# Patient Record
Sex: Male | Born: 1955
Health system: Southern US, Community
[De-identification: ages and names within clinical notes are randomized; demographics above are authoritative.]

## PROBLEM LIST (undated history)

## (undated) DIAGNOSIS — C801 Malignant (primary) neoplasm, unspecified: Secondary | ICD-10-CM

## (undated) DIAGNOSIS — I10 Essential (primary) hypertension: Secondary | ICD-10-CM

## (undated) DIAGNOSIS — I2510 Atherosclerotic heart disease of native coronary artery without angina pectoris: Secondary | ICD-10-CM

---

## 2007-02-25 ENCOUNTER — Ambulatory Visit: Payer: Self-pay | Admitting: Internal Medicine

## 2007-04-11 ENCOUNTER — Ambulatory Visit: Payer: Self-pay | Admitting: Internal Medicine

## 2007-04-12 ENCOUNTER — Inpatient Hospital Stay: Payer: Self-pay | Admitting: Internal Medicine

## 2007-04-13 ENCOUNTER — Other Ambulatory Visit: Payer: Self-pay

## 2008-04-19 HISTORY — PX: CORONARY ARTERY BYPASS GRAFT: SHX141

## 2012-04-18 DIAGNOSIS — H47013 Ischemic optic neuropathy, bilateral: Secondary | ICD-10-CM | POA: Insufficient documentation

## 2014-01-24 DIAGNOSIS — I25810 Atherosclerosis of coronary artery bypass graft(s) without angina pectoris: Secondary | ICD-10-CM | POA: Insufficient documentation

## 2014-01-24 DIAGNOSIS — I38 Endocarditis, valve unspecified: Secondary | ICD-10-CM | POA: Insufficient documentation

## 2014-01-24 DIAGNOSIS — E785 Hyperlipidemia, unspecified: Secondary | ICD-10-CM | POA: Insufficient documentation

## 2014-01-24 DIAGNOSIS — I255 Ischemic cardiomyopathy: Secondary | ICD-10-CM | POA: Insufficient documentation

## 2014-01-24 DIAGNOSIS — E782 Mixed hyperlipidemia: Secondary | ICD-10-CM | POA: Insufficient documentation

## 2014-08-05 DIAGNOSIS — I10 Essential (primary) hypertension: Secondary | ICD-10-CM | POA: Insufficient documentation

## 2016-07-14 DIAGNOSIS — I25708 Atherosclerosis of coronary artery bypass graft(s), unspecified, with other forms of angina pectoris: Secondary | ICD-10-CM | POA: Diagnosis not present

## 2016-07-14 DIAGNOSIS — E782 Mixed hyperlipidemia: Secondary | ICD-10-CM | POA: Diagnosis not present

## 2016-07-14 DIAGNOSIS — I10 Essential (primary) hypertension: Secondary | ICD-10-CM | POA: Diagnosis not present

## 2016-07-29 DIAGNOSIS — I10 Essential (primary) hypertension: Secondary | ICD-10-CM | POA: Diagnosis not present

## 2016-07-29 DIAGNOSIS — Z79899 Other long term (current) drug therapy: Secondary | ICD-10-CM | POA: Diagnosis not present

## 2016-07-29 DIAGNOSIS — E782 Mixed hyperlipidemia: Secondary | ICD-10-CM | POA: Diagnosis not present

## 2016-08-03 DIAGNOSIS — E782 Mixed hyperlipidemia: Secondary | ICD-10-CM | POA: Diagnosis not present

## 2016-08-03 DIAGNOSIS — I10 Essential (primary) hypertension: Secondary | ICD-10-CM | POA: Diagnosis not present

## 2016-08-03 DIAGNOSIS — I25810 Atherosclerosis of coronary artery bypass graft(s) without angina pectoris: Secondary | ICD-10-CM | POA: Diagnosis not present

## 2017-01-12 DIAGNOSIS — I10 Essential (primary) hypertension: Secondary | ICD-10-CM | POA: Diagnosis not present

## 2017-01-12 DIAGNOSIS — I25810 Atherosclerosis of coronary artery bypass graft(s) without angina pectoris: Secondary | ICD-10-CM | POA: Diagnosis not present

## 2017-01-12 DIAGNOSIS — Z0000 Encounter for general adult medical examination without abnormal findings: Secondary | ICD-10-CM | POA: Diagnosis not present

## 2017-01-26 DIAGNOSIS — Z1211 Encounter for screening for malignant neoplasm of colon: Secondary | ICD-10-CM | POA: Diagnosis not present

## 2017-02-09 DIAGNOSIS — I10 Essential (primary) hypertension: Secondary | ICD-10-CM | POA: Diagnosis not present

## 2017-02-09 DIAGNOSIS — I25810 Atherosclerosis of coronary artery bypass graft(s) without angina pectoris: Secondary | ICD-10-CM | POA: Diagnosis not present

## 2017-02-09 DIAGNOSIS — I255 Ischemic cardiomyopathy: Secondary | ICD-10-CM | POA: Diagnosis not present

## 2017-02-11 DIAGNOSIS — Z79899 Other long term (current) drug therapy: Secondary | ICD-10-CM | POA: Diagnosis not present

## 2017-02-11 DIAGNOSIS — I10 Essential (primary) hypertension: Secondary | ICD-10-CM | POA: Diagnosis not present

## 2017-02-17 DIAGNOSIS — H527 Unspecified disorder of refraction: Secondary | ICD-10-CM | POA: Diagnosis not present

## 2017-02-17 DIAGNOSIS — H25093 Other age-related incipient cataract, bilateral: Secondary | ICD-10-CM | POA: Diagnosis not present

## 2017-02-17 DIAGNOSIS — H47013 Ischemic optic neuropathy, bilateral: Secondary | ICD-10-CM | POA: Diagnosis not present

## 2017-07-13 DIAGNOSIS — I10 Essential (primary) hypertension: Secondary | ICD-10-CM | POA: Diagnosis not present

## 2017-07-13 DIAGNOSIS — H47013 Ischemic optic neuropathy, bilateral: Secondary | ICD-10-CM | POA: Diagnosis not present

## 2017-07-13 DIAGNOSIS — I25810 Atherosclerosis of coronary artery bypass graft(s) without angina pectoris: Secondary | ICD-10-CM | POA: Diagnosis not present

## 2017-08-10 DIAGNOSIS — E782 Mixed hyperlipidemia: Secondary | ICD-10-CM | POA: Diagnosis not present

## 2017-08-10 DIAGNOSIS — Z79899 Other long term (current) drug therapy: Secondary | ICD-10-CM | POA: Diagnosis not present

## 2017-08-24 DIAGNOSIS — I10 Essential (primary) hypertension: Secondary | ICD-10-CM | POA: Diagnosis not present

## 2017-08-24 DIAGNOSIS — I25810 Atherosclerosis of coronary artery bypass graft(s) without angina pectoris: Secondary | ICD-10-CM | POA: Diagnosis not present

## 2017-08-24 DIAGNOSIS — I255 Ischemic cardiomyopathy: Secondary | ICD-10-CM | POA: Diagnosis not present

## 2018-01-18 DIAGNOSIS — Z0000 Encounter for general adult medical examination without abnormal findings: Secondary | ICD-10-CM | POA: Diagnosis not present

## 2018-01-18 DIAGNOSIS — I10 Essential (primary) hypertension: Secondary | ICD-10-CM | POA: Diagnosis not present

## 2018-01-18 DIAGNOSIS — I25810 Atherosclerosis of coronary artery bypass graft(s) without angina pectoris: Secondary | ICD-10-CM | POA: Diagnosis not present

## 2018-02-03 DIAGNOSIS — Z1211 Encounter for screening for malignant neoplasm of colon: Secondary | ICD-10-CM | POA: Diagnosis not present

## 2018-02-16 DIAGNOSIS — I10 Essential (primary) hypertension: Secondary | ICD-10-CM | POA: Diagnosis not present

## 2018-02-16 DIAGNOSIS — Z79899 Other long term (current) drug therapy: Secondary | ICD-10-CM | POA: Diagnosis not present

## 2018-02-21 DIAGNOSIS — E782 Mixed hyperlipidemia: Secondary | ICD-10-CM | POA: Diagnosis not present

## 2018-02-21 DIAGNOSIS — I10 Essential (primary) hypertension: Secondary | ICD-10-CM | POA: Diagnosis not present

## 2018-02-21 DIAGNOSIS — Z79899 Other long term (current) drug therapy: Secondary | ICD-10-CM | POA: Diagnosis not present

## 2018-02-23 DIAGNOSIS — H47013 Ischemic optic neuropathy, bilateral: Secondary | ICD-10-CM | POA: Diagnosis not present

## 2018-02-23 DIAGNOSIS — H2513 Age-related nuclear cataract, bilateral: Secondary | ICD-10-CM | POA: Diagnosis not present

## 2018-02-23 DIAGNOSIS — H47293 Other optic atrophy, bilateral: Secondary | ICD-10-CM | POA: Diagnosis not present

## 2018-08-16 DIAGNOSIS — E782 Mixed hyperlipidemia: Secondary | ICD-10-CM | POA: Diagnosis not present

## 2018-08-16 DIAGNOSIS — Z79899 Other long term (current) drug therapy: Secondary | ICD-10-CM | POA: Diagnosis not present

## 2018-08-23 DIAGNOSIS — I10 Essential (primary) hypertension: Secondary | ICD-10-CM | POA: Diagnosis not present

## 2018-08-23 DIAGNOSIS — I25810 Atherosclerosis of coronary artery bypass graft(s) without angina pectoris: Secondary | ICD-10-CM | POA: Diagnosis not present

## 2018-08-23 DIAGNOSIS — E782 Mixed hyperlipidemia: Secondary | ICD-10-CM | POA: Diagnosis not present

## 2018-08-24 ENCOUNTER — Other Ambulatory Visit: Payer: Self-pay | Admitting: Unknown Physician Specialty

## 2018-08-24 DIAGNOSIS — R221 Localized swelling, mass and lump, neck: Secondary | ICD-10-CM | POA: Diagnosis not present

## 2018-08-30 ENCOUNTER — Ambulatory Visit
Admission: RE | Admit: 2018-08-30 | Discharge: 2018-08-30 | Disposition: A | Discharge status: Home / Self Care | Payer: 59 | Source: Ambulatory Visit | Attending: Unknown Physician Specialty | Admitting: Unknown Physician Specialty

## 2018-08-30 ENCOUNTER — Other Ambulatory Visit: Payer: Self-pay

## 2018-08-30 DIAGNOSIS — R221 Localized swelling, mass and lump, neck: Secondary | ICD-10-CM | POA: Diagnosis present

## 2018-08-30 HISTORY — DX: Essential (primary) hypertension: I10

## 2018-08-30 MED ORDER — IOHEXOL 300 MG/ML  SOLN
75.0000 mL | Freq: Once | INTRAMUSCULAR | Status: AC | PRN
  Administered 2018-08-30: 75 mL via INTRAVENOUS

## 2018-08-31 ENCOUNTER — Other Ambulatory Visit: Payer: Self-pay | Admitting: Unknown Physician Specialty

## 2018-08-31 DIAGNOSIS — R221 Localized swelling, mass and lump, neck: Secondary | ICD-10-CM

## 2018-09-04 ENCOUNTER — Other Ambulatory Visit: Payer: Self-pay | Admitting: Student

## 2018-09-05 ENCOUNTER — Other Ambulatory Visit: Payer: Self-pay

## 2018-09-05 ENCOUNTER — Ambulatory Visit
Admission: RE | Admit: 2018-09-05 | Discharge: 2018-09-05 | Disposition: A | Discharge status: Home / Self Care | Payer: 59 | Source: Ambulatory Visit | Attending: Unknown Physician Specialty | Admitting: Unknown Physician Specialty

## 2018-09-05 ENCOUNTER — Other Ambulatory Visit: Payer: Self-pay | Admitting: Unknown Physician Specialty

## 2018-09-05 DIAGNOSIS — R221 Localized swelling, mass and lump, neck: Secondary | ICD-10-CM | POA: Diagnosis not present

## 2018-09-05 DIAGNOSIS — C770 Secondary and unspecified malignant neoplasm of lymph nodes of head, face and neck: Secondary | ICD-10-CM | POA: Diagnosis not present

## 2018-09-05 HISTORY — DX: Atherosclerotic heart disease of native coronary artery without angina pectoris: I25.10

## 2018-09-05 MED ORDER — SODIUM CHLORIDE 0.9 % IV SOLN: INTRAVENOUS | Status: DC

## 2018-09-05 NOTE — Procedures (Signed)
Interventional Radiology Procedure Note  Procedure: US guided FNA of left neck mass.  Multiple 25g bx, with cellular adequacy confirmed by cyto-tech. .  Complications: None  Recommendations:  - DC now - follow up path - routine wound care  Signed,  Dulcy Fanny. Earleen Newport, DO

## 2018-09-08 ENCOUNTER — Other Ambulatory Visit (HOSPITAL_COMMUNITY): Payer: Self-pay | Admitting: Unknown Physician Specialty

## 2018-09-08 ENCOUNTER — Other Ambulatory Visit: Payer: Self-pay | Admitting: Unknown Physician Specialty

## 2018-09-08 DIAGNOSIS — C4442 Squamous cell carcinoma of skin of scalp and neck: Secondary | ICD-10-CM

## 2018-09-12 LAB — CYTOLOGY - NON PAP

## 2018-09-14 ENCOUNTER — Encounter: Payer: Self-pay | Admitting: Oncology

## 2018-09-14 ENCOUNTER — Telehealth (HOSPITAL_COMMUNITY): Payer: Self-pay

## 2018-09-14 ENCOUNTER — Other Ambulatory Visit: Payer: Self-pay

## 2018-09-14 ENCOUNTER — Inpatient Hospital Stay: Payer: 59 | Attending: Oncology | Admitting: Oncology

## 2018-09-14 VITALS — BP 156/97 | HR 67 | Temp 98.7°F | Ht 69.0 in | Wt 219.0 lb

## 2018-09-14 DIAGNOSIS — C760 Malignant neoplasm of head, face and neck: Secondary | ICD-10-CM | POA: Diagnosis not present

## 2018-09-14 DIAGNOSIS — Z87891 Personal history of nicotine dependence: Secondary | ICD-10-CM

## 2018-09-14 DIAGNOSIS — I2510 Atherosclerotic heart disease of native coronary artery without angina pectoris: Secondary | ICD-10-CM | POA: Diagnosis not present

## 2018-09-14 DIAGNOSIS — Z8261 Family history of arthritis: Secondary | ICD-10-CM | POA: Diagnosis not present

## 2018-09-14 DIAGNOSIS — C4442 Squamous cell carcinoma of skin of scalp and neck: Secondary | ICD-10-CM

## 2018-09-14 DIAGNOSIS — Z808 Family history of malignant neoplasm of other organs or systems: Secondary | ICD-10-CM | POA: Insufficient documentation

## 2018-09-14 DIAGNOSIS — I700 Atherosclerosis of aorta: Secondary | ICD-10-CM | POA: Insufficient documentation

## 2018-09-14 DIAGNOSIS — Z79899 Other long term (current) drug therapy: Secondary | ICD-10-CM | POA: Diagnosis not present

## 2018-09-14 DIAGNOSIS — Z8249 Family history of ischemic heart disease and other diseases of the circulatory system: Secondary | ICD-10-CM | POA: Diagnosis not present

## 2018-09-14 DIAGNOSIS — I639 Cerebral infarction, unspecified: Secondary | ICD-10-CM | POA: Insufficient documentation

## 2018-09-14 NOTE — Progress Notes (Signed)
[place]  [phone] Fax:(336) [phone]  ID: [name] OB: [birth year]  MR#: [id number]  GUY#:[id number]  Patient Care Team: Idelle Crouch, MD as PCP - General (Internal Medicine)  CHIEF COMPLAINT: Squamous cell carcinoma of the head and neck.  INTERVAL HISTORY: Patient is a 63 year old male who recently noted enlarging lymph node on left side of his neck.  It did not resolve with antibiotics and he was subsequently sent to ENT for further evaluation.  Biopsy confirmed squamous cell carcinoma.  He is anxious, but otherwise feels well.  He denies any dysphasia.  He has no neurologic complaints.  He denies any recent fevers or illnesses.  He has a good appetite and denies weight loss.  He has no chest pain, shortness of breath, cough, or hemoptysis.  He denies any nausea, vomiting, constipation, or diarrhea.  He has no urinary complaints.  Patient otherwise feels well and offers no further specific complaints today.  REVIEW OF SYSTEMS:   Review of Systems  Constitutional: Negative.  Negative for fever, malaise/fatigue and weight loss.  Respiratory: Negative.  Negative for cough, hemoptysis and shortness of breath.   Cardiovascular: Negative.  Negative for leg swelling.  Gastrointestinal: Negative.  Negative for abdominal pain.  Genitourinary: Negative.  Negative for dysuria.  Musculoskeletal: Negative.  Negative for neck pain.  Skin: Negative.  Negative for rash.  Neurological: Negative.  Negative for dizziness, focal weakness, weakness and headaches.  Psychiatric/Behavioral: The patient is nervous/anxious.     As per HPI. Otherwise, a complete review of systems is negative.  PAST MEDICAL HISTORY: Past Medical History:  Diagnosis Date   Coronary artery disease    Hypertension     PAST SURGICAL HISTORY: Past Surgical History:  Procedure Laterality Date   CORONARY ARTERY BYPASS GRAFT  2010   Quadruple    FAMILY HISTORY: Family History   Problem Relation Age of Onset   Arthritis Mother    Heart attack Father    Brain cancer Sister     ADVANCED DIRECTIVES (Y/N):  N  HEALTH MAINTENANCE: Social History   Tobacco Use   Smoking status: Former Smoker    Types: Cigarettes    Last attempt to quit: 10/17/2008    Years since quitting: 9.9   Smokeless tobacco: Never Used  Substance Use Topics   Alcohol use: Yes    Comment: occasionally   Drug use: Never     Colonoscopy:  PAP:  Bone density:  Lipid panel:  No Known Allergies  Current Outpatient Medications  Medication Sig Dispense Refill   ascorbic acid (VITAMIN C) 1000 MG tablet Take 1 tablet by mouth.     aspirin 325 MG tablet Take 325 mg by mouth daily.     atorvastatin (LIPITOR) 80 MG tablet Take 1 tablet by mouth 1 day or 1 dose.     DENTA 5000 PLUS 1.1 % CREA dental cream BRUSH WITH PASTE 2 3 TIMES PER DAY FOR AT LEAST 1 MINUTE     lovastatin (MEVACOR) 40 MG tablet Take 40 mg by mouth at bedtime.     metoprolol succinate (TOPROL-XL) 25 MG 24 hr tablet Take 1 tablet by mouth 1 day or 1 dose.     olmesartan-hydrochlorothiazide (BENICAR HCT) 40-12.5 MG tablet Take 1 tablet by mouth 1 day or 1 dose.     No current facility-administered medications for this visit.     OBJECTIVE: Vitals:   09/14/18 1342  BP: (!) 156/97  Pulse: 67  Temp: 98.7 F (37.1  C)     Body mass index is 32.34 kg/m.    ECOG FS:0 - Asymptomatic  General: Well-developed, well-nourished, no acute distress. Eyes: Pink conjunctiva, anicteric sclera. HEENT: Normocephalic, moist mucous membranes, clear oropharnyx.  Easily palpable left cervical lymph node. Lungs: Clear to auscultation bilaterally. Heart: Regular rate and rhythm. No rubs, murmurs, or gallops. Abdomen: Soft, nontender, nondistended. No organomegaly noted, normoactive bowel sounds. Musculoskeletal: No edema, cyanosis, or clubbing. Neuro: Alert, answering all questions appropriately. Cranial nerves grossly  intact. Skin: No rashes or petechiae noted. Psych: Normal affect. Lymphatics: No calvicular, axillary or inguinal LAD.   LAB RESULTS:  No results found for: NA, K, CL, CO2, GLUCOSE, BUN, CREATININE, CALCIUM, PROT, ALBUMIN, AST, ALT, ALKPHOS, BILITOT, GFRNONAA, GFRAA  No results found for: WBC, NEUTROABS, HGB, HCT, MCV, PLT   STUDIES: Ct Soft Tissue Neck W Contrast  Result Date: 08/30/2018 CLINICAL DATA:  LEFT-sided neck mass. EXAM: CT NECK WITH CONTRAST TECHNIQUE: Multidetector CT imaging of the neck was performed using the standard protocol following the bolus administration of intravenous contrast. CONTRAST:  65mL OMNIPAQUE IOHEXOL 300 MG/ML  SOLN COMPARISON:  None. FINDINGS: Pharynx and larynx: Mass effect on the LEFT oropharynx/hypopharynx due to the large LEFT neck mass, but a mucosal lesion is not established. Normal larynx. Salivary glands: No inflammation, mass, or stone. Thyroid: Normal. Lymph nodes: Pathologically enlarged LEFT level 2A lymph node with central necrosis, 32 x 41 mm cross-section, series 2, image 56, punctate foci of calcification, most consistent with metastatic disease. Adjacent abnormal, but noncalcified, LEFT level III nodes measure 18 mm and 7 mm short axis, respectively. Vascular: Carotid bifurcation atherosclerosis, greater on the LEFT. Limited intracranial: Negative. Visualized orbits: Negative. Mastoids and visualized paranasal sinuses: No acute findings. Skeleton: Spondylosis, most pronounced at C5-6 and C6-7. Upper chest: Prior CABG. Aortic atherosclerosis. No lung mass or pneumothorax. No mediastinal adenopathy. Other: None. IMPRESSION: Pathologically enlarged LEFT level 2 neck lymph node with central necrosis, and punctate calcifications, 32 x 41 mm cross-section, most consistent with metastatic disease; statistically, squamous cell carcinoma. A primary pharyngeal lesion is not visible. Smaller noncalcified nodes are adjacent, LEFT level III. Punctate  calcifications are not typical for squamous cell carcinoma, more often seen in metastatic thyroid cancer; however no thyroid abnormality is identified. An unusual infection, such as TB or other granulomatous process could also have this appearance. Tissue sampling is warranted. Electronically Signed   By: Staci Righter M.D.   On: 08/30/2018 15:51   [place] Fna Salivary Gland/parotid Gland  Result Date: 09/05/2018 INDICATION: 63 year old male with a history of lymphadenopathy and neck mass with concern for head neck carcinoma EXAM: Ultrasound-guided biopsy left neck mass MEDICATIONS: None. ANESTHESIA/SEDATION: None FLUOROSCOPY TIME:  None COMPLICATIONS: None PROCEDURE: The procedure, risks, benefits, and alternatives were explained to the patient. Questions regarding the procedure were encouraged and answered. The patient understands and consents to the procedure. Ultrasound survey was performed with images stored and sent to PACs. The left neck was prepped with chlorhexidine in a sterile fashion, and a sterile drape was applied covering the operative field. A sterile gown and sterile gloves were used for the procedure. Local anesthesia was provided with 1% Lidocaine. Ultrasound guidance was used to infiltrate the region with 1% lidocaine for local anesthesia. Four separate 25 gauge fine needle biopsy were then acquired of the left neck mass using ultrasound guidance. Images were stored. Slide preparation was performed, and cellular adequacy was confirmed in the room by the cytotechnologist. Final image was stored after biopsy.  Patient tolerated the procedure well and remained hemodynamically stable throughout. No complications were encountered and no significant blood loss was encounter IMPRESSION: Status post ultrasound-guided biopsy of left neck mass. Signed, Dulcy Fanny. Dellia Nims, RPVI [facility] Radiology Electronically Signed   By: Corrie Mckusick D.O.   On:  09/05/2018 11:18    ASSESSMENT: Squamous cell carcinoma of the head and neck.  PLAN:    1. Squamous cell carcinoma of the head and neck: Pathology results reviewed confirming malignancy, but not enough tissue to assess p16 status.  Patient has agreed to a second ultrasound-guided biopsy.  CT scan results from Aug 30, 2018 reviewed independently and reported as above with enlarged cervical lymph node and no obvious primary.  PET scan is scheduled for September 18, 2018.  Patient will return to clinic in 1 week, after his PET scan and second biopsy, to discuss the results and treatment planning.  He will also have consultation with radiation oncology at that time.  Given his positive lymphadenopathy, patient will likely benefit from concurrent XRT along with weekly cisplatin.  I spent a total of 60 minutes face-to-face with the patient of which greater than 50% of the visit was spent in counseling and coordination of care as detailed above.   Patient expressed understanding and was in agreement with this plan. He also understands that He can call clinic at any time with any questions, concerns, or complaints.   Cancer Staging No matching staging information was found for the patient.  Lloyd Huger, MD   09/15/2018 9:47 AM

## 2018-09-14 NOTE — Progress Notes (Signed)
Patient is here today to establish care for squamous cell carcinoma of skin of scalp and neck, left. Patient is scheduled to have a PET scan on 09/18/2018. Patient stated that at the beginning of the year he had some swelling on his neck lymph nodes. Patient saw his PCP and was given antibiotics but things got better. However However, his left side of his neck the swelling did not go down. He was referred to ENT and had a CT Scan done and then a biopsy.

## 2018-09-15 ENCOUNTER — Other Ambulatory Visit: Payer: Self-pay | Admitting: Oncology

## 2018-09-15 DIAGNOSIS — C099 Malignant neoplasm of tonsil, unspecified: Secondary | ICD-10-CM | POA: Insufficient documentation

## 2018-09-15 NOTE — Progress Notes (Signed)
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   INTERVAL HISTORY: Patient returns to clinic today to discuss his imaging results and treatment planning.  He continues to be anxious, but otherwise feels well.  He denies any dysphasia.  He has no neurologic complaints.  He denies any recent fevers or illnesses.  He has a good appetite and denies weight loss.  He has no chest pain, shortness of breath, cough, or hemoptysis.  He denies any nausea, vomiting, constipation, or diarrhea.  He has no urinary complaints.  Patient offers no specific complaints today.  REVIEW OF SYSTEMS:   Review of Systems  Constitutional: Negative.  Negative for fever, malaise/fatigue and weight loss.  Respiratory: Negative.  Negative for cough, hemoptysis and shortness of breath.   Cardiovascular: Negative.  Negative for leg swelling.  Gastrointestinal: Negative.  Negative for abdominal pain.  Genitourinary: Negative.  Negative for dysuria.  Musculoskeletal: Negative.  Negative for neck pain.  Skin: Negative.  Negative for rash.  Neurological: Negative.  Negative for dizziness, focal weakness, weakness and headaches.  Psychiatric/Behavioral: The patient is nervous/anxious.     As per HPI. Otherwise, a complete review of systems is negative.  PAST MEDICAL HISTORY: Past Medical History:  Diagnosis Date   Coronary artery disease    Hypertension     PAST SURGICAL HISTORY: Past Surgical History:  Procedure Laterality Date   CORONARY ARTERY BYPASS GRAFT  2010   Quadruple    FAMILY HISTORY: Family History  Problem Relation Age of Onset   Arthritis Mother    Heart attack Father    Brain cancer Sister     ADVANCED DIRECTIVES (Y/N):  N  HEALTH  MAINTENANCE: Social History   Tobacco Use   Smoking status: Former Smoker    Types: Cigarettes    Last attempt to quit: 10/17/2008    Years since quitting: 9.9   Smokeless tobacco: Never Used  Substance Use Topics   Alcohol use: Yes    Comment: occasionally   Drug use: Never     Colonoscopy:  PAP:  Bone density:  Lipid panel:  No Known Allergies  Current Outpatient Medications  Medication Sig Dispense Refill   amLODipine (NORVASC) 10 MG tablet      ascorbic acid (VITAMIN C) 1000 MG tablet Take 1 tablet by mouth.     aspirin 325 MG tablet Take 325 mg by mouth daily.     atorvastatin (LIPITOR) 80 MG tablet Take 1 tablet by mouth 1 day or 1 dose.     DENTA 5000 PLUS 1.1 % CREA dental cream BRUSH WITH PASTE 2 3 TIMES PER DAY FOR AT LEAST 1 MINUTE     lovastatin (MEVACOR) 40 MG tablet Take 40 mg by mouth at bedtime.     metoprolol succinate (TOPROL-XL) 25 MG 24 hr tablet Take 1 tablet by mouth 1 day or 1 dose.     olmesartan-hydrochlorothiazide (BENICAR HCT) 40-12.5 MG tablet Take 1 tablet by mouth 1 day or 1 dose.     No current facility-administered medications for this visit.     OBJECTIVE: Vitals:   09/22/18 0941  BP: (!) 162/91  Pulse: (!) 56  Temp: (!) 97.5 F (36.4 C)     Body mass index is 32.49 kg/m.    ECOG FS:0 -  Asymptomatic  General: Well-developed, well-nourished, no acute distress. Eyes: Pink conjunctiva, anicteric sclera. HEENT: Normocephalic, moist mucous membranes, clear oropharnyx.  Easily palpable left cervical lymph node. Lungs: Clear to auscultation bilaterally. Heart: Regular rate and rhythm. No rubs, murmurs, or gallops. Abdomen: Soft, nontender, nondistended. No organomegaly noted, normoactive bowel sounds. Musculoskeletal: No edema, cyanosis, or clubbing. Neuro: Alert, answering all questions appropriately. Cranial nerves grossly intact. Skin: No rashes or petechiae noted. Psych: Normal affect.   LAB RESULTS:  No results found  for: NA, K, CL, CO2, GLUCOSE, BUN, CREATININE, CALCIUM, PROT, ALBUMIN, AST, ALT, ALKPHOS, BILITOT, GFRNONAA, GFRAA  No results found for: WBC, NEUTROABS, HGB, HCT, MCV, PLT   STUDIES: Ct Soft Tissue Neck W Contrast  Result Date: 08/30/2018 CLINICAL DATA:  LEFT-sided neck mass. EXAM: CT NECK WITH CONTRAST TECHNIQUE: Multidetector CT imaging of the neck was performed using the standard protocol following the bolus administration of intravenous contrast. CONTRAST:  44mL OMNIPAQUE IOHEXOL 300 MG/ML  SOLN COMPARISON:  None. FINDINGS: Pharynx and larynx: Mass effect on the LEFT oropharynx/hypopharynx due to the large LEFT neck mass, but a mucosal lesion is not established. Normal larynx. Salivary glands: No inflammation, mass, or stone. Thyroid: Normal. Lymph nodes: Pathologically enlarged LEFT level 2A lymph node with central necrosis, 32 x 41 mm cross-section, series 2, image 56, punctate foci of calcification, most consistent with metastatic disease. Adjacent abnormal, but noncalcified, LEFT level III nodes measure 18 mm and 7 mm short axis, respectively. Vascular: Carotid bifurcation atherosclerosis, greater on the LEFT. Limited intracranial: Negative. Visualized orbits: Negative. Mastoids and visualized paranasal sinuses: No acute findings. Skeleton: Spondylosis, most pronounced at C5-6 and C6-7. Upper chest: Prior CABG. Aortic atherosclerosis. No lung mass or pneumothorax. No mediastinal adenopathy. Other: None. IMPRESSION: Pathologically enlarged LEFT level 2 neck lymph node with central necrosis, and punctate calcifications, 32 x 41 mm cross-section, most consistent with metastatic disease; statistically, squamous cell carcinoma. A primary pharyngeal lesion is not visible. Smaller noncalcified nodes are adjacent, LEFT level III. Punctate calcifications are not typical for squamous cell carcinoma, more often seen in metastatic thyroid cancer; however no thyroid abnormality is identified. An unusual  infection, such as TB or other granulomatous process could also have this appearance. Tissue sampling is warranted. Electronically Signed   By: Staci Righter M.D.   On: 08/30/2018 15:51   Nm Pet Image Initial (pi) Skull Base To Thigh  Result Date: 09/18/2018 CLINICAL DATA:  Initial treatment strategy for LEFT neck mass. Squamous cell carcinoma EXAM: NUCLEAR MEDICINE PET SKULL BASE TO THIGH TECHNIQUE: 11.5 mCi F-18 FDG was injected intravenously. Full-ring PET imaging was performed from the skull base to thigh after the radiotracer. CT data was obtained and used for attenuation correction and anatomic localization. Fasting blood glucose: 117 mg/dl COMPARISON:  Neck CT 08/30/2018 FINDINGS: Mediastinal blood pool activity: SUV max Liver activity: SUV max NA NECK: Necrotic lymph node in the LEFT level 2 neck measures 3.3 by 3.7 cm. This node is intensely hypermetabolic along the medial border with SUV max equal 4.9. More inferiorly a second LEFT level 2 lymph node is solid measuring 14 mm with SUV max equal 9.1. THERE IS ASYMMETRIC HYPERMETABOLIC ACTIVITY IN THE POSTERIOR OROPHARYNX ACTIVITY LOCALIZES TO the region of the LEFT PALATINE TONSIL (IMAGE 36). ACTIVITY IS INTENSE WITH SUV MAX EQUAL 9.1. There is activity is same level within the RIGHT posterior oropharynx but less intense (SUV max equal 5.3). Activity of both these lesions is confined to the pharyngeal mucosa. NO HYPERMETABOLIC RIGHT CERVICAL  LYMPH NODES. Incidental CT findings: none CHEST: No hypermetabolic mediastinal or hilar nodes. No suspicious pulmonary nodules on the CT scan. Incidental CT findings: Post CABG ABDOMEN/PELVIS: No abnormal hypermetabolic activity within the liver, pancreas, adrenal glands, or spleen. No hypermetabolic lymph nodes in the abdomen or pelvis. Incidental CT findings: Atherosclerotic calcification of the aorta. Prostate normal SKELETON: No focal hypermetabolic activity to suggest skeletal metastasis. Incidental CT findings:  none IMPRESSION: 1. Two hypermetabolic lymph nodes in the LEFT level 2 cervical chain consistent with metastatic adenopathy. 2. Focus of intense asymmetric hypermetabolic activity in the LEFT posterior pharynx localizing to region of the palatine tonsil concerning for primary head neck carcinoma. Activity confined to the pharyngeal mucosa. Similar less intense activity at the same level in the RIGHT posterior pharynx is indeterminate. 3. No contralateral RIGHT cervical adenopathy. 4. No evidence distant metastatic disease. Electronically Signed   By: Suzy Bouchard M.D.   On: 09/18/2018 11:46   [place] Fna Salivary Gland/parotid Gland  Result Date: 09/05/2018 INDICATION: 63 year old male with a history of lymphadenopathy and neck mass with concern for head neck carcinoma EXAM: Ultrasound-guided biopsy left neck mass MEDICATIONS: None. ANESTHESIA/SEDATION: None FLUOROSCOPY TIME:  None COMPLICATIONS: None PROCEDURE: The procedure, risks, benefits, and alternatives were explained to the patient. Questions regarding the procedure were encouraged and answered. The patient understands and consents to the procedure. Ultrasound survey was performed with images stored and sent to PACs. The left neck was prepped with chlorhexidine in a sterile fashion, and a sterile drape was applied covering the operative field. A sterile gown and sterile gloves were used for the procedure. Local anesthesia was provided with 1% Lidocaine. Ultrasound guidance was used to infiltrate the region with 1% lidocaine for local anesthesia. Four separate 25 gauge fine needle biopsy were then acquired of the left neck mass using ultrasound guidance. Images were stored. Slide preparation was performed, and cellular adequacy was confirmed in the room by the cytotechnologist. Final image was stored after biopsy. Patient tolerated the procedure well and remained hemodynamically stable throughout. No complications were encountered and no significant blood  loss was encounter IMPRESSION: Status post ultrasound-guided biopsy of left neck mass. Signed, Dulcy Fanny. Dellia Nims, RPVI [facility] Radiology Electronically Signed   By: Corrie Mckusick D.O.   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   PLAN:    1. 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  Repeat biopsy to assess for p16 status is on September 25, 2018.  Patient was also evaluated by radiation oncology today.  Plan is to initiate concurrent chemotherapy with weekly cisplatin 40 mg/m along with daily XRT on approximately October 04, 2018.  Return to clinic on that date to initiate cycle 1 of weekly cisplatin.    I spent a total of 30 minutes face-to-face with the patient of which greater than 50% of the visit was spent in counseling and coordination of care as detailed above.   Patient expressed understanding and was in agreement with this plan. He also understands that He can call clinic at any time with any questions, concerns, or complaints.   Cancer Staging No matching staging information was found for the patient.  Lloyd Huger, MD   09/23/2018 10:15 AM

## 2018-09-18 ENCOUNTER — Other Ambulatory Visit: Payer: Self-pay

## 2018-09-18 ENCOUNTER — Ambulatory Visit
Admission: RE | Admit: 2018-09-18 | Discharge: 2018-09-18 | Disposition: A | Discharge status: Home / Self Care | Payer: 59 | Source: Ambulatory Visit | Attending: Unknown Physician Specialty | Admitting: Unknown Physician Specialty

## 2018-09-18 DIAGNOSIS — C4442 Squamous cell carcinoma of skin of scalp and neck: Secondary | ICD-10-CM | POA: Diagnosis present

## 2018-09-18 LAB — GLUCOSE, CAPILLARY: Glucose-Capillary: 117 mg/dL — ABNORMAL HIGH (ref 70–99)

## 2018-09-18 MED ORDER — FLUDEOXYGLUCOSE F - 18 (FDG) INJECTION
11.5100 | Freq: Once | INTRAVENOUS | Status: AC | PRN
  Administered 2018-09-18: 11.51 via INTRAVENOUS

## 2018-09-19 ENCOUNTER — Other Ambulatory Visit: Payer: Self-pay | Admitting: Oncology

## 2018-09-19 NOTE — Addendum Note (Signed)
Addended by: Wayna Chalet on: 09/19/2018 02:45 PM   Modules accepted: Orders

## 2018-09-21 ENCOUNTER — Other Ambulatory Visit: Payer: 59

## 2018-09-21 ENCOUNTER — Other Ambulatory Visit: Payer: Self-pay

## 2018-09-22 ENCOUNTER — Inpatient Hospital Stay: Payer: 59 | Attending: Oncology | Admitting: Oncology

## 2018-09-22 ENCOUNTER — Ambulatory Visit
Admission: RE | Admit: 2018-09-22 | Discharge: 2018-09-22 | Disposition: A | Discharge status: Home / Self Care | Payer: 59 | Source: Ambulatory Visit | Attending: Radiation Oncology | Admitting: Radiation Oncology

## 2018-09-22 ENCOUNTER — Encounter: Payer: Self-pay | Admitting: Oncology

## 2018-09-22 ENCOUNTER — Encounter: Payer: Self-pay | Admitting: Radiation Oncology

## 2018-09-22 ENCOUNTER — Other Ambulatory Visit: Payer: Self-pay

## 2018-09-22 ENCOUNTER — Other Ambulatory Visit: Payer: Self-pay | Admitting: Student

## 2018-09-22 VITALS — BP 157/86 | HR 55 | Temp 97.5°F | Resp 16 | Wt 220.2 lb

## 2018-09-22 VITALS — BP 162/91 | HR 56 | Temp 97.5°F | Ht 69.0 in | Wt 220.0 lb

## 2018-09-22 DIAGNOSIS — R221 Localized swelling, mass and lump, neck: Secondary | ICD-10-CM | POA: Diagnosis not present

## 2018-09-22 DIAGNOSIS — Z8249 Family history of ischemic heart disease and other diseases of the circulatory system: Secondary | ICD-10-CM | POA: Diagnosis not present

## 2018-09-22 DIAGNOSIS — Z7982 Long term (current) use of aspirin: Secondary | ICD-10-CM | POA: Diagnosis not present

## 2018-09-22 DIAGNOSIS — I700 Atherosclerosis of aorta: Secondary | ICD-10-CM | POA: Diagnosis not present

## 2018-09-22 DIAGNOSIS — Z79899 Other long term (current) drug therapy: Secondary | ICD-10-CM | POA: Insufficient documentation

## 2018-09-22 DIAGNOSIS — I10 Essential (primary) hypertension: Secondary | ICD-10-CM | POA: Insufficient documentation

## 2018-09-22 DIAGNOSIS — R590 Localized enlarged lymph nodes: Secondary | ICD-10-CM | POA: Insufficient documentation

## 2018-09-22 DIAGNOSIS — Z5111 Encounter for antineoplastic chemotherapy: Secondary | ICD-10-CM | POA: Diagnosis not present

## 2018-09-22 DIAGNOSIS — C4442 Squamous cell carcinoma of skin of scalp and neck: Secondary | ICD-10-CM | POA: Diagnosis not present

## 2018-09-22 DIAGNOSIS — R7989 Other specified abnormal findings of blood chemistry: Secondary | ICD-10-CM | POA: Insufficient documentation

## 2018-09-22 DIAGNOSIS — C099 Malignant neoplasm of tonsil, unspecified: Secondary | ICD-10-CM | POA: Diagnosis present

## 2018-09-22 DIAGNOSIS — C760 Malignant neoplasm of head, face and neck: Secondary | ICD-10-CM

## 2018-09-22 DIAGNOSIS — C770 Secondary and unspecified malignant neoplasm of lymph nodes of head, face and neck: Secondary | ICD-10-CM | POA: Insufficient documentation

## 2018-09-22 DIAGNOSIS — Z87891 Personal history of nicotine dependence: Secondary | ICD-10-CM

## 2018-09-22 DIAGNOSIS — Z8261 Family history of arthritis: Secondary | ICD-10-CM | POA: Diagnosis not present

## 2018-09-22 DIAGNOSIS — I2510 Atherosclerotic heart disease of native coronary artery without angina pectoris: Secondary | ICD-10-CM | POA: Diagnosis not present

## 2018-09-22 DIAGNOSIS — Z808 Family history of malignant neoplasm of other organs or systems: Secondary | ICD-10-CM

## 2018-09-22 NOTE — Progress Notes (Signed)
Tumor Board Documentation  Scott Harding was presented by Dr Grayland Ormond at our Tumor Board on 09/21/2018, which included representatives from medical oncology, radiation oncology, surgical, radiology, pathology, navigation, internal medicine, research.  Scott Harding currently presents as a new patient, for discussion, for new positive pathology, for [place] with history of the following treatments: surgical intervention(s).  Additionally, we reviewed previous medical and familial history, history of present illness, and recent lab results along with all available histopathologic and imaging studies. The tumor board considered available treatment options and made the following recommendations: Biopsy, Concurrent chemo-radiation therapy send path for P16  The following procedures/referrals were also placed: No orders of the defined types were placed in this encounter.   Clinical Trial Status: not discussed   Staging used: AJCC Stage Group  AJCC Staging: T: Tx N: N2b   Group: Head and Neck cancer   National site-specific guidelines NCCN were discussed with respect to the case.  Tumor board is a meeting of clinicians from various specialty areas who evaluate and discuss patients for whom a multidisciplinary approach is being considered. Final determinations in the plan of care are those of the provider(s). The responsibility for follow up of recommendations given during tumor board is that of the provider.   Today's extended care, comprehensive team conference, Scott Harding was not present for the discussion and was not examined.   Multidisciplinary Tumor Board is a multidisciplinary case peer review process.  Decisions discussed in the Multidisciplinary Tumor Board reflect the opinions of the specialists present at the conference without having examined the patient.  Ultimately, treatment and diagnostic decisions rest with the primary provider(s) and the patient.

## 2018-09-22 NOTE — Progress Notes (Signed)
Patient is here today to discuss his treatment plan.

## 2018-09-22 NOTE — Consult Note (Signed)
NEW PATIENT EVALUATION  Name: Scott Harding  MRN: [id number]  Date:   09/22/2018     DOB: 1955-05-29   This 63 y.o. 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 Cancer    Initial consultation    DIAGNOSIS: The encounter diagnosis was Squamous cell carcinoma of head and neck ([place]).   PREVIOUS INVESTIGATIONS:  PET/CT scan and CT scans reviewed Pathology report reviewed Clinical notes reviewed  HPI: Patient is a 63 year old male longstanding history of sinus drainage causing sore throat.  He recently noted some bilateral enlarging nodes in his neck more accentuated on his left side.  He was on empiric antibiotic therapy right-sided adenopathy cleared.  He still had a sore throat was referred to ENT where he was noted to have adenopathy in his neck.  CT scan demonstrated enlarged left level 2 cervical lymph nodes with central necrosis consistent with metastatic disease.  Fine-needle aspiration was performed positive for squamous cell carcinoma not enough tissue for HPV evaluation.  He is scheduled for rebiopsy to obtain that information.  PET CT scan demonstrated to hypermetabolic lymph nodes in the left level 2 cervical chain consistent with metastatic disease.  There is also focal intense asymmetric hypermetabolic [place] in left posterior pharynx localizing to the region of the palate team tonsil concerning for primary head and neck cancer.  No evidence of distant disease was noted.  He has been seen by medical oncology will have a another discussion today about concurrent chemoradiation.  He is having no dysphasia or head and neck pain at this time.  PLANNED TREATMENT REGIMEN: Concurrent chemoradiation  PAST MEDICAL HISTORY:  has a past medical history of Coronary artery disease and Hypertension.     PAST SURGICAL HISTORY:  Past Surgical History:  Procedure Laterality Date  . CORONARY ARTERY BYPASS GRAFT  2010   Quadruple    FAMILY HISTORY: family history includes Arthritis in his mother; Brain cancer in his sister; Heart attack in his father.  SOCIAL HISTORY:  reports that he quit smoking about 9 years ago. His smoking use included cigarettes. He has never used smokeless tobacco. He reports current alcohol use. He reports that he does not use drugs.  ALLERGIES: Patient has no known allergies.  MEDICATIONS:  Current Outpatient Medications  Medication Sig Dispense Refill  . amLODipine (NORVASC) 10 MG tablet     . ascorbic acid (VITAMIN C) 1000 MG tablet Take 1 tablet by mouth.    Marland Kitchen aspirin 325 MG tablet Take 325 mg by mouth daily.    Marland Kitchen atorvastatin (LIPITOR) 80 MG tablet Take 1 tablet by mouth 1 day or 1 dose.    . DENTA 5000 PLUS 1.1 % CREA dental cream BRUSH WITH PASTE 2 3 TIMES PER DAY FOR AT LEAST 1 MINUTE    . lovastatin (MEVACOR) 40 MG tablet Take 40 mg by mouth at bedtime.    . metoprolol succinate (TOPROL-XL) 25 MG 24 hr tablet Take 1 tablet by mouth 1 day or 1 dose.    . olmesartan-hydrochlorothiazide (BENICAR HCT) 40-12.5 MG tablet Take 1 tablet by mouth 1 day or 1 dose.     No current facility-administered medications for this encounter.     ECOG PERFORMANCE STATUS:  0 - Asymptomatic  REVIEW OF SYSTEMS: Patient denies any weight loss, fatigue, weakness,  fever, chills or night sweats. Patient denies any loss of vision, blurred vision. Patient denies any ringing  of the ears or hearing loss. No irregular heartbeat. Patient denies heart murmur or history of fainting. Patient denies any chest pain or pain radiating to her upper extremities. Patient denies any shortness of breath, difficulty breathing at night, cough or hemoptysis. Patient denies any swelling in the lower legs. Patient denies any nausea vomiting, vomiting of blood, or coffee ground material in the vomitus.  Patient denies any stomach pain. Patient states has had normal bowel movements no significant constipation or diarrhea. Patient denies any dysuria, hematuria or significant nocturia. Patient denies any problems walking, swelling in the joints or loss of balance. Patient denies any skin changes, loss of hair or loss of weight. Patient denies any excessive worrying or anxiety or significant depression. Patient denies any problems with insomnia. Patient denies excessive thirst, polyuria, polydipsia. Patient denies any swollen glands, patient denies easy bruising or easy bleeding. Patient denies any recent infections, allergies or URI. Patient "s visual fields have not changed significantly in recent time.   PHYSICAL EXAM: BP (!) 157/86 (BP Location: Left Arm, Patient Position: Sitting)   Pulse (!) 55   Temp (!) 97.5 F (36.4 C) (Tympanic)   Resp 16   Wt 220 lb 3.8 oz (99.9 kg)   BMI 32.52 kg/m  On examination of the oral cavity there is some enlargement of the left tonsillar region.  No evidence of any other oral mucosal lesions are identified.  Neck shows adenopathy in the left mid cervical chain.  Right neck is clear.  No evidence of disease in the supraclavicular fossa is.  Well-developed well-nourished patient in NAD. HEENT reveals PERLA, EOMI, discs not visualized.  Oral cavity is clear. No oral mucosal lesions are identified. Neck is clear without evidence of cervical or supraclavicular adenopathy. Lungs are clear to A&P. Cardiac examination is essentially unremarkable with regular rate and rhythm without murmur rub or thrill. Abdomen is benign with no organomegaly or masses noted. Motor sensory and DTR levels are equal and symmetric in the upper and lower extremities. Cranial nerves II through XII are grossly intact. Proprioception is intact. No peripheral adenopathy or edema is identified. No motor or sensory levels are noted. Crude visual fields are within normal range.  LABORATORY DATA: Pathology  report reviewed    RADIOLOGY RESULTS: CT scan and PET CT scans reviewed   IMPRESSION: Stage III squamous cell carcinoma of the left tonsillar pillar in 63 year old male for concurrent chemoradiation.  PLAN: At this time elected go ahead with concurrent chemoradiation with curative intent.  Would plan on delivering 7000 cGy to the areas of hypermetabolic activity including the left [place] tonsillar region as well as left hypermetabolic cervical nodes.  I would treat remainder of neck nodes to 5400 cGy using IMRT treatment planning and delivery.  IMRT would be used to spare critical structures such as a salivary gland spine and esophagus.  Risks and benefits of treatment including altered taste sore throat dysphasia skin reaction fatigue alteration of blood counts all were discussed in detail with the patient.  He seems to comprehend our treatment plan well.  There will be extra effort by both professional staff as well as technical staff to coordinate and manage concurrent chemoradiation and ensuing side effects during his treatments.  I have personally set up and ordered CT simulation.  He has another biopsy next week to try to determine HPV status of the tumor.  I would  like to take this opportunity to thank you for allowing me to participate in the care of your patient.Noreene Filbert, MD

## 2018-09-23 MED ORDER — PROCHLORPERAZINE MALEATE 10 MG PO TABS: 10.0000 mg | ORAL_TABLET | Freq: Four times a day (QID) | ORAL | 1 refills | Status: DC | PRN

## 2018-09-23 MED ORDER — ONDANSETRON HCL 8 MG PO TABS: 8.0000 mg | ORAL_TABLET | Freq: Two times a day (BID) | ORAL | 1 refills | Status: DC | PRN

## 2018-09-23 NOTE — Progress Notes (Signed)
START ON PATHWAY REGIMEN - Head and Neck     A cycle is every 7 days:     Cisplatin   **Always confirm dose/schedule in your pharmacy ordering system**  Patient Characteristics: Oropharynx, HPV Negative/Unknown, Pathologically Staged, Stage III - IV, ENE or Positive Margins Disease Classification: Oropharynx HPV Status: Awaiting Test Results Current Disease Status: No Distant Metastases and No Recurrent Disease AJCC T Category: [place] AJCC 8 Stage Grouping: Unknown AJCC N Category: pN2a AJCC M Category: M0 Extranodal Extension and Margin Status: ENE or Positive Margins Intent of Therapy: Curative Intent, Discussed with Patient

## 2018-09-25 ENCOUNTER — Other Ambulatory Visit: Payer: Self-pay

## 2018-09-25 ENCOUNTER — Ambulatory Visit
Admission: RE | Admit: 2018-09-25 | Discharge: 2018-09-25 | Disposition: A | Discharge status: Home / Self Care | Payer: 59 | Source: Ambulatory Visit | Attending: Oncology | Admitting: Oncology

## 2018-09-25 DIAGNOSIS — C760 Malignant neoplasm of head, face and neck: Secondary | ICD-10-CM | POA: Insufficient documentation

## 2018-09-25 LAB — CBC
HCT: 42.5 % (ref 39.0–52.0)
Hemoglobin: 14.3 g/dL (ref 13.0–17.0)
MCH: 32.4 pg (ref 26.0–34.0)
MCHC: 33.6 g/dL (ref 30.0–36.0)
MCV: 96.4 fL (ref 80.0–100.0)
Platelets: 184 10*3/uL (ref 150–400)
RBC: 4.41 MIL/uL (ref 4.22–5.81)
RDW: 12.8 % (ref 11.5–15.5)
WBC: 7.2 10*3/uL (ref 4.0–10.5)
nRBC: 0 % (ref 0.0–0.2)

## 2018-09-25 LAB — PROTIME-INR
INR: 1 (ref 0.8–1.2)
Prothrombin Time: 12.7 seconds (ref 11.4–15.2)

## 2018-09-25 MED ORDER — SODIUM CHLORIDE 0.9 % IV SOLN: INTRAVENOUS | Status: DC

## 2018-09-25 NOTE — OR Nursing (Signed)
Pt declines sedation, will not start IV

## 2018-09-25 NOTE — Discharge Instructions (Signed)
Needle Biopsy, Care After Use Ice pack 5 minutes an hour for rest of day to decrease swelling and for pain control. Acetaminophin as needed for pain.    This sheet gives you information about how to care for yourself after your procedure. Your health care provider may also give you more specific instructions. If you have problems or questions, contact your health care provider. What can I expect after the procedure? After the procedure, it is common to have soreness, bruising, or mild pain at the puncture site. This should go away in a few days. Follow these instructions at home: Needle insertion site care   Wash your hands with soap and water before you change your bandage (dressing). If you cannot use soap and water, use hand sanitizer.  Follow instructions from your health care provider about how to take care of your puncture site. This includes: ? When and how to change your dressing. ? When to remove your dressing.  Check your puncture site every day for signs of infection. Check for: ? Redness, swelling, or pain. ? Fluid or blood. ? Pus or a bad smell. ? Warmth. General instructions  Return to your normal activities as told by your health care provider. Ask your health care provider what activities are safe for you.  Do not take baths, swim, or use a hot tub until your health care provider approves. Ask your health care provider if you may take showers. You may only be allowed to take sponge baths.  Take over-the-counter and prescription medicines only as told by your health care provider.  Keep all follow-up visits as told by your health care provider. This is important. Contact a health care provider if:  You have a fever.  You have redness, swelling, or pain at the puncture site that lasts longer than a few days.  You have fluid, blood, or pus coming from your puncture site.  Your puncture site feels warm to the touch. Get help right away if:  You have severe  bleeding from the puncture site. Summary  After the procedure, it is common to have soreness, bruising, or mild pain at the puncture site. This should go away in a few days.  Check your puncture site every day for signs of infection, such as redness, swelling, or pain.  Get help right away if you have severe bleeding from your puncture site. This information is not intended to replace advice given to you by your health care provider. Make sure you discuss any questions you have with your health care provider. Document Released: 08/20/2014 Document Revised: 04/18/2017 Document Reviewed: 04/18/2017 Elsevier Interactive Patient Education  2019 [employer].

## 2018-09-25 NOTE — Procedures (Signed)
Interventional Radiology Procedure Note  Procedure: US guided left cervical node biopsy.  Repeat for P16 lab.  Previous bx demo'd carcinoma.  Complications: None Recommendations:  - Ok to shower tomorrow - Do not submerge for 7 days - Routine care  - dc now  Signed,  Dulcy Fanny. Earleen Newport, DO

## 2018-09-26 ENCOUNTER — Ambulatory Visit
Admission: RE | Admit: 2018-09-26 | Discharge: 2018-09-26 | Disposition: A | Discharge status: Home / Self Care | Payer: 59 | Source: Ambulatory Visit | Attending: Radiation Oncology | Admitting: Radiation Oncology

## 2018-09-26 ENCOUNTER — Other Ambulatory Visit: Payer: Self-pay

## 2018-09-26 DIAGNOSIS — C770 Secondary and unspecified malignant neoplasm of lymph nodes of head, face and neck: Secondary | ICD-10-CM | POA: Diagnosis not present

## 2018-09-26 DIAGNOSIS — Z510 Encounter for antineoplastic radiation therapy: Secondary | ICD-10-CM | POA: Insufficient documentation

## 2018-09-26 DIAGNOSIS — C099 Malignant neoplasm of tonsil, unspecified: Secondary | ICD-10-CM | POA: Insufficient documentation

## 2018-09-26 NOTE — Patient Instructions (Signed)
Cisplatin injection  What is this medicine?  CISPLATIN (SIS pla tin) is a chemotherapy drug. It targets fast dividing cells, like cancer cells, and causes these cells to die. This medicine is used to treat many types of cancer like bladder, ovarian, and testicular cancers.  This medicine may be used for other purposes; ask your health care provider or pharmacist if you have questions.  COMMON BRAND NAME(S): Platinol, Platinol -AQ  What should I tell my health care provider before I take this medicine?  They need to know if you have any of these conditions:  -blood disorders  -hearing problems  -kidney disease  -recent or ongoing radiation therapy  -an unusual or allergic reaction to cisplatin, carboplatin, other chemotherapy, other medicines, foods, dyes, or preservatives  -pregnant or trying to get pregnant  -breast-feeding  How should I use this medicine?  This drug is given as an infusion into a vein. It is administered in a hospital or clinic by a specially trained health care professional.  Talk to your pediatrician regarding the use of this medicine in children. Special care may be needed.  Overdosage: If you think you have taken too much of this medicine contact a poison control center or emergency room at once.  NOTE: This medicine is only for you. Do not share this medicine with others.  What if I miss a dose?  It is important not to miss a dose. Call your doctor or health care professional if you are unable to keep an appointment.  What may interact with this medicine?  -dofetilide  -foscarnet  -medicines for seizures  -medicines to increase blood counts like filgrastim, pegfilgrastim, sargramostim  -probenecid  -pyridoxine used with altretamine  -rituximab  -some antibiotics like amikacin, gentamicin, neomycin, polymyxin B, streptomycin, tobramycin  -sulfinpyrazone  -vaccines  -zalcitabine  Talk to your doctor or health care professional before taking any of these  medicines:  -acetaminophen  -aspirin  -ibuprofen  -ketoprofen  -naproxen  This list may not describe all possible interactions. Give your health care provider a list of all the medicines, herbs, non-prescription drugs, or dietary supplements you use. Also tell them if you smoke, drink alcohol, or use illegal drugs. Some items may interact with your medicine.  What should I watch for while using this medicine?  Your condition will be monitored carefully while you are receiving this medicine. You will need important blood work done while you are taking this medicine.  This drug may make you feel generally unwell. This is not uncommon, as chemotherapy can affect healthy cells as well as cancer cells. Report any side effects. Continue your course of treatment even though you feel ill unless your doctor tells you to stop.  In some cases, you may be given additional medicines to help with side effects. Follow all directions for their use.  Call your doctor or health care professional for advice if you get a fever, chills or sore throat, or other symptoms of a cold or flu. Do not treat yourself. This drug decreases your body's ability to fight infections. Try to avoid being around people who are sick.  This medicine may increase your risk to bruise or bleed. Call your doctor or health care professional if you notice any unusual bleeding.  Be careful brushing and flossing your teeth or using a toothpick because you may get an infection or bleed more easily. If you have any dental work done, tell your dentist you are receiving this medicine.  Avoid taking products   that contain aspirin, acetaminophen, ibuprofen, naproxen, or ketoprofen unless instructed by your doctor. These medicines may hide a fever.  Do not become pregnant while taking this medicine. Women should inform their doctor if they wish to become pregnant or think they might be pregnant. There is a potential for serious side effects to an unborn child. Talk to  your health care professional or pharmacist for more information. Do not breast-feed an infant while taking this medicine.  Drink fluids as directed while you are taking this medicine. This will help protect your kidneys.  Call your doctor or health care professional if you get diarrhea. Do not treat yourself.  What side effects may I notice from receiving this medicine?  Side effects that you should report to your doctor or health care professional as soon as possible:  -allergic reactions like skin rash, itching or hives, swelling of the face, lips, or tongue  -signs of infection - fever or chills, cough, sore throat, pain or difficulty passing urine  -signs of decreased platelets or bleeding - bruising, pinpoint red spots on the skin, black, tarry stools, nosebleeds  -signs of decreased red blood cells - unusually weak or tired, fainting spells, lightheadedness  -breathing problems  -changes in hearing  -gout pain  -low blood counts - This drug may decrease the number of white blood cells, red blood cells and platelets. You may be at increased risk for infections and bleeding.  -nausea and vomiting  -pain, swelling, redness or irritation at the injection site  -pain, tingling, numbness in the hands or feet  -problems with balance, movement  -trouble passing urine or change in the amount of urine  Side effects that usually do not require medical attention (report to your doctor or health care professional if they continue or are bothersome):  -changes in vision  -loss of appetite  -metallic taste in the mouth or changes in taste  This list may not describe all possible side effects. Call your doctor for medical advice about side effects. You may report side effects to FDA at 1-800-FDA-1088.  Where should I keep my medicine?  This drug is given in a hospital or clinic and will not be stored at home.  NOTE: This sheet is a summary. It may not cover all possible information. If you have questions about this medicine,  talk to your doctor, pharmacist, or health care provider.   2019 Elsevier/Gold Standard (2007-07-11 14:40:54)

## 2018-09-27 LAB — SURGICAL PATHOLOGY

## 2018-09-28 DIAGNOSIS — C099 Malignant neoplasm of tonsil, unspecified: Secondary | ICD-10-CM | POA: Diagnosis not present

## 2018-09-29 ENCOUNTER — Other Ambulatory Visit: Payer: Self-pay

## 2018-10-01 NOTE — Progress Notes (Signed)
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   INTERVAL HISTORY: Patient returns to clinic today for further evaluation and initiation of cycle 1 of weekly cisplatin.  He currently feels well and is asymptomatic. He denies any dysphasia.  He has no neurologic complaints.  He denies any recent fevers or illnesses.  He has a good appetite and denies weight loss.  He has no chest pain, shortness of breath, cough, or hemoptysis.  He denies any nausea, vomiting, constipation, or diarrhea.  He has no urinary complaints.  Patient feels at his baseline offers no specific complaints today.  REVIEW OF SYSTEMS:   Review of Systems  Constitutional: Negative.  Negative for fever, malaise/fatigue and weight loss.  Respiratory: Negative.  Negative for cough, hemoptysis and shortness of breath.   Cardiovascular: Negative.  Negative for leg swelling.  Gastrointestinal: Negative.  Negative for abdominal pain.  Genitourinary: Negative.  Negative for dysuria.  Musculoskeletal: Negative.  Negative for neck pain.  Skin: Negative.  Negative for rash.  Neurological: Negative.  Negative for dizziness, focal weakness, weakness and headaches.  Psychiatric/Behavioral: Negative.  The patient is not nervous/anxious.     As per HPI. Otherwise, a complete review of systems is negative.  PAST MEDICAL HISTORY: Past Medical History:  Diagnosis Date   Coronary artery disease    Hypertension     PAST SURGICAL HISTORY: Past Surgical History:  Procedure Laterality Date   CORONARY ARTERY BYPASS GRAFT  2010   Quadruple    FAMILY HISTORY: Family History  Problem Relation Age of Onset   Arthritis Mother    Heart attack Father    Brain cancer Sister      ADVANCED DIRECTIVES (Y/N):  N  HEALTH MAINTENANCE: Social History   Tobacco Use   Smoking status: Former Smoker    Types: Cigarettes    Quit date: 10/17/2008    Years since quitting: 9.9   Smokeless tobacco: Never Used  Substance Use Topics   Alcohol use: Yes    Comment: occasionally   Drug use: Never     Colonoscopy:  PAP:  Bone density:  Lipid panel:  No Known Allergies  Current Outpatient Medications  Medication Sig Dispense Refill   amLODipine (NORVASC) 10 MG tablet      ascorbic acid (VITAMIN C) 1000 MG tablet Take 1 tablet by mouth.     aspirin 325 MG tablet Take 325 mg by mouth daily.     atorvastatin (LIPITOR) 80 MG tablet Take 1 tablet by mouth 1 day or 1 dose.     DENTA 5000 PLUS 1.1 % CREA dental cream BRUSH WITH PASTE 2 3 TIMES PER DAY FOR AT LEAST 1 MINUTE     lovastatin (MEVACOR) 40 MG tablet Take 40 mg by mouth at bedtime.     metoprolol succinate (TOPROL-XL) 25 MG 24 hr tablet Take 1 tablet by mouth 1 day or 1 dose.     olmesartan-hydrochlorothiazide (BENICAR HCT) 40-12.5 MG tablet Take 1 tablet by mouth 1 day or 1 dose.     ondansetron (ZOFRAN) 8 MG tablet Take 1 tablet (8 mg total) by mouth 2 (two) times daily as needed. 30 tablet 1   prochlorperazine (COMPAZINE) 10 MG tablet Take 1 tablet (10 mg total) by mouth every 6 (six) hours  as needed (Nausea or vomiting). 30 tablet 1   ALPRAZolam (XANAX) 0.5 MG tablet Take 1 tablet (0.5 mg total) by mouth daily as needed for anxiety. Take 30 minutes prior to radiation 30 tablet 0   No current facility-administered medications for this visit.     OBJECTIVE: Vitals:   10/04/18 0855  BP: (!) 155/85  Pulse: (!) 54  Temp: (!) 97.3 F (36.3 C)     Body mass index is 32.49 kg/m.    ECOG FS:0 - Asymptomatic  General: Well-developed, well-nourished, no acute distress. Eyes: Pink conjunctiva, anicteric sclera. HEENT: Normocephalic, moist mucous membranes, clear oropharnyx.  Easily palpable left  cervical lymph node. Lungs: Clear to auscultation bilaterally. Heart: Regular rate and rhythm. No rubs, murmurs, or gallops. Abdomen: Soft, nontender, nondistended. No organomegaly noted, normoactive bowel sounds. Musculoskeletal: No edema, cyanosis, or clubbing. Neuro: Alert, answering all questions appropriately. Cranial nerves grossly intact. Skin: No rashes or petechiae noted. Psych: Normal affect.  LAB RESULTS:  Lab Results  Component Value Date   NA 137 10/04/2018   K 4.6 10/04/2018   CL 100 10/04/2018   CO2 27 10/04/2018   GLUCOSE 112 (H) 10/04/2018   BUN 24 (H) 10/04/2018   CREATININE 1.21 10/04/2018   CALCIUM 9.1 10/04/2018   GFRNONAA >60 10/04/2018   GFRAA >60 10/04/2018    Lab Results  Component Value Date   WBC 7.5 10/04/2018   NEUTROABS 4.3 10/04/2018   HGB 13.2 10/04/2018   HCT 38.2 (L) 10/04/2018   MCV 95.7 10/04/2018   PLT 189 10/04/2018     STUDIES: Nm Pet Image Initial (pi) Skull Base To Thigh  Result Date: 09/18/2018 CLINICAL DATA:  Initial treatment strategy for LEFT neck mass. Squamous cell carcinoma EXAM: NUCLEAR MEDICINE PET SKULL BASE TO THIGH TECHNIQUE: 11.5 mCi F-18 FDG was injected intravenously. Full-ring PET imaging was performed from the skull base to thigh after the radiotracer. CT data was obtained and used for attenuation correction and anatomic localization. Fasting blood glucose: 117 mg/dl COMPARISON:  Neck CT 08/30/2018 FINDINGS: Mediastinal blood pool activity: SUV max Liver activity: SUV max NA NECK: Necrotic lymph node in the LEFT level 2 neck measures 3.3 by 3.7 cm. This node is intensely hypermetabolic along the medial border with SUV max equal 4.9. More inferiorly a second LEFT level 2 lymph node is solid measuring 14 mm with SUV max equal 9.1. THERE IS ASYMMETRIC HYPERMETABOLIC ACTIVITY IN THE POSTERIOR OROPHARYNX ACTIVITY LOCALIZES TO the region of the LEFT PALATINE TONSIL (IMAGE 36). ACTIVITY IS INTENSE WITH SUV MAX EQUAL 9.1. There  is activity is same level within the RIGHT posterior oropharynx but less intense (SUV max equal 5.3). Activity of both these lesions is confined to the pharyngeal mucosa. NO HYPERMETABOLIC RIGHT CERVICAL LYMPH NODES. Incidental CT findings: none CHEST: No hypermetabolic mediastinal or hilar nodes. No suspicious pulmonary nodules on the CT scan. Incidental CT findings: Post CABG ABDOMEN/PELVIS: No abnormal hypermetabolic activity within the liver, pancreas, adrenal glands, or spleen. No hypermetabolic lymph nodes in the abdomen or pelvis. Incidental CT findings: Atherosclerotic calcification of the aorta. Prostate normal SKELETON: No focal hypermetabolic activity to suggest skeletal metastasis. Incidental CT findings: none IMPRESSION: 1. Two hypermetabolic lymph nodes in the LEFT level 2 cervical chain consistent with metastatic adenopathy. 2. Focus of intense asymmetric hypermetabolic activity in the LEFT posterior pharynx localizing to region of the palatine tonsil concerning for primary head neck carcinoma. Activity confined to the pharyngeal mucosa. Similar less intense activity at the same level  in the RIGHT posterior pharynx is indeterminate. 3. No contralateral RIGHT cervical adenopathy. 4. No evidence distant metastatic disease. Electronically Signed   By: Suzy Bouchard M.D.   On: 09/18/2018 11:46   [place] Core Biopsy (lymph Nodes)  Result Date: 09/25/2018 INDICATION: 63 year old male with a history of cervical adenopathy, positive FDG activity on prior PET-CT, and prior biopsy demonstrating head and neck carcinoma. He has been referred for repeat biopsy for P 16 testing EXAM: ULTRASOUND-GUIDED BIOPSY LEFT CERVICAL ADENOPATHY MEDICATIONS: None. ANESTHESIA/SEDATION: None FLUOROSCOPY TIME:  Ultrasound COMPLICATIONS: None none PROCEDURE: Informed written consent was obtained from the patient after a thorough discussion of the procedural risks, benefits and alternatives. All questions were addressed. Maximal  Sterile Barrier Technique was utilized including caps, mask, sterile gowns, sterile gloves, sterile drape, hand hygiene and skin antiseptic. A timeout was performed prior to the initiation of the procedure. Ultrasound survey was performed with images stored and sent to PACs. The left neck was prepped with chlorhexidine in a sterile fashion, and a sterile drape was applied covering the operative field. A sterile gown and sterile gloves were used for the procedure. Local anesthesia was provided with 1% Lidocaine. Ultrasound guidance was used to infiltrate the region with 1% lidocaine for local anesthesia. Seven separate 18 gauge core biopsy were then acquired of the pathologic lymph node using ultrasound guidance. Images were stored. Final image was stored after biopsy. Patient tolerated the procedure well and remained hemodynamically stable throughout. No complications were encountered and no significant blood loss was encounter IMPRESSION: Status post ultrasound-guided biopsy of left cervical lymph node. Tissue specimen sent to pathology for additional testing. Signed, Dulcy Fanny. Dellia Nims, RPVI [facility] Electronically Signed   By: Corrie Mckusick D.O.   On: 09/25/2018 12:10   [place] Fna Salivary Gland/parotid Gland  Result Date: 09/05/2018 INDICATION: 63 year old male with a history of lymphadenopathy and neck mass with concern for head neck carcinoma EXAM: Ultrasound-guided biopsy left neck mass MEDICATIONS: None. ANESTHESIA/SEDATION: None FLUOROSCOPY TIME:  None COMPLICATIONS: None PROCEDURE: The procedure, risks, benefits, and alternatives were explained to the patient. Questions regarding the procedure were encouraged and answered. The patient understands and consents to the procedure. Ultrasound survey was performed with images stored and sent to PACs. The left neck was prepped with chlorhexidine in a sterile fashion, and a sterile drape was applied  covering the operative field. A sterile gown and sterile gloves were used for the procedure. Local anesthesia was provided with 1% Lidocaine. Ultrasound guidance was used to infiltrate the region with 1% lidocaine for local anesthesia. Four separate 25 gauge fine needle biopsy were then acquired of the left neck mass using ultrasound guidance. Images were stored. Slide preparation was performed, and cellular adequacy was confirmed in the room by the cytotechnologist. Final image was stored after biopsy. Patient tolerated the procedure well and remained hemodynamically stable throughout. No complications were encountered and no significant blood loss was encounter IMPRESSION: Status post ultrasound-guided biopsy of left neck mass. Signed, Dulcy Fanny. Dellia Nims, RPVI [facility] Electronically Signed   By: Corrie Mckusick D.O.   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   PLAN:    1. Stage IVa squamous cell carcinoma of the left tonsil: PET scan results reviewed independently and report as above indicating that this is likely left tonsillar primary, but size could not be determined so his staging remains  Tx.  Repeat biopsy confirmed P16+.  Patient will initiate XRT tomorrow.  Proceed with cycle 1 of weekly cisplatin.  Return to clinic in 1 week for further evaluation and consideration of cycle 2.  I spent a total of 30 minutes face-to-face with the patient of which greater than 50% of the visit was spent in counseling and coordination of care as detailed above.   Patient expressed understanding and was in agreement with this plan. He also understands that He can call clinic at any time with any questions, concerns, or complaints.   Cancer Staging No matching staging information was found for the patient.  Lloyd Huger, MD   10/05/2018 6:22 AM

## 2018-10-02 ENCOUNTER — Inpatient Hospital Stay: Payer: 59

## 2018-10-02 ENCOUNTER — Other Ambulatory Visit: Payer: Self-pay

## 2018-10-03 ENCOUNTER — Other Ambulatory Visit: Payer: Self-pay

## 2018-10-04 ENCOUNTER — Inpatient Hospital Stay: Payer: 59

## 2018-10-04 ENCOUNTER — Ambulatory Visit
Admission: RE | Admit: 2018-10-04 | Discharge: 2018-10-04 | Disposition: A | Discharge status: Home / Self Care | Payer: 59 | Source: Ambulatory Visit | Attending: Radiation Oncology | Admitting: Radiation Oncology

## 2018-10-04 ENCOUNTER — Inpatient Hospital Stay (HOSPITAL_BASED_OUTPATIENT_CLINIC_OR_DEPARTMENT_OTHER): Payer: 59 | Admitting: Oncology

## 2018-10-04 ENCOUNTER — Other Ambulatory Visit: Payer: Self-pay | Admitting: *Deleted

## 2018-10-04 ENCOUNTER — Encounter: Payer: Self-pay | Admitting: Oncology

## 2018-10-04 ENCOUNTER — Other Ambulatory Visit: Payer: Self-pay

## 2018-10-04 VITALS — BP 155/85 | HR 54 | Temp 97.3°F | Ht 69.0 in | Wt 220.0 lb

## 2018-10-04 DIAGNOSIS — R590 Localized enlarged lymph nodes: Secondary | ICD-10-CM | POA: Diagnosis not present

## 2018-10-04 DIAGNOSIS — C099 Malignant neoplasm of tonsil, unspecified: Secondary | ICD-10-CM | POA: Diagnosis not present

## 2018-10-04 DIAGNOSIS — I2510 Atherosclerotic heart disease of native coronary artery without angina pectoris: Secondary | ICD-10-CM | POA: Diagnosis not present

## 2018-10-04 DIAGNOSIS — Z808 Family history of malignant neoplasm of other organs or systems: Secondary | ICD-10-CM

## 2018-10-04 DIAGNOSIS — Z87891 Personal history of nicotine dependence: Secondary | ICD-10-CM

## 2018-10-04 DIAGNOSIS — Z5111 Encounter for antineoplastic chemotherapy: Secondary | ICD-10-CM | POA: Diagnosis not present

## 2018-10-04 DIAGNOSIS — Z7189 Other specified counseling: Secondary | ICD-10-CM

## 2018-10-04 DIAGNOSIS — Z8261 Family history of arthritis: Secondary | ICD-10-CM

## 2018-10-04 DIAGNOSIS — I700 Atherosclerosis of aorta: Secondary | ICD-10-CM

## 2018-10-04 DIAGNOSIS — Z79899 Other long term (current) drug therapy: Secondary | ICD-10-CM

## 2018-10-04 DIAGNOSIS — Z8249 Family history of ischemic heart disease and other diseases of the circulatory system: Secondary | ICD-10-CM

## 2018-10-04 LAB — CBC WITH DIFFERENTIAL/PLATELET
Abs Immature Granulocytes: 0.02 10*3/uL (ref 0.00–0.07)
Basophils Absolute: 0 10*3/uL (ref 0.0–0.1)
Basophils Relative: 0 %
Eosinophils Absolute: 0.3 10*3/uL (ref 0.0–0.5)
Eosinophils Relative: 4 %
HCT: 38.2 % — ABNORMAL LOW (ref 39.0–52.0)
Hemoglobin: 13.2 g/dL (ref 13.0–17.0)
Immature Granulocytes: 0 %
Lymphocytes Relative: 26 %
Lymphs Abs: 1.9 10*3/uL (ref 0.7–4.0)
MCH: 33.1 pg (ref 26.0–34.0)
MCHC: 34.6 g/dL (ref 30.0–36.0)
MCV: 95.7 fL (ref 80.0–100.0)
Monocytes Absolute: 0.9 10*3/uL (ref 0.1–1.0)
Monocytes Relative: 11 %
Neutro Abs: 4.3 10*3/uL (ref 1.7–7.7)
Neutrophils Relative %: 59 %
Platelets: 189 10*3/uL (ref 150–400)
RBC: 3.99 MIL/uL — ABNORMAL LOW (ref 4.22–5.81)
RDW: 12.4 % (ref 11.5–15.5)
WBC: 7.5 10*3/uL (ref 4.0–10.5)
nRBC: 0 % (ref 0.0–0.2)

## 2018-10-04 LAB — BASIC METABOLIC PANEL
Anion gap: 10 (ref 5–15)
BUN: 24 mg/dL — ABNORMAL HIGH (ref 8–23)
CO2: 27 mmol/L (ref 22–32)
Calcium: 9.1 mg/dL (ref 8.9–10.3)
Chloride: 100 mmol/L (ref 98–111)
Creatinine, Ser: 1.21 mg/dL (ref 0.61–1.24)
GFR calc Af Amer: >60 mL/min (ref >60)
GFR calc non Af Amer: >60 mL/min (ref >60)
Glucose, Bld: 112 mg/dL — ABNORMAL HIGH (ref 70–99)
Potassium: 4.6 mmol/L (ref 3.5–5.1)
Sodium: 137 mmol/L (ref 135–145)

## 2018-10-04 MED ORDER — PALONOSETRON HCL INJECTION 0.25 MG/5ML
0.2500 mg | Freq: Once | INTRAVENOUS | Status: AC
  Administered 2018-10-04: 0.25 mg via INTRAVENOUS
  Filled 2018-10-04: qty 5

## 2018-10-04 MED ORDER — SODIUM CHLORIDE 0.9 % IV SOLN
Freq: Once | INTRAVENOUS | Status: AC
  Administered 2018-10-04: 10:00:00 via INTRAVENOUS
  Filled 2018-10-04: qty 250

## 2018-10-04 MED ORDER — POTASSIUM CHLORIDE 2 MEQ/ML IV SOLN
Freq: Once | INTRAVENOUS | Status: AC
  Administered 2018-10-04: 11:00:00 via INTRAVENOUS
  Filled 2018-10-04: qty 1000

## 2018-10-04 MED ORDER — SODIUM CHLORIDE 0.9 % IV SOLN
40.0000 mg/m2 | Freq: Once | INTRAVENOUS | Status: AC
  Administered 2018-10-04: 88 mg via INTRAVENOUS
  Filled 2018-10-04: qty 88

## 2018-10-04 MED ORDER — SODIUM CHLORIDE 0.9 % IV SOLN
Freq: Once | INTRAVENOUS | Status: AC
  Administered 2018-10-04: 13:00:00 via INTRAVENOUS
  Filled 2018-10-04: qty 5

## 2018-10-04 MED ORDER — ALPRAZOLAM 0.5 MG PO TABS: 0.5000 mg | ORAL_TABLET | Freq: Every day | ORAL | 0 refills | Status: DC | PRN

## 2018-10-04 NOTE — Progress Notes (Signed)
Pt tolerated infusion well. Pt stable at discharge. 

## 2018-10-04 NOTE — Progress Notes (Signed)
Patient stated that he had been doing well with no complaints. 

## 2018-10-05 ENCOUNTER — Other Ambulatory Visit: Payer: Self-pay

## 2018-10-05 ENCOUNTER — Ambulatory Visit
Admission: RE | Admit: 2018-10-05 | Discharge: 2018-10-05 | Disposition: A | Discharge status: Home / Self Care | Payer: 59 | Source: Ambulatory Visit | Attending: Radiation Oncology | Admitting: Radiation Oncology

## 2018-10-05 DIAGNOSIS — Z7189 Other specified counseling: Secondary | ICD-10-CM | POA: Insufficient documentation

## 2018-10-05 DIAGNOSIS — C099 Malignant neoplasm of tonsil, unspecified: Secondary | ICD-10-CM | POA: Diagnosis not present

## 2018-10-06 ENCOUNTER — Ambulatory Visit
Admission: RE | Admit: 2018-10-06 | Discharge: 2018-10-06 | Disposition: A | Discharge status: Home / Self Care | Payer: 59 | Source: Ambulatory Visit | Attending: Radiation Oncology | Admitting: Radiation Oncology

## 2018-10-06 ENCOUNTER — Other Ambulatory Visit: Payer: Self-pay

## 2018-10-06 DIAGNOSIS — C099 Malignant neoplasm of tonsil, unspecified: Secondary | ICD-10-CM | POA: Diagnosis not present

## 2018-10-07 NOTE — Progress Notes (Signed)
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   INTERVAL HISTORY: Patient returns to clinic today for further evaluation and consideration of cycle 2 of weekly cisplatin.  He tolerated his first infusion well without significant side effects.  He currently feels well and is asymptomatic. He denies any dysphasia.  He has no neurologic complaints.  He denies any recent fevers or illnesses.  He has a good appetite and denies weight loss.  He has no chest pain, shortness of breath, cough, or hemoptysis.  He denies any nausea, vomiting, constipation, or diarrhea.  He has no urinary complaints.  Patient offers no specific complaints today.  REVIEW OF SYSTEMS:   Review of Systems  Constitutional: Negative.  Negative for fever, malaise/fatigue and weight loss.  Respiratory: Negative.  Negative for cough, hemoptysis and shortness of breath.   Cardiovascular: Negative.  Negative for leg swelling.  Gastrointestinal: Negative.  Negative for abdominal pain.  Genitourinary: Negative.  Negative for dysuria.  Musculoskeletal: Negative.  Negative for neck pain.  Skin: Negative.  Negative for rash.  Neurological: Negative.  Negative for dizziness, focal weakness, weakness and headaches.  Psychiatric/Behavioral: Negative.  The patient is not nervous/anxious.     As per HPI. Otherwise, a complete review of systems is negative.  PAST MEDICAL HISTORY: Past Medical History:  Diagnosis Date   Coronary artery disease    Hypertension     PAST SURGICAL HISTORY: Past Surgical History:  Procedure Laterality Date   CORONARY ARTERY BYPASS GRAFT  2010   Quadruple    FAMILY HISTORY: Family History  Problem Relation Age of Onset   Arthritis Mother     Heart attack Father    Brain cancer Sister     ADVANCED DIRECTIVES (Y/N):  N  HEALTH MAINTENANCE: Social History   Tobacco Use   Smoking status: Former Smoker    Types: Cigarettes    Quit date: 10/17/2008    Years since quitting: 9.9   Smokeless tobacco: Never Used  Substance Use Topics   Alcohol use: Yes    Comment: occasionally   Drug use: Never     Colonoscopy:  PAP:  Bone density:  Lipid panel:  No Known Allergies  Current Outpatient Medications  Medication Sig Dispense Refill   ALPRAZolam (XANAX) 0.5 MG tablet Take 1 tablet (0.5 mg total) by mouth daily as needed for anxiety. Take 30 minutes prior to radiation 30 tablet 0   amLODipine (NORVASC) 10 MG tablet      ascorbic acid (VITAMIN C) 1000 MG tablet Take 1 tablet by mouth.     aspirin 325 MG tablet Take 325 mg by mouth daily.     atorvastatin (LIPITOR) 80 MG tablet Take 1 tablet by mouth 1 day or 1 dose.     DENTA 5000 PLUS 1.1 % CREA dental cream BRUSH WITH PASTE 2 3 TIMES PER DAY FOR AT LEAST 1 MINUTE     lovastatin (MEVACOR) 40 MG tablet Take 40 mg by mouth at bedtime.     metoprolol succinate (TOPROL-XL) 25 MG 24 hr tablet Take 1 tablet by mouth 1 day or 1 dose.     olmesartan-hydrochlorothiazide (BENICAR HCT) 40-12.5 MG tablet Take 1 tablet by mouth 1 day or 1 dose.     ondansetron (ZOFRAN) 8 MG tablet  Take 1 tablet (8 mg total) by mouth 2 (two) times daily as needed. 30 tablet 1   prochlorperazine (COMPAZINE) 10 MG tablet Take 1 tablet (10 mg total) by mouth every 6 (six) hours as needed (Nausea or vomiting). 30 tablet 1   sucralfate (CARAFATE) 1 g tablet Take 1 tablet (1 g total) by mouth 3 (three) times daily before meals. Dissolve in warm water, gargle/swallow 90 tablet 6   No current facility-administered medications for this visit.     OBJECTIVE: Vitals:   10/11/18 0857  BP: (!) 151/80  Pulse: (!) 50  Temp: 98.3 F (36.8 C)     Body mass index is 32.64 kg/m.    ECOG FS:0 -  Asymptomatic  General: Well-developed, well-nourished, no acute distress. Eyes: Pink conjunctiva, anicteric sclera. HEENT: Normocephalic, moist mucous membranes, clear oropharnyx.  Easily palpable left cervical lymph node. Lungs: Clear to auscultation bilaterally. Heart: Regular rate and rhythm. No rubs, murmurs, or gallops. Abdomen: Soft, nontender, nondistended. No organomegaly noted, normoactive bowel sounds. Musculoskeletal: No edema, cyanosis, or clubbing. Neuro: Alert, answering all questions appropriately. Cranial nerves grossly intact. Skin: No rashes or petechiae noted. Psych: Normal affect.  LAB RESULTS:  Lab Results  Component Value Date   NA 138 10/11/2018   K 4.7 10/11/2018   CL 101 10/11/2018   CO2 27 10/11/2018   GLUCOSE 109 (H) 10/11/2018   BUN 55 (H) 10/11/2018   CREATININE 2.81 (H) 10/11/2018   CALCIUM 8.9 10/11/2018   GFRNONAA 23 (L) 10/11/2018   GFRAA 27 (L) 10/11/2018    Lab Results  Component Value Date   WBC 9.9 10/11/2018   NEUTROABS 6.7 10/11/2018   HGB 12.4 (L) 10/11/2018   HCT 36.5 (L) 10/11/2018   MCV 95.8 10/11/2018   PLT 156 10/11/2018     STUDIES: Nm Pet Image Initial (pi) Skull Base To Thigh  Result Date: 09/18/2018 CLINICAL DATA:  Initial treatment strategy for LEFT neck mass. Squamous cell carcinoma EXAM: NUCLEAR MEDICINE PET SKULL BASE TO THIGH TECHNIQUE: 11.5 mCi F-18 FDG was injected intravenously. Full-ring PET imaging was performed from the skull base to thigh after the radiotracer. CT data was obtained and used for attenuation correction and anatomic localization. Fasting blood glucose: 117 mg/dl COMPARISON:  Neck CT 08/30/2018 FINDINGS: Mediastinal blood pool activity: SUV max Liver activity: SUV max NA NECK: Necrotic lymph node in the LEFT level 2 neck measures 3.3 by 3.7 cm. This node is intensely hypermetabolic along the medial border with SUV max equal 4.9. More inferiorly a second LEFT level 2 lymph node is solid measuring 14 mm  with SUV max equal 9.1. THERE IS ASYMMETRIC HYPERMETABOLIC ACTIVITY IN THE POSTERIOR OROPHARYNX ACTIVITY LOCALIZES TO the region of the LEFT PALATINE TONSIL (IMAGE 36). ACTIVITY IS INTENSE WITH SUV MAX EQUAL 9.1. There is activity is same level within the RIGHT posterior oropharynx but less intense (SUV max equal 5.3). Activity of both these lesions is confined to the pharyngeal mucosa. NO HYPERMETABOLIC RIGHT CERVICAL LYMPH NODES. Incidental CT findings: none CHEST: No hypermetabolic mediastinal or hilar nodes. No suspicious pulmonary nodules on the CT scan. Incidental CT findings: Post CABG ABDOMEN/PELVIS: No abnormal hypermetabolic activity within the liver, pancreas, adrenal glands, or spleen. No hypermetabolic lymph nodes in the abdomen or pelvis. Incidental CT findings: Atherosclerotic calcification of the aorta. Prostate normal SKELETON: No focal hypermetabolic activity to suggest skeletal metastasis. Incidental CT findings: none IMPRESSION: 1. Two hypermetabolic lymph nodes in the LEFT level 2 cervical chain consistent with metastatic adenopathy.  2. Focus of intense asymmetric hypermetabolic activity in the LEFT posterior pharynx localizing to region of the palatine tonsil concerning for primary head neck carcinoma. Activity confined to the pharyngeal mucosa. Similar less intense activity at the same level in the RIGHT posterior pharynx is indeterminate. 3. No contralateral RIGHT cervical adenopathy. 4. No evidence distant metastatic disease. Electronically Signed   By: Suzy Bouchard M.D.   On: 09/18/2018 11:46   [place] Core Biopsy (lymph Nodes)  Result Date: 09/25/2018 INDICATION: 63 year old male with a history of cervical adenopathy, positive FDG activity on prior PET-CT, and prior biopsy demonstrating head and neck carcinoma. He has been referred for repeat biopsy for P 16 testing EXAM: ULTRASOUND-GUIDED BIOPSY LEFT CERVICAL ADENOPATHY MEDICATIONS: None. ANESTHESIA/SEDATION: None FLUOROSCOPY TIME:   Ultrasound COMPLICATIONS: None none PROCEDURE: Informed written consent was obtained from the patient after a thorough discussion of the procedural risks, benefits and alternatives. All questions were addressed. Maximal Sterile Barrier Technique was utilized including caps, mask, sterile gowns, sterile gloves, sterile drape, hand hygiene and skin antiseptic. A timeout was performed prior to the initiation of the procedure. Ultrasound survey was performed with images stored and sent to PACs. The left neck was prepped with chlorhexidine in a sterile fashion, and a sterile drape was applied covering the operative field. A sterile gown and sterile gloves were used for the procedure. Local anesthesia was provided with 1% Lidocaine. Ultrasound guidance was used to infiltrate the region with 1% lidocaine for local anesthesia. Seven separate 18 gauge core biopsy were then acquired of the pathologic lymph node using ultrasound guidance. Images were stored. Final image was stored after biopsy. Patient tolerated the procedure well and remained hemodynamically stable throughout. No complications were encountered and no significant blood loss was encounter IMPRESSION: Status post ultrasound-guided biopsy of left cervical lymph node. Tissue specimen sent to pathology for additional testing. Signed, Dulcy Fanny. Dellia Nims, RPVI [facility] Electronically Signed   By: Corrie Mckusick D.O.   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   PLAN:    1. Stage IVa squamous cell carcinoma of the left tonsil: PET scan results reviewed independently and reported as above indicating that this is likely left tonsillar primary, but size could not be determined so his staging remains Tx.  Repeat biopsy confirmed P16+.  Continue daily XRT.  Because of patient's rising creatinine, will hold cisplatin today.  Return to clinic in 1 week  for further evaluation and reconsideration of cycle 2. 2.  Elevated creatinine: Unclear etiology, possibly related to cisplatin.  Hold treatment as above.  Return to clinic in 1 week.   I spent a total of 30 minutes face-to-face with the patient of which greater than 50% of the visit was spent in counseling and coordination of care as detailed above.   Patient expressed understanding and was in agreement with this plan. He also understands that He can call clinic at any time with any questions, concerns, or complaints.   Cancer Staging Squamous cell carcinoma of left tonsil ([place]) Staging form: Pharynx - HPV-Mediated Oropharynx, AJCC 8th Edition - Clinical: No stage assigned - Unsigned   Lloyd Huger, MD   10/12/2018 6:45 AM

## 2018-10-09 ENCOUNTER — Other Ambulatory Visit: Payer: Self-pay

## 2018-10-09 ENCOUNTER — Ambulatory Visit
Admission: RE | Admit: 2018-10-09 | Discharge: 2018-10-09 | Disposition: A | Discharge status: Home / Self Care | Payer: 59 | Source: Ambulatory Visit | Attending: Radiation Oncology | Admitting: Radiation Oncology

## 2018-10-09 DIAGNOSIS — C099 Malignant neoplasm of tonsil, unspecified: Secondary | ICD-10-CM | POA: Diagnosis not present

## 2018-10-10 ENCOUNTER — Other Ambulatory Visit: Payer: Self-pay

## 2018-10-10 ENCOUNTER — Other Ambulatory Visit: Payer: Self-pay | Admitting: *Deleted

## 2018-10-10 ENCOUNTER — Ambulatory Visit
Admission: RE | Admit: 2018-10-10 | Discharge: 2018-10-10 | Disposition: A | Discharge status: Home / Self Care | Payer: 59 | Source: Ambulatory Visit | Attending: Radiation Oncology | Admitting: Radiation Oncology

## 2018-10-10 DIAGNOSIS — C099 Malignant neoplasm of tonsil, unspecified: Secondary | ICD-10-CM | POA: Diagnosis not present

## 2018-10-10 MED ORDER — SUCRALFATE 1 G PO TABS: 1.0000 g | ORAL_TABLET | Freq: Three times a day (TID) | ORAL | 6 refills | Status: DC

## 2018-10-11 ENCOUNTER — Other Ambulatory Visit: Payer: Self-pay

## 2018-10-11 ENCOUNTER — Inpatient Hospital Stay (HOSPITAL_BASED_OUTPATIENT_CLINIC_OR_DEPARTMENT_OTHER): Payer: 59 | Admitting: Oncology

## 2018-10-11 ENCOUNTER — Inpatient Hospital Stay: Payer: 59

## 2018-10-11 ENCOUNTER — Ambulatory Visit
Admission: RE | Admit: 2018-10-11 | Discharge: 2018-10-11 | Disposition: A | Discharge status: Home / Self Care | Payer: 59 | Source: Ambulatory Visit | Attending: Radiation Oncology | Admitting: Radiation Oncology

## 2018-10-11 ENCOUNTER — Encounter: Payer: Self-pay | Admitting: Oncology

## 2018-10-11 VITALS — BP 151/80 | HR 50 | Temp 98.3°F | Ht 69.0 in | Wt 221.0 lb

## 2018-10-11 DIAGNOSIS — C099 Malignant neoplasm of tonsil, unspecified: Secondary | ICD-10-CM

## 2018-10-11 DIAGNOSIS — R7989 Other specified abnormal findings of blood chemistry: Secondary | ICD-10-CM

## 2018-10-11 DIAGNOSIS — Z5111 Encounter for antineoplastic chemotherapy: Secondary | ICD-10-CM | POA: Diagnosis not present

## 2018-10-11 LAB — CBC WITH DIFFERENTIAL/PLATELET
Abs Immature Granulocytes: 0.04 10*3/uL (ref 0.00–0.07)
Basophils Absolute: 0 10*3/uL (ref 0.0–0.1)
Basophils Relative: 0 %
Eosinophils Absolute: 0.3 10*3/uL (ref 0.0–0.5)
Eosinophils Relative: 3 %
HCT: 36.5 % — ABNORMAL LOW (ref 39.0–52.0)
Hemoglobin: 12.4 g/dL — ABNORMAL LOW (ref 13.0–17.0)
Immature Granulocytes: 0 %
Lymphocytes Relative: 17 %
Lymphs Abs: 1.6 10*3/uL (ref 0.7–4.0)
MCH: 32.5 pg (ref 26.0–34.0)
MCHC: 34 g/dL (ref 30.0–36.0)
MCV: 95.8 fL (ref 80.0–100.0)
Monocytes Absolute: 1.3 10*3/uL — ABNORMAL HIGH (ref 0.1–1.0)
Monocytes Relative: 13 %
Neutro Abs: 6.7 10*3/uL (ref 1.7–7.7)
Neutrophils Relative %: 67 %
Platelets: 156 10*3/uL (ref 150–400)
RBC: 3.81 MIL/uL — ABNORMAL LOW (ref 4.22–5.81)
RDW: 12.4 % (ref 11.5–15.5)
WBC: 9.9 10*3/uL (ref 4.0–10.5)
nRBC: 0 % (ref 0.0–0.2)

## 2018-10-11 LAB — BASIC METABOLIC PANEL
Anion gap: 10 (ref 5–15)
BUN: 55 mg/dL — ABNORMAL HIGH (ref 8–23)
CO2: 27 mmol/L (ref 22–32)
Calcium: 8.9 mg/dL (ref 8.9–10.3)
Chloride: 101 mmol/L (ref 98–111)
Creatinine, Ser: 2.81 mg/dL — ABNORMAL HIGH (ref 0.61–1.24)
GFR calc Af Amer: 27 mL/min — ABNORMAL LOW (ref >60)
GFR calc non Af Amer: 23 mL/min — ABNORMAL LOW (ref >60)
Glucose, Bld: 109 mg/dL — ABNORMAL HIGH (ref 70–99)
Potassium: 4.7 mmol/L (ref 3.5–5.1)
Sodium: 138 mmol/L (ref 135–145)

## 2018-10-11 NOTE — Progress Notes (Signed)
Patient stated that his taste buds are gone. However, he continues to have a great appetite. Patient denied fever, chills, nausea, vomiting, diarrhea or constipation.

## 2018-10-12 ENCOUNTER — Ambulatory Visit
Admission: RE | Admit: 2018-10-12 | Discharge: 2018-10-12 | Disposition: A | Discharge status: Home / Self Care | Payer: 59 | Source: Ambulatory Visit | Attending: Radiation Oncology | Admitting: Radiation Oncology

## 2018-10-12 ENCOUNTER — Other Ambulatory Visit: Payer: Self-pay

## 2018-10-12 DIAGNOSIS — C099 Malignant neoplasm of tonsil, unspecified: Secondary | ICD-10-CM | POA: Diagnosis not present

## 2018-10-13 ENCOUNTER — Ambulatory Visit
Admission: RE | Admit: 2018-10-13 | Discharge: 2018-10-13 | Disposition: A | Discharge status: Home / Self Care | Payer: 59 | Source: Ambulatory Visit | Attending: Radiation Oncology | Admitting: Radiation Oncology

## 2018-10-13 ENCOUNTER — Other Ambulatory Visit: Payer: Self-pay

## 2018-10-13 DIAGNOSIS — C099 Malignant neoplasm of tonsil, unspecified: Secondary | ICD-10-CM | POA: Diagnosis not present

## 2018-10-15 NOTE — Progress Notes (Signed)
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   INTERVAL HISTORY: Patient returns to clinic today for further evaluation and reconsideration of cycle 2 of weekly cisplatin.  He reports significantly improving his fluid intake this past week.  He currently feels well and is asymptomatic. He denies any dysphasia.  He has no neurologic complaints.  He denies any recent fevers or illnesses.  He has a good appetite and denies weight loss.  He has no chest pain, shortness of breath, cough, or hemoptysis.  He denies any nausea, vomiting, constipation, or diarrhea.  He has no urinary complaints.  Patient offers no specific complaints today.  REVIEW OF SYSTEMS:   Review of Systems  Constitutional: Negative.  Negative for fever, malaise/fatigue and weight loss.  Respiratory: Negative.  Negative for cough, hemoptysis and shortness of breath.   Cardiovascular: Negative.  Negative for leg swelling.  Gastrointestinal: Negative.  Negative for abdominal pain.  Genitourinary: Negative.  Negative for dysuria.  Musculoskeletal: Negative.  Negative for neck pain.  Skin: Negative.  Negative for rash.  Neurological: Negative.  Negative for dizziness, focal weakness, weakness and headaches.  Psychiatric/Behavioral: Negative.  The patient is not nervous/anxious.     As per HPI. Otherwise, a complete review of systems is negative.  PAST MEDICAL HISTORY: Past Medical History:  Diagnosis Date  . Coronary artery disease   . Hypertension     PAST SURGICAL HISTORY: Past Surgical History:  Procedure Laterality Date  . CORONARY ARTERY BYPASS GRAFT  2010   Quadruple    FAMILY HISTORY: Family History  Problem Relation Age of Onset  . Arthritis Mother   .  Heart attack Father   . Brain cancer Sister     ADVANCED DIRECTIVES (Y/N):  N  HEALTH MAINTENANCE: Social History   Tobacco Use  . Smoking status: Former Smoker    Types: Cigarettes    Quit date: 10/17/2008    Years since quitting: 10.0  . Smokeless tobacco: Never Used  Substance Use Topics  . Alcohol use: Yes    Comment: occasionally  . Drug use: Never     Colonoscopy:  PAP:  Bone density:  Lipid panel:  No Known Allergies  Current Outpatient Medications  Medication Sig Dispense Refill  . ALPRAZolam (XANAX) 0.5 MG tablet Take 1 tablet (0.5 mg total) by mouth daily as needed for anxiety. Take 30 minutes prior to radiation 30 tablet 0  . amLODipine (NORVASC) 10 MG tablet     . ascorbic acid (VITAMIN C) 1000 MG tablet Take 1 tablet by mouth.    Marland Kitchen aspirin 325 MG tablet Take 325 mg by mouth daily.    Marland Kitchen atorvastatin (LIPITOR) 80 MG tablet Take 1 tablet by mouth 1 day or 1 dose.    . DENTA 5000 PLUS 1.1 % CREA dental cream BRUSH WITH PASTE 2 3 TIMES PER DAY FOR AT LEAST 1 MINUTE    . lovastatin (MEVACOR) 40 MG tablet Take 40 mg by mouth at bedtime.    . metoprolol succinate (TOPROL-XL) 25 MG 24 hr tablet Take 1 tablet by mouth 1 day or 1 dose.    . olmesartan-hydrochlorothiazide (BENICAR HCT) 40-12.5 MG tablet Take 1 tablet by mouth 1 day or 1 dose.    . ondansetron (ZOFRAN) 8 MG tablet  Take 1 tablet (8 mg total) by mouth 2 (two) times daily as needed. 30 tablet 1  . prochlorperazine (COMPAZINE) 10 MG tablet Take 1 tablet (10 mg total) by mouth every 6 (six) hours as needed (Nausea or vomiting). 30 tablet 1  . sucralfate (CARAFATE) 1 g tablet Take 1 tablet (1 g total) by mouth 3 (three) times daily before meals. Dissolve in warm water, gargle/swallow 90 tablet 6   No current facility-administered medications for this visit.     OBJECTIVE: Vitals:   10/18/18 0938  BP: 140/79  Pulse: (!) 55  Resp: 18  Temp: 98.8 F (37.1 C)     Body mass index is 32.22 kg/m.    ECOG FS:0  - Asymptomatic  General: Well-developed, well-nourished, no acute distress. Eyes: Pink conjunctiva, anicteric sclera. HEENT: Normocephalic, moist mucous membranes, clear oropharnyx.  Easily palpable left cervical lymph node. Lungs: Clear to auscultation bilaterally. Heart: Regular rate and rhythm. No rubs, murmurs, or gallops. Abdomen: Soft, nontender, nondistended. No organomegaly noted, normoactive bowel sounds. Musculoskeletal: No edema, cyanosis, or clubbing. Neuro: Alert, answering all questions appropriately. Cranial nerves grossly intact. Skin: No rashes or petechiae noted. Psych: Normal affect.  LAB RESULTS:  Lab Results  Component Value Date   NA 138 10/18/2018   K 4.1 10/18/2018   CL 100 10/18/2018   CO2 25 10/18/2018   GLUCOSE 132 (H) 10/18/2018   BUN 29 (H) 10/18/2018   CREATININE 1.93 (H) 10/18/2018   CALCIUM 9.2 10/18/2018   GFRNONAA 36 (L) 10/18/2018   GFRAA 42 (L) 10/18/2018    Lab Results  Component Value Date   WBC 8.5 10/18/2018   NEUTROABS 6.1 10/18/2018   HGB 12.1 (L) 10/18/2018   HCT 36.1 (L) 10/18/2018   MCV 98.1 10/18/2018   PLT 190 10/18/2018     STUDIES: [place] Core Biopsy (lymph Nodes)  Result Date: 09/25/2018 INDICATION: 63 year old male with a history of cervical adenopathy, positive FDG activity on prior PET-CT, and prior biopsy demonstrating head and neck carcinoma. He has been referred for repeat biopsy for P 16 testing EXAM: ULTRASOUND-GUIDED BIOPSY LEFT CERVICAL ADENOPATHY MEDICATIONS: None. ANESTHESIA/SEDATION: None FLUOROSCOPY TIME:  Ultrasound COMPLICATIONS: None none PROCEDURE: Informed written consent was obtained from the patient after a thorough discussion of the procedural risks, benefits and alternatives. All questions were addressed. Maximal Sterile Barrier Technique was utilized including caps, mask, sterile gowns, sterile gloves, sterile drape, hand hygiene and skin antiseptic. A timeout was performed prior to the initiation of the  procedure. Ultrasound survey was performed with images stored and sent to PACs. The left neck was prepped with chlorhexidine in a sterile fashion, and a sterile drape was applied covering the operative field. A sterile gown and sterile gloves were used for the procedure. Local anesthesia was provided with 1% Lidocaine. Ultrasound guidance was used to infiltrate the region with 1% lidocaine for local anesthesia. Seven separate 18 gauge core biopsy were then acquired of the pathologic lymph node using ultrasound guidance. Images were stored. Final image was stored after biopsy. Patient tolerated the procedure well and remained hemodynamically stable throughout. No complications were encountered and no significant blood loss was encounter IMPRESSION: Status post ultrasound-guided biopsy of left cervical lymph node. Tissue specimen sent to pathology for additional testing. Signed, Dulcy Fanny. Dellia Nims, RPVI [facility] Electronically Signed   By: Corrie Mckusick D.O.   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  PLAN:    1. Stage IVa squamous cell carcinoma of the left tonsil: PET scan results reviewed independently and reported as above indicating that this is likely left tonsillar primary, but size could not be determined so his staging remains Tx.  Repeat biopsy confirmed P16+.  Continue daily XRT.  Patient's creatinine is significantly improved, although remains elevated.  We will proceed cautiously with dose reduced cisplatin today at 20 mg/m.  Patient will also return to clinic on Monday for 1 L of IV fluids.  Return to clinic in 1 week for further evaluation and consideration of cycle 3.  2.  Elevated creatinine: Improved.  Patient admits to decreased fluid intake.  Proceed cautiously with cisplatin as above and return for IV fluids next week.  I spent a total of 30 minutes face-to-face with  the patient of which greater than 50% of the visit was spent in counseling and coordination of care as detailed above.   Patient expressed understanding and was in agreement with this plan. He also understands that He can call clinic at any time with any questions, concerns, or complaints.   Cancer Staging Squamous cell carcinoma of left tonsil ([place]) Staging form: Pharynx - HPV-Mediated Oropharynx, AJCC 8th Edition - Clinical: No stage assigned - Unsigned   Lloyd Huger, MD   10/18/2018 9:15 PM

## 2018-10-16 ENCOUNTER — Ambulatory Visit
Admission: RE | Admit: 2018-10-16 | Discharge: 2018-10-16 | Disposition: A | Discharge status: Home / Self Care | Payer: 59 | Source: Ambulatory Visit | Attending: Radiation Oncology | Admitting: Radiation Oncology

## 2018-10-16 ENCOUNTER — Other Ambulatory Visit: Payer: Self-pay

## 2018-10-16 DIAGNOSIS — C099 Malignant neoplasm of tonsil, unspecified: Secondary | ICD-10-CM | POA: Diagnosis not present

## 2018-10-17 ENCOUNTER — Ambulatory Visit
Admission: RE | Admit: 2018-10-17 | Discharge: 2018-10-17 | Disposition: A | Discharge status: Home / Self Care | Payer: 59 | Source: Ambulatory Visit | Attending: Radiation Oncology | Admitting: Radiation Oncology

## 2018-10-17 ENCOUNTER — Other Ambulatory Visit: Payer: Self-pay

## 2018-10-17 DIAGNOSIS — C099 Malignant neoplasm of tonsil, unspecified: Secondary | ICD-10-CM | POA: Diagnosis not present

## 2018-10-18 ENCOUNTER — Inpatient Hospital Stay: Payer: 59 | Attending: Oncology

## 2018-10-18 ENCOUNTER — Encounter: Payer: Self-pay | Admitting: Oncology

## 2018-10-18 ENCOUNTER — Other Ambulatory Visit: Payer: Self-pay

## 2018-10-18 ENCOUNTER — Inpatient Hospital Stay: Payer: 59

## 2018-10-18 ENCOUNTER — Ambulatory Visit
Admission: RE | Admit: 2018-10-18 | Discharge: 2018-10-18 | Disposition: A | Discharge status: Home / Self Care | Payer: 59 | Source: Ambulatory Visit | Attending: Radiation Oncology | Admitting: Radiation Oncology

## 2018-10-18 ENCOUNTER — Inpatient Hospital Stay (HOSPITAL_BASED_OUTPATIENT_CLINIC_OR_DEPARTMENT_OTHER): Payer: 59 | Admitting: Oncology

## 2018-10-18 VITALS — BP 140/79 | HR 55 | Temp 98.8°F | Resp 18 | Wt 218.2 lb

## 2018-10-18 DIAGNOSIS — I2510 Atherosclerotic heart disease of native coronary artery without angina pectoris: Secondary | ICD-10-CM | POA: Diagnosis not present

## 2018-10-18 DIAGNOSIS — D649 Anemia, unspecified: Secondary | ICD-10-CM | POA: Insufficient documentation

## 2018-10-18 DIAGNOSIS — Z87891 Personal history of nicotine dependence: Secondary | ICD-10-CM | POA: Insufficient documentation

## 2018-10-18 DIAGNOSIS — C099 Malignant neoplasm of tonsil, unspecified: Secondary | ICD-10-CM

## 2018-10-18 DIAGNOSIS — C770 Secondary and unspecified malignant neoplasm of lymph nodes of head, face and neck: Secondary | ICD-10-CM | POA: Diagnosis not present

## 2018-10-18 DIAGNOSIS — Z8261 Family history of arthritis: Secondary | ICD-10-CM

## 2018-10-18 DIAGNOSIS — R7989 Other specified abnormal findings of blood chemistry: Secondary | ICD-10-CM | POA: Diagnosis not present

## 2018-10-18 DIAGNOSIS — Z79899 Other long term (current) drug therapy: Secondary | ICD-10-CM | POA: Insufficient documentation

## 2018-10-18 DIAGNOSIS — Z808 Family history of malignant neoplasm of other organs or systems: Secondary | ICD-10-CM | POA: Insufficient documentation

## 2018-10-18 DIAGNOSIS — Z8249 Family history of ischemic heart disease and other diseases of the circulatory system: Secondary | ICD-10-CM

## 2018-10-18 DIAGNOSIS — D696 Thrombocytopenia, unspecified: Secondary | ICD-10-CM | POA: Insufficient documentation

## 2018-10-18 DIAGNOSIS — I10 Essential (primary) hypertension: Secondary | ICD-10-CM | POA: Diagnosis not present

## 2018-10-18 DIAGNOSIS — Z5111 Encounter for antineoplastic chemotherapy: Secondary | ICD-10-CM | POA: Insufficient documentation

## 2018-10-18 DIAGNOSIS — Z510 Encounter for antineoplastic radiation therapy: Secondary | ICD-10-CM | POA: Diagnosis not present

## 2018-10-18 LAB — CBC WITH DIFFERENTIAL/PLATELET
Abs Immature Granulocytes: 0.02 10*3/uL (ref 0.00–0.07)
Basophils Absolute: 0 10*3/uL (ref 0.0–0.1)
Basophils Relative: 0 %
Eosinophils Absolute: 0.3 10*3/uL (ref 0.0–0.5)
Eosinophils Relative: 3 %
HCT: 36.1 % — ABNORMAL LOW (ref 39.0–52.0)
Hemoglobin: 12.1 g/dL — ABNORMAL LOW (ref 13.0–17.0)
Immature Granulocytes: 0 %
Lymphocytes Relative: 15 %
Lymphs Abs: 1.2 10*3/uL (ref 0.7–4.0)
MCH: 32.9 pg (ref 26.0–34.0)
MCHC: 33.5 g/dL (ref 30.0–36.0)
MCV: 98.1 fL (ref 80.0–100.0)
Monocytes Absolute: 0.8 10*3/uL (ref 0.1–1.0)
Monocytes Relative: 9 %
Neutro Abs: 6.1 10*3/uL (ref 1.7–7.7)
Neutrophils Relative %: 73 %
Platelets: 190 10*3/uL (ref 150–400)
RBC: 3.68 MIL/uL — ABNORMAL LOW (ref 4.22–5.81)
RDW: 12 % (ref 11.5–15.5)
WBC: 8.5 10*3/uL (ref 4.0–10.5)
nRBC: 0 % (ref 0.0–0.2)

## 2018-10-18 LAB — BASIC METABOLIC PANEL
Anion gap: 13 (ref 5–15)
BUN: 29 mg/dL — ABNORMAL HIGH (ref 8–23)
CO2: 25 mmol/L (ref 22–32)
Calcium: 9.2 mg/dL (ref 8.9–10.3)
Chloride: 100 mmol/L (ref 98–111)
Creatinine, Ser: 1.93 mg/dL — ABNORMAL HIGH (ref 0.61–1.24)
GFR calc Af Amer: 42 mL/min — ABNORMAL LOW (ref >60)
GFR calc non Af Amer: 36 mL/min — ABNORMAL LOW (ref >60)
Glucose, Bld: 132 mg/dL — ABNORMAL HIGH (ref 70–99)
Potassium: 4.1 mmol/L (ref 3.5–5.1)
Sodium: 138 mmol/L (ref 135–145)

## 2018-10-18 MED ORDER — SODIUM CHLORIDE 0.9 % IV SOLN
20.0000 mg/m2 | Freq: Once | INTRAVENOUS | Status: AC
  Administered 2018-10-18: 44 mg via INTRAVENOUS
  Filled 2018-10-18: qty 44

## 2018-10-18 MED ORDER — PALONOSETRON HCL INJECTION 0.25 MG/5ML
0.2500 mg | Freq: Once | INTRAVENOUS | Status: AC
  Administered 2018-10-18: 0.25 mg via INTRAVENOUS
  Filled 2018-10-18: qty 5

## 2018-10-18 MED ORDER — POTASSIUM CHLORIDE 2 MEQ/ML IV SOLN
Freq: Once | INTRAVENOUS | Status: AC
  Administered 2018-10-18: 11:00:00 via INTRAVENOUS
  Filled 2018-10-18: qty 1000

## 2018-10-18 MED ORDER — SODIUM CHLORIDE 0.9 % IV SOLN
Freq: Once | INTRAVENOUS | Status: AC
  Administered 2018-10-18: 11:00:00 via INTRAVENOUS
  Filled 2018-10-18: qty 250

## 2018-10-18 MED ORDER — SODIUM CHLORIDE 0.9 % IV SOLN
Freq: Once | INTRAVENOUS | Status: AC
  Administered 2018-10-18: 13:00:00 via INTRAVENOUS
  Filled 2018-10-18: qty 5

## 2018-10-18 NOTE — Progress Notes (Signed)
Pt in for follow up denies any difficulties today.

## 2018-10-19 ENCOUNTER — Other Ambulatory Visit: Payer: Self-pay

## 2018-10-19 ENCOUNTER — Ambulatory Visit
Admission: RE | Admit: 2018-10-19 | Discharge: 2018-10-19 | Disposition: A | Discharge status: Home / Self Care | Payer: 59 | Source: Ambulatory Visit | Attending: Radiation Oncology | Admitting: Radiation Oncology

## 2018-10-19 DIAGNOSIS — C099 Malignant neoplasm of tonsil, unspecified: Secondary | ICD-10-CM | POA: Diagnosis not present

## 2018-10-21 NOTE — Progress Notes (Signed)
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   INTERVAL HISTORY: Patient returns to clinic today for further evaluation and consideration of cycle 3 of weekly cisplatin.  He tolerated dose reduced treatment last week well without significant side effects.  He currently feels well and is asymptomatic. He denies any dysphasia.  He has no neurologic complaints.  He denies any recent fevers or illnesses.  He has a good appetite and denies weight loss.  He has no chest pain, shortness of breath, cough, or hemoptysis.  He denies any nausea, vomiting, constipation, or diarrhea.  He has no urinary complaints.  Patient offers no specific complaints today.  REVIEW OF SYSTEMS:   Review of Systems  Constitutional: Negative.  Negative for fever, malaise/fatigue and weight loss.  Respiratory: Negative.  Negative for cough, hemoptysis and shortness of breath.   Cardiovascular: Negative.  Negative for leg swelling.  Gastrointestinal: Negative.  Negative for abdominal pain.  Genitourinary: Negative.  Negative for dysuria.  Musculoskeletal: Negative.  Negative for neck pain.  Skin: Negative.  Negative for rash.  Neurological: Negative.  Negative for dizziness, focal weakness, weakness and headaches.  Psychiatric/Behavioral: Negative.  The patient is not nervous/anxious.     As per HPI. Otherwise, a complete review of systems is negative.  PAST MEDICAL HISTORY: Past Medical History:  Diagnosis Date  . Coronary artery disease   . Hypertension     PAST SURGICAL HISTORY: Past Surgical History:  Procedure Laterality Date  . CORONARY ARTERY BYPASS GRAFT  2010   Quadruple    FAMILY HISTORY: Family History  Problem Relation Age of Onset  .  Arthritis Mother   . Heart attack Father   . Brain cancer Sister     ADVANCED DIRECTIVES (Y/N):  N  HEALTH MAINTENANCE: Social History   Tobacco Use  . Smoking status: Former Smoker    Types: Cigarettes    Quit date: 10/17/2008    Years since quitting: 10.0  . Smokeless tobacco: Never Used  Substance Use Topics  . Alcohol use: Yes    Comment: occasionally  . Drug use: Never     Colonoscopy:  PAP:  Bone density:  Lipid panel:  No Known Allergies  Current Outpatient Medications  Medication Sig Dispense Refill  . ALPRAZolam (XANAX) 0.5 MG tablet Take 1 tablet (0.5 mg total) by mouth daily as needed for anxiety. Take 30 minutes prior to radiation 30 tablet 0  . amLODipine (NORVASC) 10 MG tablet     . ascorbic acid (VITAMIN C) 1000 MG tablet Take 1 tablet by mouth.    Marland Kitchen aspirin 325 MG tablet Take 325 mg by mouth daily.    Marland Kitchen atorvastatin (LIPITOR) 80 MG tablet Take 1 tablet by mouth 1 day or 1 dose.    . DENTA 5000 PLUS 1.1 % CREA dental cream BRUSH WITH PASTE 2 3 TIMES PER DAY FOR AT LEAST 1 MINUTE    . lovastatin (MEVACOR) 40 MG tablet Take 40 mg by mouth at bedtime.    . metoprolol succinate (TOPROL-XL) 25 MG 24 hr tablet Take 1 tablet by mouth 1 day or 1 dose.    . olmesartan-hydrochlorothiazide (BENICAR HCT) 40-12.5 MG tablet Take 1 tablet by mouth 1 day or 1 dose.    . ondansetron (ZOFRAN) 8  MG tablet Take 1 tablet (8 mg total) by mouth 2 (two) times daily as needed. 30 tablet 1  . prochlorperazine (COMPAZINE) 10 MG tablet Take 1 tablet (10 mg total) by mouth every 6 (six) hours as needed (Nausea or vomiting). 30 tablet 1  . sucralfate (CARAFATE) 1 g tablet Take 1 tablet (1 g total) by mouth 3 (three) times daily before meals. Dissolve in warm water, gargle/swallow 90 tablet 6   No current facility-administered medications for this visit.     OBJECTIVE: Vitals:   10/25/18 0839  BP: 139/83  Pulse: 69  Temp: 98.2 F (36.8 C)     Body mass index is 31.94 kg/m.     ECOG FS:0 - Asymptomatic  General: Well-developed, well-nourished, no acute distress. Eyes: Pink conjunctiva, anicteric sclera. HEENT: Normocephalic, moist mucous membranes, clear oropharnyx.  Easily palpable left cervical lymph node. Lungs: Clear to auscultation bilaterally. Heart: Regular rate and rhythm. No rubs, murmurs, or gallops. Abdomen: Soft, nontender, nondistended. No organomegaly noted, normoactive bowel sounds. Musculoskeletal: No edema, cyanosis, or clubbing. Neuro: Alert, answering all questions appropriately. Cranial nerves grossly intact. Skin: No rashes or petechiae noted. Psych: Normal affect.  LAB RESULTS:  Lab Results  Component Value Date   NA 137 10/25/2018   K 4.3 10/25/2018   CL 101 10/25/2018   CO2 27 10/25/2018   GLUCOSE 135 (H) 10/25/2018   BUN 27 (H) 10/25/2018   CREATININE 1.55 (H) 10/25/2018   CALCIUM 9.2 10/25/2018   GFRNONAA 47 (L) 10/25/2018   GFRAA 55 (L) 10/25/2018    Lab Results  Component Value Date   WBC 6.1 10/25/2018   NEUTROABS 4.1 10/25/2018   HGB 12.2 (L) 10/25/2018   HCT 36.3 (L) 10/25/2018   MCV 95.5 10/25/2018   PLT 207 10/25/2018     STUDIES: No results found.  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   PLAN:    1. Stage IVa squamous cell carcinoma of the left tonsil: PET scan results reviewed independently and reported as above indicating that this is likely left tonsillar primary, but size could not be determined so his staging remains Tx.  Repeat biopsy confirmed P16+.  Continue daily XRT.  Patient's creatinine continues to improve and is now 1.55.  Continue to proceed cautiously with creatinine increasing his dose to 30 mg/m2.  Return to clinic on Monday for IV fluids.  Patient will then return to clinic in 1 week for further evaluation and consideration of cycle 4.  If his creatinine continues to improve, will increase cisplatin back to his original starting dose at 40 mg/m.   2.   Elevated creatinine: Improved.  Proceed cautiously with cisplatin as above and return for IV fluids next week.  I spent a total of 30 minutes face-to-face with the patient of which greater than 50% of the visit was spent in counseling and coordination of care as detailed above.  Patient expressed understanding and was in agreement with this plan. He also understands that He can call clinic at any time with any questions, concerns, or complaints.   Cancer Staging Squamous cell carcinoma of left tonsil ([place]) Staging form: Pharynx - HPV-Mediated Oropharynx, AJCC 8th Edition - Clinical: No stage assigned - Unsigned   Lloyd Huger, MD   10/28/2018 6:57 AM

## 2018-10-23 ENCOUNTER — Other Ambulatory Visit: Payer: Self-pay | Admitting: Oncology

## 2018-10-23 ENCOUNTER — Ambulatory Visit
Admission: RE | Admit: 2018-10-23 | Discharge: 2018-10-23 | Disposition: A | Discharge status: Home / Self Care | Payer: 59 | Source: Ambulatory Visit | Attending: Radiation Oncology | Admitting: Radiation Oncology

## 2018-10-23 ENCOUNTER — Inpatient Hospital Stay: Payer: 59

## 2018-10-23 ENCOUNTER — Other Ambulatory Visit: Payer: Self-pay

## 2018-10-23 VITALS — BP 148/87 | HR 57 | Temp 98.2°F | Resp 18

## 2018-10-23 DIAGNOSIS — C099 Malignant neoplasm of tonsil, unspecified: Secondary | ICD-10-CM

## 2018-10-23 DIAGNOSIS — Z5111 Encounter for antineoplastic chemotherapy: Secondary | ICD-10-CM | POA: Diagnosis not present

## 2018-10-23 MED ORDER — SODIUM CHLORIDE 0.9 % IV SOLN
Freq: Once | INTRAVENOUS | Status: AC
  Administered 2018-10-23: 11:00:00 via INTRAVENOUS
  Filled 2018-10-23: qty 250

## 2018-10-24 ENCOUNTER — Ambulatory Visit
Admission: RE | Admit: 2018-10-24 | Discharge: 2018-10-24 | Disposition: A | Discharge status: Home / Self Care | Payer: 59 | Source: Ambulatory Visit | Attending: Radiation Oncology | Admitting: Radiation Oncology

## 2018-10-24 ENCOUNTER — Other Ambulatory Visit: Payer: Self-pay

## 2018-10-24 DIAGNOSIS — C099 Malignant neoplasm of tonsil, unspecified: Secondary | ICD-10-CM | POA: Diagnosis not present

## 2018-10-25 ENCOUNTER — Inpatient Hospital Stay (HOSPITAL_BASED_OUTPATIENT_CLINIC_OR_DEPARTMENT_OTHER): Payer: 59 | Admitting: Oncology

## 2018-10-25 ENCOUNTER — Inpatient Hospital Stay: Payer: 59

## 2018-10-25 ENCOUNTER — Ambulatory Visit
Admission: RE | Admit: 2018-10-25 | Discharge: 2018-10-25 | Disposition: A | Discharge status: Home / Self Care | Payer: 59 | Source: Ambulatory Visit | Attending: Radiation Oncology | Admitting: Radiation Oncology

## 2018-10-25 ENCOUNTER — Encounter: Payer: Self-pay | Admitting: Oncology

## 2018-10-25 ENCOUNTER — Other Ambulatory Visit: Payer: Self-pay

## 2018-10-25 VITALS — BP 139/83 | HR 69 | Temp 98.2°F | Ht 69.0 in | Wt 216.3 lb

## 2018-10-25 DIAGNOSIS — I2510 Atherosclerotic heart disease of native coronary artery without angina pectoris: Secondary | ICD-10-CM | POA: Diagnosis not present

## 2018-10-25 DIAGNOSIS — Z8261 Family history of arthritis: Secondary | ICD-10-CM

## 2018-10-25 DIAGNOSIS — C099 Malignant neoplasm of tonsil, unspecified: Secondary | ICD-10-CM

## 2018-10-25 DIAGNOSIS — Z79899 Other long term (current) drug therapy: Secondary | ICD-10-CM

## 2018-10-25 DIAGNOSIS — R7989 Other specified abnormal findings of blood chemistry: Secondary | ICD-10-CM

## 2018-10-25 DIAGNOSIS — Z5111 Encounter for antineoplastic chemotherapy: Secondary | ICD-10-CM | POA: Diagnosis not present

## 2018-10-25 DIAGNOSIS — Z8249 Family history of ischemic heart disease and other diseases of the circulatory system: Secondary | ICD-10-CM

## 2018-10-25 DIAGNOSIS — Z808 Family history of malignant neoplasm of other organs or systems: Secondary | ICD-10-CM

## 2018-10-25 DIAGNOSIS — I10 Essential (primary) hypertension: Secondary | ICD-10-CM | POA: Diagnosis not present

## 2018-10-25 DIAGNOSIS — Z87891 Personal history of nicotine dependence: Secondary | ICD-10-CM

## 2018-10-25 LAB — CBC WITH DIFFERENTIAL/PLATELET
Abs Immature Granulocytes: 0.02 10*3/uL (ref 0.00–0.07)
Basophils Absolute: 0 10*3/uL (ref 0.0–0.1)
Basophils Relative: 0 %
Eosinophils Absolute: 0.3 10*3/uL (ref 0.0–0.5)
Eosinophils Relative: 5 %
HCT: 36.3 % — ABNORMAL LOW (ref 39.0–52.0)
Hemoglobin: 12.2 g/dL — ABNORMAL LOW (ref 13.0–17.0)
Immature Granulocytes: 0 %
Lymphocytes Relative: 17 %
Lymphs Abs: 1.1 10*3/uL (ref 0.7–4.0)
MCH: 32.1 pg (ref 26.0–34.0)
MCHC: 33.6 g/dL (ref 30.0–36.0)
MCV: 95.5 fL (ref 80.0–100.0)
Monocytes Absolute: 0.7 10*3/uL (ref 0.1–1.0)
Monocytes Relative: 11 %
Neutro Abs: 4.1 10*3/uL (ref 1.7–7.7)
Neutrophils Relative %: 67 %
Platelets: 207 10*3/uL (ref 150–400)
RBC: 3.8 MIL/uL — ABNORMAL LOW (ref 4.22–5.81)
RDW: 11.9 % (ref 11.5–15.5)
WBC: 6.1 10*3/uL (ref 4.0–10.5)
nRBC: 0 % (ref 0.0–0.2)

## 2018-10-25 LAB — BASIC METABOLIC PANEL WITH GFR
Anion gap: 9 (ref 5–15)
BUN: 27 mg/dL — ABNORMAL HIGH (ref 8–23)
CO2: 27 mmol/L (ref 22–32)
Calcium: 9.2 mg/dL (ref 8.9–10.3)
Chloride: 101 mmol/L (ref 98–111)
Creatinine, Ser: 1.55 mg/dL — ABNORMAL HIGH (ref 0.61–1.24)
GFR calc Af Amer: 55 mL/min — ABNORMAL LOW
GFR calc non Af Amer: 47 mL/min — ABNORMAL LOW
Glucose, Bld: 135 mg/dL — ABNORMAL HIGH (ref 70–99)
Potassium: 4.3 mmol/L (ref 3.5–5.1)
Sodium: 137 mmol/L (ref 135–145)

## 2018-10-25 MED ORDER — SODIUM CHLORIDE 0.9 % IV SOLN
Freq: Once | INTRAVENOUS | Status: AC
  Administered 2018-10-25: 12:00:00 via INTRAVENOUS
  Filled 2018-10-25: qty 5

## 2018-10-25 MED ORDER — SODIUM CHLORIDE 0.9 % IV SOLN
Freq: Once | INTRAVENOUS | Status: AC
  Administered 2018-10-25: 10:00:00 via INTRAVENOUS
  Filled 2018-10-25: qty 250

## 2018-10-25 MED ORDER — SODIUM CHLORIDE 0.9 % IV SOLN
30.0000 mg/m2 | Freq: Once | INTRAVENOUS | Status: AC
  Administered 2018-10-25: 13:00:00 66 mg via INTRAVENOUS
  Filled 2018-10-25: qty 66

## 2018-10-25 MED ORDER — PALONOSETRON HCL INJECTION 0.25 MG/5ML
0.2500 mg | Freq: Once | INTRAVENOUS | Status: AC
  Administered 2018-10-25: 12:00:00 0.25 mg via INTRAVENOUS
  Filled 2018-10-25: qty 5

## 2018-10-25 MED ORDER — POTASSIUM CHLORIDE 2 MEQ/ML IV SOLN
Freq: Once | INTRAVENOUS | Status: AC
  Administered 2018-10-25: 10:00:00 via INTRAVENOUS
  Filled 2018-10-25: qty 1000

## 2018-10-25 NOTE — Progress Notes (Signed)
Patient stated that he had been doing well with no complaints. Patient just wants to know if he is doing well overall.

## 2018-10-26 ENCOUNTER — Other Ambulatory Visit: Payer: Self-pay

## 2018-10-26 ENCOUNTER — Ambulatory Visit
Admission: RE | Admit: 2018-10-26 | Discharge: 2018-10-26 | Disposition: A | Discharge status: Home / Self Care | Payer: 59 | Source: Ambulatory Visit | Attending: Radiation Oncology | Admitting: Radiation Oncology

## 2018-10-26 DIAGNOSIS — C099 Malignant neoplasm of tonsil, unspecified: Secondary | ICD-10-CM | POA: Diagnosis not present

## 2018-10-27 ENCOUNTER — Ambulatory Visit
Admission: RE | Admit: 2018-10-27 | Discharge: 2018-10-27 | Disposition: A | Discharge status: Home / Self Care | Payer: 59 | Source: Ambulatory Visit | Attending: Radiation Oncology | Admitting: Radiation Oncology

## 2018-10-27 ENCOUNTER — Other Ambulatory Visit: Payer: Self-pay

## 2018-10-27 DIAGNOSIS — C099 Malignant neoplasm of tonsil, unspecified: Secondary | ICD-10-CM | POA: Diagnosis not present

## 2018-10-30 ENCOUNTER — Other Ambulatory Visit: Payer: Self-pay

## 2018-10-30 ENCOUNTER — Ambulatory Visit
Admission: RE | Admit: 2018-10-30 | Discharge: 2018-10-30 | Disposition: A | Discharge status: Home / Self Care | Payer: 59 | Source: Ambulatory Visit | Attending: Radiation Oncology | Admitting: Radiation Oncology

## 2018-10-30 ENCOUNTER — Inpatient Hospital Stay: Payer: 59

## 2018-10-30 VITALS — BP 143/87 | HR 55 | Temp 97.8°F | Resp 18

## 2018-10-30 DIAGNOSIS — Z5111 Encounter for antineoplastic chemotherapy: Secondary | ICD-10-CM | POA: Diagnosis not present

## 2018-10-30 DIAGNOSIS — C099 Malignant neoplasm of tonsil, unspecified: Secondary | ICD-10-CM

## 2018-10-30 MED ORDER — SODIUM CHLORIDE 0.9 % IV SOLN
Freq: Once | INTRAVENOUS | Status: AC
  Administered 2018-10-30: 11:00:00 via INTRAVENOUS
  Filled 2018-10-30: qty 250

## 2018-10-31 ENCOUNTER — Other Ambulatory Visit: Payer: Self-pay

## 2018-10-31 ENCOUNTER — Ambulatory Visit
Admission: RE | Admit: 2018-10-31 | Discharge: 2018-10-31 | Disposition: A | Discharge status: Home / Self Care | Payer: 59 | Source: Ambulatory Visit | Attending: Radiation Oncology | Admitting: Radiation Oncology

## 2018-10-31 DIAGNOSIS — C099 Malignant neoplasm of tonsil, unspecified: Secondary | ICD-10-CM | POA: Diagnosis not present

## 2018-11-01 ENCOUNTER — Inpatient Hospital Stay (HOSPITAL_BASED_OUTPATIENT_CLINIC_OR_DEPARTMENT_OTHER): Payer: 59 | Admitting: Oncology

## 2018-11-01 ENCOUNTER — Ambulatory Visit
Admission: RE | Admit: 2018-11-01 | Discharge: 2018-11-01 | Disposition: A | Discharge status: Home / Self Care | Payer: 59 | Source: Ambulatory Visit | Attending: Radiation Oncology | Admitting: Radiation Oncology

## 2018-11-01 ENCOUNTER — Encounter: Payer: Self-pay | Admitting: Oncology

## 2018-11-01 ENCOUNTER — Other Ambulatory Visit: Payer: Self-pay

## 2018-11-01 ENCOUNTER — Inpatient Hospital Stay: Payer: 59

## 2018-11-01 VITALS — BP 142/87 | HR 51 | Temp 97.5°F | Ht 69.0 in | Wt 216.0 lb

## 2018-11-01 DIAGNOSIS — D649 Anemia, unspecified: Secondary | ICD-10-CM

## 2018-11-01 DIAGNOSIS — C099 Malignant neoplasm of tonsil, unspecified: Secondary | ICD-10-CM | POA: Diagnosis not present

## 2018-11-01 DIAGNOSIS — R7989 Other specified abnormal findings of blood chemistry: Secondary | ICD-10-CM

## 2018-11-01 DIAGNOSIS — Z8261 Family history of arthritis: Secondary | ICD-10-CM

## 2018-11-01 DIAGNOSIS — D696 Thrombocytopenia, unspecified: Secondary | ICD-10-CM

## 2018-11-01 DIAGNOSIS — I10 Essential (primary) hypertension: Secondary | ICD-10-CM | POA: Diagnosis not present

## 2018-11-01 DIAGNOSIS — Z8249 Family history of ischemic heart disease and other diseases of the circulatory system: Secondary | ICD-10-CM

## 2018-11-01 DIAGNOSIS — Z5111 Encounter for antineoplastic chemotherapy: Secondary | ICD-10-CM | POA: Diagnosis not present

## 2018-11-01 DIAGNOSIS — Z808 Family history of malignant neoplasm of other organs or systems: Secondary | ICD-10-CM

## 2018-11-01 DIAGNOSIS — Z79899 Other long term (current) drug therapy: Secondary | ICD-10-CM

## 2018-11-01 DIAGNOSIS — Z87891 Personal history of nicotine dependence: Secondary | ICD-10-CM

## 2018-11-01 DIAGNOSIS — I2510 Atherosclerotic heart disease of native coronary artery without angina pectoris: Secondary | ICD-10-CM

## 2018-11-01 LAB — CBC WITH DIFFERENTIAL/PLATELET
Abs Immature Granulocytes: 0.01 10*3/uL (ref 0.00–0.07)
Basophils Absolute: 0 10*3/uL (ref 0.0–0.1)
Basophils Relative: 1 %
Eosinophils Absolute: 0.3 10*3/uL (ref 0.0–0.5)
Eosinophils Relative: 5 %
HCT: 32.5 % — ABNORMAL LOW (ref 39.0–52.0)
Hemoglobin: 11 g/dL — ABNORMAL LOW (ref 13.0–17.0)
Immature Granulocytes: 0 %
Lymphocytes Relative: 18 %
Lymphs Abs: 1 10*3/uL (ref 0.7–4.0)
MCH: 32.2 pg (ref 26.0–34.0)
MCHC: 33.8 g/dL (ref 30.0–36.0)
MCV: 95 fL (ref 80.0–100.0)
Monocytes Absolute: 0.7 10*3/uL (ref 0.1–1.0)
Monocytes Relative: 13 %
Neutro Abs: 3.7 10*3/uL (ref 1.7–7.7)
Neutrophils Relative %: 63 %
Platelets: 143 10*3/uL — ABNORMAL LOW (ref 150–400)
RBC: 3.42 MIL/uL — ABNORMAL LOW (ref 4.22–5.81)
RDW: 12.2 % (ref 11.5–15.5)
WBC: 5.7 10*3/uL (ref 4.0–10.5)
nRBC: 0 % (ref 0.0–0.2)

## 2018-11-01 LAB — BASIC METABOLIC PANEL
Anion gap: 9 (ref 5–15)
BUN: 32 mg/dL — ABNORMAL HIGH (ref 8–23)
CO2: 26 mmol/L (ref 22–32)
Calcium: 8.5 mg/dL — ABNORMAL LOW (ref 8.9–10.3)
Chloride: 102 mmol/L (ref 98–111)
Creatinine, Ser: 1.75 mg/dL — ABNORMAL HIGH (ref 0.61–1.24)
GFR calc Af Amer: 47 mL/min — ABNORMAL LOW (ref >60)
GFR calc non Af Amer: 41 mL/min — ABNORMAL LOW (ref >60)
Glucose, Bld: 103 mg/dL — ABNORMAL HIGH (ref 70–99)
Potassium: 4.1 mmol/L (ref 3.5–5.1)
Sodium: 137 mmol/L (ref 135–145)

## 2018-11-01 MED ORDER — SODIUM CHLORIDE 0.9 % IV SOLN
30.0000 mg/m2 | Freq: Once | INTRAVENOUS | Status: AC
  Administered 2018-11-01: 66 mg via INTRAVENOUS
  Filled 2018-11-01: qty 66

## 2018-11-01 MED ORDER — POTASSIUM CHLORIDE 2 MEQ/ML IV SOLN
Freq: Once | INTRAVENOUS | Status: AC
  Administered 2018-11-01: 10:00:00 via INTRAVENOUS
  Filled 2018-11-01: qty 1000

## 2018-11-01 MED ORDER — SODIUM CHLORIDE 0.9 % IV SOLN
Freq: Once | INTRAVENOUS | Status: AC
  Administered 2018-11-01: 12:00:00 via INTRAVENOUS
  Filled 2018-11-01: qty 5

## 2018-11-01 MED ORDER — PALONOSETRON HCL INJECTION 0.25 MG/5ML
0.2500 mg | Freq: Once | INTRAVENOUS | Status: AC
  Administered 2018-11-01: 0.25 mg via INTRAVENOUS
  Filled 2018-11-01: qty 5

## 2018-11-01 MED ORDER — SODIUM CHLORIDE 0.9 % IV SOLN
Freq: Once | INTRAVENOUS | Status: AC
  Administered 2018-11-01: 10:00:00 via INTRAVENOUS
  Filled 2018-11-01: qty 250

## 2018-11-01 NOTE — Progress Notes (Signed)
Patient stated that he had been doing well with no complaints. Patient also wanted to know if once he is done with treatments, if the cancer would come back or not.

## 2018-11-02 ENCOUNTER — Ambulatory Visit
Admission: RE | Admit: 2018-11-02 | Discharge: 2018-11-02 | Disposition: A | Discharge status: Home / Self Care | Payer: 59 | Source: Ambulatory Visit | Attending: Radiation Oncology | Admitting: Radiation Oncology

## 2018-11-02 ENCOUNTER — Other Ambulatory Visit: Payer: Self-pay

## 2018-11-02 DIAGNOSIS — C099 Malignant neoplasm of tonsil, unspecified: Secondary | ICD-10-CM | POA: Diagnosis not present

## 2018-11-02 NOTE — Progress Notes (Signed)
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   INTERVAL HISTORY: Patient returns to clinic today for further evaluation and consideration of cycle 4 of weekly cisplatin.  He is tolerating his dose reduced treatments without significant side effects.  He continues to feel well and is asymptomatic. He denies any dysphasia.  He has no neurologic complaints.  He denies any recent fevers or illnesses.  He has a good appetite and denies weight loss.  He has no chest pain, shortness of breath, cough, or hemoptysis.  He denies any nausea, vomiting, constipation, or diarrhea.  He has no urinary complaints.  Patient offers no specific complaints today.  REVIEW OF SYSTEMS:   Review of Systems  Constitutional: Negative.  Negative for fever, malaise/fatigue and weight loss.  Respiratory: Negative.  Negative for cough, hemoptysis and shortness of breath.   Cardiovascular: Negative.  Negative for chest pain and leg swelling.  Gastrointestinal: Negative.  Negative for abdominal pain.  Genitourinary: Negative.  Negative for dysuria.  Musculoskeletal: Negative.  Negative for neck pain.  Skin: Negative.  Negative for rash.  Neurological: Negative.  Negative for dizziness, focal weakness, weakness and headaches.  Psychiatric/Behavioral: Negative.  The patient is not nervous/anxious.     As per HPI. Otherwise, a complete review of systems is negative.  PAST MEDICAL HISTORY: Past Medical History:  Diagnosis Date  . Coronary artery disease   . Hypertension     PAST SURGICAL HISTORY: Past Surgical History:  Procedure Laterality Date  . CORONARY ARTERY BYPASS GRAFT  2010   Quadruple    FAMILY HISTORY: Family History  Problem Relation Age of Onset   . Arthritis Mother   . Heart attack Father   . Brain cancer Sister     ADVANCED DIRECTIVES (Y/N):  N  HEALTH MAINTENANCE: Social History   Tobacco Use  . Smoking status: Former Smoker    Types: Cigarettes    Quit date: 10/17/2008    Years since quitting: 10.0  . Smokeless tobacco: Never Used  Substance Use Topics  . Alcohol use: Yes    Comment: occasionally  . Drug use: Never     Colonoscopy:  PAP:  Bone density:  Lipid panel:  No Known Allergies  Current Outpatient Medications  Medication Sig Dispense Refill  . amLODipine (NORVASC) 10 MG tablet     . aspirin 325 MG tablet Take 325 mg by mouth daily.    Marland Kitchen atorvastatin (LIPITOR) 80 MG tablet Take 1 tablet by mouth 1 day or 1 dose.    . DENTA 5000 PLUS 1.1 % CREA dental cream BRUSH WITH PASTE 2 3 TIMES PER DAY FOR AT LEAST 1 MINUTE    . lovastatin (MEVACOR) 40 MG tablet Take 40 mg by mouth at bedtime.    . metoprolol succinate (TOPROL-XL) 25 MG 24 hr tablet Take 1 tablet by mouth 1 day or 1 dose.    . olmesartan-hydrochlorothiazide (BENICAR HCT) 40-12.5 MG tablet Take 1 tablet by mouth 1 day or 1 dose.    . ondansetron (ZOFRAN) 8 MG tablet Take 1 tablet (8 mg total) by mouth 2 (two) times daily as needed. 30 tablet 1  . prochlorperazine (COMPAZINE) 10 MG tablet Take 1 tablet (10 mg total) by mouth every 6 (six) hours as needed (Nausea or  vomiting). 30 tablet 1  . sucralfate (CARAFATE) 1 g tablet Take 1 tablet (1 g total) by mouth 3 (three) times daily before meals. Dissolve in warm water, gargle/swallow 90 tablet 6  . ALPRAZolam (XANAX) 0.5 MG tablet Take 1 tablet (0.5 mg total) by mouth daily as needed for anxiety. Take 30 minutes prior to radiation (Patient not taking: Reported on 11/01/2018) 30 tablet 0  . ascorbic acid (VITAMIN C) 1000 MG tablet Take 1 tablet by mouth.     No current facility-administered medications for this visit.     OBJECTIVE: Vitals:   11/01/18 0856  BP: (!) 142/87  Pulse: (!) 51  Temp: (!)  97.5 F (36.4 C)     Body mass index is 31.9 kg/m.    ECOG FS:0 - Asymptomatic  General: Well-developed, well-nourished, no acute distress. Eyes: Pink conjunctiva, anicteric sclera. HEENT: Normocephalic, moist mucous membranes, clear oropharnyx.  Left cervical lymph node decreased in size. Lungs: Clear to auscultation bilaterally. Heart: Regular rate and rhythm. No rubs, murmurs, or gallops. Abdomen: Soft, nontender, nondistended. No organomegaly noted, normoactive bowel sounds. Musculoskeletal: No edema, cyanosis, or clubbing. Neuro: Alert, answering all questions appropriately. Cranial nerves grossly intact. Skin: No rashes or petechiae noted. Psych: Normal affect.  LAB RESULTS:  Lab Results  Component Value Date   NA 137 11/01/2018   K 4.1 11/01/2018   CL 102 11/01/2018   CO2 26 11/01/2018   GLUCOSE 103 (H) 11/01/2018   BUN 32 (H) 11/01/2018   CREATININE 1.75 (H) 11/01/2018   CALCIUM 8.5 (L) 11/01/2018   GFRNONAA 41 (L) 11/01/2018   GFRAA 47 (L) 11/01/2018    Lab Results  Component Value Date   WBC 5.7 11/01/2018   NEUTROABS 3.7 11/01/2018   HGB 11.0 (L) 11/01/2018   HCT 32.5 (L) 11/01/2018   MCV 95.0 11/01/2018   PLT 143 (L) 11/01/2018     STUDIES: No results found.  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   PLAN:    1. 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  Repeat biopsy confirmed P16+.  Continue daily XRT.  Patient's creatinine has trended back up slightly to 1.75. Continue to proceed cautiously with creatinine using dose reduction of 30 mg/m2.  Return to clinic on Monday for IV fluids.  Patient then return to clinic in 1 week for further evaluation and consideration of cycle 5.  If his creatinine continues to improve, will increase cisplatin back to his original  starting dose at 40 mg/m.   2.  Elevated creatinine: Slightly worse this week.  Proceed cautiously with cisplatin as above and return for IV fluids next week. 3.  Anemia: Patient's hemoglobin has trended down slightly to 11.0.  Monitor. 4.  Thrombocytopenia: Mild, monitor.  Patient expressed understanding and was in agreement with this plan. He also understands that He can call clinic at any time with any questions, concerns, or complaints.   Cancer Staging Squamous cell carcinoma of left tonsil ([place]) Staging form: Pharynx - HPV-Mediated Oropharynx, AJCC 8th Edition - Clinical: No stage assigned - Unsigned   Lloyd Huger, MD   11/02/2018 6:30 AM

## 2018-11-03 ENCOUNTER — Ambulatory Visit
Admission: RE | Admit: 2018-11-03 | Discharge: 2018-11-03 | Disposition: A | Discharge status: Home / Self Care | Payer: 59 | Source: Ambulatory Visit | Attending: Radiation Oncology | Admitting: Radiation Oncology

## 2018-11-03 ENCOUNTER — Other Ambulatory Visit: Payer: Self-pay

## 2018-11-03 DIAGNOSIS — C099 Malignant neoplasm of tonsil, unspecified: Secondary | ICD-10-CM | POA: Diagnosis not present

## 2018-11-04 NOTE — Progress Notes (Signed)
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   INTERVAL HISTORY: Patient returns to clinic today for further evaluation and consideration of cycle 5 of weekly cisplatin.  He continues to feel well and is asymptomatic.  He is tolerating his treatments without significant side effects.  He denies any dysphasia.  He has no neurologic complaints.  He denies any recent fevers or illnesses.  He has a good appetite and denies weight loss.  He has no chest pain, shortness of breath, cough, or hemoptysis.  He denies any nausea, vomiting, constipation, or diarrhea.  He has no urinary complaints.  Patient offers no specific complaints today.  REVIEW OF SYSTEMS:   Review of Systems  Constitutional: Negative.  Negative for fever, malaise/fatigue and weight loss.  Respiratory: Negative.  Negative for cough, hemoptysis and shortness of breath.   Cardiovascular: Negative.  Negative for chest pain and leg swelling.  Gastrointestinal: Negative.  Negative for abdominal pain.  Genitourinary: Negative.  Negative for dysuria.  Musculoskeletal: Negative.  Negative for neck pain.  Skin: Negative.  Negative for rash.  Neurological: Negative.  Negative for dizziness, focal weakness, weakness and headaches.  Psychiatric/Behavioral: Negative.  The patient is not nervous/anxious.     As per HPI. Otherwise, a complete review of systems is negative.  PAST MEDICAL HISTORY: Past Medical History:  Diagnosis Date  . Coronary artery disease   . Hypertension     PAST SURGICAL HISTORY: Past Surgical History:  Procedure Laterality Date  . CORONARY ARTERY BYPASS GRAFT  2010   Quadruple    FAMILY HISTORY: Family History  Problem Relation Age of Onset  . Arthritis  Mother   . Heart attack Father   . Brain cancer Sister     ADVANCED DIRECTIVES (Y/N):  N  HEALTH MAINTENANCE: Social History   Tobacco Use  . Smoking status: Former Smoker    Types: Cigarettes    Quit date: 10/17/2008    Years since quitting: 10.0  . Smokeless tobacco: Never Used  Substance Use Topics  . Alcohol use: Yes    Comment: occasionally  . Drug use: Never     Colonoscopy:  PAP:  Bone density:  Lipid panel:  No Known Allergies  Current Outpatient Medications  Medication Sig Dispense Refill  . ALPRAZolam (XANAX) 0.5 MG tablet Take 1 tablet (0.5 mg total) by mouth daily as needed for anxiety. Take 30 minutes prior to radiation 30 tablet 0  . amLODipine (NORVASC) 10 MG tablet     . ascorbic acid (VITAMIN C) 1000 MG tablet Take 1 tablet by mouth.    Marland Kitchen aspirin 325 MG tablet Take 325 mg by mouth daily.    Marland Kitchen atorvastatin (LIPITOR) 80 MG tablet Take 1 tablet by mouth 1 day or 1 dose.    . DENTA 5000 PLUS 1.1 % CREA dental cream BRUSH WITH PASTE 2 3 TIMES PER DAY FOR AT LEAST 1 MINUTE    . lovastatin (MEVACOR) 40 MG tablet Take 40 mg by mouth at bedtime.    . metoprolol succinate (TOPROL-XL) 25 MG 24 hr tablet Take 1 tablet by mouth 1 day or 1 dose.    . olmesartan-hydrochlorothiazide (BENICAR HCT) 40-12.5 MG tablet Take 1 tablet by mouth 1 day or 1 dose.    . ondansetron (  ZOFRAN) 8 MG tablet Take 1 tablet (8 mg total) by mouth 2 (two) times daily as needed. 30 tablet 1  . prochlorperazine (COMPAZINE) 10 MG tablet Take 1 tablet (10 mg total) by mouth every 6 (six) hours as needed (Nausea or vomiting). 30 tablet 1  . sucralfate (CARAFATE) 1 g tablet Take 1 tablet (1 g total) by mouth 3 (three) times daily before meals. Dissolve in warm water, gargle/swallow 90 tablet 6   No current facility-administered medications for this visit.     OBJECTIVE: Vitals:   11/08/18 0845  BP: 128/83  Pulse: (!) 57  Temp: (!) 97.4 F (36.3 C)     Body mass index is 31.6 kg/m.    ECOG  FS:0 - Asymptomatic  General: Well-developed, well-nourished, no acute distress. Eyes: Pink conjunctiva, anicteric sclera. HEENT: Normocephalic, moist mucous membranes, clear oropharnyx.  Left cervical lymph node minimally palpable. Lungs: Clear to auscultation bilaterally. Heart: Regular rate and rhythm. No rubs, murmurs, or gallops. Abdomen: Soft, nontender, nondistended. No organomegaly noted, normoactive bowel sounds. Musculoskeletal: No edema, cyanosis, or clubbing. Neuro: Alert, answering all questions appropriately. Cranial nerves grossly intact. Skin: No rashes or petechiae noted. Psych: Normal affect.  LAB RESULTS:  Lab Results  Component Value Date   NA 137 11/08/2018   K 3.6 11/08/2018   CL 102 11/08/2018   CO2 26 11/08/2018   GLUCOSE 105 (H) 11/08/2018   BUN 28 (H) 11/08/2018   CREATININE 1.85 (H) 11/08/2018   CALCIUM 8.4 (L) 11/08/2018   GFRNONAA 38 (L) 11/08/2018   GFRAA 44 (L) 11/08/2018    Lab Results  Component Value Date   WBC 5.1 11/08/2018   NEUTROABS 3.7 11/08/2018   HGB 10.6 (L) 11/08/2018   HCT 30.7 (L) 11/08/2018   MCV 93.9 11/08/2018   PLT 117 (L) 11/08/2018     STUDIES: No results found.  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   PLAN:    1. 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  Repeat biopsy confirmed P16+.  Continue daily XRT.  Patient's creatinine has trended up slightly, but is essentially stable at 1.85.  Continue to proceed cautiously with creatinine using dose reduction of 30 mg/m2.  Return to clinic on Monday for IV fluids.  Return to clinic in 1 week for further evaluation and consideration of cycle 6. 2.  Elevated creatinine: Slightly worse this week.  Proceed cautiously with cisplatin as above and return for IV fluids next week. 3.   Anemia: Patient's hemoglobin continues to slowly trend down and is now 10.6.  Monitor. 4.  Thrombocytopenia: Mild, monitor.  Proceed with treatment as above.  Patient expressed understanding and was in agreement with this plan. He also understands that He can call clinic at any time with any questions, concerns, or complaints.   Cancer Staging Squamous cell carcinoma of left tonsil ([place]) Staging form: Pharynx - HPV-Mediated Oropharynx, AJCC 8th Edition - Clinical: No stage assigned - Unsigned   Lloyd Huger, MD   11/09/2018 6:53 AM

## 2018-11-06 ENCOUNTER — Inpatient Hospital Stay: Payer: 59

## 2018-11-06 ENCOUNTER — Ambulatory Visit
Admission: RE | Admit: 2018-11-06 | Discharge: 2018-11-06 | Disposition: A | Discharge status: Home / Self Care | Payer: 59 | Source: Ambulatory Visit | Attending: Radiation Oncology | Admitting: Radiation Oncology

## 2018-11-06 ENCOUNTER — Other Ambulatory Visit: Payer: Self-pay

## 2018-11-06 VITALS — BP 142/86 | HR 56 | Temp 97.9°F | Resp 18

## 2018-11-06 DIAGNOSIS — Z5111 Encounter for antineoplastic chemotherapy: Secondary | ICD-10-CM | POA: Diagnosis not present

## 2018-11-06 DIAGNOSIS — C099 Malignant neoplasm of tonsil, unspecified: Secondary | ICD-10-CM | POA: Diagnosis not present

## 2018-11-06 MED ORDER — SODIUM CHLORIDE 0.9 % IV SOLN
Freq: Once | INTRAVENOUS | Status: AC
  Administered 2018-11-06: 1000 mL via INTRAVENOUS
  Filled 2018-11-06: qty 250

## 2018-11-07 ENCOUNTER — Other Ambulatory Visit: Payer: Self-pay

## 2018-11-07 ENCOUNTER — Ambulatory Visit
Admission: RE | Admit: 2018-11-07 | Discharge: 2018-11-07 | Disposition: A | Discharge status: Home / Self Care | Payer: 59 | Source: Ambulatory Visit | Attending: Radiation Oncology | Admitting: Radiation Oncology

## 2018-11-07 DIAGNOSIS — C099 Malignant neoplasm of tonsil, unspecified: Secondary | ICD-10-CM | POA: Diagnosis not present

## 2018-11-08 ENCOUNTER — Other Ambulatory Visit: Payer: Self-pay

## 2018-11-08 ENCOUNTER — Inpatient Hospital Stay (HOSPITAL_BASED_OUTPATIENT_CLINIC_OR_DEPARTMENT_OTHER): Payer: 59 | Admitting: Oncology

## 2018-11-08 ENCOUNTER — Encounter: Payer: Self-pay | Admitting: Oncology

## 2018-11-08 ENCOUNTER — Ambulatory Visit
Admission: RE | Admit: 2018-11-08 | Discharge: 2018-11-08 | Disposition: A | Discharge status: Home / Self Care | Payer: 59 | Source: Ambulatory Visit | Attending: Radiation Oncology | Admitting: Radiation Oncology

## 2018-11-08 ENCOUNTER — Inpatient Hospital Stay: Payer: 59

## 2018-11-08 VITALS — BP 128/83 | HR 57 | Temp 97.4°F | Ht 69.0 in | Wt 214.0 lb

## 2018-11-08 DIAGNOSIS — C099 Malignant neoplasm of tonsil, unspecified: Secondary | ICD-10-CM

## 2018-11-08 DIAGNOSIS — Z8261 Family history of arthritis: Secondary | ICD-10-CM

## 2018-11-08 DIAGNOSIS — Z87891 Personal history of nicotine dependence: Secondary | ICD-10-CM

## 2018-11-08 DIAGNOSIS — Z8249 Family history of ischemic heart disease and other diseases of the circulatory system: Secondary | ICD-10-CM

## 2018-11-08 DIAGNOSIS — Z79899 Other long term (current) drug therapy: Secondary | ICD-10-CM

## 2018-11-08 DIAGNOSIS — Z808 Family history of malignant neoplasm of other organs or systems: Secondary | ICD-10-CM

## 2018-11-08 DIAGNOSIS — R7989 Other specified abnormal findings of blood chemistry: Secondary | ICD-10-CM | POA: Diagnosis not present

## 2018-11-08 DIAGNOSIS — D696 Thrombocytopenia, unspecified: Secondary | ICD-10-CM

## 2018-11-08 DIAGNOSIS — D649 Anemia, unspecified: Secondary | ICD-10-CM

## 2018-11-08 DIAGNOSIS — Z5111 Encounter for antineoplastic chemotherapy: Secondary | ICD-10-CM

## 2018-11-08 DIAGNOSIS — I2510 Atherosclerotic heart disease of native coronary artery without angina pectoris: Secondary | ICD-10-CM

## 2018-11-08 DIAGNOSIS — I10 Essential (primary) hypertension: Secondary | ICD-10-CM

## 2018-11-08 LAB — CBC WITH DIFFERENTIAL/PLATELET
Abs Immature Granulocytes: 0.01 10*3/uL (ref 0.00–0.07)
Basophils Absolute: 0 10*3/uL (ref 0.0–0.1)
Basophils Relative: 0 %
Eosinophils Absolute: 0.1 10*3/uL (ref 0.0–0.5)
Eosinophils Relative: 1 %
HCT: 30.7 % — ABNORMAL LOW (ref 39.0–52.0)
Hemoglobin: 10.6 g/dL — ABNORMAL LOW (ref 13.0–17.0)
Immature Granulocytes: 0 %
Lymphocytes Relative: 15 %
Lymphs Abs: 0.8 10*3/uL (ref 0.7–4.0)
MCH: 32.4 pg (ref 26.0–34.0)
MCHC: 34.5 g/dL (ref 30.0–36.0)
MCV: 93.9 fL (ref 80.0–100.0)
Monocytes Absolute: 0.6 10*3/uL (ref 0.1–1.0)
Monocytes Relative: 12 %
Neutro Abs: 3.7 10*3/uL (ref 1.7–7.7)
Neutrophils Relative %: 72 %
Platelets: 117 10*3/uL — ABNORMAL LOW (ref 150–400)
RBC: 3.27 MIL/uL — ABNORMAL LOW (ref 4.22–5.81)
RDW: 12.1 % (ref 11.5–15.5)
WBC: 5.1 10*3/uL (ref 4.0–10.5)
nRBC: 0 % (ref 0.0–0.2)

## 2018-11-08 LAB — BASIC METABOLIC PANEL
Anion gap: 9 (ref 5–15)
BUN: 28 mg/dL — ABNORMAL HIGH (ref 8–23)
CO2: 26 mmol/L (ref 22–32)
Calcium: 8.4 mg/dL — ABNORMAL LOW (ref 8.9–10.3)
Chloride: 102 mmol/L (ref 98–111)
Creatinine, Ser: 1.85 mg/dL — ABNORMAL HIGH (ref 0.61–1.24)
GFR calc Af Amer: 44 mL/min — ABNORMAL LOW (ref >60)
GFR calc non Af Amer: 38 mL/min — ABNORMAL LOW (ref >60)
Glucose, Bld: 105 mg/dL — ABNORMAL HIGH (ref 70–99)
Potassium: 3.6 mmol/L (ref 3.5–5.1)
Sodium: 137 mmol/L (ref 135–145)

## 2018-11-08 MED ORDER — SODIUM CHLORIDE 0.9 % IV SOLN
30.0000 mg/m2 | Freq: Once | INTRAVENOUS | Status: AC
  Administered 2018-11-08: 66 mg via INTRAVENOUS
  Filled 2018-11-08: qty 66

## 2018-11-08 MED ORDER — PALONOSETRON HCL INJECTION 0.25 MG/5ML
0.2500 mg | Freq: Once | INTRAVENOUS | Status: AC
  Administered 2018-11-08: 0.25 mg via INTRAVENOUS
  Filled 2018-11-08: qty 5

## 2018-11-08 MED ORDER — POTASSIUM CHLORIDE 2 MEQ/ML IV SOLN
Freq: Once | INTRAVENOUS | Status: AC
  Administered 2018-11-08: 10:00:00 via INTRAVENOUS
  Filled 2018-11-08: qty 1000

## 2018-11-08 MED ORDER — SODIUM CHLORIDE 0.9 % IV SOLN
Freq: Once | INTRAVENOUS | Status: AC
  Administered 2018-11-08: 12:00:00 via INTRAVENOUS
  Filled 2018-11-08: qty 5

## 2018-11-08 MED ORDER — SODIUM CHLORIDE 0.9 % IV SOLN
Freq: Once | INTRAVENOUS | Status: AC
  Administered 2018-11-08: 09:00:00 via INTRAVENOUS
  Filled 2018-11-08: qty 250

## 2018-11-08 NOTE — Progress Notes (Signed)
Creatinine: 1.85. MD, Dr. Grayland Ormond, notified and already aware. Per MD order: proceed with scheduled Cisplatin treatment today.

## 2018-11-08 NOTE — Progress Notes (Signed)
Verified with MD to maintain current dose of 30mg /m2 for this treatment due to increased SCr.

## 2018-11-08 NOTE — Progress Notes (Signed)
Patient stated that he had been doing okay. Patient stated that recently he's had hiccups lasting 4-5 hours and eventually it goes away. Patient stated that his taste buds are not as they use to be, however, he tries to eat. Patient also stated that he feels tired at times.

## 2018-11-09 ENCOUNTER — Ambulatory Visit
Admission: RE | Admit: 2018-11-09 | Discharge: 2018-11-09 | Disposition: A | Discharge status: Home / Self Care | Payer: 59 | Source: Ambulatory Visit | Attending: Radiation Oncology | Admitting: Radiation Oncology

## 2018-11-09 ENCOUNTER — Other Ambulatory Visit: Payer: Self-pay

## 2018-11-09 DIAGNOSIS — C099 Malignant neoplasm of tonsil, unspecified: Secondary | ICD-10-CM | POA: Diagnosis not present

## 2018-11-10 ENCOUNTER — Other Ambulatory Visit: Payer: Self-pay

## 2018-11-10 ENCOUNTER — Ambulatory Visit
Admission: RE | Admit: 2018-11-10 | Discharge: 2018-11-10 | Disposition: A | Discharge status: Home / Self Care | Payer: 59 | Source: Ambulatory Visit | Attending: Radiation Oncology | Admitting: Radiation Oncology

## 2018-11-10 DIAGNOSIS — C099 Malignant neoplasm of tonsil, unspecified: Secondary | ICD-10-CM | POA: Diagnosis not present

## 2018-11-12 NOTE — Progress Notes (Signed)
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   INTERVAL HISTORY: Patient returns to clinic today for further evaluation and consideration of cycle 6 of weekly cisplatin.  He continues to feel well and remains asymptomatic.  He is tolerating his treatments well without significant side effects. He denies any dysphasia.  He has no neurologic complaints.  He denies any recent fevers or illnesses.  He has a good appetite and denies weight loss.  He has no chest pain, shortness of breath, cough, or hemoptysis.  He denies any nausea, vomiting, constipation, or diarrhea.  He has no urinary complaints.  Patient feels at his baseline offers no specific complaints today.  REVIEW OF SYSTEMS:   Review of Systems  Constitutional: Negative.  Negative for fever, malaise/fatigue and weight loss.  Respiratory: Negative.  Negative for cough, hemoptysis and shortness of breath.   Cardiovascular: Negative.  Negative for chest pain and leg swelling.  Gastrointestinal: Negative.  Negative for abdominal pain.  Genitourinary: Negative.  Negative for dysuria.  Musculoskeletal: Negative.  Negative for neck pain.  Skin: Negative.  Negative for rash.  Neurological: Negative.  Negative for dizziness, focal weakness, weakness and headaches.  Psychiatric/Behavioral: Negative.  The patient is not nervous/anxious.     As per HPI. Otherwise, a complete review of systems is negative.  PAST MEDICAL HISTORY: Past Medical History:  Diagnosis Date  . Coronary artery disease   . Hypertension     PAST SURGICAL HISTORY: Past Surgical History:  Procedure Laterality Date  . CORONARY ARTERY BYPASS GRAFT  2010   Quadruple    FAMILY HISTORY: Family History  Problem  Relation Age of Onset  . Arthritis Mother   . Heart attack Father   . Brain cancer Sister     ADVANCED DIRECTIVES (Y/N):  N  HEALTH MAINTENANCE: Social History   Tobacco Use  . Smoking status: Former Smoker    Types: Cigarettes    Quit date: 10/17/2008    Years since quitting: 10.0  . Smokeless tobacco: Never Used  Substance Use Topics  . Alcohol use: Yes    Comment: occasionally  . Drug use: Never     Colonoscopy:  PAP:  Bone density:  Lipid panel:  No Known Allergies  Current Outpatient Medications  Medication Sig Dispense Refill  . ALPRAZolam (XANAX) 0.5 MG tablet Take 1 tablet (0.5 mg total) by mouth daily as needed for anxiety. Take 30 minutes prior to radiation 30 tablet 0  . amLODipine (NORVASC) 10 MG tablet     . ascorbic acid (VITAMIN C) 1000 MG tablet Take 1 tablet by mouth.    Marland Kitchen aspirin 325 MG tablet Take 325 mg by mouth daily.    Marland Kitchen atorvastatin (LIPITOR) 80 MG tablet Take 1 tablet by mouth 1 day or 1 dose.    . DENTA 5000 PLUS 1.1 % CREA dental cream BRUSH WITH PASTE 2 3 TIMES PER DAY FOR AT LEAST 1 MINUTE    . lovastatin (MEVACOR) 40 MG tablet Take 40 mg by mouth at bedtime.    . metoprolol succinate (TOPROL-XL) 25 MG 24 hr tablet Take 1 tablet by mouth 1 day or 1 dose.    . olmesartan-hydrochlorothiazide (BENICAR HCT) 40-12.5 MG tablet Take 1 tablet by mouth 1 day or 1 dose.    Marland Kitchen  ondansetron (ZOFRAN) 8 MG tablet Take 1 tablet (8 mg total) by mouth 2 (two) times daily as needed. 30 tablet 1  . prochlorperazine (COMPAZINE) 10 MG tablet Take 1 tablet (10 mg total) by mouth every 6 (six) hours as needed (Nausea or vomiting). 30 tablet 1  . sucralfate (CARAFATE) 1 g tablet Take 1 tablet (1 g total) by mouth 3 (three) times daily before meals. Dissolve in warm water, gargle/swallow 90 tablet 6   No current facility-administered medications for this visit.     OBJECTIVE: Vitals:   11/15/18 0923  BP: (!) 127/51  Pulse: (!) 56  Resp: 18  Temp: (!) 97 F (36.1  C)     Body mass index is 31.19 kg/m.    ECOG FS:0 - Asymptomatic  General: Well-developed, well-nourished, no acute distress. Eyes: Pink conjunctiva, anicteric sclera. HEENT: Normocephalic, moist mucous membranes, clear oropharnyx.  No palpable lymphadenopathy. Lungs: Clear to auscultation bilaterally. Heart: Regular rate and rhythm. No rubs, murmurs, or gallops. Abdomen: Soft, nontender, nondistended. No organomegaly noted, normoactive bowel sounds. Musculoskeletal: No edema, cyanosis, or clubbing. Neuro: Alert, answering all questions appropriately. Cranial nerves grossly intact. Skin: No rashes or petechiae noted. Psych: Normal affect.  LAB RESULTS:  Lab Results  Component Value Date   NA 133 (L) 11/15/2018   K 3.8 11/15/2018   CL 101 11/15/2018   CO2 25 11/15/2018   GLUCOSE 93 11/15/2018   BUN 26 (H) 11/15/2018   CREATININE 2.04 (H) 11/15/2018   CALCIUM 8.3 (L) 11/15/2018   GFRNONAA 34 (L) 11/15/2018   GFRAA 39 (L) 11/15/2018    Lab Results  Component Value Date   WBC 4.2 11/15/2018   NEUTROABS 2.8 11/15/2018   HGB 10.1 (L) 11/15/2018   HCT 29.4 (L) 11/15/2018   MCV 93.0 11/15/2018   PLT 114 (L) 11/15/2018     STUDIES: No results found.  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   PLAN:    1. 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  Repeat biopsy confirmed P16+.  Continue daily XRT completing on November 23, 2018.  Patient's creatinine has now trended back up to greater than 2.0, therefore will hold dose reduced cisplatin at this time.  Proceed with IV fluids today and return to clinic on Monday for additional IV fluids.  Return to clinic in 1 week for further evaluation and reconsideration of cycle 6.   2.  Elevated creatinine: Slightly worse this week.  Delay  treatment and IV fluids as above. 3.  Anemia: Patient's hemoglobin is slowly trending down is now 10.1.  Monitor. 4.  Thrombocytopenia: Mild, monitor.  Delay treatment as above.  Patient expressed understanding and was in agreement with this plan. He also understands that He can call clinic at any time with any questions, concerns, or complaints.   Cancer Staging Squamous cell carcinoma of left tonsil ([place]) Staging form: Pharynx - HPV-Mediated Oropharynx, AJCC 8th Edition - Clinical: No stage assigned - Unsigned   Lloyd Huger, MD   11/16/2018 9:18 AM

## 2018-11-13 ENCOUNTER — Other Ambulatory Visit: Payer: Self-pay

## 2018-11-13 ENCOUNTER — Ambulatory Visit
Admission: RE | Admit: 2018-11-13 | Discharge: 2018-11-13 | Disposition: A | Discharge status: Home / Self Care | Payer: 59 | Source: Ambulatory Visit | Attending: Radiation Oncology | Admitting: Radiation Oncology

## 2018-11-13 ENCOUNTER — Inpatient Hospital Stay: Payer: 59

## 2018-11-13 VITALS — BP 130/84 | HR 60 | Resp 18

## 2018-11-13 DIAGNOSIS — Z5111 Encounter for antineoplastic chemotherapy: Secondary | ICD-10-CM | POA: Diagnosis not present

## 2018-11-13 DIAGNOSIS — C099 Malignant neoplasm of tonsil, unspecified: Secondary | ICD-10-CM

## 2018-11-13 MED ORDER — SODIUM CHLORIDE 0.9 % IV SOLN
Freq: Once | INTRAVENOUS | Status: AC
  Administered 2018-11-13: 11:00:00 via INTRAVENOUS
  Filled 2018-11-13: qty 250

## 2018-11-14 ENCOUNTER — Ambulatory Visit
Admission: RE | Admit: 2018-11-14 | Discharge: 2018-11-14 | Disposition: A | Discharge status: Home / Self Care | Payer: 59 | Source: Ambulatory Visit | Attending: Radiation Oncology | Admitting: Radiation Oncology

## 2018-11-14 ENCOUNTER — Other Ambulatory Visit: Payer: Self-pay

## 2018-11-14 DIAGNOSIS — C099 Malignant neoplasm of tonsil, unspecified: Secondary | ICD-10-CM | POA: Diagnosis not present

## 2018-11-15 ENCOUNTER — Inpatient Hospital Stay: Payer: 59

## 2018-11-15 ENCOUNTER — Other Ambulatory Visit: Payer: Self-pay

## 2018-11-15 ENCOUNTER — Inpatient Hospital Stay (HOSPITAL_BASED_OUTPATIENT_CLINIC_OR_DEPARTMENT_OTHER): Payer: 59 | Admitting: Oncology

## 2018-11-15 ENCOUNTER — Encounter: Payer: Self-pay | Admitting: Oncology

## 2018-11-15 ENCOUNTER — Ambulatory Visit
Admission: RE | Admit: 2018-11-15 | Discharge: 2018-11-15 | Disposition: A | Discharge status: Home / Self Care | Payer: 59 | Source: Ambulatory Visit | Attending: Radiation Oncology | Admitting: Radiation Oncology

## 2018-11-15 VITALS — BP 127/51 | HR 56 | Temp 97.0°F | Resp 18 | Wt 211.2 lb

## 2018-11-15 DIAGNOSIS — Z5111 Encounter for antineoplastic chemotherapy: Secondary | ICD-10-CM | POA: Diagnosis not present

## 2018-11-15 DIAGNOSIS — C099 Malignant neoplasm of tonsil, unspecified: Secondary | ICD-10-CM

## 2018-11-15 DIAGNOSIS — D649 Anemia, unspecified: Secondary | ICD-10-CM | POA: Diagnosis not present

## 2018-11-15 DIAGNOSIS — I2510 Atherosclerotic heart disease of native coronary artery without angina pectoris: Secondary | ICD-10-CM

## 2018-11-15 DIAGNOSIS — R7989 Other specified abnormal findings of blood chemistry: Secondary | ICD-10-CM | POA: Diagnosis not present

## 2018-11-15 DIAGNOSIS — Z79899 Other long term (current) drug therapy: Secondary | ICD-10-CM

## 2018-11-15 DIAGNOSIS — D696 Thrombocytopenia, unspecified: Secondary | ICD-10-CM | POA: Diagnosis not present

## 2018-11-15 DIAGNOSIS — Z8249 Family history of ischemic heart disease and other diseases of the circulatory system: Secondary | ICD-10-CM

## 2018-11-15 DIAGNOSIS — Z808 Family history of malignant neoplasm of other organs or systems: Secondary | ICD-10-CM

## 2018-11-15 DIAGNOSIS — Z87891 Personal history of nicotine dependence: Secondary | ICD-10-CM

## 2018-11-15 DIAGNOSIS — Z8261 Family history of arthritis: Secondary | ICD-10-CM

## 2018-11-15 DIAGNOSIS — I10 Essential (primary) hypertension: Secondary | ICD-10-CM

## 2018-11-15 LAB — BASIC METABOLIC PANEL
Anion gap: 7 (ref 5–15)
BUN: 26 mg/dL — ABNORMAL HIGH (ref 8–23)
CO2: 25 mmol/L (ref 22–32)
Calcium: 8.3 mg/dL — ABNORMAL LOW (ref 8.9–10.3)
Chloride: 101 mmol/L (ref 98–111)
Creatinine, Ser: 2.04 mg/dL — ABNORMAL HIGH (ref 0.61–1.24)
GFR calc Af Amer: 39 mL/min — ABNORMAL LOW (ref >60)
GFR calc non Af Amer: 34 mL/min — ABNORMAL LOW (ref >60)
Glucose, Bld: 93 mg/dL (ref 70–99)
Potassium: 3.8 mmol/L (ref 3.5–5.1)
Sodium: 133 mmol/L — ABNORMAL LOW (ref 135–145)

## 2018-11-15 LAB — CBC WITH DIFFERENTIAL/PLATELET
Abs Immature Granulocytes: 0.01 10*3/uL (ref 0.00–0.07)
Basophils Absolute: 0 10*3/uL (ref 0.0–0.1)
Basophils Relative: 0 %
Eosinophils Absolute: 0.1 10*3/uL (ref 0.0–0.5)
Eosinophils Relative: 2 %
HCT: 29.4 % — ABNORMAL LOW (ref 39.0–52.0)
Hemoglobin: 10.1 g/dL — ABNORMAL LOW (ref 13.0–17.0)
Immature Granulocytes: 0 %
Lymphocytes Relative: 15 %
Lymphs Abs: 0.7 10*3/uL (ref 0.7–4.0)
MCH: 32 pg (ref 26.0–34.0)
MCHC: 34.4 g/dL (ref 30.0–36.0)
MCV: 93 fL (ref 80.0–100.0)
Monocytes Absolute: 0.6 10*3/uL (ref 0.1–1.0)
Monocytes Relative: 15 %
Neutro Abs: 2.8 10*3/uL (ref 1.7–7.7)
Neutrophils Relative %: 68 %
Platelets: 114 10*3/uL — ABNORMAL LOW (ref 150–400)
RBC: 3.16 MIL/uL — ABNORMAL LOW (ref 4.22–5.81)
RDW: 12.1 % (ref 11.5–15.5)
WBC: 4.2 10*3/uL (ref 4.0–10.5)
nRBC: 0 % (ref 0.0–0.2)

## 2018-11-15 MED ORDER — SODIUM CHLORIDE 0.9 % IV SOLN
Freq: Once | INTRAVENOUS | Status: AC
  Administered 2018-11-15: 10:00:00 via INTRAVENOUS
  Filled 2018-11-15: qty 250

## 2018-11-15 NOTE — Progress Notes (Signed)
No Cisplatin today per Dr. Grayland Ormond. Patient did receive one liter of NS.

## 2018-11-15 NOTE — Progress Notes (Signed)
Pt here for follow up and treatment. States is feeling tired today. No other complaints.

## 2018-11-16 ENCOUNTER — Ambulatory Visit
Admission: RE | Admit: 2018-11-16 | Discharge: 2018-11-16 | Disposition: A | Discharge status: Home / Self Care | Payer: 59 | Source: Ambulatory Visit | Attending: Radiation Oncology | Admitting: Radiation Oncology

## 2018-11-16 ENCOUNTER — Other Ambulatory Visit: Payer: Self-pay

## 2018-11-16 DIAGNOSIS — C099 Malignant neoplasm of tonsil, unspecified: Secondary | ICD-10-CM | POA: Diagnosis not present

## 2018-11-17 ENCOUNTER — Ambulatory Visit
Admission: RE | Admit: 2018-11-17 | Discharge: 2018-11-17 | Disposition: A | Discharge status: Home / Self Care | Payer: 59 | Source: Ambulatory Visit | Attending: Radiation Oncology | Admitting: Radiation Oncology

## 2018-11-17 ENCOUNTER — Other Ambulatory Visit: Payer: Self-pay

## 2018-11-17 DIAGNOSIS — C099 Malignant neoplasm of tonsil, unspecified: Secondary | ICD-10-CM | POA: Diagnosis not present

## 2018-11-19 NOTE — Progress Notes (Signed)
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   INTERVAL HISTORY: Patient returns to clinic today for further evaluation and reconsideration of cycle 6 of weekly cisplatin.  He continues to tolerate his treatments well without significant side effects.  He currently feels well and is asymptomatic. He denies any dysphasia.  He has no neurologic complaints.  He denies any recent fevers or illnesses.  He has a good appetite and denies weight loss.  He has no chest pain, shortness of breath, cough, or hemoptysis.  He denies any nausea, vomiting, constipation, or diarrhea.  He has no urinary complaints.  Patient offers no specific complaints today.  REVIEW OF SYSTEMS:   Review of Systems  Constitutional: Negative.  Negative for fever, malaise/fatigue and weight loss.  Respiratory: Negative.  Negative for cough, hemoptysis and shortness of breath.   Cardiovascular: Negative.  Negative for chest pain and leg swelling.  Gastrointestinal: Negative.  Negative for abdominal pain.  Genitourinary: Negative.  Negative for dysuria.  Musculoskeletal: Negative.  Negative for neck pain.  Skin: Negative.  Negative for rash.  Neurological: Negative.  Negative for dizziness, focal weakness, weakness and headaches.  Psychiatric/Behavioral: Negative.  The patient is not nervous/anxious.     As per HPI. Otherwise, a complete review of systems is negative.  PAST MEDICAL HISTORY: Past Medical History:  Diagnosis Date  . Coronary artery disease   . Hypertension     PAST SURGICAL HISTORY: Past Surgical History:  Procedure Laterality Date  . CORONARY ARTERY BYPASS GRAFT  2010   Quadruple    FAMILY HISTORY: Family History  Problem Relation Age of Onset   . Arthritis Mother   . Heart attack Father   . Brain cancer Sister     ADVANCED DIRECTIVES (Y/N):  N  HEALTH MAINTENANCE: Social History   Tobacco Use  . Smoking status: Former Smoker    Types: Cigarettes    Quit date: 10/17/2008    Years since quitting: 10.1  . Smokeless tobacco: Never Used  Substance Use Topics  . Alcohol use: Yes    Comment: occasionally  . Drug use: Never     Colonoscopy:  PAP:  Bone density:  Lipid panel:  No Known Allergies  Current Outpatient Medications  Medication Sig Dispense Refill  . ALPRAZolam (XANAX) 0.5 MG tablet Take 1 tablet (0.5 mg total) by mouth daily as needed for anxiety. Take 30 minutes prior to radiation 30 tablet 0  . amLODipine (NORVASC) 10 MG tablet     . ascorbic acid (VITAMIN C) 1000 MG tablet Take 1 tablet by mouth.    Marland Kitchen aspirin 325 MG tablet Take 325 mg by mouth daily.    Marland Kitchen atorvastatin (LIPITOR) 80 MG tablet Take 1 tablet by mouth 1 day or 1 dose.    . DENTA 5000 PLUS 1.1 % CREA dental cream BRUSH WITH PASTE 2 3 TIMES PER DAY FOR AT LEAST 1 MINUTE    . lovastatin (MEVACOR) 40 MG tablet Take 40 mg by mouth at bedtime.    . metoprolol succinate (TOPROL-XL) 25 MG 24 hr tablet Take 1 tablet by mouth 1 day or 1 dose.    . olmesartan-hydrochlorothiazide (BENICAR HCT) 40-12.5 MG tablet Take 1 tablet by mouth 1 day or 1 dose.    . ondansetron (  ZOFRAN) 8 MG tablet Take 1 tablet (8 mg total) by mouth 2 (two) times daily as needed. 30 tablet 1  . prochlorperazine (COMPAZINE) 10 MG tablet Take 1 tablet (10 mg total) by mouth every 6 (six) hours as needed (Nausea or vomiting). 30 tablet 1  . sucralfate (CARAFATE) 1 g tablet Take 1 tablet (1 g total) by mouth 3 (three) times daily before meals. Dissolve in warm water, gargle/swallow 90 tablet 6   No current facility-administered medications for this visit.     OBJECTIVE: Vitals:   11/22/18 0904  BP: 133/82  Temp: (!) 97.1 F (36.2 C)     Body mass index is 30.89 kg/m.    ECOG  FS:0 - Asymptomatic  General: Well-developed, well-nourished, no acute distress. Eyes: Pink conjunctiva, anicteric sclera. HEENT: Normocephalic, moist mucous membranes, clear oropharnyx.  No palpable lymphadenopathy. Lungs: Clear to auscultation bilaterally. Heart: Regular rate and rhythm. No rubs, murmurs, or gallops. Abdomen: Soft, nontender, nondistended. No organomegaly noted, normoactive bowel sounds. Musculoskeletal: No edema, cyanosis, or clubbing. Neuro: Alert, answering all questions appropriately. Cranial nerves grossly intact. Skin: No rashes or petechiae noted. Psych: Normal affect.  LAB RESULTS:  Lab Results  Component Value Date   NA 137 11/22/2018   K 3.8 11/22/2018   CL 102 11/22/2018   CO2 25 11/22/2018   GLUCOSE 117 (H) 11/22/2018   BUN 24 (H) 11/22/2018   CREATININE 1.96 (H) 11/22/2018   CALCIUM 8.8 (L) 11/22/2018   GFRNONAA 36 (L) 11/22/2018   GFRAA 41 (L) 11/22/2018    Lab Results  Component Value Date   WBC 3.4 (L) 11/22/2018   NEUTROABS 2.6 11/22/2018   HGB 10.0 (L) 11/22/2018   HCT 28.7 (L) 11/22/2018   MCV 93.8 11/22/2018   PLT 120 (L) 11/22/2018     STUDIES: No results found.  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   PLAN:    1. 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  Repeat biopsy confirmed P16+.  Continue daily XRT completing on November 23, 2018.  Patient's creatinine is less than 2.0, therefore will proceed with dose reduced cisplatin.  No further intervention is needed.  Return to clinic in 4 weeks with repeat laboratory work and further evaluation.  Plan to repeat PET scan in approximately 8 weeks.  2.  Elevated creatinine: Proceed cautiously with patient's last infusion of cisplatin.  If creatinine does not improve after completing  treatment, consider referral to nephrology. 3.  Anemia: Hemoglobin is decreased, but stable at 10.0.  Monitor. 4.  Thrombocytopenia: Mild, monitor.  Proceed with treatment as above.  Patient expressed understanding and was in agreement with this plan. He also understands that He can call clinic at any time with any questions, concerns, or complaints.   Cancer Staging Squamous cell carcinoma of left tonsil ([place]) Staging form: Pharynx - HPV-Mediated Oropharynx, AJCC 8th Edition - Clinical: No stage assigned - Unsigned   Lloyd Huger, MD   11/23/2018 6:24 AM

## 2018-11-20 ENCOUNTER — Ambulatory Visit
Admission: RE | Admit: 2018-11-20 | Discharge: 2018-11-20 | Disposition: A | Discharge status: Home / Self Care | Payer: 59 | Source: Ambulatory Visit | Attending: Radiation Oncology | Admitting: Radiation Oncology

## 2018-11-20 ENCOUNTER — Other Ambulatory Visit: Payer: Self-pay

## 2018-11-20 ENCOUNTER — Inpatient Hospital Stay: Payer: 59 | Attending: Oncology

## 2018-11-20 VITALS — BP 121/75 | HR 60 | Temp 98.1°F | Resp 18

## 2018-11-20 DIAGNOSIS — D696 Thrombocytopenia, unspecified: Secondary | ICD-10-CM | POA: Diagnosis not present

## 2018-11-20 DIAGNOSIS — Z5111 Encounter for antineoplastic chemotherapy: Secondary | ICD-10-CM | POA: Diagnosis present

## 2018-11-20 DIAGNOSIS — C099 Malignant neoplasm of tonsil, unspecified: Secondary | ICD-10-CM

## 2018-11-20 DIAGNOSIS — Z79899 Other long term (current) drug therapy: Secondary | ICD-10-CM | POA: Insufficient documentation

## 2018-11-20 DIAGNOSIS — I10 Essential (primary) hypertension: Secondary | ICD-10-CM | POA: Diagnosis not present

## 2018-11-20 DIAGNOSIS — Z87891 Personal history of nicotine dependence: Secondary | ICD-10-CM | POA: Diagnosis not present

## 2018-11-20 DIAGNOSIS — Z7982 Long term (current) use of aspirin: Secondary | ICD-10-CM | POA: Insufficient documentation

## 2018-11-20 DIAGNOSIS — C770 Secondary and unspecified malignant neoplasm of lymph nodes of head, face and neck: Secondary | ICD-10-CM | POA: Insufficient documentation

## 2018-11-20 DIAGNOSIS — Z951 Presence of aortocoronary bypass graft: Secondary | ICD-10-CM | POA: Diagnosis not present

## 2018-11-20 DIAGNOSIS — D649 Anemia, unspecified: Secondary | ICD-10-CM | POA: Insufficient documentation

## 2018-11-20 DIAGNOSIS — Z8249 Family history of ischemic heart disease and other diseases of the circulatory system: Secondary | ICD-10-CM | POA: Insufficient documentation

## 2018-11-20 DIAGNOSIS — Z510 Encounter for antineoplastic radiation therapy: Secondary | ICD-10-CM | POA: Diagnosis not present

## 2018-11-20 DIAGNOSIS — R7989 Other specified abnormal findings of blood chemistry: Secondary | ICD-10-CM | POA: Diagnosis not present

## 2018-11-20 DIAGNOSIS — R55 Syncope and collapse: Secondary | ICD-10-CM

## 2018-11-20 MED ORDER — SODIUM CHLORIDE 0.9 % IV SOLN
Freq: Once | INTRAVENOUS | Status: AC
  Administered 2018-11-20: 11:00:00 via INTRAVENOUS
  Filled 2018-11-20: qty 250

## 2018-11-20 NOTE — Progress Notes (Signed)
Pt reports resolution of previous symptoms. VSS on d/c.

## 2018-11-21 ENCOUNTER — Ambulatory Visit
Admission: RE | Admit: 2018-11-21 | Discharge: 2018-11-21 | Disposition: A | Discharge status: Home / Self Care | Payer: 59 | Source: Ambulatory Visit | Attending: Radiation Oncology | Admitting: Radiation Oncology

## 2018-11-21 ENCOUNTER — Other Ambulatory Visit: Payer: Self-pay

## 2018-11-21 DIAGNOSIS — C099 Malignant neoplasm of tonsil, unspecified: Secondary | ICD-10-CM | POA: Diagnosis not present

## 2018-11-22 ENCOUNTER — Encounter: Payer: Self-pay | Admitting: Oncology

## 2018-11-22 ENCOUNTER — Inpatient Hospital Stay: Payer: 59

## 2018-11-22 ENCOUNTER — Inpatient Hospital Stay (HOSPITAL_BASED_OUTPATIENT_CLINIC_OR_DEPARTMENT_OTHER): Payer: 59 | Admitting: Oncology

## 2018-11-22 ENCOUNTER — Other Ambulatory Visit: Payer: Self-pay

## 2018-11-22 ENCOUNTER — Ambulatory Visit
Admission: RE | Admit: 2018-11-22 | Discharge: 2018-11-22 | Disposition: A | Discharge status: Home / Self Care | Payer: 59 | Source: Ambulatory Visit | Attending: Radiation Oncology | Admitting: Radiation Oncology

## 2018-11-22 VITALS — HR 56

## 2018-11-22 VITALS — BP 133/82 | Temp 97.1°F | Wt 209.2 lb

## 2018-11-22 DIAGNOSIS — C099 Malignant neoplasm of tonsil, unspecified: Secondary | ICD-10-CM

## 2018-11-22 DIAGNOSIS — Z5111 Encounter for antineoplastic chemotherapy: Secondary | ICD-10-CM | POA: Diagnosis not present

## 2018-11-22 LAB — CBC WITH DIFFERENTIAL/PLATELET
Abs Immature Granulocytes: 0 10*3/uL (ref 0.00–0.07)
Basophils Absolute: 0 10*3/uL (ref 0.0–0.1)
Basophils Relative: 0 %
Eosinophils Absolute: 0.1 10*3/uL (ref 0.0–0.5)
Eosinophils Relative: 2 %
HCT: 28.7 % — ABNORMAL LOW (ref 39.0–52.0)
Hemoglobin: 10 g/dL — ABNORMAL LOW (ref 13.0–17.0)
Immature Granulocytes: 0 %
Lymphocytes Relative: 13 %
Lymphs Abs: 0.5 10*3/uL — ABNORMAL LOW (ref 0.7–4.0)
MCH: 32.7 pg (ref 26.0–34.0)
MCHC: 34.8 g/dL (ref 30.0–36.0)
MCV: 93.8 fL (ref 80.0–100.0)
Monocytes Absolute: 0.3 10*3/uL (ref 0.1–1.0)
Monocytes Relative: 9 %
Neutro Abs: 2.6 10*3/uL (ref 1.7–7.7)
Neutrophils Relative %: 76 %
Platelets: 120 10*3/uL — ABNORMAL LOW (ref 150–400)
RBC: 3.06 MIL/uL — ABNORMAL LOW (ref 4.22–5.81)
RDW: 12.7 % (ref 11.5–15.5)
WBC: 3.4 10*3/uL — ABNORMAL LOW (ref 4.0–10.5)
nRBC: 0 % (ref 0.0–0.2)

## 2018-11-22 LAB — BASIC METABOLIC PANEL
Anion gap: 10 (ref 5–15)
BUN: 24 mg/dL — ABNORMAL HIGH (ref 8–23)
CO2: 25 mmol/L (ref 22–32)
Calcium: 8.8 mg/dL — ABNORMAL LOW (ref 8.9–10.3)
Chloride: 102 mmol/L (ref 98–111)
Creatinine, Ser: 1.96 mg/dL — ABNORMAL HIGH (ref 0.61–1.24)
GFR calc Af Amer: 41 mL/min — ABNORMAL LOW (ref >60)
GFR calc non Af Amer: 36 mL/min — ABNORMAL LOW (ref >60)
Glucose, Bld: 117 mg/dL — ABNORMAL HIGH (ref 70–99)
Potassium: 3.8 mmol/L (ref 3.5–5.1)
Sodium: 137 mmol/L (ref 135–145)

## 2018-11-22 MED ORDER — SODIUM CHLORIDE 0.9 % IV SOLN
Freq: Once | INTRAVENOUS | Status: AC
  Administered 2018-11-22: 12:00:00 via INTRAVENOUS
  Filled 2018-11-22: qty 5

## 2018-11-22 MED ORDER — SODIUM CHLORIDE 0.9 % IV SOLN
30.0000 mg/m2 | Freq: Once | INTRAVENOUS | Status: AC
  Administered 2018-11-22: 66 mg via INTRAVENOUS
  Filled 2018-11-22: qty 66

## 2018-11-22 MED ORDER — SODIUM CHLORIDE 0.9 % IV SOLN
Freq: Once | INTRAVENOUS | Status: AC
  Administered 2018-11-22: 10:00:00 via INTRAVENOUS
  Filled 2018-11-22: qty 250

## 2018-11-22 MED ORDER — PALONOSETRON HCL INJECTION 0.25 MG/5ML
0.2500 mg | Freq: Once | INTRAVENOUS | Status: AC
  Administered 2018-11-22: 0.25 mg via INTRAVENOUS
  Filled 2018-11-22: qty 5

## 2018-11-22 MED ORDER — POTASSIUM CHLORIDE 2 MEQ/ML IV SOLN
Freq: Once | INTRAVENOUS | Status: AC
  Administered 2018-11-22: 10:00:00 via INTRAVENOUS
  Filled 2018-11-22: qty 1000

## 2018-11-22 NOTE — Progress Notes (Signed)
Pt in for follow up denies any concerns today.  

## 2018-11-23 ENCOUNTER — Other Ambulatory Visit: Payer: Self-pay

## 2018-11-23 ENCOUNTER — Ambulatory Visit
Admission: RE | Admit: 2018-11-23 | Discharge: 2018-11-23 | Disposition: A | Discharge status: Home / Self Care | Payer: 59 | Source: Ambulatory Visit | Attending: Radiation Oncology | Admitting: Radiation Oncology

## 2018-11-23 DIAGNOSIS — C099 Malignant neoplasm of tonsil, unspecified: Secondary | ICD-10-CM | POA: Diagnosis not present

## 2018-11-27 ENCOUNTER — Encounter: Payer: Self-pay | Admitting: Nurse Practitioner

## 2018-12-22 NOTE — Progress Notes (Signed)
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   INTERVAL HISTORY: Patient returns to clinic today for repeat laboratory work and further evaluation.  He continues to feel well and remains asymptomatic. He denies any dysphasia.  He has no neurologic complaints.  He denies any recent fevers or illnesses.  He has a good appetite and denies weight loss.  He has no chest pain, shortness of breath, cough, or hemoptysis.  He denies any nausea, vomiting, constipation, or diarrhea.  He has no urinary complaints.  Patient feels at his baseline offers no specific complaints today.  REVIEW OF SYSTEMS:   Review of Systems  Constitutional: Negative.  Negative for fever, malaise/fatigue and weight loss.  Respiratory: Negative.  Negative for cough, hemoptysis and shortness of breath.   Cardiovascular: Negative.  Negative for chest pain and leg swelling.  Gastrointestinal: Negative.  Negative for abdominal pain.  Genitourinary: Negative.  Negative for dysuria.  Musculoskeletal: Negative.  Negative for neck pain.  Skin: Negative.  Negative for rash.  Neurological: Negative.  Negative for dizziness, focal weakness, weakness and headaches.  Psychiatric/Behavioral: Negative.  The patient is not nervous/anxious.     As per HPI. Otherwise, a complete review of systems is negative.  PAST MEDICAL HISTORY: Past Medical History:  Diagnosis Date  . Coronary artery disease   . Hypertension     PAST SURGICAL HISTORY: Past Surgical History:  Procedure Laterality Date  . CORONARY ARTERY BYPASS GRAFT  2010   Quadruple    FAMILY HISTORY: Family History  Problem Relation Age of Onset  . Arthritis Mother   . Heart attack Father   . Brain cancer Sister     ADVANCED DIRECTIVES (Y/N):  N  HEALTH MAINTENANCE: Social History   Tobacco Use  . Smoking status: Former Smoker    Types: Cigarettes    Quit date: 10/17/2008    Years since quitting: 10.2  . Smokeless tobacco: Never Used  Substance Use Topics  . Alcohol use: Yes    Comment: occasionally  . Drug use: Never     Colonoscopy:  PAP:  Bone density:  Lipid panel:  No Known Allergies  Current Outpatient Medications  Medication Sig Dispense Refill  . ALPRAZolam (XANAX) 0.5 MG tablet Take 1 tablet (0.5 mg total) by mouth daily as needed for anxiety. Take 30 minutes prior to radiation 30 tablet 0  . amLODipine (NORVASC) 10 MG tablet     . aspirin 325 MG tablet Take 325 mg by mouth daily.    Marland Kitchen atorvastatin (LIPITOR) 80 MG tablet Take 1 tablet by mouth 1 day or 1 dose.    . DENTA 5000 PLUS 1.1 % CREA dental cream BRUSH WITH PASTE 2 3 TIMES PER DAY FOR AT LEAST 1 MINUTE    . lovastatin (MEVACOR) 40 MG tablet Take 40 mg by mouth at bedtime.    . metoprolol succinate (TOPROL-XL) 25 MG 24 hr tablet Take 1 tablet by mouth 1 day or 1 dose.    . olmesartan-hydrochlorothiazide (BENICAR HCT) 40-12.5 MG tablet Take 1 tablet by mouth 1 day or 1 dose.    . ondansetron (ZOFRAN) 8 MG tablet Take 1 tablet (8 mg total) by mouth 2 (two) times daily as needed. 30 tablet 1  . prochlorperazine (COMPAZINE) 10 MG tablet  Take 1 tablet (10 mg total) by mouth every 6 (six) hours as needed (Nausea or vomiting). 30 tablet 1  . ascorbic acid (VITAMIN C) 1000 MG tablet Take 1 tablet by mouth.     No current facility-administered medications for this visit.     OBJECTIVE: Vitals:   12/27/18 1153  BP: 127/64  Pulse: 86  Resp: 16  Temp: 98.6 F (37 C)     Body mass index is 29.74 kg/m.    ECOG FS:0 - Asymptomatic  General: Well-developed, well-nourished, no acute distress. Eyes: Pink conjunctiva, anicteric sclera. HEENT: Normocephalic, moist mucous membranes, clear oropharnyx.  No palpable lymphadenopathy.  Lungs: Clear to auscultation bilaterally. Heart: Regular rate and rhythm. No rubs, murmurs, or gallops. Abdomen: Soft, nontender, nondistended. No organomegaly noted, normoactive bowel sounds. Musculoskeletal: No edema, cyanosis, or clubbing. Neuro: Alert, answering all questions appropriately. Cranial nerves grossly intact. Skin: No rashes or petechiae noted. Psych: Normal affect.  LAB RESULTS:  Lab Results  Component Value Date   NA 139 12/27/2018   K 3.7 12/27/2018   CL 102 12/27/2018   CO2 25 12/27/2018   GLUCOSE 110 (H) 12/27/2018   BUN 30 (H) 12/27/2018   CREATININE 2.11 (H) 12/27/2018   CALCIUM 9.2 12/27/2018   GFRNONAA 33 (L) 12/27/2018   GFRAA 38 (L) 12/27/2018    Lab Results  Component Value Date   WBC 4.0 12/27/2018   NEUTROABS 2.8 12/27/2018   HGB 10.5 (L) 12/27/2018   HCT 30.9 (L) 12/27/2018   MCV 97.8 12/27/2018   PLT 189 12/27/2018     STUDIES: No results found.  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   PLAN:    1. 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  Repeat biopsy confirmed P16+.  Patient received his last dose of weekly cisplatin on November 22, 2018 and completed XRT the following day.  No further interventions are needed.  Will repeat PET scan in 1 month and patient will follow-up 1 to 2 days later for discussion of the results.   2.  Elevated creatinine: Chronic and unchanged.  Repeat laboratory work in 1 month if remains elevated, consider referral to nephrology. 3.  Anemia: Hemoglobin has trended up and is now 10.5.  Monitor. 4.  Thrombocytopenia: Resolved.  Patient expressed understanding and was in agreement with this plan. He also understands that He can call clinic at any time with any questions, concerns, or complaints.   Cancer Staging  Squamous cell carcinoma of left tonsil ([place]) Staging form: Pharynx - HPV-Mediated Oropharynx, AJCC 8th Edition - Clinical: No stage assigned - Unsigned   Lloyd Huger, MD   12/27/2018 4:29 PM

## 2018-12-26 ENCOUNTER — Other Ambulatory Visit: Payer: Self-pay

## 2018-12-26 NOTE — Progress Notes (Signed)
Pre-screening completed

## 2018-12-27 ENCOUNTER — Encounter: Payer: Self-pay | Admitting: Radiation Oncology

## 2018-12-27 ENCOUNTER — Ambulatory Visit
Admission: RE | Admit: 2018-12-27 | Discharge: 2018-12-27 | Disposition: A | Discharge status: Home / Self Care | Payer: 59 | Source: Ambulatory Visit | Attending: Radiation Oncology | Admitting: Radiation Oncology

## 2018-12-27 ENCOUNTER — Other Ambulatory Visit: Payer: Self-pay

## 2018-12-27 ENCOUNTER — Inpatient Hospital Stay (HOSPITAL_BASED_OUTPATIENT_CLINIC_OR_DEPARTMENT_OTHER): Payer: 59 | Admitting: Oncology

## 2018-12-27 ENCOUNTER — Inpatient Hospital Stay: Payer: 59 | Attending: Oncology

## 2018-12-27 VITALS — BP 127/64 | HR 86 | Temp 98.6°F | Resp 16 | Wt 201.4 lb

## 2018-12-27 DIAGNOSIS — Z79899 Other long term (current) drug therapy: Secondary | ICD-10-CM | POA: Diagnosis not present

## 2018-12-27 DIAGNOSIS — D649 Anemia, unspecified: Secondary | ICD-10-CM | POA: Insufficient documentation

## 2018-12-27 DIAGNOSIS — I10 Essential (primary) hypertension: Secondary | ICD-10-CM | POA: Diagnosis not present

## 2018-12-27 DIAGNOSIS — Z87891 Personal history of nicotine dependence: Secondary | ICD-10-CM | POA: Diagnosis not present

## 2018-12-27 DIAGNOSIS — Z951 Presence of aortocoronary bypass graft: Secondary | ICD-10-CM | POA: Insufficient documentation

## 2018-12-27 DIAGNOSIS — Z7982 Long term (current) use of aspirin: Secondary | ICD-10-CM | POA: Diagnosis not present

## 2018-12-27 DIAGNOSIS — C099 Malignant neoplasm of tonsil, unspecified: Secondary | ICD-10-CM

## 2018-12-27 DIAGNOSIS — Z923 Personal history of irradiation: Secondary | ICD-10-CM | POA: Insufficient documentation

## 2018-12-27 DIAGNOSIS — Z8249 Family history of ischemic heart disease and other diseases of the circulatory system: Secondary | ICD-10-CM | POA: Insufficient documentation

## 2018-12-27 DIAGNOSIS — R7989 Other specified abnormal findings of blood chemistry: Secondary | ICD-10-CM | POA: Diagnosis not present

## 2018-12-27 DIAGNOSIS — C760 Malignant neoplasm of head, face and neck: Secondary | ICD-10-CM

## 2018-12-27 LAB — CBC WITH DIFFERENTIAL/PLATELET
Abs Immature Granulocytes: 0 10*3/uL (ref 0.00–0.07)
Basophils Absolute: 0 10*3/uL (ref 0.0–0.1)
Basophils Relative: 0 %
Eosinophils Absolute: 0.1 10*3/uL (ref 0.0–0.5)
Eosinophils Relative: 2 %
HCT: 30.9 % — ABNORMAL LOW (ref 39.0–52.0)
Hemoglobin: 10.5 g/dL — ABNORMAL LOW (ref 13.0–17.0)
Immature Granulocytes: 0 %
Lymphocytes Relative: 17 %
Lymphs Abs: 0.7 10*3/uL (ref 0.7–4.0)
MCH: 33.2 pg (ref 26.0–34.0)
MCHC: 34 g/dL (ref 30.0–36.0)
MCV: 97.8 fL (ref 80.0–100.0)
Monocytes Absolute: 0.5 10*3/uL (ref 0.1–1.0)
Monocytes Relative: 13 %
Neutro Abs: 2.8 10*3/uL (ref 1.7–7.7)
Neutrophils Relative %: 68 %
Platelets: 189 10*3/uL (ref 150–400)
RBC: 3.16 MIL/uL — ABNORMAL LOW (ref 4.22–5.81)
RDW: 15.1 % (ref 11.5–15.5)
WBC: 4 10*3/uL (ref 4.0–10.5)
nRBC: 0 % (ref 0.0–0.2)

## 2018-12-27 LAB — BASIC METABOLIC PANEL
Anion gap: 12 (ref 5–15)
BUN: 30 mg/dL — ABNORMAL HIGH (ref 8–23)
CO2: 25 mmol/L (ref 22–32)
Calcium: 9.2 mg/dL (ref 8.9–10.3)
Chloride: 102 mmol/L (ref 98–111)
Creatinine, Ser: 2.11 mg/dL — ABNORMAL HIGH (ref 0.61–1.24)
GFR calc Af Amer: 38 mL/min — ABNORMAL LOW (ref >60)
GFR calc non Af Amer: 33 mL/min — ABNORMAL LOW (ref >60)
Glucose, Bld: 110 mg/dL — ABNORMAL HIGH (ref 70–99)
Potassium: 3.7 mmol/L (ref 3.5–5.1)
Sodium: 139 mmol/L (ref 135–145)

## 2018-12-27 NOTE — Progress Notes (Signed)
Radiation Oncology Follow up Note  Name: Scott Harding   Date:   12/27/2018 MRN:  [id number] DOB: [birth year]    This 63 y.o. 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  REFERRING PROVIDER: Idelle Crouch, MD  HPI: Patient is a 63 year old male now at 1 month having completed concurrent chemoradiation therapy for stage III squamous cell carcinoma the left tonsillar pillar.  Seen today in routine follow-up he is doing well.  He is having no specific dysphasia or head and neck pain.  Taste is at about 40%.  He has been seen by ENT examination showed no evidence of disease except for moderate oral mucositis..  COMPLICATIONS OF TREATMENT: none  FOLLOW UP COMPLIANCE: keeps appointments   PHYSICAL EXAM:  BP (P) 127/64 (BP Location: Left Arm, Patient Position: Sitting)   Pulse (P) 86   Temp (P) 98.6 F (37 C) (Tympanic)   Resp (P) 18   Wt (P) 201 lb 6.4 oz (91.4 kg)   BMI (P) 29.74 kg/m  Oral cavity is clear no oral mucositis is noted.  He still has persistent node present in the left sub-digastric region no other head and neck adenopathy is appreciated.  Well-developed well-nourished patient in NAD. HEENT reveals PERLA, EOMI, discs not visualized.  Oral cavity is clear. No oral mucosal lesions are identified. Neck is clear without evidence of cervical or supraclavicular adenopathy. Lungs are clear to A&P. Cardiac examination is essentially unremarkable with regular rate and rhythm without murmur rub or thrill. Abdomen is benign with no organomegaly or masses noted. Motor sensory and DTR levels are equal and symmetric in the upper and lower extremities. Cranial nerves II through XII are grossly intact. Proprioception is intact. No peripheral adenopathy or edema is identified. No motor or sensory levels are noted. Crude visual fields are within normal  range.  RADIOLOGY RESULTS: PET CT scan has been ordered for October  PLAN: Present time patient is recovering well from radiation therapy and chemotherapy.  Still persistent nodule in the left sub-digastric region.  PET CT scan has been ordered for October and I will see the patient shortly after that for follow-up.  He continues close follow-up care with both medical oncology as well as ENT.  Patient knows to call with any concerns.  I would like to take this opportunity to thank you for allowing me to participate in the care of your patient.Noreene Filbert, MD

## 2019-01-24 ENCOUNTER — Other Ambulatory Visit: Payer: Self-pay

## 2019-01-24 ENCOUNTER — Ambulatory Visit
Admission: RE | Admit: 2019-01-24 | Discharge: 2019-01-24 | Disposition: A | Discharge status: Home / Self Care | Payer: 59 | Source: Ambulatory Visit | Attending: Oncology | Admitting: Oncology

## 2019-01-24 DIAGNOSIS — Z951 Presence of aortocoronary bypass graft: Secondary | ICD-10-CM | POA: Diagnosis not present

## 2019-01-24 DIAGNOSIS — I2510 Atherosclerotic heart disease of native coronary artery without angina pectoris: Secondary | ICD-10-CM | POA: Insufficient documentation

## 2019-01-24 DIAGNOSIS — C099 Malignant neoplasm of tonsil, unspecified: Secondary | ICD-10-CM | POA: Diagnosis not present

## 2019-01-24 LAB — GLUCOSE, CAPILLARY: Glucose-Capillary: 99 mg/dL (ref 70–99)

## 2019-01-24 MED ORDER — FLUDEOXYGLUCOSE F - 18 (FDG) INJECTION
10.4000 | Freq: Once | INTRAVENOUS | Status: AC | PRN
  Administered 2019-01-24: 10.47 via INTRAVENOUS

## 2019-01-29 ENCOUNTER — Encounter: Payer: Self-pay | Admitting: Oncology

## 2019-01-29 ENCOUNTER — Other Ambulatory Visit: Payer: Self-pay

## 2019-01-29 NOTE — Progress Notes (Signed)
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   INTERVAL HISTORY: Patient returns to clinic today for repeat laboratory work, further evaluation, and discussion of his PET scan results.  He continues to feel well and remains asymptomatic.  He denies any dysphagia or neck pain.  He has no neurologic complaints.  He denies any recent fevers or illnesses.  He has a good appetite and denies weight loss.  He has no chest pain, shortness of breath, cough, or hemoptysis.  He denies any nausea, vomiting, constipation, or diarrhea.  He has no urinary complaints.  Patient offers no specific complaints today.  REVIEW OF SYSTEMS:   Review of Systems  Constitutional: Negative.  Negative for fever, malaise/fatigue and weight loss.  Respiratory: Negative.  Negative for cough, hemoptysis and shortness of breath.   Cardiovascular: Negative.  Negative for chest pain and leg swelling.  Gastrointestinal: Negative.  Negative for abdominal pain.  Genitourinary: Negative.  Negative for dysuria.  Musculoskeletal: Negative.  Negative for neck pain.  Skin: Negative.  Negative for rash.  Neurological: Negative.  Negative for dizziness, focal weakness, weakness and headaches.  Psychiatric/Behavioral: Negative.  The patient is not nervous/anxious.     As per HPI. Otherwise, a complete review of systems is negative.  PAST MEDICAL HISTORY: Past Medical History:  Diagnosis Date   Coronary artery disease    Hypertension     PAST SURGICAL HISTORY: Past Surgical History:  Procedure Laterality Date   CORONARY ARTERY BYPASS GRAFT  2010   Quadruple    FAMILY HISTORY: Family History  Problem Relation Age of Onset   Arthritis Mother    Heart attack Father      Brain cancer Sister     ADVANCED DIRECTIVES (Y/N):  N  HEALTH MAINTENANCE: Social History   Tobacco Use   Smoking status: Former Smoker    Types: Cigarettes    Quit date: 10/17/2008    Years since quitting: 10.2   Smokeless tobacco: Never Used  Substance Use Topics   Alcohol use: Yes    Comment: occasionally   Drug use: Never     Colonoscopy:  PAP:  Bone density:  Lipid panel:  No Known Allergies  Current Outpatient Medications  Medication Sig Dispense Refill   ALPRAZolam (XANAX) 0.5 MG tablet Take 1 tablet (0.5 mg total) by mouth daily as needed for anxiety. Take 30 minutes prior to radiation 30 tablet 0   amLODipine (NORVASC) 10 MG tablet      ascorbic acid (VITAMIN C) 1000 MG tablet Take 1 tablet by mouth.     aspirin 325 MG tablet Take 325 mg by mouth daily.     atorvastatin (LIPITOR) 80 MG tablet Take 1 tablet by mouth 1 day or 1 dose.     DENTA 5000 PLUS 1.1 % CREA dental cream BRUSH WITH PASTE 2 3 TIMES PER DAY FOR AT LEAST 1 MINUTE     lovastatin (MEVACOR) 40 MG tablet Take 40 mg by mouth at bedtime.     olmesartan-hydrochlorothiazide (BENICAR HCT) 40-12.5 MG tablet Take 1 tablet by mouth 1 day or 1 dose.     ondansetron (ZOFRAN) 8 MG tablet Take 1 tablet (8 mg total) by mouth 2 (two) times daily as needed. 30 tablet 1   prochlorperazine (COMPAZINE) 10  MG tablet Take 1 tablet (10 mg total) by mouth every 6 (six) hours as needed (Nausea or vomiting). 30 tablet 1   metoprolol succinate (TOPROL-XL) 25 MG 24 hr tablet Take 1 tablet by mouth 1 day or 1 dose.     No current facility-administered medications for this visit.     OBJECTIVE: Vitals:   01/30/19 1049  BP: 126/72  Pulse: 72  Resp: 16  Temp: 98.7 F (37.1 C)     Body mass index is 30.24 kg/m.    ECOG FS:0 - Asymptomatic  General: Well-developed, well-nourished, no acute distress. Eyes: Pink conjunctiva, anicteric sclera. HEENT: Normocephalic, moist mucous membranes, clear  oropharnyx.  No palpable lymphadenopathy. Lungs: Clear to auscultation bilaterally. Heart: Regular rate and rhythm. No rubs, murmurs, or gallops. Abdomen: Soft, nontender, nondistended. No organomegaly noted, normoactive bowel sounds. Musculoskeletal: No edema, cyanosis, or clubbing. Neuro: Alert, answering all questions appropriately. Cranial nerves grossly intact. Skin: No rashes or petechiae noted. Psych: Normal affect.  LAB RESULTS:  Lab Results  Component Value Date   NA 140 01/30/2019   K 3.8 01/30/2019   CL 104 01/30/2019   CO2 27 01/30/2019   GLUCOSE 131 (H) 01/30/2019   BUN 23 01/30/2019   CREATININE 2.05 (H) 01/30/2019   CALCIUM 8.9 01/30/2019   GFRNONAA 34 (L) 01/30/2019   GFRAA 39 (L) 01/30/2019    Lab Results  Component Value Date   WBC 5.0 01/30/2019   NEUTROABS 3.6 01/30/2019   HGB 10.7 (L) 01/30/2019   HCT 32.1 (L) 01/30/2019   MCV 102.2 (H) 01/30/2019   PLT 179 01/30/2019     STUDIES: Nm Pet Image Restag (ps) Skull Base To Thigh  Result Date: 01/24/2019 CLINICAL DATA:  Subsequent treatment strategy for head and neck cancer. EXAM: NUCLEAR MEDICINE PET SKULL BASE TO THIGH TECHNIQUE: 10.5 mCi F-18 FDG was injected intravenously. Full-ring PET imaging was performed from the skull base to thigh after the radiotracer. CT data was obtained and used for attenuation correction and anatomic localization. Fasting blood glucose: 99 mg/dl COMPARISON:  PET-CT dated 09/18/2018 FINDINGS: Mediastinal blood pool activity: SUV max 3.1 Liver activity: SUV max NA NECK: The left palatine tonsillar region hypermetabolic activity is no longer present. Currently the left palat[place] tonsillar region has maximum SUV of 4.2 (previously 9.0; today the right palatine tonsil region has a maximum SUV of 4.3. There was previously a left level IIa adenopathy at about the level of the hyoid bone which was hypermetabolic with maximum SUV of 9.1, this has resolved on the current exam. The previously  centrally necrotic left level IIa lymph node only have low-grade activity with maximum SUV of about 5.0 on the prior exam, currently maximum SUV of this lesion is 3.2. This lesion currently measures 1.8 cm in short axis on image 46/3, previously 2.6 cm in short axis. No new adenopathy is identified in the neck. Incidental CT findings: Mucous retention cyst of the right maxillary sinus. Left mastoid effusion. Bilateral common carotid atherosclerotic calcification. CHEST: No significant abnormal hypermetabolic activity in this region. Incidental CT findings: Coronary, aortic arch, and branch vessel atherosclerotic vascular disease. Prior CABG. Mild stable cardiomegaly. Mild scarring anteriorly in the right upper lobe on image 89/3. ABDOMEN/PELVIS: Accentuated activity at the anus, maximum SUV 10.9, previously 8.7. I do not see a discrete abnormality in this vicinity on the CT data. Incidental CT findings: Contracted gallbladder. Aortoiliac atherosclerotic vascular disease. SKELETON: No significant abnormal hypermetabolic activity in this region. Incidental CT findings: The T1 ribs  are hypoplastic. IMPRESSION: 1. Resolution of the prior hypermetabolic activity in the left palatine tonsil region. 2. Resolution of the prior hypermetabolic left internal jugular adenopathy at the level of the hyoid bone. 3. Previously centrally necrotic left level IIa lymph node is reduced in size and activity, currently 1.8 cm in short axis with maximum SUV 3.2, previously 2.6 cm in short axis with maximum SUV 5.0. 4. Accentuated activity in the anus without appreciable associated CT abnormality. Although most likely physiologic, 5. Other imaging findings of potential clinical significance: Aortic Atherosclerosis (ICD10-I70.0). Mucous retention cyst in the right maxillary sinus. Left mastoid effusion. Coronary atherosclerosis with prior CABG. Mild stable cardiomegaly. Electronically Signed   By: Van Clines M.D.   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   PLAN:    1. 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  PET scan results from January 24, 2019 reviewed independently and reported as above with no obvious evidence of residual or progressive disease.  He was noted to have a level 2A lymph node that has reduced in size, but remain mildly hypermetabolic.  We will continue to watch closely and repeat imaging with CT scan in 6 months.  No further intervention is needed.  Continue follow-up with ENT as scheduled.  Return to clinic in 3 months for routine evaluation.     2.  Elevated creatinine: Chronic and unchanged.  Patient's baseline creatinine remains approximately 2.0.  Consider referral to nephrology in the future. 3.  Anemia: Hemoglobin improved to 10.7.  Monitor.  I spent a total of 30 minutes face-to-face with the patient of which greater than 50% of the visit was spent in counseling and coordination of care as detailed above.   Patient expressed understanding and was in agreement with this plan. He also understands that He can call clinic at any time with any questions, concerns, or complaints.   Cancer Staging Squamous cell carcinoma of left tonsil ([place]) Staging form: Pharynx - HPV-Mediated Oropharynx, AJCC 8th Edition - Clinical: No stage assigned - Unsigned   Lloyd Huger, MD   01/31/2019 7:06 AM

## 2019-01-29 NOTE — Progress Notes (Signed)
Called patient to prechart for office visit.  Patient has no new concerns

## 2019-01-30 ENCOUNTER — Other Ambulatory Visit: Payer: Self-pay

## 2019-01-30 ENCOUNTER — Inpatient Hospital Stay (HOSPITAL_BASED_OUTPATIENT_CLINIC_OR_DEPARTMENT_OTHER): Payer: 59 | Admitting: Oncology

## 2019-01-30 ENCOUNTER — Inpatient Hospital Stay: Payer: 59 | Attending: Oncology

## 2019-01-30 ENCOUNTER — Encounter: Payer: Self-pay | Admitting: Radiation Oncology

## 2019-01-30 ENCOUNTER — Encounter: Payer: Self-pay | Admitting: Oncology

## 2019-01-30 ENCOUNTER — Ambulatory Visit
Admission: RE | Admit: 2019-01-30 | Discharge: 2019-01-30 | Disposition: A | Discharge status: Home / Self Care | Payer: 59 | Source: Ambulatory Visit | Attending: Radiation Oncology | Admitting: Radiation Oncology

## 2019-01-30 VITALS — BP 126/72 | HR 72 | Temp 98.7°F | Resp 16 | Wt 204.5 lb

## 2019-01-30 VITALS — BP 126/72 | HR 72 | Temp 98.7°F | Resp 16 | Wt 204.8 lb

## 2019-01-30 DIAGNOSIS — Z79899 Other long term (current) drug therapy: Secondary | ICD-10-CM | POA: Diagnosis not present

## 2019-01-30 DIAGNOSIS — C760 Malignant neoplasm of head, face and neck: Secondary | ICD-10-CM

## 2019-01-30 DIAGNOSIS — C099 Malignant neoplasm of tonsil, unspecified: Secondary | ICD-10-CM | POA: Diagnosis present

## 2019-01-30 DIAGNOSIS — R7989 Other specified abnormal findings of blood chemistry: Secondary | ICD-10-CM | POA: Insufficient documentation

## 2019-01-30 DIAGNOSIS — D649 Anemia, unspecified: Secondary | ICD-10-CM | POA: Insufficient documentation

## 2019-01-30 DIAGNOSIS — F17210 Nicotine dependence, cigarettes, uncomplicated: Secondary | ICD-10-CM | POA: Insufficient documentation

## 2019-01-30 DIAGNOSIS — Z923 Personal history of irradiation: Secondary | ICD-10-CM | POA: Insufficient documentation

## 2019-01-30 DIAGNOSIS — Z7982 Long term (current) use of aspirin: Secondary | ICD-10-CM | POA: Insufficient documentation

## 2019-01-30 DIAGNOSIS — E785 Hyperlipidemia, unspecified: Secondary | ICD-10-CM | POA: Diagnosis not present

## 2019-01-30 DIAGNOSIS — C770 Secondary and unspecified malignant neoplasm of lymph nodes of head, face and neck: Secondary | ICD-10-CM | POA: Insufficient documentation

## 2019-01-30 DIAGNOSIS — I10 Essential (primary) hypertension: Secondary | ICD-10-CM | POA: Diagnosis not present

## 2019-01-30 DIAGNOSIS — Z951 Presence of aortocoronary bypass graft: Secondary | ICD-10-CM | POA: Diagnosis not present

## 2019-01-30 LAB — BASIC METABOLIC PANEL
Anion gap: 9 (ref 5–15)
BUN: 23 mg/dL (ref 8–23)
CO2: 27 mmol/L (ref 22–32)
Calcium: 8.9 mg/dL (ref 8.9–10.3)
Chloride: 104 mmol/L (ref 98–111)
Creatinine, Ser: 2.05 mg/dL — ABNORMAL HIGH (ref 0.61–1.24)
GFR calc Af Amer: 39 mL/min — ABNORMAL LOW (ref >60)
GFR calc non Af Amer: 34 mL/min — ABNORMAL LOW (ref >60)
Glucose, Bld: 131 mg/dL — ABNORMAL HIGH (ref 70–99)
Potassium: 3.8 mmol/L (ref 3.5–5.1)
Sodium: 140 mmol/L (ref 135–145)

## 2019-01-30 LAB — CBC WITH DIFFERENTIAL/PLATELET
Abs Immature Granulocytes: 0.01 10*3/uL (ref 0.00–0.07)
Basophils Absolute: 0 10*3/uL (ref 0.0–0.1)
Basophils Relative: 0 %
Eosinophils Absolute: 0.2 10*3/uL (ref 0.0–0.5)
Eosinophils Relative: 3 %
HCT: 32.1 % — ABNORMAL LOW (ref 39.0–52.0)
Hemoglobin: 10.7 g/dL — ABNORMAL LOW (ref 13.0–17.0)
Immature Granulocytes: 0 %
Lymphocytes Relative: 14 %
Lymphs Abs: 0.7 10*3/uL (ref 0.7–4.0)
MCH: 34.1 pg — ABNORMAL HIGH (ref 26.0–34.0)
MCHC: 33.3 g/dL (ref 30.0–36.0)
MCV: 102.2 fL — ABNORMAL HIGH (ref 80.0–100.0)
Monocytes Absolute: 0.6 10*3/uL (ref 0.1–1.0)
Monocytes Relative: 11 %
Neutro Abs: 3.6 10*3/uL (ref 1.7–7.7)
Neutrophils Relative %: 72 %
Platelets: 179 10*3/uL (ref 150–400)
RBC: 3.14 MIL/uL — ABNORMAL LOW (ref 4.22–5.81)
RDW: 13.6 % (ref 11.5–15.5)
WBC: 5 10*3/uL (ref 4.0–10.5)
nRBC: 0 % (ref 0.0–0.2)

## 2019-01-30 NOTE — Progress Notes (Signed)
Radiation Oncology Follow up Note  Name: Scott Harding   Date:   01/30/2019 MRN:  [id number] DOB: July 19, 1955    This 63 y.o. male presents to the clinic today for follow-up now out approximately 2 months status post concurrent chemoradiation therapy for stage III squamous cell carcinoma the left tonsillar pillar.  REFERRING PROVIDER: Idelle Crouch, MD  HPI: Patient is a 63 year old male now approximately 2 months having completed concurrent chemoradiation therapy for stage III (T2 N1 M0) squamous cell carcinoma the left tonsillar pillar.  He is having very little side effects occasional slight sore throat no dysphasia.  I was concerned with a persistent node in his left neck and ordered a PET CT scan.  Which shows resolution of hypermetabolic [place] in the left [place] tonsil region.  There is also resolution of hypermetabolic left internal jugular adenopathy.  Previous central left to a lymph node is reduced in size and activity as well as decreased hypermetabolic activity.  All indications are this is suggestive of complete response at this early stage after concurrent chemoradiation.  COMPLICATIONS OF TREATMENT: none  FOLLOW UP COMPLIANCE: keeps appointments   PHYSICAL EXAM:  BP 126/72 (BP Location: Left Arm, Patient Position: Sitting)   Pulse 72   Temp 98.7 F (37.1 C) (Tympanic)   Resp 16   Wt 204 lb 8 oz (92.8 kg)   BMI 30.20 kg/m  Still persistent level 2A lymph node I examined the left neck.  Well-developed well-nourished patient in NAD. HEENT reveals PERLA, EOMI, discs not visualized.  Oral cavity is clear. No oral mucosal lesions are identified. Neck is clear without evidence of cervical or supraclavicular adenopathy. Lungs are clear to A&P. Cardiac examination is essentially unremarkable with regular rate and rhythm without murmur rub or thrill. Abdomen is benign with no organomegaly or masses noted. Motor sensory and DTR levels are equal and symmetric in the upper and lower  extremities. Cranial nerves II through XII are grossly intact. Proprioception is intact. No peripheral adenopathy or edema is identified. No motor or sensory levels are noted. Crude visual fields are within normal range.  RADIOLOGY RESULTS: PET CT scan reviewed compatible with above-stated findings  PLAN: Even though this is early for PET/CT evaluation I am pleased with the overall progress and response to treatment.  I believe at this time we can continue to observe the patient and I have asked to see him back in 4 to 5 months for follow-up.  At that time a off order another CT scan for continued evidence of lack of tumor progression.  Patient comprehends my treatment plan well.  He continues close follow-up care with ENT.  Patient knows to call with any concerns.  I would like to take this opportunity to thank you for allowing me to participate in the care of your patient.Noreene Filbert, MD

## 2019-01-30 NOTE — Progress Notes (Signed)
Pt in for follow up, denies any concerns today. 

## 2019-02-22 ENCOUNTER — Telehealth: Payer: Self-pay

## 2019-02-22 NOTE — Telephone Encounter (Signed)
Telephone call to patient to discuss SCP visit.   Left message for patient to please return my call.   Awaiting return call.

## 2019-02-23 ENCOUNTER — Telehealth: Payer: Self-pay

## 2019-02-23 NOTE — Telephone Encounter (Signed)
Received return message from patient stating that will be fine to mail SCP packet and I will call him 03/06/2019 around 3:00 pm to review information and discuss post cancer care.   Packet mailed

## 2019-03-06 ENCOUNTER — Inpatient Hospital Stay: Payer: 59 | Attending: Oncology | Admitting: Oncology

## 2019-03-06 DIAGNOSIS — E785 Hyperlipidemia, unspecified: Secondary | ICD-10-CM

## 2019-03-06 DIAGNOSIS — Z9221 Personal history of antineoplastic chemotherapy: Secondary | ICD-10-CM

## 2019-03-06 DIAGNOSIS — C099 Malignant neoplasm of tonsil, unspecified: Secondary | ICD-10-CM | POA: Diagnosis not present

## 2019-03-06 DIAGNOSIS — I10 Essential (primary) hypertension: Secondary | ICD-10-CM | POA: Diagnosis not present

## 2019-03-06 DIAGNOSIS — Z923 Personal history of irradiation: Secondary | ICD-10-CM

## 2019-03-06 DIAGNOSIS — R5383 Other fatigue: Secondary | ICD-10-CM | POA: Diagnosis not present

## 2019-03-06 DIAGNOSIS — Z7982 Long term (current) use of aspirin: Secondary | ICD-10-CM

## 2019-03-06 DIAGNOSIS — Z79899 Other long term (current) drug therapy: Secondary | ICD-10-CM

## 2019-03-06 DIAGNOSIS — F419 Anxiety disorder, unspecified: Secondary | ICD-10-CM

## 2019-03-06 NOTE — Progress Notes (Signed)
Survivorship Care Plan visit completed.  Treatment summary reviewed and previously mailed to patient.  ASCO answers booklet reviewed and given to patient.  CARE program and Cancer Transitions discussed with patient along with other resources cancer center offers to patients and caregivers.  Patient verbalized understanding. 

## 2019-03-06 NOTE — Progress Notes (Signed)
CLINIC:  Survivorship   REASON FOR VISIT:  Routine follow-up for history of left tonsil cancer.   BRIEF ONCOLOGIC HISTORY:  Oncology History  Squamous cell carcinoma of left tonsil ([place])  09/15/2018 Initial Diagnosis   Squamous cell carcinoma of left tonsil ([place])   10/04/2018 -  Chemotherapy   The patient had palonosetron (ALOXI) injection 0.25 mg, 0.25 mg, Intravenous,  Once, 6 of 7 cycles Administration: 0.25 mg (10/04/2018), 0.25 mg (10/18/2018), 0.25 mg (10/25/2018), 0.25 mg (11/01/2018), 0.25 mg (11/08/2018), 0.25 mg (11/22/2018) CISplatin (PLATINOL) 88 mg in sodium chloride 0.9 % 250 mL chemo infusion, 40 mg/m2 = 88 mg, Intravenous,  Once, 6 of 7 cycles Dose modification: 20 mg/m2 (original dose 40 mg/m2, Cycle 2, Reason: Change in SCr/CrCl), 30 mg/m2 (original dose 40 mg/m2, Cycle 3, Reason: Change in SCr/CrCl), 30 mg/m2 (original dose 40 mg/m2, Cycle 4, Reason: Change in SCr/CrCl), 30 mg/m2 (original dose 40 mg/m2, Cycle 6, Reason: Change in SCr/CrCl) Administration: 88 mg (10/04/2018), 44 mg (10/18/2018), 66 mg (10/25/2018), 66 mg (11/01/2018), 66 mg (11/08/2018), 66 mg (11/22/2018) fosaprepitant (EMEND) 150 mg, dexamethasone (DECADRON) 12 mg in sodium chloride 0.9 % 145 mL IVPB, , Intravenous,  Once, 6 of 7 cycles Administration:  (10/04/2018),  (10/18/2018),  (10/25/2018),  (11/01/2018),  (11/08/2018),  (11/22/2018)  for chemotherapy treatment.     INTERVAL HISTORY:  Scott Harding presents to the [facility] today for routine follow-up for her history of tonsil cancer.  Overall, he reports feeling quite well.  He was evaluated by ENT Dr. Tami Ribas yesterday and told everything "was looking good".  His appetite is stable and his taste is beginning to come back.  He occasionally admits to a metallic taste in his mouth.  He is working full-time and feels that his energy level is improving although he is still tired most days.  REVIEW OF SYSTEMS:  Review of Systems  Constitutional: Positive for fatigue.  Negative for appetite change, chills and fever.  HENT:  Negative.  Negative for hearing loss, lump/mass, mouth sores and nosebleeds.   Eyes: Negative.  Negative for eye problems.  Respiratory: Negative for cough, hemoptysis and shortness of breath.   Cardiovascular: Negative.  Negative for chest pain and leg swelling.  Gastrointestinal: Negative.  Negative for abdominal pain, blood in stool, constipation, diarrhea, nausea and vomiting.  Endocrine: Negative.  Negative for hot flashes.  Genitourinary: Negative.  Negative for bladder incontinence, difficulty urinating, dysuria, frequency and hematuria.   Musculoskeletal: Negative.  Negative for back pain, flank pain, gait problem and myalgias.  Skin: Negative.  Negative for itching and rash.  Neurological: Negative.  Negative for dizziness, gait problem, headaches, light-headedness and numbness.  Hematological: Negative.  Negative for adenopathy.  Psychiatric/Behavioral: Negative for confusion. The patient is not nervous/anxious.     PAST MEDICAL/SURGICAL HISTORY:  Past Medical History:  Diagnosis Date  . Coronary artery disease   . Hypertension    Past Surgical History:  Procedure Laterality Date  . CORONARY ARTERY BYPASS GRAFT  2010   Quadruple     ALLERGIES:  No Known Allergies   CURRENT MEDICATIONS:  Outpatient Encounter Medications as of 03/06/2019  Medication Sig  . ALPRAZolam (XANAX) 0.5 MG tablet Take 1 tablet (0.5 mg total) by mouth daily as needed for anxiety. Take 30 minutes prior to radiation  . amLODipine (NORVASC) 10 MG tablet   . ascorbic acid (VITAMIN C) 1000 MG tablet Take 1 tablet by mouth.  Marland Kitchen aspirin 325 MG tablet Take 325 mg by mouth  daily.  . atorvastatin (LIPITOR) 80 MG tablet Take 1 tablet by mouth 1 day or 1 dose.  . DENTA 5000 PLUS 1.1 % CREA dental cream BRUSH WITH PASTE 2 3 TIMES PER DAY FOR AT LEAST 1 MINUTE  . lovastatin (MEVACOR) 40 MG tablet Take 40 mg by mouth at bedtime.  . metoprolol succinate  (TOPROL-XL) 25 MG 24 hr tablet Take 1 tablet by mouth 1 day or 1 dose.  . olmesartan-hydrochlorothiazide (BENICAR HCT) 40-12.5 MG tablet Take 1 tablet by mouth 1 day or 1 dose.  . ondansetron (ZOFRAN) 8 MG tablet Take 1 tablet (8 mg total) by mouth 2 (two) times daily as needed.  . prochlorperazine (COMPAZINE) 10 MG tablet Take 1 tablet (10 mg total) by mouth every 6 (six) hours as needed (Nausea or vomiting).   No facility-administered encounter medications on file as of 03/06/2019.    ONCOLOGIC FAMILY HISTORY:  Family History  Problem Relation Age of Onset  . Arthritis Mother   . Heart attack Father   . Brain cancer Sister      SOCIAL HISTORY:  Scott Harding is single and lives alone in [place], [place].   Scott Harding is currently working full time. He denies any current or history of tobacco, alcohol, or illicit drug use.    PHYSICAL EXAMINATION:  Vital Signs: There were no vitals filed for this visit. There were no vitals filed for this visit.  LABORATORY DATA:  Lab Results  Component Value Date   WBC 5.0 01/30/2019   HGB 10.7 (L) 01/30/2019   HCT 32.1 (L) 01/30/2019   MCV 102.2 (H) 01/30/2019   PLT 179 01/30/2019     Chemistry      Component Value Date/Time   NA 140 01/30/2019 1007   K 3.8 01/30/2019 1007   CL 104 01/30/2019 1007   CO2 27 01/30/2019 1007   BUN 23 01/30/2019 1007   CREATININE 2.05 (H) 01/30/2019 1007      Component Value Date/Time   CALCIUM 8.9 01/30/2019 1007     DIAGNOSTIC IMAGING:  Pet 01/24/2019  IMPRESSION: 1. Resolution of the prior hypermetabolic activity in the left palatine tonsil region. 2. Resolution of the prior hypermetabolic left internal jugular adenopathy at the level of the hyoid bone. 3. Previously centrally necrotic left level IIa lymph node is reduced in size and activity, currently 1.8 cm in short axis with maximum SUV 3.2, previously 2.6 cm in short axis with maximum SUV 5.0. 4. Accentuated activity in the anus  without appreciable associated CT abnormality. Although most likely physiologic, 5. Other imaging findings of potential clinical significance: Aortic Atherosclerosis (ICD10-I70.0). Mucous retention cyst in the right maxillary sinus. Left mastoid effusion. Coronary atherosclerosis with prior CABG. Mild stable cardiomegaly.    ASSESSMENT AND PLAN:  Mr.. Harding is a pleasant 63 y.o. male with history of Stage IVa squamous cell carcinoma of left tonsil diagnosed in  May 2020 treated with 6 cycles Cisplatin and radiation. He [facility] the [facility] for surveillance and routine follow-up.   1. History of tonsil cancer:  Mr. Harding is currently clinically and radiographically without evidence of disease or recurrence of left tonsil cancer.  Patient had longstanding history of sinus drainage causing sore throat.  He noted some bilateral enlarging nodes in his neck more extenuated on his left side in May 2020.  He was placed on empiric antibiotics therapy right-sided adenopathy cleared.  He still had a sore throat was referred to ENT where he was noted  to have adenopathy in his neck.  CT scan demonstrated enlarged left level 2 cervical lymph nodes with central necrosis consistent with metastatic disease.  Fine-needle aspiration was performed positive for squamous cell carcinoma not enough tissue for HPV evaluation.  He is scheduled for rebiopsy to obtain that information.  PET scan demonstrated hypermetabolic lymph nodes in the left level 2 cervical chain consistent with metastatic disease.  Focal intense asymmetric hypermetabolic Harding in his left posterior pharynx localizing to the region of the palate tonsil concerning for primary head neck cancer.  No evidence of distant mets were noted  He was evaluated by Dr. Tami Ribas yesterday where he was scoped and told "everything looks great".  I cannot see documentation of this.  He is scheduled for repeat imaging in January 2021 along with lab work and  assessment with Dr. Grayland Harding.  I encouraged him to call with any questions or concerns before his next visit to the cancer center and I would be happy to see him sooner if needed.  #. Problem(s) at Visit: None  #. Cancer screening:  Due to Scott Harding history and his age, he should receive screening for skin cancers and colon cancer. He was encouraged to follow-up with his PCP for appropriate cancer screenings.   #. Health maintenance and wellness promotion: Scott Harding was encouraged to consume 5-7 servings of fruits and vegetables per day. He was also encouraged to engage in moderate to vigorous exercise for 30 minutes per day most days of the week. He was instructed to limit his alcohol consumption and continue to abstain from tobacco use.  Dispo:  -Return to cancer center in January 2021 for repeat imaging, labs and md assessment as scheduled.   A total of (30) minutes of face-to-face time was spent with this patient with greater than 50% of that time in counseling and care-coordination.   Rulon Abide, NP AGNP-[facility] [place] [phone]   Note: PRIMARY CARE PROVIDER Idelle Crouch, [place] [phone]

## 2019-04-26 ENCOUNTER — Ambulatory Visit: Payer: 59 | Admitting: Radiation Oncology

## 2019-05-04 ENCOUNTER — Other Ambulatory Visit: Payer: Self-pay

## 2019-05-04 NOTE — Progress Notes (Signed)
Patient pre screened for office appointment, no questions or concerns today. Patient reminded of upcoming appointment time and date. 

## 2019-05-04 NOTE — Progress Notes (Signed)
[facility]  [phone] Fax:(336) [phone]  ID: Scott Harding OB: October 24, 1955  MR#: [id number]  QPR#:[id number]  Patient Care Team: Idelle Crouch, MD as PCP - General (Internal Medicine) Beverly Gust, MD (Otolaryngology) Lloyd Huger, MD as Consulting Physician (Oncology) Noreene Filbert, MD as Referring Physician (Radiation Oncology)  CHIEF COMPLAINT: Stage IVa squamous cell carcinoma of the left tonsil, P16+.   INTERVAL HISTORY: Patient returns to clinic today for repeat laboratory work and further evaluation.  He continues to feel well and remains asymptomatic.  He has a good appetite and denies weight loss.  He denies any dysphagia or neck pain. He has no neurologic complaints.  He denies any recent fevers or illnesses. He has no chest pain, shortness of breath, cough, or hemoptysis.  He denies any nausea, vomiting, constipation, or diarrhea.  He has no urinary complaints.  Patient feels at his baseline offers no specific complaints today.  REVIEW OF SYSTEMS:   Review of Systems  Constitutional: Negative.  Negative for fever, malaise/fatigue and weight loss.  Respiratory: Negative.  Negative for cough, hemoptysis and shortness of breath.   Cardiovascular: Negative.  Negative for chest pain and leg swelling.  Gastrointestinal: Negative.  Negative for abdominal pain.  Genitourinary: Negative.  Negative for dysuria.  Musculoskeletal: Negative.  Negative for neck pain.  Skin: Negative.  Negative for rash.  Neurological: Negative.  Negative for dizziness, focal weakness, weakness and headaches.  Psychiatric/Behavioral: Negative.  The patient is not nervous/anxious.     As per HPI. Otherwise, a complete review of systems is negative.  PAST MEDICAL HISTORY: Past Medical History:  Diagnosis Date  . Coronary artery disease   . Hypertension     PAST SURGICAL HISTORY: Past Surgical History:  Procedure Laterality Date  . CORONARY ARTERY  BYPASS GRAFT  2010   Quadruple    FAMILY HISTORY: Family History  Problem Relation Age of Onset  . Arthritis Mother   . Heart attack Father   . Brain cancer Sister     ADVANCED DIRECTIVES (Y/N):  N  HEALTH MAINTENANCE: Social History   Tobacco Use  . Smoking status: Former Smoker    Types: Cigarettes    Quit date: 10/17/2008    Years since quitting: 10.5  . Smokeless tobacco: Never Used  Substance Use Topics  . Alcohol use: Yes    Comment: occasionally  . Drug use: Never     Colonoscopy:  PAP:  Bone density:  Lipid panel:  No Known Allergies  Current Outpatient Medications  Medication Sig Dispense Refill  . ALPRAZolam (XANAX) 0.5 MG tablet Take 1 tablet (0.5 mg total) by mouth daily as needed for anxiety. Take 30 minutes prior to radiation 30 tablet 0  . amLODipine (NORVASC) 10 MG tablet     . ascorbic acid (VITAMIN C) 1000 MG tablet Take 1 tablet by mouth.    Marland Kitchen aspirin 325 MG tablet Take 325 mg by mouth daily.    Marland Kitchen atorvastatin (LIPITOR) 80 MG tablet Take 1 tablet by mouth 1 day or 1 dose.    . DENTA 5000 PLUS 1.1 % CREA dental cream BRUSH WITH PASTE 2 3 TIMES PER DAY FOR AT LEAST 1 MINUTE    . lovastatin (MEVACOR) 40 MG tablet Take 40 mg by mouth at bedtime.    Marland Kitchen olmesartan-hydrochlorothiazide (BENICAR HCT) 40-12.5 MG tablet Take 1 tablet by mouth 1 day or 1 dose.    . ondansetron (ZOFRAN) 8 MG tablet Take 1 tablet (8 mg total) by  mouth 2 (two) times daily as needed. 30 tablet 1  . prochlorperazine (COMPAZINE) 10 MG tablet Take 1 tablet (10 mg total) by mouth every 6 (six) hours as needed (Nausea or vomiting). 30 tablet 1  . metoprolol succinate (TOPROL-XL) 25 MG 24 hr tablet Take 1 tablet by mouth 1 day or 1 dose.     No current facility-administered medications for this visit.    OBJECTIVE: Vitals:   05/07/19 1116  BP: (!) 148/80  Pulse: 92  Resp: 16  Temp: 98.2 F (36.8 C)  SpO2: 99%     Body mass index is 31.16 kg/m.    ECOG FS:0 - Asymptomatic   General: Well-developed, well-nourished, no acute distress. Eyes: Pink conjunctiva, anicteric sclera. HEENT: Normocephalic, moist mucous membranes.  No palpable lymphadenopathy. Lungs: No audible wheezing or coughing. Heart: Regular rate and rhythm. Abdomen: Soft, nontender, no obvious distention. Musculoskeletal: No edema, cyanosis, or clubbing. Neuro: Alert, answering all questions appropriately. Cranial nerves grossly intact. Skin: No rashes or petechiae noted. Psych: Normal affect.   LAB RESULTS:  Lab Results  Component Value Date   NA 137 05/07/2019   K 4.2 05/07/2019   CL 102 05/07/2019   CO2 26 05/07/2019   GLUCOSE 103 (H) 05/07/2019   BUN 30 (H) 05/07/2019   CREATININE 1.85 (H) 05/07/2019   CALCIUM 9.2 05/07/2019   GFRNONAA 38 (L) 05/07/2019   GFRAA 44 (L) 05/07/2019    Lab Results  Component Value Date   WBC 5.4 05/07/2019   NEUTROABS 3.3 05/07/2019   HGB 11.6 (L) 05/07/2019   HCT 35.4 (L) 05/07/2019   MCV 100.0 05/07/2019   PLT 162 05/07/2019     STUDIES: No results found.  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   PLAN:    1. 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  PET scan results from January 24, 2019 reviewed independently with no obvious evidence of residual or progressive disease.  He was noted to have a level 2A lymph node that has reduced in size, but remain mildly hypermetabolic. Continue to watch closely.  No further intervention is needed.  Continue follow-up with ENT as scheduled.  Return to clinic in 3 months with repeat imaging and laboratory work along with further evaluation. 2.  Elevated creatinine: Creatinine slightly improved to 1.85.  Patient's baseline creatinine remains approximately 2.0.  Consider referral to nephrology in the future. 3.  Anemia: Hemoglobin has trended up to 11.6.   Patient expressed understanding and was  in agreement with this plan. He also understands that He can call clinic at any time with any questions, concerns, or complaints.   Cancer Staging Squamous cell carcinoma of left tonsil ([place]) Staging form: Pharynx - HPV-Mediated Oropharynx, AJCC 8th Edition - Clinical: No stage assigned - Unsigned   Lloyd Huger, MD   05/08/2019 6:43 AM

## 2019-05-07 ENCOUNTER — Inpatient Hospital Stay: Payer: 59 | Attending: Oncology

## 2019-05-07 ENCOUNTER — Inpatient Hospital Stay (HOSPITAL_BASED_OUTPATIENT_CLINIC_OR_DEPARTMENT_OTHER): Payer: 59 | Admitting: Oncology

## 2019-05-07 ENCOUNTER — Other Ambulatory Visit: Payer: Self-pay

## 2019-05-07 VITALS — BP 148/80 | HR 92 | Temp 98.2°F | Resp 16 | Wt 211.0 lb

## 2019-05-07 DIAGNOSIS — C099 Malignant neoplasm of tonsil, unspecified: Secondary | ICD-10-CM

## 2019-05-07 DIAGNOSIS — Z85818 Personal history of malignant neoplasm of other sites of lip, oral cavity, and pharynx: Secondary | ICD-10-CM | POA: Diagnosis not present

## 2019-05-07 DIAGNOSIS — R7989 Other specified abnormal findings of blood chemistry: Secondary | ICD-10-CM | POA: Insufficient documentation

## 2019-05-07 DIAGNOSIS — D649 Anemia, unspecified: Secondary | ICD-10-CM | POA: Diagnosis not present

## 2019-05-07 DIAGNOSIS — I2510 Atherosclerotic heart disease of native coronary artery without angina pectoris: Secondary | ICD-10-CM | POA: Insufficient documentation

## 2019-05-07 DIAGNOSIS — I10 Essential (primary) hypertension: Secondary | ICD-10-CM | POA: Insufficient documentation

## 2019-05-07 DIAGNOSIS — Z7982 Long term (current) use of aspirin: Secondary | ICD-10-CM | POA: Insufficient documentation

## 2019-05-07 DIAGNOSIS — Z8249 Family history of ischemic heart disease and other diseases of the circulatory system: Secondary | ICD-10-CM | POA: Insufficient documentation

## 2019-05-07 DIAGNOSIS — Z87891 Personal history of nicotine dependence: Secondary | ICD-10-CM | POA: Diagnosis not present

## 2019-05-07 DIAGNOSIS — Z79899 Other long term (current) drug therapy: Secondary | ICD-10-CM | POA: Diagnosis not present

## 2019-05-07 DIAGNOSIS — Z9221 Personal history of antineoplastic chemotherapy: Secondary | ICD-10-CM | POA: Insufficient documentation

## 2019-05-07 LAB — CBC WITH DIFFERENTIAL/PLATELET
Abs Immature Granulocytes: 0.02 10*3/uL (ref 0.00–0.07)
Basophils Absolute: 0 10*3/uL (ref 0.0–0.1)
Basophils Relative: 0 %
Eosinophils Absolute: 0.2 10*3/uL (ref 0.0–0.5)
Eosinophils Relative: 4 %
HCT: 35.4 % — ABNORMAL LOW (ref 39.0–52.0)
Hemoglobin: 11.6 g/dL — ABNORMAL LOW (ref 13.0–17.0)
Immature Granulocytes: 0 %
Lymphocytes Relative: 20 %
Lymphs Abs: 1.1 10*3/uL (ref 0.7–4.0)
MCH: 32.8 pg (ref 26.0–34.0)
MCHC: 32.8 g/dL (ref 30.0–36.0)
MCV: 100 fL (ref 80.0–100.0)
Monocytes Absolute: 0.8 10*3/uL (ref 0.1–1.0)
Monocytes Relative: 15 %
Neutro Abs: 3.3 10*3/uL (ref 1.7–7.7)
Neutrophils Relative %: 61 %
Platelets: 162 10*3/uL (ref 150–400)
RBC: 3.54 MIL/uL — ABNORMAL LOW (ref 4.22–5.81)
RDW: 13.2 % (ref 11.5–15.5)
WBC: 5.4 10*3/uL (ref 4.0–10.5)
nRBC: 0 % (ref 0.0–0.2)

## 2019-05-07 LAB — BASIC METABOLIC PANEL
Anion gap: 9 (ref 5–15)
BUN: 30 mg/dL — ABNORMAL HIGH (ref 8–23)
CO2: 26 mmol/L (ref 22–32)
Calcium: 9.2 mg/dL (ref 8.9–10.3)
Chloride: 102 mmol/L (ref 98–111)
Creatinine, Ser: 1.85 mg/dL — ABNORMAL HIGH (ref 0.61–1.24)
GFR calc Af Amer: 44 mL/min — ABNORMAL LOW (ref >60)
GFR calc non Af Amer: 38 mL/min — ABNORMAL LOW (ref >60)
Glucose, Bld: 103 mg/dL — ABNORMAL HIGH (ref 70–99)
Potassium: 4.2 mmol/L (ref 3.5–5.1)
Sodium: 137 mmol/L (ref 135–145)

## 2019-05-29 ENCOUNTER — Other Ambulatory Visit: Payer: 59

## 2019-05-29 ENCOUNTER — Ambulatory Visit: Payer: 59 | Admitting: Oncology

## 2019-07-26 ENCOUNTER — Ambulatory Visit: Payer: 59 | Admitting: Radiation Oncology

## 2019-07-27 NOTE — Progress Notes (Signed)
[name]  Telephone:(336) [phone] Fax:(336) [phone]  ID: Scott Harding OB: June 28, 1955  MR#: [id number]  ZSM#:[id number]  Patient Care Team: Idelle Crouch, MD as PCP - General (Internal Medicine) Beverly Gust, MD (Otolaryngology) Lloyd Huger, MD as Consulting Physician (Oncology) Noreene Filbert, MD as Referring Physician (Radiation Oncology)  CHIEF COMPLAINT: Stage IVa squamous cell carcinoma of the left tonsil, P16+.   INTERVAL HISTORY: Patient returns to clinic today for repeat laboratory work, discussion of his imaging results, and further evaluation.  He currently feels well and states he is back to his baseline.  He does not complain of any weakness or fatigue.  He has no dysphagia.  He has a good appetite and denies weight loss.  He has no neurologic complaints.  He denies any recent fevers or illnesses. He has no chest pain, shortness of breath, cough, or hemoptysis.  He denies any nausea, vomiting, constipation, or diarrhea.  He has no urinary complaints.  Patient offers no specific complaints today.  REVIEW OF SYSTEMS:   Review of Systems  Constitutional: Negative.  Negative for fever, malaise/fatigue and weight loss.  Respiratory: Negative.  Negative for cough, hemoptysis and shortness of breath.   Cardiovascular: Negative.  Negative for chest pain and leg swelling.  Gastrointestinal: Negative.  Negative for abdominal pain.  Genitourinary: Negative.  Negative for dysuria.  Musculoskeletal: Negative.  Negative for neck pain.  Skin: Negative.  Negative for rash.  Neurological: Negative.  Negative for dizziness, focal weakness, weakness and headaches.  Psychiatric/Behavioral: Negative.  The patient is not nervous/anxious.     As per HPI. Otherwise, a complete review of systems is negative.  PAST MEDICAL HISTORY: Past Medical History:  Diagnosis Date  . Cancer ([place])   . Coronary artery disease   . Hypertension     PAST SURGICAL  HISTORY: Past Surgical History:  Procedure Laterality Date  . CORONARY ARTERY BYPASS GRAFT  2010   Quadruple    FAMILY HISTORY: Family History  Problem Relation Age of Onset  . Arthritis Mother   . Heart attack Father   . Brain cancer Sister     ADVANCED DIRECTIVES (Y/N):  N  HEALTH MAINTENANCE: Social History   Tobacco Use  . Smoking status: Former Smoker    Types: Cigarettes    Quit date: 10/17/2008    Years since quitting: 10.7  . Smokeless tobacco: Never Used  Substance Use Topics  . Alcohol use: Yes    Comment: occasionally  . Drug use: Never     Colonoscopy:  PAP:  Bone density:  Lipid panel:  No Known Allergies  Current Outpatient Medications  Medication Sig Dispense Refill  . amLODipine (NORVASC) 10 MG tablet     . ascorbic acid (VITAMIN C) 1000 MG tablet Take 1 tablet by mouth.    Marland Kitchen aspirin 325 MG tablet Take 325 mg by mouth daily.    Marland Kitchen atorvastatin (LIPITOR) 80 MG tablet Take 1 tablet by mouth 1 day or 1 dose.    . DENTA 5000 PLUS 1.1 % CREA dental cream BRUSH WITH PASTE 2 3 TIMES PER DAY FOR AT LEAST 1 MINUTE    . lovastatin (MEVACOR) 40 MG tablet Take 40 mg by mouth at bedtime.    . metoprolol succinate (TOPROL-XL) 25 MG 24 hr tablet Take 1 tablet by mouth 1 day or 1 dose.    . olmesartan-hydrochlorothiazide (BENICAR HCT) 40-12.5 MG tablet Take 1 tablet by mouth 1 day or 1 dose.    . ondansetron (  ZOFRAN) 8 MG tablet Take 1 tablet (8 mg total) by mouth 2 (two) times daily as needed. 30 tablet 1  . prochlorperazine (COMPAZINE) 10 MG tablet Take 1 tablet (10 mg total) by mouth every 6 (six) hours as needed (Nausea or vomiting). 30 tablet 1  . rosuvastatin (CRESTOR) 40 MG tablet Take 40 mg by mouth daily.     No current facility-administered medications for this visit.    OBJECTIVE: Vitals:   08/02/19 1104  BP: (!) 140/91  Pulse: 68  Resp: 16  Temp: 97.9 F (36.6 C)     Body mass index is 31.56 kg/m.    ECOG FS:0 - Asymptomatic  General:  Well-developed, well-nourished, no acute distress. Eyes: Pink conjunctiva, anicteric sclera. HEENT: Normocephalic, moist mucous membranes.  Clear oropharynx.  No palpable lymphadenopathy. Lungs: No audible wheezing or coughing. Heart: Regular rate and rhythm. Abdomen: Soft, nontender, no obvious distention. Musculoskeletal: No edema, cyanosis, or clubbing. Neuro: Alert, answering all questions appropriately. Cranial nerves grossly intact. Skin: No rashes or petechiae noted. Psych: Normal affect.   LAB RESULTS:  Lab Results  Component Value Date   NA 136 08/02/2019   K 3.8 08/02/2019   CL 100 08/02/2019   CO2 24 08/02/2019   GLUCOSE 102 (H) 08/02/2019   BUN 23 08/02/2019   CREATININE 1.72 (H) 08/02/2019   CALCIUM 9.1 08/02/2019   GFRNONAA 41 (L) 08/02/2019   GFRAA 48 (L) 08/02/2019    Lab Results  Component Value Date   WBC 5.2 08/02/2019   NEUTROABS 3.2 08/02/2019   HGB 11.4 (L) 08/02/2019   HCT 33.1 (L) 08/02/2019   MCV 99.7 08/02/2019   PLT 169 08/02/2019     STUDIES: CT SOFT TISSUE NECK WO CONTRAST  Result Date: 08/01/2019 CLINICAL DATA:  History of tonsillar cancer on the left. Treatment complete. EXAM: CT NECK WITHOUT CONTRAST TECHNIQUE: Multidetector CT imaging of the neck was performed following the standard protocol without intravenous contrast. COMPARISON:  PET CT 01/24/2019.  CT neck 08/30/2018 FINDINGS: Pharynx and larynx: Normal. No mass or swelling. No residual tonsillar mass. Salivary glands: No inflammation, mass, or stone. Thyroid: Negative Lymph nodes: Left level 2 lymph node 19 mm with central necrosis and slight peripheral calcification. This lymph node is unchanged from the prior PET-CT and significantly improved since Aug 30, 2018 CT. No new adenopathy in the neck. Vascular: Limited vascular evaluation without intravenous contrast. Prior CABG. Limited intracranial: Negative Visualized orbits: Negative Mastoids and visualized paranasal sinuses: Retention  cyst right maxillary sinus. Mild mucosal thickening left maxillary sinus. Mild mucosal edema in the ethmoid sinuses bilaterally. Mastoid sinus clear bilaterally. Skeleton: Cervical spondylosis.  No acute skeletal abnormality. Upper chest: Lung apices clear bilaterally.  Prior CABG. Other: None IMPRESSION: No recurrent mass in the tonsil. 19 mm necrotic level 2 lymph node on the left is stable since the prior CT when it was not hypermetabolic. No new adenopathy. Electronically Signed   By: Franchot Gallo M.D.   On: 08/01/2019 13:16    ASSESSMENT: Stage IVa squamous cell carcinoma of the left tonsil, P16+.   PLAN:    1. 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  PET scan results from January 24, 2019 reviewed independently with no obvious evidence of residual or progressive disease.  He was noted to have a level 2A lymph node that has reduced in size, but remain mildly hypermetabolic.  Repeat CT scan on August 01, 2019 revealed stable lymph node no evidence of recurrent or progressive disease.  No further imaging is necessary unless there is concern of recurrence.  Continue follow-up with ENT as scheduled.  Patient reports he has an appointment with them next week.  Return to clinic in 6 months with repeat laboratory work and further evaluation.   2.  Elevated creatinine: Chronic and unchanged.  Patient's creatinine is at his baseline at 1.72.  Consider referral to nephrology in the future. 3.  Anemia: Chronic and unchanged.  Patient's hemoglobin is 11.4. 4.  Hypertension: Patient's blood pressure is moderately elevated today.  Continue treatment per primary care.   Patient expressed understanding and was in agreement with this plan. He also understands that He can call clinic at any time with any questions, concerns, or complaints.   Cancer Staging Squamous cell carcinoma of left tonsil ([place]) Staging form: Pharynx -  HPV-Mediated Oropharynx, AJCC 8th Edition - Clinical: No stage assigned - Unsigned   Lloyd Huger, MD   08/02/2019 11:39 AM

## 2019-08-01 ENCOUNTER — Other Ambulatory Visit: Payer: Self-pay

## 2019-08-01 ENCOUNTER — Ambulatory Visit
Admission: RE | Admit: 2019-08-01 | Discharge: 2019-08-01 | Disposition: A | Discharge status: Home / Self Care | Payer: 59 | Source: Ambulatory Visit | Attending: Oncology | Admitting: Oncology

## 2019-08-01 DIAGNOSIS — C099 Malignant neoplasm of tonsil, unspecified: Secondary | ICD-10-CM

## 2019-08-01 HISTORY — DX: Malignant (primary) neoplasm, unspecified: C80.1

## 2019-08-01 LAB — POCT I-STAT CREATININE: Creatinine, Ser: 1.9 mg/dL — ABNORMAL HIGH (ref 0.61–1.24)

## 2019-08-02 ENCOUNTER — Inpatient Hospital Stay: Payer: 59 | Attending: Oncology | Admitting: Oncology

## 2019-08-02 ENCOUNTER — Encounter: Payer: Self-pay | Admitting: Oncology

## 2019-08-02 ENCOUNTER — Encounter: Payer: Self-pay | Admitting: Radiation Oncology

## 2019-08-02 ENCOUNTER — Ambulatory Visit
Admission: RE | Admit: 2019-08-02 | Discharge: 2019-08-02 | Disposition: A | Discharge status: Home / Self Care | Payer: 59 | Source: Ambulatory Visit | Attending: Radiation Oncology | Admitting: Radiation Oncology

## 2019-08-02 ENCOUNTER — Other Ambulatory Visit: Payer: Self-pay

## 2019-08-02 ENCOUNTER — Inpatient Hospital Stay: Payer: 59

## 2019-08-02 VITALS — BP 140/91 | HR 68 | Temp 97.9°F | Resp 16 | Wt 213.7 lb

## 2019-08-02 DIAGNOSIS — Z923 Personal history of irradiation: Secondary | ICD-10-CM | POA: Insufficient documentation

## 2019-08-02 DIAGNOSIS — Z85819 Personal history of malignant neoplasm of unspecified site of lip, oral cavity, and pharynx: Secondary | ICD-10-CM | POA: Insufficient documentation

## 2019-08-02 DIAGNOSIS — I2510 Atherosclerotic heart disease of native coronary artery without angina pectoris: Secondary | ICD-10-CM | POA: Insufficient documentation

## 2019-08-02 DIAGNOSIS — Z79899 Other long term (current) drug therapy: Secondary | ICD-10-CM | POA: Diagnosis not present

## 2019-08-02 DIAGNOSIS — Z7982 Long term (current) use of aspirin: Secondary | ICD-10-CM | POA: Insufficient documentation

## 2019-08-02 DIAGNOSIS — R944 Abnormal results of kidney function studies: Secondary | ICD-10-CM | POA: Diagnosis not present

## 2019-08-02 DIAGNOSIS — Z87891 Personal history of nicotine dependence: Secondary | ICD-10-CM | POA: Insufficient documentation

## 2019-08-02 DIAGNOSIS — C099 Malignant neoplasm of tonsil, unspecified: Secondary | ICD-10-CM | POA: Insufficient documentation

## 2019-08-02 DIAGNOSIS — Z9221 Personal history of antineoplastic chemotherapy: Secondary | ICD-10-CM | POA: Insufficient documentation

## 2019-08-02 DIAGNOSIS — I10 Essential (primary) hypertension: Secondary | ICD-10-CM | POA: Insufficient documentation

## 2019-08-02 DIAGNOSIS — D649 Anemia, unspecified: Secondary | ICD-10-CM | POA: Insufficient documentation

## 2019-08-02 DIAGNOSIS — C760 Malignant neoplasm of head, face and neck: Secondary | ICD-10-CM

## 2019-08-02 LAB — BASIC METABOLIC PANEL
Anion gap: 12 (ref 5–15)
BUN: 23 mg/dL (ref 8–23)
CO2: 24 mmol/L (ref 22–32)
Calcium: 9.1 mg/dL (ref 8.9–10.3)
Chloride: 100 mmol/L (ref 98–111)
Creatinine, Ser: 1.72 mg/dL — ABNORMAL HIGH (ref 0.61–1.24)
GFR calc Af Amer: 48 mL/min — ABNORMAL LOW (ref >60)
GFR calc non Af Amer: 41 mL/min — ABNORMAL LOW (ref >60)
Glucose, Bld: 102 mg/dL — ABNORMAL HIGH (ref 70–99)
Potassium: 3.8 mmol/L (ref 3.5–5.1)
Sodium: 136 mmol/L (ref 135–145)

## 2019-08-02 LAB — CBC WITH DIFFERENTIAL/PLATELET
Abs Immature Granulocytes: 0.02 10*3/uL (ref 0.00–0.07)
Basophils Absolute: 0 10*3/uL (ref 0.0–0.1)
Basophils Relative: 0 %
Eosinophils Absolute: 0.3 10*3/uL (ref 0.0–0.5)
Eosinophils Relative: 5 %
HCT: 33.1 % — ABNORMAL LOW (ref 39.0–52.0)
Hemoglobin: 11.4 g/dL — ABNORMAL LOW (ref 13.0–17.0)
Immature Granulocytes: 0 %
Lymphocytes Relative: 18 %
Lymphs Abs: 1 10*3/uL (ref 0.7–4.0)
MCH: 34.3 pg — ABNORMAL HIGH (ref 26.0–34.0)
MCHC: 34.4 g/dL (ref 30.0–36.0)
MCV: 99.7 fL (ref 80.0–100.0)
Monocytes Absolute: 0.8 10*3/uL (ref 0.1–1.0)
Monocytes Relative: 15 %
Neutro Abs: 3.2 10*3/uL (ref 1.7–7.7)
Neutrophils Relative %: 62 %
Platelets: 169 10*3/uL (ref 150–400)
RBC: 3.32 MIL/uL — ABNORMAL LOW (ref 4.22–5.81)
RDW: 12.9 % (ref 11.5–15.5)
WBC: 5.2 10*3/uL (ref 4.0–10.5)
nRBC: 0 % (ref 0.0–0.2)

## 2019-08-02 NOTE — Progress Notes (Signed)
Radiation Oncology Follow up Note  Name: Scott Harding   Date:   08/02/2019 MRN:  [id number] DOB: 1956/02/13    This 64 y.o. 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  Seen today in routine follow-up he is doing well he specifically denies any dysphagia head and neck pain.  Taste has returned fairly to normal.  He is having no dry mouth.  He had a CT scan performed yesterday showing.  No evidence of recurrent disease he does have a 19 mm necrotic level 2 lymph node which is stable and was not hypermetabolic on prior PET CT scan.  COMPLICATIONS OF TREATMENT: none  FOLLOW UP COMPLIANCE: keeps appointments   PHYSICAL EXAM:  BP (!) 140/91 (BP Location: Right Arm, Patient Position: Sitting, Cuff Size: Normal)   Pulse 68   Temp 97.9 F (36.6 C) (Tympanic)   Resp 16   Wt 213 lb 11.2 oz (96.9 kg)   BMI 31.56 kg/m  No evidence of cervical or supraclavicular adenopathy.  Well-developed well-nourished patient in NAD. HEENT reveals PERLA, EOMI, discs not visualized.  Oral cavity is clear. No oral mucosal lesions are identified. Neck is clear without evidence of cervical or supraclavicular adenopathy. Lungs are clear to A&P. Cardiac examination is essentially unremarkable with regular rate and rhythm without murmur rub or thrill. Abdomen is benign with no organomegaly or masses noted. Motor sensory and DTR levels are equal and symmetric in the upper and lower extremities. Cranial nerves II through XII are grossly intact. Proprioception is intact. No peripheral adenopathy or edema is identified. No motor or sensory levels are noted. Crude visual fields are  within normal range.  RADIOLOGY RESULTS: CT scan reviewed compatible with above-stated findings  PLAN: Present time patient is doing well with no evidence of disease by CT criteria and pleased with his overall progress.  He continues close follow-up care with both ENT and medical oncology.  I have asked to see him back in 6 months for follow-up.  Patient knows to call with any concerns at any time.  I would like to take this opportunity to thank you for allowing me to participate in the care of your patient.Noreene Filbert, MD

## 2019-08-06 ENCOUNTER — Other Ambulatory Visit: Payer: Self-pay | Admitting: Oncology

## 2019-08-06 ENCOUNTER — Other Ambulatory Visit: Payer: Self-pay | Admitting: Unknown Physician Specialty

## 2019-08-09 ENCOUNTER — Ambulatory Visit: Payer: 59 | Attending: Internal Medicine

## 2019-08-09 DIAGNOSIS — Z23 Encounter for immunization: Secondary | ICD-10-CM

## 2019-08-09 NOTE — Progress Notes (Signed)
   Covid-19 Vaccination Clinic  Name:  Scott Harding    MRN: [id number] DOB: 10/31/1955  08/09/2019  [name] was observed post Covid-19 immunization for 15 minutes without incident. He was provided with Vaccine Information Sheet and instruction to access the V-Safe system.   [name] was instructed to call 911 with any severe reactions post vaccine: Marland Kitchen Difficulty breathing  . Swelling of face and throat  . A fast heartbeat  . A bad rash all over body  . Dizziness and weakness   Immunizations Administered    Name Date Dose VIS Date Route   Pfizer COVID-19 Vaccine 08/09/2019 11:55 AM 0.3 mL 06/13/2018 Intramuscular   Manufacturer: [place]   Lot: [id number]   [place]: [id number]

## 2019-08-10 ENCOUNTER — Other Ambulatory Visit: Payer: Self-pay | Admitting: Emergency Medicine

## 2019-08-10 ENCOUNTER — Telehealth: Payer: Self-pay | Admitting: Emergency Medicine

## 2019-08-10 DIAGNOSIS — C099 Malignant neoplasm of tonsil, unspecified: Secondary | ICD-10-CM

## 2019-08-10 NOTE — Telephone Encounter (Signed)
Called pt to let him know that we would be scheduling him for a PET scan in 3 months per recommendation of tumor board on 4/22, and then we would see him 1-2 days later. Pt stated that he had seen Dr. Tami Ribas and was told that this was going to be done, but was wondering if this had been approved through insurance yet. Explained to pt that we had no yet gotten authorization, but we would work on it, and if it was not approved we would instead do a CT in 3 months. Pt asked that if we do a CT instead, that we please do all of his lab work on the day of the CT instead of just a creatinine in order to save him the lab bill of just a creatinine on the day of the CT. Will send message to scheduling to schedule PET and follow up.

## 2019-09-04 ENCOUNTER — Ambulatory Visit: Payer: 59 | Attending: Internal Medicine

## 2019-09-04 DIAGNOSIS — Z23 Encounter for immunization: Secondary | ICD-10-CM

## 2019-09-04 NOTE — Progress Notes (Signed)
   Covid-19 Vaccination Clinic  Name:  Scott Harding    MRN: [id number] DOB: [birth year]  09/04/2019  [name] was observed post Covid-19 immunization for 15 minutes without incident. He was provided with Vaccine Information Sheet and instruction to access the V-Safe system.   [name] was instructed to call 911 with any severe reactions post vaccine: Marland Kitchen Difficulty breathing  . Swelling of face and throat  . A fast heartbeat  . A bad rash all over body  . Dizziness and weakness   Immunizations Administered    Name Date Dose VIS Date Route   Pfizer COVID-19 Vaccine 09/04/2019 11:54 AM 0.3 mL 06/13/2018 Intramuscular   Manufacturer: [place]   Lot: [id number]   [place]: [id number]

## 2019-11-05 ENCOUNTER — Telehealth: Payer: Self-pay | Admitting: Oncology

## 2019-11-05 NOTE — Telephone Encounter (Signed)
11/05/2019 Left voicemail alerting pt that PET scan that was scheduled for 7/21 has been cancelled due to there being no insurance authorization. Also informed patient that his follow-up with Dr. Grayland Ormond that was scheduled for 7/27 has also been cancelled and that we will let him know once we have authorization and will confirm new appts with him  SRW

## 2019-11-07 ENCOUNTER — Ambulatory Visit: Payer: 59

## 2019-11-08 ENCOUNTER — Other Ambulatory Visit: Payer: 59

## 2019-11-08 ENCOUNTER — Ambulatory Visit: Payer: 59 | Admitting: Oncology

## 2019-11-10 NOTE — Progress Notes (Signed)
[facility]  [phone] [phone]  ID: Scott Harding OB: 30-May-1955  MR#: [id number]  UUE#:[id number]  Patient Care Team: Idelle Crouch, MD as PCP - General (Internal Medicine) Beverly Gust, MD (Otolaryngology) Lloyd Huger, MD as Consulting Physician (Oncology) Noreene Filbert, MD as Referring Physician (Radiation Oncology)  CHIEF COMPLAINT: Stage IVa squamous cell carcinoma of the left tonsil, P16+.   INTERVAL HISTORY: Patient returns to clinic today for further evaluation and discussion of his PET scan results.  He continues to feel well remains asymptomatic.  He denies any weakness or fatigue.  He does not complain of dysphagia. He has a good appetite and denies weight loss.  He has no neurologic complaints.  He denies any recent fevers or illnesses. He has no chest pain, shortness of breath, cough, or hemoptysis.  He denies any nausea, vomiting, constipation, or diarrhea.  He has no urinary complaints.  Patient feels at his baseline and offers no specific complaints today.  REVIEW OF SYSTEMS:   Review of Systems  Constitutional: Negative.  Negative for fever, malaise/fatigue and weight loss.  Respiratory: Negative.  Negative for cough, hemoptysis and shortness of breath.   Cardiovascular: Negative.  Negative for chest pain and leg swelling.  Gastrointestinal: Negative.  Negative for abdominal pain.  Genitourinary: Negative.  Negative for dysuria.  Musculoskeletal: Negative.  Negative for neck pain.  Skin: Negative.  Negative for rash.  Neurological: Negative.  Negative for dizziness, focal weakness, weakness and headaches.  Psychiatric/Behavioral: Negative.  The patient is not nervous/anxious.     As per HPI. Otherwise, a complete review of systems is negative.  PAST MEDICAL HISTORY: Past Medical History:  Diagnosis Date  . Cancer ([place])   . Coronary artery disease   . Hypertension     PAST SURGICAL HISTORY: Past  Surgical History:  Procedure Laterality Date  . CORONARY ARTERY BYPASS GRAFT  2010   Quadruple    FAMILY HISTORY: Family History  Problem Relation Age of Onset  . Arthritis Mother   . Heart attack Father   . Brain cancer Sister     ADVANCED DIRECTIVES (Y/N):  N  HEALTH MAINTENANCE: Social History   Tobacco Use  . Smoking status: Former Smoker    Types: Cigarettes    Quit date: 10/17/2008    Years since quitting: 11.0  . Smokeless tobacco: Never Used  Vaping Use  . Vaping Use: Never used  Substance Use Topics  . Alcohol use: Yes    Comment: occasionally  . Drug use: Never     Colonoscopy:  PAP:  Bone density:  Lipid panel:  No Known Allergies  Current Outpatient Medications  Medication Sig Dispense Refill  . amLODipine (NORVASC) 10 MG tablet     . ascorbic acid (VITAMIN C) 1000 MG tablet Take 1 tablet by mouth.    Marland Kitchen aspirin 325 MG tablet Take 325 mg by mouth daily.    Marland Kitchen atorvastatin (LIPITOR) 80 MG tablet Take 1 tablet by mouth 1 day or 1 dose.    . DENTA 5000 PLUS 1.1 % CREA dental cream BRUSH WITH PASTE 2 3 TIMES PER DAY FOR AT LEAST 1 MINUTE    . lovastatin (MEVACOR) 40 MG tablet Take 40 mg by mouth at bedtime.    Marland Kitchen olmesartan-hydrochlorothiazide (BENICAR HCT) 40-12.5 MG tablet Take 1 tablet by mouth 1 day or 1 dose.    . ondansetron (ZOFRAN) 8 MG tablet Take 1 tablet (8 mg total) by mouth 2 (two) times daily  as needed. 30 tablet 1  . prochlorperazine (COMPAZINE) 10 MG tablet Take 1 tablet (10 mg total) by mouth every 6 (six) hours as needed (Nausea or vomiting). 30 tablet 1  . rosuvastatin (CRESTOR) 40 MG tablet Take 40 mg by mouth daily.    . metoprolol succinate (TOPROL-XL) 25 MG 24 hr tablet Take 1 tablet by mouth 1 day or 1 dose.     No current facility-administered medications for this visit.    OBJECTIVE: Vitals:   11/13/19 1108  BP: (!) 141/83  Pulse: 66  Resp: 20  Temp: (!) 97 F (36.1 C)  SpO2: 100%     Body mass index is 32.64 kg/m.     ECOG FS:0 - Asymptomatic  General: Well-developed, well-nourished, no acute distress. Eyes: Pink conjunctiva, anicteric sclera. HEENT: Normocephalic, moist mucous membranes.  Clear oropharynx, no palpable lymphadenopathy. Lungs: No audible wheezing or coughing. Heart: Regular rate and rhythm. Abdomen: Soft, nontender, no obvious distention. Musculoskeletal: No edema, cyanosis, or clubbing. Neuro: Alert, answering all questions appropriately. Cranial nerves grossly intact. Skin: No rashes or petechiae noted. Psych: Normal affect.   LAB RESULTS:  Lab Results  Component Value Date   NA 135 11/13/2019   K 4.1 11/13/2019   CL 100 11/13/2019   CO2 25 11/13/2019   GLUCOSE 109 (H) 11/13/2019   BUN 32 (H) 11/13/2019   CREATININE 2.14 (H) 11/13/2019   CALCIUM 8.9 11/13/2019   GFRNONAA 32 (L) 11/13/2019   GFRAA 37 (L) 11/13/2019    Lab Results  Component Value Date   WBC 4.9 11/13/2019   NEUTROABS 2.8 11/13/2019   HGB 11.3 (L) 11/13/2019   HCT 32.5 (L) 11/13/2019   MCV 96.4 11/13/2019   PLT 165 11/13/2019     STUDIES: NM PET Image Restag (PS) Skull Base To Thigh  Result Date: 11/12/2019 CLINICAL DATA:  Subsequent treatment strategy for left tonsillar cancer. EXAM: NUCLEAR MEDICINE PET SKULL BASE TO THIGH TECHNIQUE: 12.3 mCi F-18 FDG was injected intravenously. Full-ring PET imaging was performed from the skull base to thigh after the radiotracer. CT data was obtained and used for attenuation correction and anatomic localization. Fasting blood glucose: 106 mg/dl COMPARISON:  CT neck dated 08/01/2019.  PET-CT dated 01/24/2019. FINDINGS: Mediastinal blood pool activity: SUV max 3.5 Liver activity: SUV max NA NECK: No abnormal hypermetabolism in the left tonsillar region suspicious for recurrence. 16 mm short axis necrotic left level 2 node with rim calcification (series 4/image 37), previously 19 mm, non FDG avid. Incidental CT findings: none CHEST: No hypermetabolic thoracic  lymphadenopathy. No suspicious pulmonary nodules. Incidental CT findings: Mild atherosclerotic calcifications of the aortic arch. 3 vessel coronary atherosclerosis. Postsurgical changes related to prior CABG. ABDOMEN/PELVIS: No abnormal hypermetabolism in the liver, spleen, pancreas, or adrenal glands. No hypermetabolic abdominopelvic lymphadenopathy. Focal hypermetabolism in the left anterior central gland of the prostate, max SUV 5.8 (PET image 199), nonspecific. Incidental CT findings: Atherosclerotic calcifications of the abdominal aorta and branch vessels. Thick-walled bladder. SKELETON: No focal hypermetabolic activity to suggest skeletal metastasis. Incidental CT findings: Median sternotomy. Mild degenerative changes of the visualized thoracolumbar spine. IMPRESSION: 16 mm short axis necrotic left level 2 cervical node, mildly improved and non FDG avid. Otherwise, no findings suspicious for recurrent or metastatic disease. Focal hypermetabolism in the left anterior central gland of the prostate, nonspecific. Correlate with PSA and consider urology consultation if clinically warranted. Electronically Signed   By: Julian Hy M.D.   On: 11/12/2019 15:59    ASSESSMENT: Stage IVa  squamous cell carcinoma of the left tonsil, P16+.   PLAN:    1. 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  PET scan results from November 12, 2019 reviewed independently and report as above with no obvious evidence of recurrent or progressive disease.  Level 2 lymph node is no longer hypermetabolic.  No further imaging is necessary unless there is suspicion of recurrence.  Patient reports a recent normal exam by ENT.  Continue follow-up with ENT as scheduled.  Return to clinic in 6 months with repeat laboratory work and further evaluation.   2.  Elevated creatinine: Patient's creatinine has trended up to 2.14.  Continue to monitor and consider referral  to nephrology. 3.  Anemia: Chronic and unchanged.  Patient's hemoglobin is 11.3. 4.  Hypertension: Chronic and unchanged.  Patient's blood pressure is moderately elevated today.  Continue treatment per primary care.   Patient expressed understanding and was in agreement with this plan. He also understands that He can call clinic at any time with any questions, concerns, or complaints.   Cancer Staging Squamous cell carcinoma of left tonsil ([place]) Staging form: Pharynx - HPV-Mediated Oropharynx, AJCC 8th Edition - Clinical: No stage assigned - Unsigned   Lloyd Huger, MD   11/15/2019 5:45 AM

## 2019-11-12 ENCOUNTER — Ambulatory Visit (HOSPITAL_COMMUNITY)
Admission: RE | Admit: 2019-11-12 | Discharge: 2019-11-12 | Disposition: A | Discharge status: Home / Self Care | Payer: 59 | Source: Ambulatory Visit | Attending: Oncology | Admitting: Oncology

## 2019-11-12 ENCOUNTER — Other Ambulatory Visit: Payer: Self-pay

## 2019-11-12 DIAGNOSIS — C099 Malignant neoplasm of tonsil, unspecified: Secondary | ICD-10-CM | POA: Insufficient documentation

## 2019-11-12 LAB — GLUCOSE, CAPILLARY: Glucose-Capillary: 106 mg/dL — ABNORMAL HIGH (ref 70–99)

## 2019-11-12 MED ORDER — FLUDEOXYGLUCOSE F - 18 (FDG) INJECTION
12.3100 | Freq: Once | INTRAVENOUS | Status: AC | PRN
  Administered 2019-11-12: 12.31 via INTRAVENOUS

## 2019-11-13 ENCOUNTER — Encounter: Payer: Self-pay | Admitting: Oncology

## 2019-11-13 ENCOUNTER — Inpatient Hospital Stay: Payer: 59 | Attending: Oncology | Admitting: Oncology

## 2019-11-13 ENCOUNTER — Inpatient Hospital Stay: Payer: 59

## 2019-11-13 ENCOUNTER — Other Ambulatory Visit: Payer: Self-pay

## 2019-11-13 VITALS — BP 141/83 | HR 66 | Temp 97.0°F | Resp 20 | Wt 221.0 lb

## 2019-11-13 DIAGNOSIS — I10 Essential (primary) hypertension: Secondary | ICD-10-CM | POA: Diagnosis not present

## 2019-11-13 DIAGNOSIS — Z87891 Personal history of nicotine dependence: Secondary | ICD-10-CM | POA: Insufficient documentation

## 2019-11-13 DIAGNOSIS — Z7982 Long term (current) use of aspirin: Secondary | ICD-10-CM | POA: Insufficient documentation

## 2019-11-13 DIAGNOSIS — D649 Anemia, unspecified: Secondary | ICD-10-CM | POA: Diagnosis not present

## 2019-11-13 DIAGNOSIS — Z85818 Personal history of malignant neoplasm of other sites of lip, oral cavity, and pharynx: Secondary | ICD-10-CM | POA: Diagnosis not present

## 2019-11-13 DIAGNOSIS — C099 Malignant neoplasm of tonsil, unspecified: Secondary | ICD-10-CM | POA: Diagnosis not present

## 2019-11-13 DIAGNOSIS — I2510 Atherosclerotic heart disease of native coronary artery without angina pectoris: Secondary | ICD-10-CM | POA: Insufficient documentation

## 2019-11-13 DIAGNOSIS — Z79899 Other long term (current) drug therapy: Secondary | ICD-10-CM | POA: Diagnosis not present

## 2019-11-13 DIAGNOSIS — Z8249 Family history of ischemic heart disease and other diseases of the circulatory system: Secondary | ICD-10-CM | POA: Diagnosis not present

## 2019-11-13 LAB — BASIC METABOLIC PANEL
Anion gap: 10 (ref 5–15)
BUN: 32 mg/dL — ABNORMAL HIGH (ref 8–23)
CO2: 25 mmol/L (ref 22–32)
Calcium: 8.9 mg/dL (ref 8.9–10.3)
Chloride: 100 mmol/L (ref 98–111)
Creatinine, Ser: 2.14 mg/dL — ABNORMAL HIGH (ref 0.61–1.24)
GFR calc Af Amer: 37 mL/min — ABNORMAL LOW (ref >60)
GFR calc non Af Amer: 32 mL/min — ABNORMAL LOW (ref >60)
Glucose, Bld: 109 mg/dL — ABNORMAL HIGH (ref 70–99)
Potassium: 4.1 mmol/L (ref 3.5–5.1)
Sodium: 135 mmol/L (ref 135–145)

## 2019-11-13 LAB — CBC WITH DIFFERENTIAL/PLATELET
Abs Immature Granulocytes: 0.02 10*3/uL (ref 0.00–0.07)
Basophils Absolute: 0 10*3/uL (ref 0.0–0.1)
Basophils Relative: 1 %
Eosinophils Absolute: 0.4 10*3/uL (ref 0.0–0.5)
Eosinophils Relative: 7 %
HCT: 32.5 % — ABNORMAL LOW (ref 39.0–52.0)
Hemoglobin: 11.3 g/dL — ABNORMAL LOW (ref 13.0–17.0)
Immature Granulocytes: 0 %
Lymphocytes Relative: 17 %
Lymphs Abs: 0.9 10*3/uL (ref 0.7–4.0)
MCH: 33.5 pg (ref 26.0–34.0)
MCHC: 34.8 g/dL (ref 30.0–36.0)
MCV: 96.4 fL (ref 80.0–100.0)
Monocytes Absolute: 0.9 10*3/uL (ref 0.1–1.0)
Monocytes Relative: 18 %
Neutro Abs: 2.8 10*3/uL (ref 1.7–7.7)
Neutrophils Relative %: 57 %
Platelets: 165 10*3/uL (ref 150–400)
RBC: 3.37 MIL/uL — ABNORMAL LOW (ref 4.22–5.81)
RDW: 12.6 % (ref 11.5–15.5)
WBC: 4.9 10*3/uL (ref 4.0–10.5)
nRBC: 0 % (ref 0.0–0.2)

## 2020-01-07 ENCOUNTER — Encounter: Payer: Self-pay | Admitting: *Deleted

## 2020-02-07 ENCOUNTER — Ambulatory Visit: Payer: 59 | Admitting: Radiation Oncology

## 2020-02-07 ENCOUNTER — Other Ambulatory Visit: Payer: 59

## 2020-02-07 ENCOUNTER — Ambulatory Visit: Payer: 59 | Admitting: Oncology

## 2020-02-15 ENCOUNTER — Ambulatory Visit: Payer: 59 | Admitting: Radiation Oncology

## 2020-02-21 ENCOUNTER — Ambulatory Visit: Payer: 59 | Admitting: Radiation Oncology

## 2020-05-16 NOTE — Progress Notes (Signed)
[place]  [phone] [phone]) [phone]  ID: [name] OB: [birth year]  MR#: [id number]  TGG#:[id number]  Patient Care Team: Idelle Crouch, MD as PCP - General (Internal Medicine) Beverly Gust, MD (Otolaryngology) Lloyd Huger, MD as Consulting Physician (Oncology) Noreene Filbert, MD as Referring Physician (Radiation Oncology)  CHIEF COMPLAINT: Stage IVa squamous cell carcinoma of the left tonsil, P16+.   INTERVAL HISTORY: Patient returns to clinic today for routine 53-month evaluation.  He continues to feel well and remains asymptomatic.  He denies any weakness or fatigue.  He does not complain of dysphagia. He has a good appetite and denies weight loss.  He has no neurologic complaints.  He denies any recent fevers or illnesses. He has no chest pain, shortness of breath, cough, or hemoptysis.  He denies any nausea, vomiting, constipation, or diarrhea.  He has no urinary complaints.  Patient offers no specific complaints today.    REVIEW OF SYSTEMS:   Review of Systems  Constitutional: Negative.  Negative for fever, malaise/fatigue and weight loss.  Respiratory: Negative.  Negative for cough, hemoptysis and shortness of breath.   Cardiovascular: Negative.  Negative for chest pain and leg swelling.  Gastrointestinal: Negative.  Negative for abdominal pain.  Genitourinary: Negative.  Negative for dysuria.  Musculoskeletal: Negative.  Negative for neck pain.  Skin: Negative.  Negative for rash.  Neurological: Negative.  Negative for dizziness, focal weakness, weakness and headaches.  Psychiatric/Behavioral: Negative.  The patient is not nervous/anxious.     As per HPI. Otherwise, a complete review of systems is negative.  PAST MEDICAL HISTORY: Past Medical History:  Diagnosis Date  . Cancer ([place])   . Coronary artery disease   . Hypertension     PAST SURGICAL HISTORY: Past Surgical History:  Procedure Laterality Date  .  CORONARY ARTERY BYPASS GRAFT  2010   Quadruple    FAMILY HISTORY: Family History  Problem Relation Age of Onset  . Arthritis Mother   . Heart attack Father   . Brain cancer Sister     ADVANCED DIRECTIVES (Y/N):  N  HEALTH MAINTENANCE: Social History   Tobacco Use  . Smoking status: Former Smoker    Types: Cigarettes    Quit date: 10/17/2008    Years since quitting: 11.6  . Smokeless tobacco: Never Used  Vaping Use  . Vaping Use: Never used  Substance Use Topics  . Alcohol use: Yes    Comment: occasionally  . Drug use: Never     Colonoscopy:  PAP:  Bone density:  Lipid panel:  No Known Allergies  Current Outpatient Medications  Medication Sig Dispense Refill  . amLODipine (NORVASC) 10 MG tablet     . ascorbic acid (VITAMIN C) 1000 MG tablet Take 1 tablet by mouth.    Marland Kitchen aspirin 325 MG tablet Take 325 mg by mouth daily.    Marland Kitchen olmesartan (BENICAR) 40 MG tablet Take 40 mg by mouth daily.    . DENTA 5000 PLUS 1.1 % CREA dental cream BRUSH WITH PASTE 2 3 TIMES PER DAY FOR AT LEAST 1 MINUTE    . hydrALAZINE (APRESOLINE) 50 MG tablet Take 50 mg by mouth 2 (two) times daily.    . metoprolol succinate (TOPROL-XL) 25 MG 24 hr tablet Take 1 tablet by mouth 1 day or 1 dose.    . ondansetron (ZOFRAN) 8 MG tablet Take 1 tablet (8 mg total) by mouth 2 (two) times daily as needed. (Patient not taking: No sig reported)  30 tablet 1  . prochlorperazine (COMPAZINE) 10 MG tablet Take 1 tablet (10 mg total) by mouth every 6 (six) hours as needed (Nausea or vomiting). (Patient not taking: No sig reported) 30 tablet 1   No current facility-administered medications for this visit.    OBJECTIVE: There were no vitals filed for this visit.   There is no height or weight on file to calculate BMI.    ECOG FS:0 - Asymptomatic  General: Well-developed, well-nourished, no acute distress. Eyes: Pink conjunctiva, anicteric sclera. HEENT: Normocephalic, moist mucous membranes. Lungs: No audible  wheezing or coughing. Heart: Regular rate and rhythm. Abdomen: Soft, nontender, no obvious distention. Musculoskeletal: No edema, cyanosis, or clubbing. Neuro: Alert, answering all questions appropriately. Cranial nerves grossly intact. Skin: No rashes or petechiae noted. Psych: Normal affect.  LAB RESULTS:  Lab Results  Component Value Date   NA 139 05/20/2020   K 4.2 05/20/2020   CL 106 05/20/2020   CO2 23 05/20/2020   GLUCOSE 97 05/20/2020   BUN 24 (H) 05/20/2020   CREATININE 1.78 (H) 05/20/2020   CALCIUM 9.5 05/20/2020   GFRNONAA 42 (L) 05/20/2020   GFRAA 37 (L) 11/13/2019    Lab Results  Component Value Date   WBC 6.2 05/20/2020   NEUTROABS 4.1 05/20/2020   HGB 13.4 05/20/2020   HCT 39.4 05/20/2020   MCV 94.3 05/20/2020   PLT 187 05/20/2020     STUDIES: No results found.  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   PLAN:    1. 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  PET scan results from November 12, 2019 reviewed independently with no obvious evidence of recurrent or progressive disease.  Level 2 lymph node is no longer hypermetabolic.  No further imaging is necessary unless there is suspicion of recurrence.  Patient reports a normal exam by ENT and has follow-up with them later this week.  Return to clinic in 6 months with laboratory work and video assisted telemedicine visit. 2.  Elevated creatinine: Improved to 1.78.  Monitor.  Continue follow-up with primary care. 3.  Anemia: Resolved. 4.  Hypertension: Chronic and unchanged.  Continue follow-up with primary care.  Patient expressed understanding and was in agreement with this plan. He also understands that He can call clinic at any time with any questions, concerns, or complaints.   Cancer Staging Squamous cell carcinoma of left tonsil ([place]) Staging form: Pharynx - HPV-Mediated Oropharynx, AJCC 8th  Edition - Clinical: No stage assigned - Unsigned   Lloyd Huger, MD   05/21/2020 6:26 AM

## 2020-05-20 ENCOUNTER — Inpatient Hospital Stay (HOSPITAL_BASED_OUTPATIENT_CLINIC_OR_DEPARTMENT_OTHER): Payer: 59 | Admitting: Oncology

## 2020-05-20 ENCOUNTER — Encounter: Payer: Self-pay | Admitting: Radiation Oncology

## 2020-05-20 ENCOUNTER — Inpatient Hospital Stay: Payer: 59 | Attending: Oncology

## 2020-05-20 ENCOUNTER — Ambulatory Visit
Admission: RE | Admit: 2020-05-20 | Discharge: 2020-05-20 | Disposition: A | Discharge status: Home / Self Care | Payer: 59 | Source: Ambulatory Visit | Attending: Radiation Oncology | Admitting: Radiation Oncology

## 2020-05-20 VITALS — BP 112/64 | HR 58 | Temp 96.6°F | Wt 217.0 lb

## 2020-05-20 DIAGNOSIS — C760 Malignant neoplasm of head, face and neck: Secondary | ICD-10-CM

## 2020-05-20 DIAGNOSIS — Z79899 Other long term (current) drug therapy: Secondary | ICD-10-CM | POA: Insufficient documentation

## 2020-05-20 DIAGNOSIS — Z7982 Long term (current) use of aspirin: Secondary | ICD-10-CM | POA: Diagnosis not present

## 2020-05-20 DIAGNOSIS — Z87891 Personal history of nicotine dependence: Secondary | ICD-10-CM | POA: Diagnosis not present

## 2020-05-20 DIAGNOSIS — Z7189 Other specified counseling: Secondary | ICD-10-CM

## 2020-05-20 DIAGNOSIS — Z862 Personal history of diseases of the blood and blood-forming organs and certain disorders involving the immune mechanism: Secondary | ICD-10-CM | POA: Diagnosis not present

## 2020-05-20 DIAGNOSIS — Z8249 Family history of ischemic heart disease and other diseases of the circulatory system: Secondary | ICD-10-CM | POA: Insufficient documentation

## 2020-05-20 DIAGNOSIS — R7989 Other specified abnormal findings of blood chemistry: Secondary | ICD-10-CM | POA: Diagnosis not present

## 2020-05-20 DIAGNOSIS — Z85819 Personal history of malignant neoplasm of unspecified site of lip, oral cavity, and pharynx: Secondary | ICD-10-CM | POA: Insufficient documentation

## 2020-05-20 DIAGNOSIS — Z85818 Personal history of malignant neoplasm of other sites of lip, oral cavity, and pharynx: Secondary | ICD-10-CM | POA: Diagnosis present

## 2020-05-20 DIAGNOSIS — I10 Essential (primary) hypertension: Secondary | ICD-10-CM | POA: Diagnosis not present

## 2020-05-20 DIAGNOSIS — C099 Malignant neoplasm of tonsil, unspecified: Secondary | ICD-10-CM | POA: Diagnosis not present

## 2020-05-20 DIAGNOSIS — Z923 Personal history of irradiation: Secondary | ICD-10-CM | POA: Insufficient documentation

## 2020-05-20 LAB — CBC WITH DIFFERENTIAL/PLATELET
Abs Immature Granulocytes: 0.01 10*3/uL (ref 0.00–0.07)
Basophils Absolute: 0 10*3/uL (ref 0.0–0.1)
Basophils Relative: 1 %
Eosinophils Absolute: 0.2 10*3/uL (ref 0.0–0.5)
Eosinophils Relative: 3 %
HCT: 39.4 % (ref 39.0–52.0)
Hemoglobin: 13.4 g/dL (ref 13.0–17.0)
Immature Granulocytes: 0 %
Lymphocytes Relative: 15 %
Lymphs Abs: 1 10*3/uL (ref 0.7–4.0)
MCH: 32.1 pg (ref 26.0–34.0)
MCHC: 34 g/dL (ref 30.0–36.0)
MCV: 94.3 fL (ref 80.0–100.0)
Monocytes Absolute: 0.8 10*3/uL (ref 0.1–1.0)
Monocytes Relative: 13 %
Neutro Abs: 4.1 10*3/uL (ref 1.7–7.7)
Neutrophils Relative %: 68 %
Platelets: 187 10*3/uL (ref 150–400)
RBC: 4.18 MIL/uL — ABNORMAL LOW (ref 4.22–5.81)
RDW: 12.4 % (ref 11.5–15.5)
WBC: 6.2 10*3/uL (ref 4.0–10.5)
nRBC: 0 % (ref 0.0–0.2)

## 2020-05-20 LAB — PSA: Prostatic Specific Antigen: 2.49 ng/mL (ref 0.00–4.00)

## 2020-05-20 LAB — BASIC METABOLIC PANEL
Anion gap: 10 (ref 5–15)
BUN: 24 mg/dL — ABNORMAL HIGH (ref 8–23)
CO2: 23 mmol/L (ref 22–32)
Calcium: 9.5 mg/dL (ref 8.9–10.3)
Chloride: 106 mmol/L (ref 98–111)
Creatinine, Ser: 1.78 mg/dL — ABNORMAL HIGH (ref 0.61–1.24)
GFR, Estimated: 42 mL/min — ABNORMAL LOW (ref >60)
Glucose, Bld: 97 mg/dL (ref 70–99)
Potassium: 4.2 mmol/L (ref 3.5–5.1)
Sodium: 139 mmol/L (ref 135–145)

## 2020-05-20 NOTE — Progress Notes (Unsigned)
Pt in for follow up, seen in radiation today. Denies any concerns today.

## 2020-05-20 NOTE — Progress Notes (Signed)
Radiation Oncology Follow up Note  Name: Scott Harding   Date:   05/20/2020 MRN:  [id number] DOB: July 05, 1955    This 65 y.o. male presents to the clinic today for 76-month follow-up status post concurrent chemoradiation therapy for stage III squamous cell carcinoma left tonsillar pillar.  REFERRING PROVIDER: Idelle Crouch, MD  HPI: Patient is a 65 year old male.  Now at 14 months having received concurrent chemoradiation therapy for stage III (T2 N1 M0) squamous cell carcinoma left tonsillar pillar seen today in routine follow-up he is doing well specifically denies head and neck pain dysphagia.  He is being scoped regularly by ENT with no evidence of disease.  Recent PET CT scan back in July showed a short axial level 2 cervical lymph node mildly improved non-FDG avid.  No other findings suspicious recurrent or metastatic head and neck cancer.  He did have some hypermetabolic activity in his prostate I am ordering a PSA for that.  COMPLICATIONS OF TREATMENT: none  FOLLOW UP COMPLIANCE: keeps appointments   PHYSICAL EXAM:  BP 112/64   Pulse (!) 58   Temp (!) 96.6 F (35.9 C) (Tympanic)   Wt 217 lb (98.4 kg)   BMI 32.05 kg/m  No evidence of cervical or supraclavicular adenopathy is appreciated.  Well-developed well-nourished patient in NAD. HEENT reveals PERLA, EOMI, discs not visualized.  Oral cavity is clear. No oral mucosal lesions are identified. Neck is clear without evidence of cervical or supraclavicular adenopathy. Lungs are clear to A&P. Cardiac examination is essentially unremarkable with regular rate and rhythm without murmur rub or thrill. Abdomen is benign with no organomegaly or masses noted. Motor sensory and DTR levels are equal and symmetric in the upper and lower extremities. Cranial nerves II through XII are grossly intact. Proprioception is intact. No peripheral adenopathy or edema is identified. No motor or sensory levels are noted. Crude visual fields are within  normal range.  RADIOLOGY RESULTS: PET CT scan reviewed compatible with above-stated findings  PLAN: At this time he has no evidence of recurrent head and neck cancer by exam or PET/CT criteria.  I am ordering a PSA based on the findings of hypermetabolic [place] in his prostate.  Should his PSA be elevated will refer to urology for biopsy and treatment options.  Otherwise have asked to see him back in 1 year for follow-up.  Patient knows to call with any concerns.  I would like to take this opportunity to thank you for allowing me to participate in the care of your patient.Noreene Filbert, MD

## 2020-09-22 ENCOUNTER — Other Ambulatory Visit: Payer: Self-pay | Admitting: Internal Medicine

## 2020-09-22 DIAGNOSIS — N179 Acute kidney failure, unspecified: Secondary | ICD-10-CM

## 2020-09-24 ENCOUNTER — Other Ambulatory Visit: Payer: Self-pay | Admitting: *Deleted

## 2020-09-24 DIAGNOSIS — C099 Malignant neoplasm of tonsil, unspecified: Secondary | ICD-10-CM

## 2020-10-01 ENCOUNTER — Ambulatory Visit
Admission: RE | Admit: 2020-10-01 | Discharge: 2020-10-01 | Disposition: A | Discharge status: Home / Self Care | Payer: 59 | Source: Ambulatory Visit | Attending: Internal Medicine | Admitting: Internal Medicine

## 2020-10-01 ENCOUNTER — Other Ambulatory Visit: Payer: Self-pay

## 2020-10-01 DIAGNOSIS — N179 Acute kidney failure, unspecified: Secondary | ICD-10-CM | POA: Diagnosis not present

## 2020-10-02 ENCOUNTER — Telehealth: Payer: Self-pay | Admitting: Oncology

## 2020-10-02 NOTE — Telephone Encounter (Signed)
Received notification via secure chat from [facility] that PET scan needed to be canceled due to insurance authorization. Called patient and left detailed Vm that scan was canceled and follow-up appt on 6/23. Will call and notify patient once scans are approved for rescheduling.

## 2020-10-06 ENCOUNTER — Ambulatory Visit: Admission: RE | Admit: 2020-10-06 | Payer: 59 | Source: Ambulatory Visit

## 2020-10-09 ENCOUNTER — Ambulatory Visit: Payer: 59 | Admitting: Oncology

## 2020-10-27 ENCOUNTER — Telehealth: Payer: Self-pay

## 2020-10-27 NOTE — Telephone Encounter (Signed)
Our office could not obtain PA for PET due to insurance needing documentation related to ENT issues.  Called Dr. Reggy [facility] office to make them aware that we were unable to obtain PA for PET.

## 2020-10-30 ENCOUNTER — Encounter: Payer: Self-pay | Admitting: Oncology

## 2020-10-31 ENCOUNTER — Encounter: Payer: Self-pay | Admitting: Nephrology

## 2020-11-18 ENCOUNTER — Encounter: Payer: Self-pay | Admitting: Oncology

## 2020-11-19 ENCOUNTER — Ambulatory Visit: Payer: 59 | Admitting: Urology

## 2020-11-19 ENCOUNTER — Encounter: Payer: Self-pay | Admitting: Urology

## 2020-11-19 ENCOUNTER — Other Ambulatory Visit: Payer: Self-pay

## 2020-11-19 VITALS — BP 107/66 | HR 80 | Ht 69.0 in | Wt 215.0 lb

## 2020-11-19 DIAGNOSIS — N400 Enlarged prostate without lower urinary tract symptoms: Secondary | ICD-10-CM | POA: Diagnosis not present

## 2020-11-19 DIAGNOSIS — R9389 Abnormal findings on diagnostic imaging of other specified body structures: Secondary | ICD-10-CM

## 2020-11-19 NOTE — Progress Notes (Signed)
11/19/2020 1:34 PM   Scott Harding 1955/10/02 [id number]  Referring provider: Anthonette Legato, MD [place],  [place] [address]  Chief Complaint  Patient presents with   Other    HPI: Scott Harding is a 65 y.o. male referred for evaluation of abnormality of the prostate seen on bladder survey views of a renal ultrasound.  Renal ultrasound performed 10/01/2020 on survey views of the bladder showed an hypoechoic area in the central aspect of the prostate felt may represent a TURP defect Patient has no prior history of urologic problems and no voiding complaints.  No previous prostate surgery. PSA 09/03/2020 was stable at 2.71 He does have a history of head neck cancer and a PET/CT performed 11/12/2019 incidentally showed a nonspecific hypermetabolic area in the left anterior central gland.  No adenopathy was present   PMH: Past Medical History:  Diagnosis Date   Cancer ([place])    Coronary artery disease    Hypertension     Surgical History: Past Surgical History:  Procedure Laterality Date   CORONARY ARTERY BYPASS GRAFT  2010   Quadruple    Home Medications:  Allergies as of 11/19/2020   No Known Allergies      Medication List        Accurate as of November 19, 2020  1:34 PM. If you have any questions, ask your nurse or doctor.          amLODipine 10 MG tablet Commonly known as: NORVASC   ascorbic acid 1000 MG tablet Commonly known as: VITAMIN C Take 1 tablet by mouth.   aspirin 325 MG tablet Take 325 mg by mouth daily.   Denta 5000 Plus 1.1 % Crea dental cream Generic drug: sodium fluoride BRUSH WITH PASTE 2 3 TIMES PER DAY FOR AT LEAST 1 MINUTE   hydrALAZINE 50 MG tablet Commonly known as: APRESOLINE Take 50 mg by mouth 2 (two) times daily.   metoprolol succinate 25 MG 24 hr tablet Commonly known as: TOPROL-XL Take 1 tablet by mouth 1 day or 1 dose.   olmesartan 40 MG tablet Commonly known as: BENICAR Take 40 mg by mouth daily.    ondansetron 8 MG tablet Commonly known as: Zofran Take 1 tablet (8 mg total) by mouth 2 (two) times daily as needed.   prochlorperazine 10 MG tablet Commonly known as: COMPAZINE Take 1 tablet (10 mg total) by mouth every 6 (six) hours as needed (Nausea or vomiting).        Allergies: No Known Allergies  Family History: Family History  Problem Relation Age of Onset   Arthritis Mother    Heart attack Father    Brain cancer Sister     Social History:  reports that he quit smoking about 12 years ago. His smoking use included cigarettes. He has never used smokeless tobacco. He reports current alcohol use. He reports that he does not use drugs.   Physical Exam: BP 107/66   Pulse 80   Ht 5\' 9"  (1.753 m)   Wt 215 lb (97.5 kg)   BMI 31.75 kg/m   Constitutional:  Alert and oriented, No acute distress. HEENT: [place] AT, moist mucus membranes.  Trachea midline, no masses. Cardiovascular: No clubbing, cyanosis, or edema. Respiratory: Normal respiratory effort, no increased work of breathing. GU: Prostate 50 g, smooth without nodules Skin: No rashes, bruises or suspicious lesions. Neurologic: Grossly intact, no focal deficits, moving all 4 extremities. Psychiatric: Normal mood and affect.   Assessment &  Plan:   65 y.o. male with a hypoechoic area incidentally noted central prostate on bladder survey views of renal ultrasound.  He was also noted to have a hypermetabolic area on PET/CT 8242 His DRE is benign and PSA has been normal and stable Based on the nonspecificity of PET and ultrasound for prostate abnormalities and his benign exam and normal PSA would not recommend any further evaluation Continue annual PSA with Dr. Noel Journey, [place] [address] [place], [place] [address] [phone]

## 2020-11-20 ENCOUNTER — Other Ambulatory Visit: Payer: 59

## 2020-11-25 ENCOUNTER — Telehealth: Payer: 59 | Admitting: Oncology

## 2020-11-27 ENCOUNTER — Other Ambulatory Visit: Payer: Self-pay | Admitting: Unknown Physician Specialty

## 2020-11-27 ENCOUNTER — Other Ambulatory Visit: Payer: Self-pay

## 2020-11-27 ENCOUNTER — Ambulatory Visit
Admission: RE | Admit: 2020-11-27 | Discharge: 2020-11-27 | Disposition: A | Discharge status: Home / Self Care | Payer: 59 | Source: Ambulatory Visit | Attending: Unknown Physician Specialty | Admitting: Unknown Physician Specialty

## 2020-11-27 DIAGNOSIS — J387 Other diseases of larynx: Secondary | ICD-10-CM

## 2020-11-27 LAB — POCT I-STAT CREATININE: Creatinine, Ser: 2.1 mg/dL — ABNORMAL HIGH (ref 0.61–1.24)

## 2020-11-27 MED ORDER — IOHEXOL 350 MG/ML SOLN
75.0000 mL | Freq: Once | INTRAVENOUS | Status: AC | PRN
  Administered 2020-11-27: 60 mL via INTRAVENOUS

## 2020-11-28 ENCOUNTER — Inpatient Hospital Stay: Payer: 59

## 2020-11-28 ENCOUNTER — Inpatient Hospital Stay: Payer: 59 | Admitting: Anesthesiology

## 2020-11-28 ENCOUNTER — Encounter: Payer: Self-pay | Admitting: Unknown Physician Specialty

## 2020-11-28 ENCOUNTER — Inpatient Hospital Stay
Admission: RE | Admit: 2020-11-28 | Discharge: 2020-12-02 | DRG: 012 | Disposition: A | Discharge status: Home / Self Care | Payer: 59 | Attending: Internal Medicine | Admitting: Internal Medicine

## 2020-11-28 ENCOUNTER — Other Ambulatory Visit: Payer: Self-pay

## 2020-11-28 ENCOUNTER — Encounter
Admission: RE | Disposition: A | Discharge status: Home / Self Care | Payer: Self-pay | Source: Home / Self Care | Attending: Internal Medicine

## 2020-11-28 DIAGNOSIS — Z923 Personal history of irradiation: Secondary | ICD-10-CM

## 2020-11-28 DIAGNOSIS — N184 Chronic kidney disease, stage 4 (severe): Secondary | ICD-10-CM | POA: Diagnosis present

## 2020-11-28 DIAGNOSIS — I130 Hypertensive heart and chronic kidney disease with heart failure and stage 1 through stage 4 chronic kidney disease, or unspecified chronic kidney disease: Secondary | ICD-10-CM | POA: Diagnosis present

## 2020-11-28 DIAGNOSIS — C099 Malignant neoplasm of tonsil, unspecified: Secondary | ICD-10-CM | POA: Diagnosis present

## 2020-11-28 DIAGNOSIS — Z20822 Contact with and (suspected) exposure to covid-19: Secondary | ICD-10-CM | POA: Diagnosis present

## 2020-11-28 DIAGNOSIS — J387 Other diseases of larynx: Secondary | ICD-10-CM | POA: Diagnosis present

## 2020-11-28 DIAGNOSIS — Z951 Presence of aortocoronary bypass graft: Secondary | ICD-10-CM

## 2020-11-28 DIAGNOSIS — B965 Pseudomonas (aeruginosa) (mallei) (pseudomallei) as the cause of diseases classified elsewhere: Secondary | ICD-10-CM | POA: Diagnosis not present

## 2020-11-28 DIAGNOSIS — Z9221 Personal history of antineoplastic chemotherapy: Secondary | ICD-10-CM | POA: Diagnosis not present

## 2020-11-28 DIAGNOSIS — D020 Carcinoma in situ of larynx: Secondary | ICD-10-CM | POA: Diagnosis present

## 2020-11-28 DIAGNOSIS — I428 Other cardiomyopathies: Secondary | ICD-10-CM | POA: Diagnosis not present

## 2020-11-28 DIAGNOSIS — I5022 Chronic systolic (congestive) heart failure: Secondary | ICD-10-CM | POA: Diagnosis present

## 2020-11-28 DIAGNOSIS — J209 Acute bronchitis, unspecified: Secondary | ICD-10-CM | POA: Diagnosis not present

## 2020-11-28 DIAGNOSIS — Z87891 Personal history of nicotine dependence: Secondary | ICD-10-CM

## 2020-11-28 DIAGNOSIS — I10 Essential (primary) hypertension: Secondary | ICD-10-CM | POA: Diagnosis not present

## 2020-11-28 HISTORY — PX: TRACHEOSTOMY TUBE PLACEMENT: SHX814

## 2020-11-28 HISTORY — PX: DIRECT LARYNGOSCOPY: SHX5326

## 2020-11-28 LAB — COMPREHENSIVE METABOLIC PANEL
ALT: 13 U/L (ref 0–44)
AST: 16 U/L (ref 15–41)
Albumin: 4.6 g/dL (ref 3.5–5.0)
Alkaline Phosphatase: 38 U/L (ref 38–126)
Anion gap: 13 (ref 5–15)
BUN: 24 mg/dL — ABNORMAL HIGH (ref 8–23)
CO2: 22 mmol/L (ref 22–32)
Calcium: 9.7 mg/dL (ref 8.9–10.3)
Chloride: 105 mmol/L (ref 98–111)
Creatinine, Ser: 1.92 mg/dL — ABNORMAL HIGH (ref 0.61–1.24)
GFR, Estimated: 38 mL/min — ABNORMAL LOW (ref >60)
Glucose, Bld: 132 mg/dL — ABNORMAL HIGH (ref 70–99)
Potassium: 3.7 mmol/L (ref 3.5–5.1)
Sodium: 140 mmol/L (ref 135–145)
Total Bilirubin: 0.7 mg/dL (ref 0.3–1.2)
Total Protein: 7.8 g/dL (ref 6.5–8.1)

## 2020-11-28 LAB — CBC WITH DIFFERENTIAL/PLATELET
Abs Immature Granulocytes: 0.03 10*3/uL (ref 0.00–0.07)
Basophils Absolute: 0 10*3/uL (ref 0.0–0.1)
Basophils Relative: 0 %
Eosinophils Absolute: 0 10*3/uL (ref 0.0–0.5)
Eosinophils Relative: 0 %
HCT: 36.3 % — ABNORMAL LOW (ref 39.0–52.0)
Hemoglobin: 12.5 g/dL — ABNORMAL LOW (ref 13.0–17.0)
Immature Granulocytes: 0 %
Lymphocytes Relative: 4 %
Lymphs Abs: 0.5 10*3/uL — ABNORMAL LOW (ref 0.7–4.0)
MCH: 32.4 pg (ref 26.0–34.0)
MCHC: 34.4 g/dL (ref 30.0–36.0)
MCV: 94 fL (ref 80.0–100.0)
Monocytes Absolute: 0.3 10*3/uL (ref 0.1–1.0)
Monocytes Relative: 3 %
Neutro Abs: 9.6 10*3/uL — ABNORMAL HIGH (ref 1.7–7.7)
Neutrophils Relative %: 93 %
Platelets: 228 10*3/uL (ref 150–400)
RBC: 3.86 MIL/uL — ABNORMAL LOW (ref 4.22–5.81)
RDW: 13.7 % (ref 11.5–15.5)
WBC: 10.4 10*3/uL (ref 4.0–10.5)
nRBC: 0 % (ref 0.0–0.2)

## 2020-11-28 LAB — GLUCOSE, CAPILLARY: Glucose-Capillary: 168 mg/dL — ABNORMAL HIGH (ref 70–99)

## 2020-11-28 LAB — SARS CORONAVIRUS 2 BY RT PCR (HOSPITAL ORDER, PERFORMED IN ~~LOC~~ HOSPITAL LAB): SARS Coronavirus 2: NEGATIVE

## 2020-11-28 LAB — MRSA NEXT GEN BY PCR, NASAL: MRSA by PCR Next Gen: NOT DETECTED

## 2020-11-28 SURGERY — CREATION, TRACHEOSTOMY
Anesthesia: General | Site: Throat

## 2020-11-28 MED ORDER — ALBUTEROL SULFATE (2.5 MG/3ML) 0.083% IN NEBU
2.5000 mg | INHALATION_SOLUTION | Freq: Four times a day (QID) | RESPIRATORY_TRACT | Status: DC
  Administered 2020-11-28 – 2020-12-01 (×11): 2.5 mg via RESPIRATORY_TRACT
  Filled 2020-11-28 (×12): qty 3

## 2020-11-28 MED ORDER — SODIUM CHLORIDE 0.9 % IV SOLN: INTRAVENOUS | Status: DC

## 2020-11-28 MED ORDER — LIDOCAINE HCL (CARDIAC) PF 100 MG/5ML IV SOSY
PREFILLED_SYRINGE | INTRAVENOUS | Status: DC | PRN
  Administered 2020-11-28: 80 mg via INTRAVENOUS

## 2020-11-28 MED ORDER — DEXAMETHASONE SODIUM PHOSPHATE 10 MG/ML IJ SOLN
INTRAMUSCULAR | Status: AC
  Filled 2020-11-28: qty 1

## 2020-11-28 MED ORDER — MIDAZOLAM HCL 2 MG/2ML IJ SOLN
INTRAMUSCULAR | Status: AC
  Filled 2020-11-28: qty 2

## 2020-11-28 MED ORDER — DEXTROSE IN LACTATED RINGERS 5 % IV SOLN
INTRAVENOUS | Status: DC
Start: 2020-11-28 — End: 2020-11-29

## 2020-11-28 MED ORDER — PROPOFOL 500 MG/50ML IV EMUL
INTRAVENOUS | Status: DC | PRN
Start: 2020-11-28 — End: 2020-11-28
  Administered 2020-11-28: 140 ug/kg/min via INTRAVENOUS

## 2020-11-28 MED ORDER — PROPOFOL 10 MG/ML IV BOLUS
INTRAVENOUS | Status: DC | PRN
  Administered 2020-11-28: 100 mg via INTRAVENOUS

## 2020-11-28 MED ORDER — LACTATED RINGERS IV SOLN: INTRAVENOUS | Status: DC | PRN

## 2020-11-28 MED ORDER — FENTANYL CITRATE (PF) 100 MCG/2ML IJ SOLN
INTRAMUSCULAR | Status: DC | PRN
  Administered 2020-11-28: 100 ug via INTRAVENOUS

## 2020-11-28 MED ORDER — FENTANYL CITRATE (PF) 100 MCG/2ML IJ SOLN
INTRAMUSCULAR | Status: AC
  Filled 2020-11-28: qty 2

## 2020-11-28 MED ORDER — LIDOCAINE-EPINEPHRINE 1 %-1:100000 IJ SOLN
INTRAMUSCULAR | Status: DC | PRN
  Administered 2020-11-28: 7 mL
  Administered 2020-11-28: 6 mL

## 2020-11-28 MED ORDER — CHLORHEXIDINE GLUCONATE 0.12 % MT SOLN: 15.0000 mL | Freq: Once | OROMUCOSAL | Status: AC

## 2020-11-28 MED ORDER — LIDOCAINE-EPINEPHRINE 1 %-1:100000 IJ SOLN
INTRAMUSCULAR | Status: AC
  Filled 2020-11-28: qty 1

## 2020-11-28 MED ORDER — DEXAMETHASONE SODIUM PHOSPHATE 10 MG/ML IJ SOLN
INTRAMUSCULAR | Status: DC | PRN
  Administered 2020-11-28: 10 mg via INTRAVENOUS

## 2020-11-28 MED ORDER — SODIUM CHLORIDE 0.9 % IV SOLN: INTRAVENOUS | Status: DC | PRN

## 2020-11-28 MED ORDER — CHLORHEXIDINE GLUCONATE CLOTH 2 % EX PADS: 6.0000 | MEDICATED_PAD | Freq: Every day | CUTANEOUS | Status: DC

## 2020-11-28 MED ORDER — CHLORHEXIDINE GLUCONATE 0.12 % MT SOLN
OROMUCOSAL | Status: AC
  Administered 2020-11-28: 15 mL via OROMUCOSAL
  Filled 2020-11-28: qty 15

## 2020-11-28 MED ORDER — DEXMEDETOMIDINE HCL IN NACL 200 MCG/50ML IV SOLN
INTRAVENOUS | Status: AC
  Filled 2020-11-28: qty 50

## 2020-11-28 MED ORDER — FENTANYL CITRATE (PF) 100 MCG/2ML IJ SOLN: 50.0000 ug | INTRAMUSCULAR | Status: DC | PRN

## 2020-11-28 MED ORDER — ORAL CARE MOUTH RINSE: 15.0000 mL | Freq: Once | OROMUCOSAL | Status: AC

## 2020-11-28 MED ORDER — 0.9 % SODIUM CHLORIDE (POUR BTL) OPTIME
TOPICAL | Status: DC | PRN
  Administered 2020-11-28: 500 mL

## 2020-11-28 MED ORDER — ACETAMINOPHEN 10 MG/ML IV SOLN
INTRAVENOUS | Status: AC
  Filled 2020-11-28: qty 100

## 2020-11-28 MED ORDER — ALBUTEROL SULFATE (2.5 MG/3ML) 0.083% IN NEBU
INHALATION_SOLUTION | RESPIRATORY_TRACT | Status: AC
  Administered 2020-11-28: 2.5 mg via RESPIRATORY_TRACT
  Filled 2020-11-28: qty 3

## 2020-11-28 MED ORDER — FENTANYL CITRATE (PF) 100 MCG/2ML IJ SOLN
INTRAMUSCULAR | Status: AC
  Administered 2020-11-28: 25 ug via INTRAVENOUS
  Filled 2020-11-28: qty 2

## 2020-11-28 MED ORDER — ONDANSETRON HCL 4 MG/2ML IJ SOLN: 4.0000 mg | Freq: Once | INTRAMUSCULAR | Status: DC | PRN

## 2020-11-28 MED ORDER — SUCCINYLCHOLINE CHLORIDE 200 MG/10ML IV SOSY
PREFILLED_SYRINGE | INTRAVENOUS | Status: AC
  Filled 2020-11-28: qty 10

## 2020-11-28 MED ORDER — ROCURONIUM BROMIDE 100 MG/10ML IV SOLN
INTRAVENOUS | Status: DC | PRN
  Administered 2020-11-28: 30 mg via INTRAVENOUS

## 2020-11-28 MED ORDER — FENTANYL CITRATE (PF) 100 MCG/2ML IJ SOLN
INTRAMUSCULAR | Status: AC
  Administered 2020-11-28: 50 ug via INTRAVENOUS
  Filled 2020-11-28: qty 2

## 2020-11-28 MED ORDER — ONDANSETRON HCL 4 MG/2ML IJ SOLN: 4.0000 mg | Freq: Four times a day (QID) | INTRAMUSCULAR | Status: DC | PRN

## 2020-11-28 MED ORDER — LIDOCAINE-EPINEPHRINE 1 %-1:100000 IJ SOLN
INTRAMUSCULAR | Status: AC
  Filled 2020-11-28: qty 2

## 2020-11-28 MED ORDER — MIDAZOLAM HCL 2 MG/2ML IJ SOLN
INTRAMUSCULAR | Status: DC | PRN
  Administered 2020-11-28: 1 mg via INTRAVENOUS

## 2020-11-28 MED ORDER — PROPOFOL 500 MG/50ML IV EMUL
INTRAVENOUS | Status: AC
  Filled 2020-11-28: qty 50

## 2020-11-28 MED ORDER — SUGAMMADEX SODIUM 200 MG/2ML IV SOLN
INTRAVENOUS | Status: DC | PRN
  Administered 2020-11-28: 200 mg via INTRAVENOUS

## 2020-11-28 MED ORDER — FENTANYL CITRATE (PF) 100 MCG/2ML IJ SOLN
25.0000 ug | INTRAMUSCULAR | Status: DC | PRN
  Administered 2020-11-28 – 2020-12-02 (×6): 50 ug via INTRAVENOUS
  Filled 2020-11-28 (×6): qty 2

## 2020-11-28 MED ORDER — ACETAMINOPHEN 10 MG/ML IV SOLN
1000.0000 mg | Freq: Four times a day (QID) | INTRAVENOUS | Status: AC | PRN
  Administered 2020-11-28 (×2): 1000 mg via INTRAVENOUS
  Filled 2020-11-28 (×2): qty 100

## 2020-11-28 MED ORDER — DEXMEDETOMIDINE (PRECEDEX) IN NS 20 MCG/5ML (4 MCG/ML) IV SYRINGE
PREFILLED_SYRINGE | INTRAVENOUS | Status: DC | PRN
  Administered 2020-11-28: 4 ug via INTRAVENOUS
  Administered 2020-11-28: 8 ug via INTRAVENOUS
  Administered 2020-11-28: 12 ug via INTRAVENOUS

## 2020-11-28 MED ORDER — FENTANYL CITRATE (PF) 100 MCG/2ML IJ SOLN
25.0000 ug | INTRAMUSCULAR | Status: DC | PRN
  Administered 2020-11-28 (×3): 25 ug via INTRAVENOUS

## 2020-11-28 SURGICAL SUPPLY — 38 items
BLADE SURG 15 STRL LF DISP TIS (BLADE) ×2 IMPLANT
BLADE SURG 15 STRL SS (BLADE) ×1
BLADE SURG SZ11 CARB STEEL (BLADE) ×3 IMPLANT
CORD BIP STRL DISP 12FT (MISCELLANEOUS) IMPLANT
DEFOGGER ANTIFOG KIT (MISCELLANEOUS) ×3 IMPLANT
DRAPE MAG INST 16X20 L/F (DRAPES) ×3 IMPLANT
ELECT CAUTERY BLADE TIP 2.5 (TIP) ×3
ELECT REM PT RETURN 9FT ADLT (ELECTROSURGICAL) ×3
ELECTRODE CAUTERY BLDE TIP 2.5 (TIP) ×2 IMPLANT
ELECTRODE REM PT RTRN 9FT ADLT (ELECTROSURGICAL) ×2 IMPLANT
FORCEPS JEWEL BIP 4-3/4 STR (INSTRUMENTS) IMPLANT
GAUZE 4X4 16PLY ~~LOC~~+RFID DBL (SPONGE) ×3 IMPLANT
GLOVE SURG ENC MOIS LTX SZ7.5 (GLOVE) ×3 IMPLANT
GOWN STRL REUS W/ TWL LRG LVL3 (GOWN DISPOSABLE) ×6 IMPLANT
GOWN STRL REUS W/TWL LRG LVL3 (GOWN DISPOSABLE) ×3
HLDR TRACH TUBE NECKBAND 18 (MISCELLANEOUS) ×2 IMPLANT
HOLDER TRACH TUBE NECKBAND 18 (MISCELLANEOUS) ×1
LABEL OR SOLS (LABEL) ×3 IMPLANT
MANIFOLD NEPTUNE II (INSTRUMENTS) ×3 IMPLANT
NS IRRIG 500ML POUR BTL (IV SOLUTION) ×3 IMPLANT
PACK HEAD/NECK (MISCELLANEOUS) ×3 IMPLANT
SHEARS HARMONIC 9CM CVD (BLADE) ×3 IMPLANT
SPONGE DRAIN TRACH 4X4 STRL 2S (GAUZE/BANDAGES/DRESSINGS) ×3 IMPLANT
SPONGE KITTNER 5P (MISCELLANEOUS) ×3 IMPLANT
SPONGE VERSALON 4X4 4PLY (MISCELLANEOUS) ×3 IMPLANT
SUCTION FRAZIER HANDLE 10FR (MISCELLANEOUS) ×1
SUCTION TUBE FRAZIER 10FR DISP (MISCELLANEOUS) ×2 IMPLANT
SUT ETHILON 2 0 FS 18 (SUTURE) ×3 IMPLANT
SUT SILK 2 0 (SUTURE)
SUT SILK 2 0 SH (SUTURE) ×3 IMPLANT
SUT SILK 2-0 18XBRD TIE 12 (SUTURE) IMPLANT
SUT VIC AB 3-0 PS2 18 (SUTURE) ×3 IMPLANT
SUT VIC AB 4-0 PS2 27 (SUTURE) ×3 IMPLANT
SYR 3ML LL SCALE MARK (SYRINGE) ×3 IMPLANT
TUBE TRACH  6.0 CUFF FLEX (MISCELLANEOUS) ×1
TUBE TRACH 6.0 CUFF FLEX (MISCELLANEOUS) ×2 IMPLANT
TUBE TRACH FLEX 8.0 CUFF (MISCELLANEOUS)
TUBE TRACH FLEX 8.5 CUFF (MISCELLANEOUS) IMPLANT

## 2020-11-28 NOTE — Transfer of Care (Signed)
Immediate Anesthesia Transfer of Care Note  Patient: Scott Harding  Procedure(s) Performed: AWAKE TRACHEOSTOMY (Neck) DIRECT LARYNGOSCOPY WITH BIOPSY (Throat)  Patient Location: PACU  Anesthesia Type:General  Level of Consciousness: awake, alert  and oriented  Airway & Oxygen Therapy: Patient Spontanous Breathing  Post-op Assessment: Report given to RN and Post -op Vital signs reviewed and stable  Post vital signs: Reviewed and stable  Last Vitals:  Vitals Value Taken Time  BP 74/66 11/28/20 1345  Temp    Pulse 74 11/28/20 1347  Resp 13 11/28/20 1347  SpO2 100 % 11/28/20 1347  Vitals shown include unvalidated device data.  Last Pain:  Vitals:   11/28/20 0955  TempSrc: Oral  PainSc: 3          Complications: No notable events documented.

## 2020-11-28 NOTE — H&P (Signed)
The patient's history has been reviewed, patient examined, no change in status, stable for surgery.  Questions were answered to the patients satisfaction.  

## 2020-11-28 NOTE — Op Note (Signed)
11/28/2020  1:35 PM    [name]  [id number]   Pre-Op Dx: Left supraglottic mass and airway obstruction  Post-op Dx: SAME  Proc: Emergent tracheotomy under local anesthesia; direct laryngoscopy with biopsy left supraglottic mass  Surg:  Roena Malady  Assistant: Vaught  Anes:  GOT  EBL: Less than 10 cc  Comp: None  Findings: Normal anterior neck anatomy, large exophytic left supraglottic mass involving the left area epiglottic fold extending down to the arytenoid, hypertrophy and edema of the epiglottis, the true vocal folds could not be visualized due to size of the mass.  Procedure: Mr. Pinedo was identified in the holding area and taken to the operating room and placed in supine position.  The patient was draped in usual fashion for neck surgery the neck was prepped sterilely.  With the patient awake a local anesthetic 1% lidocaine with 1 1000's epinephrine used to inject overlying a mark approximately 2 fingerbreadths above the sternal notch.  A total of 10 to 12 cc of anesthetic were used.  With neck prepped and draped sterilely 15 blade was used to incise down to and through the platysma muscle.  Hemostasis achieved using the Bovie cautery.  The strap muscles were identified in the midline and gently divided using harmonic scalpel.  The cricoid cartilage was palpated.  More local anesthetic was used to inject in the pretracheal area a total of 4 cc was added.  The Bovie was used to dissect down on the cricoid cartilage and a hemostat was used to get a pretracheal plane opened.  The harmonic was then used to open the plane which included the isthmus of the thyroid gland.  The thyroid gland retracted on each side hemostasis achieved using the Bovie cautery and the microbipolar.  Cricoid hook was then placed and the trachea was elevated superiorly.  Tracheal an incision was made between the second and third tracheal rings into the tracheal lumen.  Curved scissors were then used to  performed an inverted U-shaped flap in the anterior wall the trachea.  A 4-0 Vicryl suture was then affixed to the inferior skin to form a Bjork flap.  With the airway open and #6 cuffed Shiley tracheostomy tube was placed within the lumen.  The balloon was inflated this was then turned over anesthesia where he was anesthetized.  The trach tube was affixed in position using interrupted 2-0 silk and a soft trach tie.  With the tracheotomy tube secured the table is turned 90 degrees.  A tooth guard was then placed.  A Dedo laryngoscope was then introduced into the airway.  Patient had a relatively small retrognathic jaw which made visualization difficult however the tongue base was examined the epiglottis was found to be pushed to the right was thickened and hypertrophied.   a large exophytic mass was identified appeared to be emanating from the left aryepiglottic fold and arytenoid region.  Multiple biopsies were taken of this mass they were sent for pathology and for culture.  I made several attempts to visualize the larynx but due to the hypertrophy of the epiglottis and the large nature of the mass I could not visualize the true vocal folds.  With minimal bleeding the patient was returned to anesthesia where he was awakened in the operating room he will be taken to the intensive care unit for overnight observation.  He should not require prolonged ventilation.  Dispo:   Good  Plan: Will be admitted to the intensive care unit overnight followed  by the hospitalist, I will also contact his oncology physician and we will await the pathology and the culture results of his biopsy.  Roena Malady  11/28/2020 1:35 PM

## 2020-11-28 NOTE — Anesthesia Preprocedure Evaluation (Signed)
Anesthesia Evaluation  Patient identified by MRN, date of birth, ID band Patient awake    Reviewed: Allergy & Precautions, NPO status , Patient's Chart, lab work & pertinent test results  History of Anesthesia Complications Negative for: history of anesthetic complications  Airway Mallampati: III  TM Distance: >3 FB Neck ROM: Full   Comment: CT scan 11/27/20: Bulky soft tissue thickening involving larynx, supraglottic larynx, and hypopharynx suspicious for malignancy. Probable mild subglottic extension. Stable appearance of laryngeal cartilages that do not appear involved. Airway is patent but narrowed. Dental no notable dental hx.    Pulmonary neg sleep apnea, neg COPD, former smoker,    breath sounds clear to auscultation- rhonchi (-) wheezing      Cardiovascular Exercise Tolerance: Good hypertension, Pt. on medications + CAD and + CABG  (-) Cardiac Stents  Rhythm:Regular Rate:Normal - Systolic murmurs and - Diastolic murmurs    Neuro/Psych neg Seizures negative neurological ROS  negative psych ROS   GI/Hepatic negative GI ROS, Neg liver ROS,   Endo/Other  negative endocrine ROSneg diabetes  Renal/GU Renal InsufficiencyRenal disease     Musculoskeletal negative musculoskeletal ROS (+)   Abdominal (+) + obese,   Peds  Hematology negative hematology ROS (+)   Anesthesia Other Findings Past Medical History: No date: Cancer ([place]) No date: Coronary artery disease No date: Hypertension   Reproductive/Obstetrics                             Anesthesia Physical Anesthesia Plan  ASA: 3  Anesthesia Plan: MAC   Post-op Pain Management:    Induction: Intravenous  PONV Risk Score and Plan: 1 and Midazolam and Ondansetron  Airway Management Planned: Natural Airway and Tracheostomy  Additional Equipment:   Intra-op Plan:   Post-operative Plan:   Informed Consent: I have reviewed  the patients History and Physical, chart, labs and discussed the procedure including the risks, benefits and alternatives for the proposed anesthesia with the patient or authorized representative who has indicated his/her understanding and acceptance.       Plan Discussed with: CRNA and Anesthesiologist  Anesthesia Plan Comments: (Plan for MAC for tracheostomy placement, discussed deeper anesthesia following tube placement if needed.)        Anesthesia Quick Evaluation

## 2020-11-28 NOTE — H&P (Addendum)
Scott Harding is an 65 y.o. male.   Chief Complaint: Left supraglottic mass with airway obstruction status post tracheostomy  HPI:  Patient is a 65 year old former smoker (quit 12 years ago) who presented for emergent management of upper airway obstruction by a left supraglottic mass.  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  He was last seen by radiation oncology and medical oncology around February 2022.  Most recent PET/CT scan was in July 2021 no evidence of current or metastatic disease.  Approximately 3 months ago he developed recurrent left throat pain.  It was refractory to management with OTC medications, prednisone taper and Z-Pak.  He saw ENT on June and underwent laryngoscopy at that time and a CT scan of the tissues of the neck was requested.  The CT scan was performed yesterday and showed tissue thickening of the larynx, supraglottic larynx and hypopharynx suspicious for malignancy as well as possible subglottic extension.  Airway was patent but narrowed.  The patient was brought today for emergent awake tracheostomy given the airway narrowing.  He has undergone this process.  It is requested the patient be served in stepdown unit post tracheostomy.  In the OR under the direct examination the patient was noted to have a large exophytic left supraglottic mass involving the left area epiglottic fold extending down to the arytenoid, hypertrophy and edema of the epiglottis, the true vocal folds could not be visualized due to size of the mass.  The patient underwent placement of a #6 trach.  PCCM was asked to admit the patient to stepdown.  Patient is currently not requiring mechanical ventilation and is hemodynamically stable.  Unable to communicate completely with patient due to status post tracheostomy however he does write down answers.  Past Medical History:  Diagnosis Date   Cancer [facility])     Coronary artery disease    Hypertension     Past Surgical History:  Procedure Laterality Date   CORONARY ARTERY BYPASS GRAFT  2010   Quadruple    Family History  Problem Relation Age of Onset   Arthritis Mother    Heart attack Father    Brain cancer Sister    Social History:  Social History   Tobacco Use   Smoking status: Former    Types: Cigarettes    Quit date: 10/17/2008    Years since quitting: 12.1   Smokeless tobacco: Never  Substance Use Topics   Alcohol use: Yes    Comment: occasionally    Allergies: No Known Allergies  Medications Prior to Admission  Medication Sig Dispense Refill   amLODipine (NORVASC) 10 MG tablet      ascorbic acid (VITAMIN C) 1000 MG tablet Take 1 tablet by mouth.     aspirin 81 MG chewable tablet Chew 81 mg by mouth daily.     DENTA 5000 PLUS 1.1 % CREA dental cream BRUSH WITH PASTE 2 3 TIMES PER DAY FOR AT LEAST 1 MINUTE     hydrALAZINE (APRESOLINE) 50 MG tablet Take 50 mg by mouth 2 (two) times daily.     metoprolol succinate (TOPROL-XL) 25 MG 24 hr tablet Take 1 tablet by mouth 1 day or 1 dose.     olmesartan (BENICAR) 40 MG tablet Take 40 mg by mouth daily.     rosuvastatin (CRESTOR) 40 MG tablet Take 40 mg by mouth daily.     aspirin 325 MG tablet Take  325 mg by mouth daily.     ondansetron (ZOFRAN) 8 MG tablet Take 1 tablet (8 mg total) by mouth 2 (two) times daily as needed. (Patient not taking: Reported on 11/19/2020) 30 tablet 1   prochlorperazine (COMPAZINE) 10 MG tablet Take 1 tablet (10 mg total) by mouth every 6 (six) hours as needed (Nausea or vomiting). (Patient not taking: Reported on 11/19/2020) 30 tablet 1    Results for orders placed or performed during the hospital encounter of 11/28/20 (from the past 48 hour(s))  SARS Coronavirus 2 by RT PCR (hospital order, performed in [facility] hospital lab) Nasopharyngeal Nasopharyngeal Swab     Status: None   Collection Time: 11/28/20  9:39 AM   Specimen: Nasopharyngeal Swab   Result Value Ref Range   SARS Coronavirus 2 NEGATIVE NEGATIVE    Comment: (NOTE) SARS-CoV-2 target nucleic acids are NOT DETECTED.  The SARS-CoV-2 RNA is generally detectable in upper and lower respiratory specimens during the acute phase of infection. The lowest concentration of SARS-CoV-2 viral copies this assay can detect is 250 copies / mL. A negative result does not preclude SARS-CoV-2 infection and should not be used as the sole basis for treatment or other patient management decisions.  A negative result may occur with improper specimen collection / handling, submission of specimen other than nasopharyngeal swab, presence of viral mutation(s) within the areas targeted by this assay, and inadequate number of viral copies (<250 copies / mL). A negative result must be combined with clinical observations, patient history, and epidemiological information.  Fact Sheet for Patients:   StrictlyIdeas.no  Fact Sheet for Healthcare Providers: BankingDealers.co.za  This test is not yet approved or  cleared by the [place] FDA and has been authorized for detection and/or diagnosis of SARS-CoV-2 by FDA under an Emergency Use Authorization (EUA).  This EUA will remain in effect (meaning this test can be used) for the duration of the COVID-19 declaration under Section 564(b)(1) of the Act, 21 U.S.C. section 360bbb-3(b)(1), unless the authorization is terminated or revoked sooner.  Performed at [facility], [address] [place], [place] [address]   CBC with Differential/Platelet     Status: Abnormal   Collection Time: 11/28/20 10:15 AM  Result Value Ref Range   WBC 10.4 4.0 - 10.5 K/uL   RBC 3.86 (L) 4.22 - 5.81 MIL/uL   Hemoglobin 12.5 (L) 13.0 - 17.0 g/dL   HCT 36.3 (L) 39.0 - 52.0 %   MCV 94.0 80.0 - 100.0 fL   MCH 32.4 26.0 - 34.0 pg   MCHC 34.4 30.0 - 36.0 g/dL   RDW 13.7 11.5 - 15.5 %   Platelets 228 150 - 400  K/uL   nRBC 0.0 0.0 - 0.2 %   Neutrophils Relative % 93 %   Neutro Abs 9.6 (H) 1.7 - 7.7 K/uL   Lymphocytes Relative 4 %   Lymphs Abs 0.5 (L) 0.7 - 4.0 K/uL   Monocytes Relative 3 %   Monocytes Absolute 0.3 0.1 - 1.0 K/uL   Eosinophils Relative 0 %   Eosinophils Absolute 0.0 0.0 - 0.5 K/uL   Basophils Relative 0 %   Basophils Absolute 0.0 0.0 - 0.1 K/uL   Immature Granulocytes 0 %   Abs Immature Granulocytes 0.03 0.00 - 0.07 K/uL    Comment: Performed at [facility], [address] [place], [place] [address]  Comprehensive metabolic panel     Status: Abnormal   Collection Time: 11/28/20 10:15 AM  Result  Value Ref Range   Sodium 140 135 - 145 mmol/L   Potassium 3.7 3.5 - 5.1 mmol/L   Chloride 105 98 - 111 mmol/L   CO2 22 22 - 32 mmol/L   Glucose, Bld 132 (H) 70 - 99 mg/dL    Comment: Glucose reference range applies only to samples taken after fasting for at least 8 hours.   BUN 24 (H) 8 - 23 mg/dL   Creatinine, Ser 1.92 (H) 0.61 - 1.24 mg/dL   Calcium 9.7 8.9 - 10.3 mg/dL   Total Protein 7.8 6.5 - 8.1 g/dL   Albumin 4.6 3.5 - 5.0 g/dL   AST 16 15 - 41 U/L   ALT 13 0 - 44 U/L   Alkaline Phosphatase 38 38 - 126 U/L   Total Bilirubin 0.7 0.3 - 1.2 mg/dL   GFR, Estimated 38 (L) >60 mL/min    Comment: (NOTE) Calculated using the CKD-EPI Creatinine Equation (2021)    Anion gap 13 5 - 15    Comment: Performed at [facility], [address] [place], [place] [address]   CT SOFT TISSUE NECK W CONTRAST  Result Date: 11/27/2020 CLINICAL DATA:  Laryngeal mass; history of left tonsillar cancer EXAM: CT NECK WITH CONTRAST TECHNIQUE: Multidetector CT imaging of the neck was performed using the standard protocol following the bolus administration of intravenous contrast. CONTRAST:  53mL OMNIPAQUE IOHEXOL 350 MG/ML SOLN COMPARISON:  CT neck 08/01/2019 FINDINGS: Pharynx and larynx: Nasopharynx is unremarkable. Oropharynx unremarkable without evidence of recurrent  disease. There is thickening of the epiglottis. Additional soft tissue thickening involving the supraglottic larynx, hypopharynx, and larynx bilaterally. There is probable mild subglottic extension. Laryngeal cartilages are similar in appearance. Due to the bulky exophytic nature of the soft tissue, airway is patent but narrowed. Salivary glands: Stable appearance of parotid and submandibular glands. Thyroid: Unremarkable. Lymph nodes: No substantial change in left level 2 node with some calcification measuring 1.8 cm on series 2, image 56. There are additional nonenlarged level 2 nodes that have increased in size compared to the prior study. Enlarged right level 5 and 3 nodes on series 2, image 73. The level 5 node measures 1.6 cm and the level 3 node measures 1.6 cm. Nonenlarged but asymmetric right supraclavicular node on series 2, image 90. Nonenlarged left level 2 node on series 2, image 59 has increased in size. Vascular: Major neck vessels are patent. Calcified plaque at the ICA origins, left greater than right. Limited intracranial: No abnormal enhancement. Visualized orbits: Unremarkable. Mastoids and visualized paranasal sinuses: Mild mucosal thickening. Mastoid air cells are clear. Skeleton: Degenerative changes of the cervical spine. Upper chest: No apical lung mass. Other: None. IMPRESSION: Bulky soft tissue thickening involving larynx, supraglottic larynx, and hypopharynx suspicious for malignancy. Probable mild subglottic extension. Stable appearance of laryngeal cartilages that do not appear involved. Airway is patent but narrowed. Enlarged right level 3 and 5 nodes. Additional nonenlarged but indeterminate right level 2 and supraclavicular nodes and left level 2 node. Stable left level 2 node with mild calcification. No evidence of oropharyngeal recurrent disease. These results will be called to the ordering clinician or representative by the Radiologist Assistant, and communication documented in  the PACS or [employer]. Electronically Signed   By: Macy Mis M.D.   On: 11/27/2020 16:42    Review of Systems Review of systems was performed as able, a 10 point review of systems was performed and it is as noted above otherwise negative.  Blood pressure  101/64, pulse 79, temperature (!) 97 F (36.1 C), resp. rate 16, height 5\' 9"  (1.753 m), weight 97.5 kg, SpO2 99 %. Physical Exam  GENERAL: Overweight gentleman, no acute distress he is awake and alert, writing answers on a tablet. HEAD: Normocephalic, atraumatic.  EYES: Pupils equal, round, reactive to light.  No scleral icterus.  MOUTH: Oral mucosa moist.  No thrush. NECK: Thick, Supple.  #6 tracheostomy in place. No JVD.  No adenopathy. PULMONARY: Good air entry bilaterally.  No adventitious sounds. CARDIOVASCULAR: S1 and S2. Regular rate and rhythm.  No rubs, murmurs or gallops heard. ABDOMEN: Obese, otherwise benign. MUSCULOSKELETAL: No joint deformity, no clubbing, no edema.  NEUROLOGIC: No focal deficits noted.  Awake and alert, interactive. SKIN: Intact,warm,dry. PSYCH: Appears calm and cooperative.  Assessment/Plan  Left supraglottic mass with airway obstruction Status post tracheostomy Admit to stepdown Close monitoring of ventilatory status overnight N.P.O. until swallow eval done by SLP in the morning Bronchodilators for mucociliary clearance Trach collar with humidity Suction as needed per RT  Stage III squamous cell carcinoma of the left tonsillar pillar Status post chemoradiation Suspect recurrence Await biopsy specimens  Hypertension Currently normotensive Monitor Hold outpatient meds for now  Cardiomegaly LVEF 40% in 2017 Status post CABG x4 in the past 2D echo to reassess LVEF Cardiac monitoring   Chronic kidney disease stage IV No evidence of decompensation Monitor BMP Ensure renal perfusion Supplement with IV fluids until able to take p.o.'s  Prophylaxis No chemical prophylaxis  as just post tracheostomy SCDs No indication for GI prophylaxis   C. Derrill Kay, MD Advanced Bronchoscopy PCCM [facility]  11/28/2020, 2:15 PM  *This note was dictated using voice recognition software/Dragon.  Despite best efforts to proofread, errors can occur which can change the meaning.  Any change was purely unintentional.

## 2020-11-28 NOTE — Anesthesia Procedure Notes (Signed)
Date/Time: 11/28/2020 12:30 PM Performed by: Hedda Slade, CRNA Pre-anesthesia Checklist: Patient identified, Emergency Drugs available, Suction available and Patient being monitored Patient Re-evaluated:Patient Re-evaluated prior to induction Oxygen Delivery Method: Simple face mask

## 2020-11-29 ENCOUNTER — Inpatient Hospital Stay (HOSPITAL_COMMUNITY)
Admission: RE | Admit: 2020-11-29 | Discharge: 2020-11-29 | Disposition: A | Discharge status: Home / Self Care | Payer: 59 | Source: Home / Self Care | Attending: Pulmonary Disease | Admitting: Pulmonary Disease

## 2020-11-29 DIAGNOSIS — N184 Chronic kidney disease, stage 4 (severe): Secondary | ICD-10-CM

## 2020-11-29 DIAGNOSIS — I10 Essential (primary) hypertension: Secondary | ICD-10-CM

## 2020-11-29 DIAGNOSIS — C099 Malignant neoplasm of tonsil, unspecified: Secondary | ICD-10-CM

## 2020-11-29 DIAGNOSIS — I428 Other cardiomyopathies: Secondary | ICD-10-CM

## 2020-11-29 LAB — HIV ANTIBODY (ROUTINE TESTING W REFLEX): HIV Screen 4th Generation wRfx: NONREACTIVE

## 2020-11-29 LAB — CBC
HCT: 34.4 % — ABNORMAL LOW (ref 39.0–52.0)
Hemoglobin: 11.4 g/dL — ABNORMAL LOW (ref 13.0–17.0)
MCH: 31.9 pg (ref 26.0–34.0)
MCHC: 33.1 g/dL (ref 30.0–36.0)
MCV: 96.4 fL (ref 80.0–100.0)
Platelets: 203 10*3/uL (ref 150–400)
RBC: 3.57 MIL/uL — ABNORMAL LOW (ref 4.22–5.81)
RDW: 13.9 % (ref 11.5–15.5)
WBC: 13.5 10*3/uL — ABNORMAL HIGH (ref 4.0–10.5)
nRBC: 0 % (ref 0.0–0.2)

## 2020-11-29 LAB — ECHOCARDIOGRAM COMPLETE
AR max vel: 1.65 cm2
AV Area VTI: 1.68 cm2
AV Area mean vel: 1.8 cm2
AV Mean grad: 5 mmHg
AV Peak grad: 9.5 mmHg
Ao pk vel: 1.54 m/s
Area-P 1/2: 3.53 cm2
Calc EF: 49.3 %
Height: 69 in
S' Lateral: 3.79 cm
Single Plane A2C EF: 45.5 %
Single Plane A4C EF: 49.7 %
Weight: 3301.61 oz

## 2020-11-29 LAB — PHOSPHORUS: Phosphorus: 2.6 mg/dL (ref 2.5–4.6)

## 2020-11-29 LAB — MAGNESIUM: Magnesium: 2.1 mg/dL (ref 1.7–2.4)

## 2020-11-29 LAB — BASIC METABOLIC PANEL
Anion gap: 9 (ref 5–15)
BUN: 28 mg/dL — ABNORMAL HIGH (ref 8–23)
CO2: 23 mmol/L (ref 22–32)
Calcium: 8.9 mg/dL (ref 8.9–10.3)
Chloride: 108 mmol/L (ref 98–111)
Creatinine, Ser: 1.75 mg/dL — ABNORMAL HIGH (ref 0.61–1.24)
GFR, Estimated: 43 mL/min — ABNORMAL LOW (ref >60)
Glucose, Bld: 155 mg/dL — ABNORMAL HIGH (ref 70–99)
Potassium: 3.7 mmol/L (ref 3.5–5.1)
Sodium: 140 mmol/L (ref 135–145)

## 2020-11-29 LAB — GLUCOSE, CAPILLARY: Glucose-Capillary: 124 mg/dL — ABNORMAL HIGH (ref 70–99)

## 2020-11-29 MED ORDER — ORAL CARE MOUTH RINSE
15.0000 mL | Freq: Two times a day (BID) | OROMUCOSAL | Status: DC
  Administered 2020-11-29 – 2020-12-01 (×5): 15 mL via OROMUCOSAL

## 2020-11-29 MED ORDER — SODIUM CHLORIDE 0.9 % IV SOLN
1.0000 g | Freq: Every day | INTRAVENOUS | Status: DC
  Administered 2020-11-29 – 2020-11-30 (×2): 1 g via INTRAVENOUS
  Filled 2020-11-29 (×2): qty 1
  Filled 2020-11-29: qty 10

## 2020-11-29 MED ORDER — CHLORHEXIDINE GLUCONATE 0.12 % MT SOLN
15.0000 mL | Freq: Two times a day (BID) | OROMUCOSAL | Status: DC
  Administered 2020-11-29 – 2020-12-02 (×7): 15 mL via OROMUCOSAL
  Filled 2020-11-29 (×4): qty 15

## 2020-11-29 NOTE — TOC Progression Note (Signed)
Transition of Care [facility]) - Progression Note    Patient Details  Name: Scott Harding MRN: [id number] Date of Birth: 07-06-1955  [facility] CM/SW Contact  Anselm Pancoast, RN Phone Number: 11/29/2020, 11:23 AM  Clinical Narrative:    Damaris Schooner with Maurine Simmering on call who reports he can provide family/patient with information on advance directives however they are not able to complete them on weekends.    Expected Discharge Plan: [place] Barriers to Discharge: No Barriers Identified  Expected Discharge Plan and Services Expected Discharge Plan: [place] arrangements for the past 2 months: Single Family Home                                       Social Determinants of Health (SDOH) Interventions    Readmission Risk Interventions No flowsheet data found.

## 2020-11-29 NOTE — Progress Notes (Signed)
..11/29/2020 9:31 AM  Scott Harding [id number]  Post-Op Day 1    Temp:  [97 F (36.1 C)-98.6 F (37 C)] 98.6 F (37 C) (08/13 0831) Pulse Rate:  [52-98] 92 (08/13 0831) Resp:  [10-35] 35 (08/13 0831) BP: (74-146)/(54-95) 146/72 (08/13 0800) SpO2:  [92 %-100 %] 92 % (08/13 0831) FiO2 (%):  [28 %-40 %] 28 % (08/13 0809) Weight:  [93.6 kg-97.5 kg] 93.6 kg (08/12 1622),     Intake/Output Summary (Last 24 hours) at 11/29/2020 0931 Last data filed at 11/28/2020 1537 Gross per 24 hour  Intake 400 ml  Output --  Net 400 ml    Results for orders placed or performed during the hospital encounter of 11/28/20 (from the past 24 hour(s))  SARS Coronavirus 2 by RT PCR (hospital order, performed in [place] hospital lab) Nasopharyngeal Nasopharyngeal Swab     Status: None   Collection Time: 11/28/20  9:39 AM   Specimen: Nasopharyngeal Swab  Result Value Ref Range   SARS Coronavirus 2 NEGATIVE NEGATIVE  CBC with Differential/Platelet     Status: Abnormal   Collection Time: 11/28/20 10:15 AM  Result Value Ref Range   WBC 10.4 4.0 - 10.5 K/uL   RBC 3.86 (L) 4.22 - 5.81 MIL/uL   Hemoglobin 12.5 (L) 13.0 - 17.0 g/dL   HCT 36.3 (L) 39.0 - 52.0 %   MCV 94.0 80.0 - 100.0 fL   MCH 32.4 26.0 - 34.0 pg   MCHC 34.4 30.0 - 36.0 g/dL   RDW 13.7 11.5 - 15.5 %   Platelets 228 150 - 400 K/uL   nRBC 0.0 0.0 - 0.2 %   Neutrophils Relative % 93 %   Neutro Abs 9.6 (H) 1.7 - 7.7 K/uL   Lymphocytes Relative 4 %   Lymphs Abs 0.5 (L) 0.7 - 4.0 K/uL   Monocytes Relative 3 %   Monocytes Absolute 0.3 0.1 - 1.0 K/uL   Eosinophils Relative 0 %   Eosinophils Absolute 0.0 0.0 - 0.5 K/uL   Basophils Relative 0 %   Basophils Absolute 0.0 0.0 - 0.1 K/uL   Immature Granulocytes 0 %   Abs Immature Granulocytes 0.03 0.00 - 0.07 K/uL  Comprehensive metabolic panel     Status: Abnormal   Collection Time: 11/28/20 10:15 AM  Result Value Ref Range   Sodium 140 135 - 145 mmol/L   Potassium 3.7 3.5 - 5.1 mmol/L    Chloride 105 98 - 111 mmol/L   CO2 22 22 - 32 mmol/L   Glucose, Bld 132 (H) 70 - 99 mg/dL   BUN 24 (H) 8 - 23 mg/dL   Creatinine, Ser 1.92 (H) 0.61 - 1.24 mg/dL   Calcium 9.7 8.9 - 10.3 mg/dL   Total Protein 7.8 6.5 - 8.1 g/dL   Albumin 4.6 3.5 - 5.0 g/dL   AST 16 15 - 41 U/L   ALT 13 0 - 44 U/L   Alkaline Phosphatase 38 38 - 126 U/L   Total Bilirubin 0.7 0.3 - 1.2 mg/dL   GFR, Estimated 38 (L) >60 mL/min   Anion gap 13 5 - 15  Aerobic/Anaerobic Culture w Gram Stain (surgical/deep wound)     Status: None (Preliminary result)   Collection Time: 11/28/20  1:53 PM   Specimen: Soft Tissue, Other  Result Value Ref Range   Specimen Description      WOUND Performed at [facility], [address] [place], [place] [address]    Special Requests LEFT SUPRAGLOTTIC MASS  Gram Stain      RARE WBC PRESENT,BOTH PMN AND MONONUCLEAR FEW GRAM POSITIVE COCCI IN PAIRS Performed at [facility], [place] [address] [place], [place] [address]    Culture PENDING    Report Status PENDING   MRSA Next Gen by PCR, Nasal     Status: None   Collection Time: 11/28/20  4:20 PM   Specimen: Nasal Mucosa; Nasal Swab  Result Value Ref Range   MRSA by PCR Next Gen NOT DETECTED NOT DETECTED  Glucose, capillary     Status: Abnormal   Collection Time: 11/28/20 11:56 PM  Result Value Ref Range   Glucose-Capillary 168 (H) 70 - 99 mg/dL  CBC     Status: Abnormal   Collection Time: 11/29/20  4:26 AM  Result Value Ref Range   WBC 13.5 (H) 4.0 - 10.5 K/uL   RBC 3.57 (L) 4.22 - 5.81 MIL/uL   Hemoglobin 11.4 (L) 13.0 - 17.0 g/dL   HCT 34.4 (L) 39.0 - 52.0 %   MCV 96.4 80.0 - 100.0 fL   MCH 31.9 26.0 - 34.0 pg   MCHC 33.1 30.0 - 36.0 g/dL   RDW 13.9 11.5 - 15.5 %   Platelets 203 150 - 400 K/uL   nRBC 0.0 0.0 - 0.2 %  Basic metabolic panel     Status: Abnormal   Collection Time: 11/29/20  4:26 AM  Result Value Ref Range   Sodium 140 135 - 145 mmol/L   Potassium 3.7 3.5 - 5.1 mmol/L    Chloride 108 98 - 111 mmol/L   CO2 23 22 - 32 mmol/L   Glucose, Bld 155 (H) 70 - 99 mg/dL   BUN 28 (H) 8 - 23 mg/dL   Creatinine, Ser 1.75 (H) 0.61 - 1.24 mg/dL   Calcium 8.9 8.9 - 10.3 mg/dL   GFR, Estimated 43 (L) >60 mL/min   Anion gap 9 5 - 15  Magnesium     Status: None   Collection Time: 11/29/20  4:26 AM  Result Value Ref Range   Magnesium 2.1 1.7 - 2.4 mg/dL  Phosphorus     Status: None   Collection Time: 11/29/20  4:26 AM  Result Value Ref Range   Phosphorus 2.6 2.5 - 4.6 mg/dL    SUBJECTIVE:  No acute events.  Slept some.  Reports pain controlled with medications.  Still NPO.  Voiding at bed.  OBJECTIVE:  GEN- NAD, supine in bed NECK- size 6 trach secure and patent with balloon deflated.  Mild secretions.  Tracheostomy dressing changed with no bleeding.  IMPRESSION:  s/p tracheostomy tube for airway control with history of SCCA of left tonsil with severe supraglottic soft tissue swelling  PLAN:  Preliminary culture report shows gram + cocci in pairs and discussed with ICU and they will potentially start abx coverage.  Pathology pending and rest of biopsies.  Speech at bedside today to determine swallowing risk and PMV suitability.  Begin social work/home health planning on Monday.  Anticipate discharge next week once home health has been set up and most likely trach changed to uncuffed.  Will continue to follow.  Scott Harding 11/29/2020, 9:31 AM

## 2020-11-29 NOTE — TOC Initial Note (Addendum)
Transition of Care [facility]) - Initial/Assessment Note    Patient Details  Name: Scott Harding MRN: [id number] Date of Birth: 1955/05/10  [facility] of [facility] CM/SW Contact:    Anselm Pancoast, RN Phone Number: 11/29/2020, 10:58 AM  Clinical Narrative:                 Spoke with daughter, April who is primary caregiver. Patient was independent prior to admission other than consistent difficulty with swallowing/breathing at times. Abel to drive himself to and from appointments. Daughter is willing/able to assist patient as needed. April requesting assistance with getting trach supplies and possible hhc set up at discharge. Daughter also requesting assistance with advance directives.   Paged on call Chaplain to assist with advance directives.   Expected Discharge Plan: [place] Barriers to Discharge: No Barriers Identified   Patient Goals and CMS Choice Patient states their goals for this hospitalization and ongoing recovery are:: Get home and feeling better      Expected Discharge Plan and Services Expected Discharge Plan: [place]       Living arrangements for the past 2 months: Single Family Home                                      Prior Living Arrangements/Services Living arrangements for the past 2 months: Single Family Home Lives with:: Self Patient language and need for interpreter reviewed:: Yes Do you feel safe going back to the place where you live?: Yes      Need for Family Participation in Patient Care: Yes (Comment) Care giver support system in place?: Yes (comment)   Criminal Activity/Legal Involvement Pertinent to Current Situation/Hospitalization: No - Comment as needed  Activities of Daily Living Home Assistive Devices/Equipment: None ADL Screening (condition at time of admission) Patient's cognitive ability adequate to safely complete daily activities?: Yes Is the patient deaf or have difficulty hearing?:  Yes Does the patient have difficulty seeing, even when wearing glasses/contacts?: Yes Does the patient have difficulty concentrating, remembering, or making decisions?: Yes Patient able to express need for assistance with ADLs?: Yes Does the patient have difficulty dressing or bathing?: Yes Independently performs ADLs?: Yes (appropriate for developmental age) Does the patient have difficulty walking or climbing stairs?: Yes Weakness of Legs: None Weakness of Arms/Hands: None  Permission Sought/Granted Permission sought to share information with : Family Supports    Share Information with NAME: Darnelle Catalan, Dtr           Emotional Assessment Appearance:: Appears stated age     Orientation: : Oriented to Self, Oriented to Place, Oriented to  Time, Oriented to Situation Alcohol / Substance Use: Not Applicable Psych Involvement: No (comment)  Admission diagnosis:  Laryngeal mass [J38.7] Patient Active Problem List   Diagnosis Date Noted   Laryngeal mass 11/28/2020   CKD (chronic kidney disease), stage IV ([place]) 11/28/2020   Goals of care, counseling/discussion 10/05/2018   Squamous cell carcinoma of left tonsil ([place]) 09/15/2018   Stroke ([place]) 09/14/2018   Benign essential hypertension 08/05/2014   CAD (coronary artery disease) of bypass graft 01/24/2014   Hyperlipemia, mixed 01/24/2014   Ischemic cardiomyopathy 01/24/2014   Valvular heart disease 01/24/2014   Ischemic optic neuropathy of both eyes 04/18/2012   PCP:  Idelle Crouch, MD Pharmacy:   [facility] [place], [place]  [place] [address] Phone: [phone] Fax: [phone]     Social Determinants of Health (SDOH) Interventions    Readmission Risk Interventions No flowsheet data found.

## 2020-11-29 NOTE — Evaluation (Signed)
[facility] Speaking [facility]ve - Evaluation Patient Details  Name: Scott Harding MRN: [id number] Date of Birth: 12/28/1955  Today's Date: 11/29/2020 Time: 0950-1025 SLP Time Calculation (min) (ACUTE ONLY): 35 min  Past Medical History:  Past Medical History:  Diagnosis Date   Cancer ([place])    Coronary artery disease    Hypertension    Past Surgical History:  Past Surgical History:  Procedure Laterality Date   CORONARY ARTERY BYPASS GRAFT  2010   Quadruple   HPI:  Patient is a 65 year old former smoker (quit 12 years ago) who presented for emergent management of upper airway obstruction by a left supraglottic mass.  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  He was last seen by radiation oncology and medical oncology around February 2022.  Most recent PET/CT scan was in July 2021 no evidence of current or metastatic disease.  Approximately 3 months ago he developed recurrent left throat pain.  It was refractory to management with OTC medications, prednisone taper and Z-Pak.  He saw ENT on June and underwent laryngoscopy at that time and a CT scan of the tissues of the neck was requested.  The CT scan was performed yesterday and showed tissue thickening of the larynx, supraglottic larynx and hypopharynx suspicious for malignancy as well as possible subglottic extension.  Airway was patent but narrowed.  The patient was brought on 11/28/2020 for emergent awake tracheostomy given the airway narrowing.   Assessment / Plan / Recommendation Clinical Impression  Pt is currently POD #1 with size 6 trach secure and patent with balloon deflated.  Mild secretions.  ENT present at beginning of assessment with verbal orders given for PMV assessment. Pt is awake, cognitively intact. PMV placed for a total of ~ 45 minutes with no s/s of respiratory distress. Pt is able to obtain phonation. Prior to trach, pt's speech was  hoarse, breathy and had reduced vocal intensity, suspect d/t supraglottic mass. With PMV in place, pt's speech sounds the same and appears at baseline per pt and his daughter. Pt's overall speech intelligibility is remarkably good with ~ 90% intelligibility at the sentence level. In addition to being able to phonate, pt is able to cough up intermittent secretions into his oral cavity. PMV was in place during consumption of ice chips, thin liquids via straw and puree. Extensive education was provided to pt and his daughter on PMV function and wear times. At this time, recommend pt wear during all waking hours under continuous pulse ox, when drinking with removal while sleeping. SLP Visit Diagnosis: Aphonia (R49.1)    SLP Assessment  Patient needs continued Speech Lanaguage Pathology Services    Follow Up Recommendations   (TBD)    Frequency and Duration min 2x/week  2 weeks    PMSV Trial PMSV was placed for: 45 minutes Able to redirect subglottic air through upper airway: Yes Able to Attain Phonation: Yes Voice Quality: Breathy;Hoarse;Low vocal intensity Able to Expectorate Secretions: Yes Level of Secretion Expectoration with PMSV: Oral Breath Support for Phonation: Mildly decreased Intelligibility: Intelligibility reduced Word: 75-100% accurate Phrase: 75-100% accurate Sentence: 50-74% accurate Conversation: 50-74% accurate Respirations During Trial: 17 SpO2 During Trial: 100 % Pulse During Trial:  (WNL) Behavior: Alert;Controlled;Cooperative;Expresses self well;Good eye contact;Responsive to questions   Tracheostomy Tube  Additional Tracheostomy Tube Assessment Fenestrated: No Trach Collar Period: 24 hours Secretion Description: thin, clear Frequency of Tracheal Suctioning: N/A Level of Secretion Expectoration: Oral  Vent Dependency  FiO2 (%): 28 %    Cuff Deflation Trial  GO       Kamdin Follett B. Rutherford Nail M.S., CCC-SLP, CBIS Speech-Language  Pathologist Rehabilitation Services Office [phone]  Tolerated Cuff Deflation: Yes Length of Time for Cuff Deflation Trial: 24 hours Behavior: Alert;Expresses self well;Good eye contact;Oriented X3;Responsive to questions Cuff Deflation Trial - Comments: NAD        Harold Mattes 11/29/2020, 11:47 AM

## 2020-11-29 NOTE — TOC Progression Note (Signed)
Transition of Care [facility]) - Progression Note    Patient Details  Name: Scott Harding MRN: [id number] Date of Birth: Jul 27, 1955  [facility] CM/SW Contact  Izola Price, RN Phone Number: 11/29/2020, 2:14 PM  Clinical Narrative:  Dani Gobble at [facility] and while his department cannot take this type patient, he gave me a number than does deal with trach patient. [facility] [phone]. Left a VM to call RN CM back. Simmie Davies RN CM      Expected Discharge Plan: [place] Barriers to Discharge: No Barriers Identified  Expected Discharge Plan and Services Expected Discharge Plan: [place] arrangements for the past 2 months: Single Family Home                                       Social Determinants of Health (SDOH) Interventions    Readmission Risk Interventions No flowsheet data found.

## 2020-11-29 NOTE — Progress Notes (Signed)
  Chaplain On-Call responded to a telephone referral from RN Andreas Newport, the Transition of Care Supervisor.  Manuela Schwartz reported that the patient's daughter Estill Bamberg was requesting information about Advance Directives.  Chaplain visited the patient and his daughter and discussed their interest in the AD documents.  Both the patient and his daughter stated that they want to have the documents completed. Chaplain provided education, and advised them that completion of the AD documents for the patient would have to wait until Monday, due to the absence of hospital Volunteers and Notaries on weekends. Chaplain also advised Estill Bamberg that her documents would need to be managed outside the hospital because the hospital Notaries serve only requests by patients.  Both stated their understanding and that they will discuss the documents together.  Chaplain will make a referral for follow up by the Chaplain on Monday.  Chaplain Pollyann Samples M.Div., [facility]

## 2020-11-29 NOTE — Progress Notes (Signed)
PROGRESS NOTE    Scott Harding  WLS:[id number] DOB: 1955/08/04 DOA: 11/28/2020 PCP: Idelle Crouch, MD    Brief Narrative:   Patient is a 65 year old former smoker (quit 12 years ago) who presented for emergent management of upper airway obstruction by a left supraglottic mass.  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  He was last seen by radiation oncology and medical oncology around February 2022.  Most recent PET/CT scan was in July 2021 no evidence of current or metastatic disease.  Approximately 3 months ago he developed recurrent left throat pain.  It was refractory to management with OTC medications, prednisone taper and Z-Pak.  He saw ENT on June and underwent laryngoscopy at that time and a CT scan of the tissues of the neck was requested.  The CT scan was performed yesterday and showed tissue thickening of the larynx, supraglottic larynx and hypopharynx suspicious for malignancy as well as possible subglottic extension.  Airway was patent but narrowed.  The patient was brought today for emergent awake tracheostomy given the airway narrowing.  He has undergone this process.  It is requested the patient be served in stepdown unit post tracheostomy.  Assessment & Plan:   Active Problems:   Benign essential hypertension   Squamous cell carcinoma of left tonsil (HCC)   Laryngeal mass   CKD (chronic kidney disease), stage IV ([place])  #1.  Left laryngeal mass status post tracheostomy. Patient tissue culture came back with gram-positive cocci in pairs, will start Rocephin per recommendation from ENT. Patient is a pending speech therapy evaluation to start a diet. Planning to discharge on Monday per ENT.  2.  Stage III squamous cell carcinoma of the left tonsillar pillar. Status post chemo and radiation therapy.   Patient be followed with oncology as outpatient.  #3.  Chronic systolic  congestive heart failure with ejection fraction 40%. Essential hypertension plan Patient is a pending echocardiogram, currently, patient does not have any evidence of volume overload.  Will discontinue IV fluids.  4.  Chronic kidney disease stage IV. Renal function stable.  DVT prophylaxis: SCDs Code Status: full Family Communication:  Disposition Plan:    Status is: Inpatient  Remains inpatient appropriate because:Inpatient level of care appropriate due to severity of illness  Dispo: The patient is from: Home              Anticipated d/c is to: Home              Patient currently is not medically stable to d/c.   Difficult to place patient No        I/O last 3 completed shifts: In: 400 [I.V.:400] Out: -  No intake/output data recorded.     Consultants:  ENT  Procedures: tracheostomy.  Antimicrobials: Rocephin  Subjective: Patient doing well today, still on oxygen on trach collar.  Does not feel short of breath, no cough. No abdominal pain nausea vomiting. No fever chills  Objective: Vitals:   11/29/20 0800 11/29/20 0831 11/29/20 0900 11/29/20 1115  BP: (!) 146/72  113/66 120/71  Pulse: 60 92 63 68  Resp: 18 (!) 35 15 17  Temp:  98.6 F (37 C)  98.3 F (36.8 C)  TempSrc:  Oral    SpO2: 100% 92% 98% 100%  Weight:      Height:        Intake/Output Summary (Last 24 hours) at 11/29/2020 1135  Last data filed at 11/28/2020 1537 Gross per 24 hour  Intake 400 ml  Output --  Net 400 ml   Filed Weights   11/28/20 0955 11/28/20 1622  Weight: 97.5 kg 93.6 kg    Examination:  General exam: Appears calm and comfortable  Respiratory system: Clear to auscultation. Respiratory effort normal. Cardiovascular system: S1 & S2 heard, RRR. No JVD, murmurs, rubs, gallops or clicks. No pedal edema. Gastrointestinal system: Abdomen is nondistended, soft and nontender. No organomegaly or masses felt. Normal bowel sounds heard. Central nervous system: Alert and  oriented. No focal neurological deficits. Extremities: Symmetric 5 x 5 power. Skin: No rashes, lesions or ulcers Psychiatry: Judgement and insight appear normal. Mood & affect appropriate.     Data Reviewed: I have personally reviewed following labs and imaging studies  CBC: Recent Labs  Lab 11/28/20 1015 11/29/20 0426  WBC 10.4 13.5*  NEUTROABS 9.6*  --   HGB 12.5* 11.4*  HCT 36.3* 34.4*  MCV 94.0 96.4  PLT 228 841   Basic Metabolic Panel: Recent Labs  Lab 11/27/20 1548 11/28/20 1015 11/29/20 0426  NA  --  140 140  K  --  3.7 3.7  CL  --  105 108  CO2  --  22 23  GLUCOSE  --  132* 155*  BUN  --  24* 28*  CREATININE 2.10* 1.92* 1.75*  CALCIUM  --  9.7 8.9  MG  --   --  2.1  PHOS  --   --  2.6   GFR: Estimated Creatinine Clearance: 48.2 mL/min (A) (by C-G formula based on SCr of 1.75 mg/dL (H)). Liver Function Tests: Recent Labs  Lab 11/28/20 1015  AST 16  ALT 13  ALKPHOS 38  BILITOT 0.7  PROT 7.8  ALBUMIN 4.6   No results for input(s): LIPASE, AMYLASE in the last 168 hours. No results for input(s): AMMONIA in the last 168 hours. Coagulation Profile: No results for input(s): INR, PROTIME in the last 168 hours. Cardiac Enzymes: No results for input(s): CKTOTAL, CKMB, CKMBINDEX, TROPONINI in the last 168 hours. BNP (last 3 results) No results for input(s): PROBNP in the last 8760 hours. HbA1C: No results for input(s): HGBA1C in the last 72 hours. CBG: Recent Labs  Lab 11/28/20 2356  GLUCAP 168*   Lipid Profile: No results for input(s): CHOL, HDL, LDLCALC, TRIG, CHOLHDL, LDLDIRECT in the last 72 hours. Thyroid Function Tests: No results for input(s): TSH, T4TOTAL, FREET4, T3FREE, THYROIDAB in the last 72 hours. Anemia Panel: No results for input(s): VITAMINB12, FOLATE, FERRITIN, TIBC, IRON, RETICCTPCT in the last 72 hours. Sepsis Labs: No results for input(s): PROCALCITON, LATICACIDVEN in the last 168 hours.  Recent Results (from the past 240  hour(s))  SARS Coronavirus 2 by RT PCR (hospital order, performed in [facility] hospital lab) Nasopharyngeal Nasopharyngeal Swab     Status: None   Collection Time: 11/28/20  9:39 AM   Specimen: Nasopharyngeal Swab  Result Value Ref Range Status   SARS Coronavirus 2 NEGATIVE NEGATIVE Final    Comment: (NOTE) SARS-CoV-2 target nucleic acids are NOT DETECTED.  The SARS-CoV-2 RNA is generally detectable in upper and lower respiratory specimens during the acute phase of infection. The lowest concentration of SARS-CoV-2 viral copies this assay can detect is 250 copies / mL. A negative result does not preclude SARS-CoV-2 infection and should not be used as the sole basis for treatment or other patient management decisions.  A negative result may occur with improper specimen  collection / handling, submission of specimen other than nasopharyngeal swab, presence of viral mutation(s) within the areas targeted by this assay, and inadequate number of viral copies (<250 copies / mL). A negative result must be combined with clinical observations, patient history, and epidemiological information.  Fact Sheet for Patients:   StrictlyIdeas.no  Fact Sheet for Healthcare Providers: BankingDealers.co.za  This test is not yet approved or  cleared by the [place] FDA and has been authorized for detection and/or diagnosis of SARS-CoV-2 by FDA under an Emergency Use Authorization (EUA).  This EUA will remain in effect (meaning this test can be used) for the duration of the COVID-19 declaration under Section 564(b)(1) of the Act, 21 U.S.C. section 360bbb-3(b)(1), unless the authorization is terminated or revoked sooner.  Performed at [facility], [address] [place], [place] [address]   Aerobic/Anaerobic Culture w Gram Stain (surgical/deep wound)     Status: None (Preliminary result)   Collection Time: 11/28/20  1:53 PM   Specimen:  Soft Tissue, Other  Result Value Ref Range Status   Specimen Description   Final    WOUND Performed at [facility], [address], [place], [place] [address]    Special Requests LEFT SUPRAGLOTTIC MASS  Final   Gram Stain   Final    RARE WBC PRESENT,BOTH PMN AND MONONUCLEAR FEW GRAM POSITIVE COCCI IN PAIRS Performed at [facility], [place] [address], [place], [place] [address]    Culture PENDING  Incomplete   Report Status PENDING  Incomplete  MRSA Next Gen by PCR, Nasal     Status: None   Collection Time: 11/28/20  4:20 PM   Specimen: Nasal Mucosa; Nasal Swab  Result Value Ref Range Status   MRSA by PCR Next Gen NOT DETECTED NOT DETECTED Final    Comment: (NOTE) The GeneXpert MRSA Assay (FDA approved for NASAL specimens only), is one component of a comprehensive MRSA colonization surveillance program. It is not intended to diagnose MRSA infection nor to guide or monitor treatment for MRSA infections. Test performance is not FDA approved in patients less than 70 years old. Performed at [facility], [address] [place],  [address]          Radiology Studies: CT SOFT TISSUE NECK W CONTRAST  Result Date: 11/27/2020 CLINICAL DATA:  Laryngeal mass; history of left tonsillar cancer EXAM: CT NECK WITH CONTRAST TECHNIQUE: Multidetector CT imaging of the neck was performed using the standard protocol following the bolus administration of intravenous contrast. CONTRAST:  3mL OMNIPAQUE IOHEXOL 350 MG/ML SOLN COMPARISON:  CT neck 08/01/2019 FINDINGS: Pharynx and larynx: Nasopharynx is unremarkable. Oropharynx unremarkable without evidence of recurrent disease. There is thickening of the epiglottis. Additional soft tissue thickening involving the supraglottic larynx, hypopharynx, and larynx bilaterally. There is probable mild subglottic extension. Laryngeal cartilages are similar in appearance. Due to the bulky exophytic nature of the soft tissue, airway  is patent but narrowed. Salivary glands: Stable appearance of parotid and submandibular glands. Thyroid: Unremarkable. Lymph nodes: No substantial change in left level 2 node with some calcification measuring 1.8 cm on series 2, image 56. There are additional nonenlarged level 2 nodes that have increased in size compared to the prior study. Enlarged right level 5 and 3 nodes on series 2, image 73. The level 5 node measures 1.6 cm and the level 3 node measures 1.6 cm. Nonenlarged but asymmetric right supraclavicular node on series 2, image 90. Nonenlarged left level 2 node on series  2, image 59 has increased in size. Vascular: Major neck vessels are patent. Calcified plaque at the ICA origins, left greater than right. Limited intracranial: No abnormal enhancement. Visualized orbits: Unremarkable. Mastoids and visualized paranasal sinuses: Mild mucosal thickening. Mastoid air cells are clear. Skeleton: Degenerative changes of the cervical spine. Upper chest: No apical lung mass. Other: None. IMPRESSION: Bulky soft tissue thickening involving larynx, supraglottic larynx, and hypopharynx suspicious for malignancy. Probable mild subglottic extension. Stable appearance of laryngeal cartilages that do not appear involved. Airway is patent but narrowed. Enlarged right level 3 and 5 nodes. Additional nonenlarged but indeterminate right level 2 and supraclavicular nodes and left level 2 node. Stable left level 2 node with mild calcification. No evidence of oropharyngeal recurrent disease. These results will be called to the ordering clinician or representative by the Radiologist Assistant, and communication documented in the PACS or [employer]. Electronically Signed   By: Macy Mis M.D.   On: 11/27/2020 16:42   DG Chest Port 1 View  Result Date: 11/28/2020 CLINICAL DATA:  Post tracheostomy. EXAM: PORTABLE CHEST 1 VIEW COMPARISON:  No recent exams.  Head CT 11/12/2019 reviewed FINDINGS: Tracheostomy tube tip  projects over the thoracic inlet in the midline. Patient is post median sternotomy and CABG. Cardiomegaly. Low lung volumes. No pneumothorax or pleural effusion. No confluent airspace disease. No acute osseous abnormalities are seen. IMPRESSION: 1. Tracheostomy tube tip projects over the thoracic inlet in the midline. 2. Cardiomegaly. Low lung volumes. Electronically Signed   By: Keith Rake M.D.   On: 11/28/2020 14:45        Scheduled Meds:  albuterol  2.5 mg Nebulization Q6H   chlorhexidine  15 mL Mouth Rinse BID   Chlorhexidine Gluconate Cloth  6 each Topical Q0600   mouth rinse  15 mL Mouth Rinse q12n4p   Continuous Infusions:  acetaminophen 1,000 mg (11/28/20 2141)   cefTRIAXone (ROCEPHIN)  IV       LOS: 1 day    Time spent: 26 minutes    Sharen Hones, MD [facility]   To contact the attending provider between 7A-7P or the covering provider during after hours 7P-7A, please log into the web site [email or web address] and access using universal [place] password for that web site. If you do not have the password, please call the hospital operator.  11/29/2020, 11:35 AM

## 2020-11-29 NOTE — Evaluation (Signed)
Clinical/Bedside Swallow Evaluation Patient Details  Name: Scott Harding MRN: [id number] Date of Birth: 1956-02-21  Today's Date: 11/29/2020 Time: SLP Start Time (ACUTE ONLY): 1025 SLP Stop Time (ACUTE ONLY): 1055 SLP Time Calculation (min) (ACUTE ONLY): 30 min  Past Medical History:  Past Medical History:  Diagnosis Date   Cancer ([place])    Coronary artery disease    Hypertension    Past Surgical History:  Past Surgical History:  Procedure Laterality Date   CORONARY ARTERY BYPASS GRAFT  2010   Quadruple   HPI:  Patient is a 65 year old former smoker (quit 12 years ago) who presented for emergent management of upper airway obstruction by a left supraglottic mass.  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  He was last seen by radiation oncology and medical oncology around February 2022.  Most recent PET/CT scan was in July 2021 no evidence of current or metastatic disease.  Approximately 3 months ago he developed recurrent left throat pain.  It was refractory to management with OTC medications, prednisone taper and Z-Pak.  He saw ENT on June and underwent laryngoscopy at that time and a CT scan of the tissues of the neck was requested.  The CT scan was performed yesterday and showed tissue thickening of the larynx, supraglottic larynx and hypopharynx suspicious for malignancy as well as possible subglottic extension.  Airway was patent but narrowed.  The patient was brought on 11/28/2020 for emergent awake tracheostomy given the airway narrowing.   Assessment / Plan / Recommendation Clinical Impression  Pt seen after PMV evaluation with PMV in place. At bedside, pt appears to have adequate airway protection when consuming ice chips as well thin liquids via cup and straw. Pt's swallow appears swift and is free of overt s/s of aspiration. Air stacking was not observed and pt didn't exhibit any s/s of  respiratory distress. Pt's vocal quality remained unchanged throughout. When consuming 1/4 tsp of applesauce per bolus, pt's oropharyngeal abilities appeared appropriate. However, at baseline he states that swallowing more advanced textures was very difficult and feel as if "they were clogging up" his throat. Given pt's supraglottic mass, POD #! s/p trach placement and baseline difficulties, recommend very conservative diet of clear liquids at this time. Extensive education was provided to pt and his daughter on appropriate liquids. Plan made for ST to follow pt on Monday with possibility of completing an instrumental swallow study to assess for pharyngeal residue with more solids. SLP Visit Diagnosis: Aphasia (R47.01);Dysphagia, pharyngoesophageal phase (R13.14);Dysphagia, pharyngeal phase (R13.13)    Aspiration Risk  Moderate aspiration risk;Risk for inadequate nutrition/hydration    Diet Recommendation Thin liquid (clear liquid diet)   Liquid Administration via: Spoon;Cup;Straw Medication Administration: Via alternative means Supervision: Patient able to self feed Compensations: Minimize environmental distractions;Slow rate;Small sips/bites Postural Changes: Seated upright at 90 degrees;Remain upright for at least 30 minutes after po intake    Other  Recommendations Oral Care Recommendations: Oral care BID Other Recommendations: Place PMSV during PO intake   Follow up Recommendations  (TBD)      Frequency and Duration min 2x/week  2 weeks       Prognosis Prognosis for Safe Diet Advancement: Fair Barriers to Reach Goals:  (supraglottic mass)      Swallow Study   General Date of Onset: 11/28/20 HPI: Patient is a 65 year old former smoker (quit 12 years ago) who presented for emergent management of upper airway  obstruction by a left supraglottic mass.  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   He was last seen by radiation oncology and medical oncology around February 2022.  Most recent PET/CT scan was in July 2021 no evidence of current or metastatic disease.  Approximately 3 months ago he developed recurrent left throat pain.  It was refractory to management with OTC medications, prednisone taper and Z-Pak.  He saw ENT on June and underwent laryngoscopy at that time and a CT scan of the tissues of the neck was requested.  The CT scan was performed yesterday and showed tissue thickening of the larynx, supraglottic larynx and hypopharynx suspicious for malignancy as well as possible subglottic extension.  Airway was patent but narrowed.  The patient was brought on 11/28/2020 for emergent awake tracheostomy given the airway narrowing. Type of Study: Bedside Swallow Evaluation Previous Swallow Assessment: none in chart Diet Prior to this Study: NPO Temperature Spikes Noted: No Respiratory Status: Trach;Trach Collar Trach Size and Type: Cuff;#6;Deflated;With PMSV in place;With PMSV not in place Behavior/Cognition: Alert;Cooperative;Pleasant mood Oral Cavity Assessment: Within Functional Limits Oral Care Completed by SLP: No Oral Cavity - Dentition: Adequate natural dentition Vision: Functional for self-feeding Self-Feeding Abilities: Able to feed self Patient Positioning: Upright in bed Baseline Vocal Quality: Breathy;Hoarse;Low vocal intensity Volitional Cough: Strong Volitional Swallow: Able to elicit    Oral/Motor/Sensory Function Overall Oral Motor/Sensory Function: Within functional limits   Ice Chips Ice chips: Within functional limits Presentation: Self Fed;Spoon   Thin Liquid Thin Liquid: Within functional limits Presentation: Self Fed;Straw;Cup    Nectar Thick Nectar Thick Liquid: Not tested   Honey Thick Honey Thick Liquid: Not tested   Puree Puree: Within functional limits Presentation: Self Fed;Spoon Other Comments: see impressions statement   Solid     Solid: Not  tested     Scott Harding M.S., CCC-SLP, [place] Pathologist Rehabilitation Services Office [phone]  Scott Harding 11/29/2020,11:53 AM

## 2020-11-29 NOTE — TOC Progression Note (Addendum)
Transition of Care [facility]) - Progression Note    Patient Details  Name: Scott Harding MRN: [id number] Date of Birth: 21-Nov-1955  Transition of Care [facility] CM/SW [place], RN Phone Number: 11/29/2020, 11:54 AM  Clinical Narrative:    Corene Cornea @ [facility] unable to accept trach patients currently.   Call to [place] @ [facility] [phone] requesting availability for new trach patient needing home health care. [place] reports no coverage for [employer].   Confirmed on Call Chaplain will reach out to family to discuss advance directives.      Expected Discharge Plan: [place] Barriers to Discharge: No Barriers Identified  Expected Discharge Plan and Services Expected Discharge Plan: [place] arrangements for the past 2 months: Single Family Home                                       Social Determinants of Health (SDOH) Interventions    Readmission Risk Interventions No flowsheet data found.

## 2020-11-30 LAB — GLUCOSE, CAPILLARY
Glucose-Capillary: 113 mg/dL — ABNORMAL HIGH (ref 70–99)
Glucose-Capillary: 101 mg/dL — ABNORMAL HIGH (ref 70–99)
Glucose-Capillary: 110 mg/dL — ABNORMAL HIGH (ref 70–99)

## 2020-11-30 MED ORDER — SODIUM CHLORIDE 0.9 % IV SOLN
2.0000 g | Freq: Two times a day (BID) | INTRAVENOUS | Status: DC
  Administered 2020-11-30 – 2020-12-02 (×4): 2 g via INTRAVENOUS
  Filled 2020-11-30 (×5): qty 2

## 2020-11-30 MED ORDER — FLUTICASONE PROPIONATE 50 MCG/ACT NA SUSP
1.0000 | Freq: Every day | NASAL | Status: DC
  Administered 2020-11-30 – 2020-12-01 (×2): 1 via NASAL
  Filled 2020-11-30: qty 16

## 2020-11-30 MED ORDER — DIPHENHYDRAMINE HCL 50 MG/ML IJ SOLN
25.0000 mg | Freq: Every evening | INTRAMUSCULAR | Status: DC | PRN
  Administered 2020-12-01: 01:00:00 25 mg via INTRAVENOUS
  Filled 2020-11-30: qty 1

## 2020-11-30 MED ORDER — ACETAMINOPHEN 10 MG/ML IV SOLN
1000.0000 mg | Freq: Once | INTRAVENOUS | Status: AC
  Administered 2020-12-01: 1000 mg via INTRAVENOUS
  Filled 2020-11-30: qty 100

## 2020-11-30 NOTE — Progress Notes (Signed)
PROGRESS NOTE    Scott Harding  KGM:[id number] DOB: [birth year] DOA: 11/28/2020 PCP: Idelle Crouch, MD    Brief Narrative:  Patient is a 65 year old former smoker (quit 12 years ago) who presented for emergent management of upper airway obstruction by a left supraglottic mass.  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  He was last seen by radiation oncology and medical oncology around February 2022.  Most recent PET/CT scan was in July 2021 no evidence of current or metastatic disease.  Approximately 3 months ago he developed recurrent left throat pain.  It was refractory to management with OTC medications, prednisone taper and Z-Pak.  He saw ENT on June and underwent laryngoscopy at that time and a CT scan of the tissues of the neck was requested.  The CT scan was performed yesterday and showed tissue thickening of the larynx, supraglottic larynx and hypopharynx suspicious for malignancy as well as possible subglottic extension.  Airway was patent but narrowed.  The patient was brought today for emergent awake tracheostomy given the airway narrowing.  He has undergone this process.  It is requested the patient be served in stepdown unit post tracheostomy   Assessment & Plan:   Active Problems:   Benign essential hypertension   Squamous cell carcinoma of left tonsil (HCC)   Laryngeal mass   CKD (chronic kidney disease), stage IV ([place])    #1.  Left laryngeal mass status post tracheostomy. Acute bacterial bronchitis. Spoke with respiratory therapist, patient has a large amount of airway secretions need to be suctioned out.  Culture from the sputum so far has grown gram-positive cocci in pairs.  Culture still pending.  Rocephin started. Discussed with ENT, will keep patient today, will discharge home tomorrow with oral antibiotics.   2.  Stage III squamous cell carcinoma of the left tonsillar  pillar. Status post chemo and radiation therapy.  Oncology follow-up as outpatient.  3. Chronic systolic congestive heart failure with ejection fraction 40%. Essential hypertension No evidence of volume overload.  4.  Chronic kidney disease stage IV.  DVT prophylaxis: SCDS Code Status: full Family Communication:  Disposition Plan:    Status is: Inpatient  Remains inpatient appropriate because:IV treatments appropriate due to intensity of illness or inability to take PO and Inpatient level of care appropriate due to severity of illness  Dispo: The patient is from: Home              Anticipated d/c is to: Home              Patient currently is not medically stable to d/c.   Difficult to place patient No        I/O last 3 completed shifts: In: 55 [P.O.:1260; IV Piggyback:300] Out: -  Total I/O In: 480 [P.O.:480] Out: -      Consultants:  ENT  Procedures: Tracheostomy  Antimicrobials: Rocephin.  Subjective: Patient still has cough, large amount airway secretion.  Does not feel short of breath. No fever chills No abdominal pain or nausea vomiting.  Had a normal bowel movement today. No dysuria hematuria.  Objective: Vitals:   11/29/20 2007 11/30/20 0500 11/30/20 0621 11/30/20 0826  BP: (!) 111/59  129/65 132/69  Pulse: 86  64 83  Resp: 18  16 16   Temp: 98.6 F (37 C)  98.7 F (37.1 C) 98 F (36.7 C)  TempSrc: Oral  Oral   SpO2:  98%  98% 98%  Weight:  95 kg    Height:        Intake/Output Summary (Last 24 hours) at 11/30/2020 1159 Last data filed at 11/30/2020 1009 Gross per 24 hour  Intake 2040 ml  Output --  Net 2040 ml   Filed Weights   11/28/20 0955 11/28/20 1622 11/30/20 0500  Weight: 97.5 kg 93.6 kg 95 kg    Examination:  General exam: Appears calm and comfortable  Respiratory system: Clear to auscultation. Respiratory effort normal. Cardiovascular system: S1 & S2 heard, RRR. No JVD, murmurs, rubs, gallops or clicks. No pedal  edema. Gastrointestinal system: Abdomen is nondistended, soft and nontender. No organomegaly or masses felt. Normal bowel sounds heard. Central nervous system: Alert and oriented. No focal neurological deficits. Extremities: Symmetric 5 x 5 power. Skin: No rashes, lesions or ulcers Psychiatry: Judgement and insight appear normal. Mood & affect appropriate.     Data Reviewed: I have personally reviewed following labs and imaging studies  CBC: Recent Labs  Lab 11/28/20 1015 11/29/20 0426  WBC 10.4 13.5*  NEUTROABS 9.6*  --   HGB 12.5* 11.4*  HCT 36.3* 34.4*  MCV 94.0 96.4  PLT 228 621   Basic Metabolic Panel: Recent Labs  Lab 11/27/20 1548 11/28/20 1015 11/29/20 0426  NA  --  140 140  K  --  3.7 3.7  CL  --  105 108  CO2  --  22 23  GLUCOSE  --  132* 155*  BUN  --  24* 28*  CREATININE 2.10* 1.92* 1.75*  CALCIUM  --  9.7 8.9  MG  --   --  2.1  PHOS  --   --  2.6   GFR: Estimated Creatinine Clearance: 48.5 mL/min (A) (by C-G formula based on SCr of 1.75 mg/dL (H)). Liver Function Tests: Recent Labs  Lab 11/28/20 1015  AST 16  ALT 13  ALKPHOS 38  BILITOT 0.7  PROT 7.8  ALBUMIN 4.6   No results for input(s): LIPASE, AMYLASE in the last 168 hours. No results for input(s): AMMONIA in the last 168 hours. Coagulation Profile: No results for input(s): INR, PROTIME in the last 168 hours. Cardiac Enzymes: No results for input(s): CKTOTAL, CKMB, CKMBINDEX, TROPONINI in the last 168 hours. BNP (last 3 results) No results for input(s): PROBNP in the last 8760 hours. HbA1C: No results for input(s): HGBA1C in the last 72 hours. CBG: Recent Labs  Lab 11/28/20 2356 11/29/20 1651 11/30/20 0620 11/30/20 1156  GLUCAP 168* 124* 113* 101*   Lipid Profile: No results for input(s): CHOL, HDL, LDLCALC, TRIG, CHOLHDL, LDLDIRECT in the last 72 hours. Thyroid Function Tests: No results for input(s): TSH, T4TOTAL, FREET4, T3FREE, THYROIDAB in the last 72 hours. Anemia  Panel: No results for input(s): VITAMINB12, FOLATE, FERRITIN, TIBC, IRON, RETICCTPCT in the last 72 hours. Sepsis Labs: No results for input(s): PROCALCITON, LATICACIDVEN in the last 168 hours.  Recent Results (from the past 240 hour(s))  SARS Coronavirus 2 by RT PCR (hospital order, performed in [facility] hospital lab) Nasopharyngeal Nasopharyngeal Swab     Status: None   Collection Time: 11/28/20  9:39 AM   Specimen: Nasopharyngeal Swab  Result Value Ref Range Status   SARS Coronavirus 2 NEGATIVE NEGATIVE Final    Comment: (NOTE) SARS-CoV-2 target nucleic acids are NOT DETECTED.  The SARS-CoV-2 RNA is generally detectable in upper and lower respiratory specimens during the acute phase of infection. The lowest concentration of SARS-CoV-2 viral copies this  assay can detect is 250 copies / mL. A negative result does not preclude SARS-CoV-2 infection and should not be used as the sole basis for treatment or other patient management decisions.  A negative result may occur with improper specimen collection / handling, submission of specimen other than nasopharyngeal swab, presence of viral mutation(s) within the areas targeted by this assay, and inadequate number of viral copies (<250 copies / mL). A negative result must be combined with clinical observations, patient history, and epidemiological information.  Fact Sheet for Patients:   StrictlyIdeas.no  Fact Sheet for Healthcare Providers: BankingDealers.co.za  This test is not yet approved or  cleared by the [place] FDA and has been authorized for detection and/or diagnosis of SARS-CoV-2 by FDA under an Emergency Use Authorization (EUA).  This EUA will remain in effect (meaning this test can be used) for the duration of the COVID-19 declaration under Section 564(b)(1) of the Act, 21 U.S.C. section 360bbb-3(b)(1), unless the authorization is terminated or revoked  sooner.  Performed at [facility], [address], [place], [place] [address]   Aerobic/Anaerobic Culture w Gram Stain (surgical/deep wound)     Status: None (Preliminary result)   Collection Time: 11/28/20  1:53 PM   Specimen: Soft Tissue, Other  Result Value Ref Range Status   Specimen Description   Final    WOUND Performed at [facility], [address], [place], [place] [address]    Special Requests LEFT SUPRAGLOTTIC MASS  Final   Gram Stain   Final    RARE WBC PRESENT,BOTH PMN AND MONONUCLEAR FEW GRAM POSITIVE COCCI IN PAIRS    Culture   Final    CULTURE REINCUBATED FOR BETTER GROWTH Performed at [facility], [place] [address] [place], [place] [address]    Report Status PENDING  Incomplete  MRSA Next Gen by PCR, Nasal     Status: None   Collection Time: 11/28/20  4:20 PM   Specimen: Nasal Mucosa; Nasal Swab  Result Value Ref Range Status   MRSA by PCR Next Gen NOT DETECTED NOT DETECTED Final    Comment: (NOTE) The GeneXpert MRSA Assay (FDA approved for NASAL specimens only), is one component of a comprehensive MRSA colonization surveillance program. It is not intended to diagnose MRSA infection nor to guide or monitor treatment for MRSA infections. Test performance is not FDA approved in patients less than 51 years old. Performed at [facility], [address] [place], [place] [address]          Radiology Studies: [facility] Chest [place] 1 View  Result Date: 11/28/2020 CLINICAL DATA:  Post tracheostomy. EXAM: PORTABLE CHEST 1 VIEW COMPARISON:  No recent exams.  Head CT 11/12/2019 reviewed FINDINGS: Tracheostomy tube tip projects over the thoracic inlet in the midline. Patient is post median sternotomy and CABG. Cardiomegaly. Low lung volumes. No pneumothorax or pleural effusion. No confluent airspace disease. No acute osseous abnormalities are seen. IMPRESSION: 1. Tracheostomy tube tip projects over the thoracic inlet in the midline. 2.  Cardiomegaly. Low lung volumes. Electronically Signed   By: Keith Rake M.D.   On: 11/28/2020 14:45   ECHOCARDIOGRAM COMPLETE  Result Date: 11/29/2020    ECHOCARDIOGRAM REPORT   Patient Name:   Scott Harding Date of Exam: 11/29/2020 Medical Rec #:  [id number]     Height:       69.0 in Accession #:    [id number]    Weight:       206.3 lb  Date of Birth:  1955/09/14    BSA:          2.094 m Patient Age:    72 years      BP:           109/64 mmHg Patient Gender: M             HR:           63 bpm. Exam Location:  [facility] Procedure: 2D Echo Indications:     Cardiomyopathy  History:         Patient has no prior history of Echocardiogram examinations.                  CAD; Risk Factors:Former Smoker and Hypertension.  Sonographer:     L Thornton-Maynard Referring Phys:  [id number] CARMEN L GONZALEZ Diagnosing Phys: Kathlyn Sacramento MD IMPRESSIONS  1. Left ventricular ejection fraction, by estimation, is 50 to 55%. The left ventricle has low normal function. Left ventricular endocardial border not optimally defined to evaluate regional wall motion. Left ventricular diastolic parameters were normal.  2. Right ventricular systolic function is normal. The right ventricular size is normal. Tricuspid regurgitation signal is inadequate for assessing PA pressure.  3. The mitral valve is normal in structure. Mild mitral valve regurgitation. No evidence of mitral stenosis.  4. The aortic valve is normal in structure. Aortic valve regurgitation is trivial. No aortic stenosis is present.  5. The inferior vena cava is normal in size with greater than 50% respiratory variability, suggesting right atrial pressure of 3 mmHg. FINDINGS  Left Ventricle: Left ventricular ejection fraction, by estimation, is 50 to 55%. The left ventricle has low normal function. Left ventricular endocardial border not optimally defined to evaluate regional wall motion. The left ventricular internal cavity  size was normal in size. There is no left ventricular  hypertrophy. Left ventricular diastolic parameters were normal. Right Ventricle: The right ventricular size is normal. No increase in right ventricular wall thickness. Right ventricular systolic function is normal. Tricuspid regurgitation signal is inadequate for assessing PA pressure. The tricuspid regurgitant velocity is 2.16 m/s, and with an assumed right atrial pressure of 3 mmHg, the estimated right ventricular systolic pressure is 13.0 mmHg. Left Atrium: Left atrial size was normal in size. Right Atrium: Right atrial size was normal in size. Pericardium: There is no evidence of pericardial effusion. Mitral Valve: The mitral valve is normal in structure. Mild mitral valve regurgitation. No evidence of mitral valve stenosis. Tricuspid Valve: The tricuspid valve is normal in structure. Tricuspid valve regurgitation is trivial. No evidence of tricuspid stenosis. Aortic Valve: The aortic valve is normal in structure. Aortic valve regurgitation is trivial. No aortic stenosis is present. Aortic valve mean gradient measures 5.0 mmHg. Aortic valve peak gradient measures 9.5 mmHg. Aortic valve area, by VTI measures 1.68 cm. Pulmonic Valve: The pulmonic valve was normal in structure. Pulmonic valve regurgitation is mild. No evidence of pulmonic stenosis. Aorta: The aortic root is normal in size and structure. Venous: The inferior vena cava is normal in size with greater than 50% respiratory variability, suggesting right atrial pressure of 3 mmHg. IAS/Shunts: No atrial level shunt detected by color flow Doppler.  LEFT VENTRICLE PLAX 2D LVIDd:         4.99 cm     Diastology LVIDs:         3.79 cm     LV e' medial:    5.33 cm/s LV PW:  0.90 cm     LV E/e' medial:  15.8 LV IVS:        0.98 cm     LV e' lateral:   9.03 cm/s LVOT diam:     1.90 cm     LV E/e' lateral: 9.3 LV SV:         57 LV SV Index:   27 LVOT Area:     2.84 cm  LV Volumes (MOD) LV vol d, MOD A2C: 57.4 ml LV vol d, MOD A4C: 72.7 ml LV vol s, MOD  A2C: 31.3 ml LV vol s, MOD A4C: 36.6 ml LV SV MOD A2C:     26.1 ml LV SV MOD A4C:     72.7 ml LV SV MOD BP:      32.7 ml RIGHT VENTRICLE RV S prime:     10.80 cm/s TAPSE (M-mode): 1.4 cm LEFT ATRIUM             Index LA diam:        3.60 cm 1.72 cm/m LA Vol (A2C):   38.7 ml 18.48 ml/m LA Vol (A4C):   44.0 ml 21.02 ml/m LA Biplane Vol: 43.0 ml 20.54 ml/m  AORTIC VALVE                    PULMONIC VALVE AV Area (Vmax):    1.65 cm     PV Vmax:          0.95 m/s AV Area (Vmean):   1.80 cm     PV Peak grad:     3.6 mmHg AV Area (VTI):     1.68 cm     PR End Diast Vel: 5.86 msec AV Vmax:           154.00 cm/s AV Vmean:          103.000 cm/s AV VTI:            0.338 m AV Peak Grad:      9.5 mmHg AV Mean Grad:      5.0 mmHg LVOT Vmax:         89.40 cm/s LVOT Vmean:        65.500 cm/s LVOT VTI:          0.200 m LVOT/AV VTI ratio: 0.59  AORTA Ao Root diam: 3.20 cm MITRAL VALVE               TRICUSPID VALVE MV Area (PHT): 3.53 cm    TR Peak grad:   18.7 mmHg MV Decel Time: 215 msec    TR Vmax:        216.00 cm/s MV E velocity: 84.40 cm/s MV A velocity: 82.30 cm/s  SHUNTS MV E/A ratio:  1.03        Systemic VTI:  0.20 m                            Systemic Diam: 1.90 cm Kathlyn Sacramento MD Electronically signed by Kathlyn Sacramento MD Signature Date/Time: 11/29/2020/8:32:18 PM    Final         Scheduled Meds:  albuterol  2.5 mg Nebulization Q6H   chlorhexidine  15 mL Mouth Rinse BID   mouth rinse  15 mL Mouth Rinse q12n4p   Continuous Infusions:  cefTRIAXone (ROCEPHIN)  IV 1 g (11/30/20 0840)     LOS: 2 days    Time spent: 27 minutes    Finnley Larusso,  MD [facility]   To contact the attending provider between 7A-7P or the covering provider during after hours 7P-7A, please log into the web site [email or web address] and access using universal [place] password for that web site. If you do not have the password, please call the hospital operator.  11/30/2020, 11:59 AM

## 2020-11-30 NOTE — Progress Notes (Signed)
PMV removed. Pt suctioned for moderate amount of thick secretions. Pt placed on 28% ATC for QHS. Cuff remains deflated.

## 2020-11-30 NOTE — Progress Notes (Signed)
..  11/30/2020 10:26 AM  Scott Harding [id number]  Post-Op Day 2    Temp:  [97.5 F (36.4 C)-98.7 F (37.1 C)] 98 F (36.7 C) (08/14 0826) Pulse Rate:  [64-86] 83 (08/14 0826) Resp:  [14-18] 16 (08/14 0826) BP: (111-132)/(59-71) 132/69 (08/14 0826) SpO2:  [94 %-100 %] 98 % (08/14 0826) FiO2 (%):  [28 %] 28 % (08/13 2111) Weight:  [95 kg] 95 kg (08/14 0500),     Intake/Output Summary (Last 24 hours) at 11/30/2020 1026 Last data filed at 11/30/2020 1009 Gross per 24 hour  Intake 2040 ml  Output --  Net 2040 ml    Results for orders placed or performed during the hospital encounter of 11/28/20 (from the past 24 hour(s))  Glucose, capillary     Status: Abnormal   Collection Time: 11/29/20  4:51 PM  Result Value Ref Range   Glucose-Capillary 124 (H) 70 - 99 mg/dL  Glucose, capillary     Status: Abnormal   Collection Time: 11/30/20  6:20 AM  Result Value Ref Range   Glucose-Capillary 113 (H) 70 - 99 mg/dL    SUBJECTIVE:  No acute events.  Slept better.  Did have some coughing last night from secretions.  Breathing well.  Pain controlled with medications.  Tolerating thin liquid diet but did have coughing spells with coffee.  OBJECTIVE:  GEN-  NAD, supine in bed NECK-  trach secure with sutures with minimal drainage.  Trach suctioned gently  IMPRESSION:  s/p tracheostomy for impending airway compromise.  Cultures still pending and pathology still pending to determine nature of soft tissue swelling.  PLAN:  Speech to evaluate with MBSS tomorrow.  Social work/Home health for trach supplies at home.  Instructed patient on how to suction and proper depth for catheter to avoid trauma to trachea/carina.  Will continue to follow.  Scott Harding 11/30/2020, 10:26 AM

## 2020-12-01 ENCOUNTER — Inpatient Hospital Stay: Payer: 59

## 2020-12-01 ENCOUNTER — Encounter: Payer: Self-pay | Admitting: Unknown Physician Specialty

## 2020-12-01 LAB — GLUCOSE, CAPILLARY
Glucose-Capillary: 107 mg/dL — ABNORMAL HIGH (ref 70–99)
Glucose-Capillary: 112 mg/dL — ABNORMAL HIGH (ref 70–99)

## 2020-12-01 MED ORDER — ALBUTEROL SULFATE (2.5 MG/3ML) 0.083% IN NEBU
2.5000 mg | INHALATION_SOLUTION | Freq: Two times a day (BID) | RESPIRATORY_TRACT | Status: DC
  Administered 2020-12-02: 08:00:00 2.5 mg via RESPIRATORY_TRACT
  Filled 2020-12-01: qty 3

## 2020-12-01 MED ORDER — FLUTICASONE PROPIONATE 50 MCG/ACT NA SUSP
1.0000 | Freq: Two times a day (BID) | NASAL | Status: DC
  Administered 2020-12-01 – 2020-12-02 (×2): 1 via NASAL
  Filled 2020-12-01: qty 16

## 2020-12-01 MED ORDER — ALBUTEROL SULFATE (2.5 MG/3ML) 0.083% IN NEBU: 2.5000 mg | INHALATION_SOLUTION | RESPIRATORY_TRACT | Status: DC | PRN

## 2020-12-01 MED ORDER — LORATADINE 10 MG PO TABS
10.0000 mg | ORAL_TABLET | Freq: Every day | ORAL | Status: DC
  Administered 2020-12-01 – 2020-12-02 (×2): 10 mg via ORAL
  Filled 2020-12-01 (×2): qty 1

## 2020-12-01 NOTE — Progress Notes (Signed)
Patient returned from modified barium swallow in stable condition.

## 2020-12-01 NOTE — Progress Notes (Signed)
   12/01/20 0900  Clinical Encounter Type  Visited With Patient and family together  Visit Type Initial;Spiritual support;Social support  Referral From Nurse  Consult/Referral To Chaplain   Chaplain Mariella Saa was paged to complete an AD with PT; his daughter was also at the bedside. Chaplain completed AD education with PT and had it notarized with 2 witnesses. Chaplain made space for PT and his daughter to express their emotions. PT stated he trusts the process and his daughter is the perfect person to put in charge of his medical affairs. Chaplain is available if further needed.

## 2020-12-01 NOTE — Progress Notes (Signed)
12/01/2020 9:02 AM  [name] [id number]  Post-Op Day 3   Temp:  [98.1 F (36.7 C)-99.2 F (37.3 C)] 98.2 F (36.8 C) (08/15 0851) Pulse Rate:  [54-85] 58 (08/15 0851) Resp:  [14-20] 20 (08/15 0851) BP: (109-148)/(60-89) 111/89 (08/15 0851) SpO2:  [96 %-100 %] 100 % (08/15 0851) FiO2 (%):  [28 %] 28 % (08/15 0757) Weight:  [98.8 kg] 98.8 kg (08/15 0621),     Intake/Output Summary (Last 24 hours) at 12/01/2020 0902 Last data filed at 12/01/2020 0500 Gross per 24 hour  Intake 1840 ml  Output --  Net 1840 ml    Results for orders placed or performed during the hospital encounter of 11/28/20 (from the past 24 hour(s))  Glucose, capillary     Status: Abnormal   Collection Time: 11/30/20 11:56 AM  Result Value Ref Range   Glucose-Capillary 101 (H) 70 - 99 mg/dL  Glucose, capillary     Status: Abnormal   Collection Time: 11/30/20  4:56 PM  Result Value Ref Range   Glucose-Capillary 110 (H) 70 - 99 mg/dL  Glucose, capillary     Status: Abnormal   Collection Time: 12/01/20 12:54 AM  Result Value Ref Range   Glucose-Capillary 112 (H) 70 - 99 mg/dL  Glucose, capillary     Status: Abnormal   Collection Time: 12/01/20  6:19 AM  Result Value Ref Range   Glucose-Capillary 107 (H) 70 - 99 mg/dL    SUBJECTIVE:  He is feeling good, up and moving around.    OBJECTIVE:  Trache site clean and dry  IMPRESSION:  Airway obstruction with supraglottic mass s/p tracheostomy  PLAN:  Pathology pending, hopefull back today.  Dr. Grayland Ormond in oncology aware patient is in the hospital.  Final cultures/sensitivities pending as well.  Depending on the path report will determine next course of action.  Patient can be discharged to home as soon as home health is organized and he is able to perform trache care at home (suction machine and trache care teaching).  I will change his trache next week in the clinic.  Will change to a cuffless 6 which will be easier to speak around.  Swallow study today.   He could potentially go home after that is completed.  Oral antibiotics can be given on discharge.    Roena Malady 12/01/2020, 9:02 AM

## 2020-12-01 NOTE — Anesthesia Postprocedure Evaluation (Signed)
Anesthesia Post Note  Patient: Scott Harding  Procedure(s) Performed: AWAKE TRACHEOSTOMY (Neck) DIRECT LARYNGOSCOPY WITH BIOPSY (Throat)  Patient location during evaluation: Nursing Unit Anesthesia Type: General Level of consciousness: awake and alert and oriented Pain management: pain level controlled Vital Signs Assessment: post-procedure vital signs reviewed and stable Respiratory status: spontaneous breathing, nonlabored ventilation, respiratory function stable and patient connected to tracheostomy mask oxygen Cardiovascular status: blood pressure returned to baseline and stable Postop Assessment: no signs of nausea or vomiting Anesthetic complications: no   No notable events documented.   Last Vitals:  Vitals:   12/01/20 0621 12/01/20 0757  BP: 109/67   Pulse: (!) 54   Resp: 14   Temp: 36.7 C   SpO2: 98% 96%    Last Pain:  Vitals:   12/01/20 0621  TempSrc: Oral  PainSc:                  Glendel Jaggers

## 2020-12-01 NOTE — ACP (Advance Care Planning) (Signed)
PT has an AD as of 12/01/20  Point of Contact: Josimar Corning (daughter) [phone]

## 2020-12-01 NOTE — Progress Notes (Signed)
  Speech Language Pathology Treatment: Dysphagia;Passy Muir Speaking valve  Patient Details Name: Scott Harding MRN: [id number] DOB: [birth year] Today's Date: 12/01/2020 Time: 4628-6381 SLP Time Calculation (min) (ACUTE ONLY): 69 min  Assessment / Plan / Recommendation Clinical Impression  I met with pt and his daughter at bedside. We spent time reviewing pt's imaging, identifying dysfunction, silent aspiration, swallow strategies, diet recommendation, aspiration precautions, PMV use, need for intensive swallow rehab as an Outpatient.   Pt and his daughter were not expecting pt's swallow function to be severely impaired and the resultant frank silent aspiration of thin liquids and nectar thick liquids. Encouraged pt and his daughter to write down more questions to be addressed in tomorrow's session.   Emotional support provided.     HPI HPI: Patient is a 65 year old former smoker (quit 12 years ago) who presented for emergent management of upper airway obstruction by a left supraglottic mass.  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  He was last seen by radiation oncology and medical oncology around February 2022.  Most recent PET/CT scan was in July 2021 no evidence of current or metastatic disease.  Approximately 3 months ago he developed recurrent left throat pain.  It was refractory to management with OTC medications, prednisone taper and Z-Pak.  He saw ENT on June and underwent laryngoscopy at that time and a CT scan of the tissues of the neck was requested.  The CT scan was performed yesterday and showed tissue thickening of the larynx, supraglottic larynx and hypopharynx suspicious for malignancy as well as possible subglottic extension.  Airway was patent but narrowed.  The patient was brought on 11/28/2020 for emergent awake tracheostomy given the airway narrowing.      SLP Plan  Continue with  current plan of care       Recommendations  Diet recommendations: Dysphagia 1 (puree);Nectar-thick liquid Liquids provided via: Cup Medication Administration: Crushed with puree Supervision: Patient able to self feed Compensations: Minimize environmental distractions;Slow rate;Small sips/bites Postural Changes and/or Swallow Maneuvers: Seated upright 90 degrees;Upright 30-60 min after meal      Patient may use Passy-Muir Speech Valve: During all waking hours (remove during sleep);During PO intake/meals;Caregiver trained to provide supervision PMSV Supervision: Intermittent MD: Please consider changing trach tube to : Cuffless         Oral Care Recommendations: Oral care BID Follow up Recommendations: Outpatient SLP SLP Visit Diagnosis: Dysphagia, pharyngeal phase (R13.13);Dysphagia, pharyngoesophageal phase (R13.14);Aphonia (R49.1) Plan: Continue with current plan of care       GO               Raney Koeppen B. Rutherford Nail M.S., CCC-SLP, [place] Office [phone]  Stormy Fabian 12/01/2020, 4:24 PM

## 2020-12-01 NOTE — Progress Notes (Signed)
Earlier in shift notified night hospitalist of small to moderate size blood clot with bloody drainage after changing drainage gauze of the trach. After removing the soiled gauze, gently cleaned around trach and the bleeding gradually stopped.  No orders given but to continue to monitor trach. Patient states no pain at this time but says there is just discomfort and explains to Nurse that this is to be expected. Asked patient if he would like something to maybe help with the discomfort and he agreed. Patient did not have anything IV that was less strong than Fentanyl that was listed.  Patient also requested a sleep aid. Notified night hospitalist and requested IV Tylenol  and IV sleep aid for patient not able to swallow  pills with having a new Trach. IV Tylenol given per order and IV Benadryl given per order. Report given to another staff Nurse mid shift and further assisted patient for this Nurse was floated to another floor at that time.

## 2020-12-01 NOTE — Progress Notes (Signed)
Oxygen Qualification Note:  Patient trach dependent 5L 28% FiO2 Trach collar 97-100% O2 saturations at rest and 92-95% with exertion.  O2 sats 86-87% RA at rest.

## 2020-12-01 NOTE — TOC Progression Note (Signed)
Transition of Care [facility]) - Progression Note    Patient Details  Name: NALIN MAZZOCCO MRN: [id number] Date of Birth: [birth year]  [facility] of [facility]) CM/SW Contact  Shelbie Hutching, RN Phone Number: 12/01/2020, 2:54 PM  Clinical Narrative:    RNCM reached out to Rob with [facility] and he will review referral for nursing for trach care at home.  Referral faxed to [phone].    Expected Discharge Plan: [place] Barriers to Discharge: No Barriers Identified  Expected Discharge Plan and Services Expected Discharge Plan: [place] arrangements for the past 2 months: Single Family Home                                       Social Determinants of Health (SDOH) Interventions    Readmission Risk Interventions No flowsheet data found.

## 2020-12-01 NOTE — Therapy (Signed)
I assessed the patient's trach and there was no active bleeding from the trach site.  The blood in the pilot balloon tubing probably came from above the cuff when speech was ensuring that the cuff was down.  The blood in the tubing did not appear to be fresh blood.  [place] with speech spoke with Dr. Tami Ribas and the trach will be changed to an uncuffed next week in the ENT office after pathology comes back.  The cuff should be deflated especially when the PMV is in place.  The patient verbalized understanding.

## 2020-12-01 NOTE — TOC Progression Note (Signed)
Transition of Care [facility]) - Progression Note    Patient Details  Name: Scott Harding MRN: [id number] Date of Birth: 07-25-1955  Transition of Care [facility]) CM/SW Contact  Shelbie Hutching, RN Phone Number: 12/01/2020, 2:35 PM  Clinical Narrative:    RNCM has reached out to [place], [place] all are unable to accept trach patient with commercial insurance.   Respiratory therapy and nursing are working with patient and his daughter on trach care education.  Patient may not be able to receive home health services if home health agency cannot be found.   RNCM has reached out to [employer] for trach supplies, oxygen, humidification, and suction.      Expected Discharge Plan: [place] Barriers to Discharge: No Barriers Identified  Expected Discharge Plan and Services Expected Discharge Plan: [place] arrangements for the past 2 months: Single Family Home                                       Social Determinants of Health (SDOH) Interventions    Readmission Risk Interventions No flowsheet data found.

## 2020-12-01 NOTE — Progress Notes (Signed)
  Speech Language Pathology Treatment: Dysphagia  Patient Details Name: Scott Harding MRN: [id number] DOB: 06/25/1955 Today's Date: 12/01/2020 Time: 3716-9678 SLP Time Calculation (min) (ACUTE ONLY): 25 min  Assessment / Plan / Recommendation Clinical Impression  Pt seen for ongoing PMV toleration, speech intelligibility and swallowing. Pt with PMV off when SLP entered room, moderate bloody secretions at trach. Pt's balloon has been deflated at baseline, however when SLP checked balloon prior to [place] placed, it was partially inflated with blood accumulating in balloon. SLP drew out air and blood, but blood continued dripping into the balloon. I sent a secure chat to Dr Tami Ribas with this information. While pt tolerated PMV moderately well, I am concerned that pt's cuff could be accumulating blood in cuff and compromising his airway.   When PMV initially placed, pt able to phonate with increased quality and durance of phonation noted over previous session (Saturday). However, as blood continued accumulating in balloon, pt appeared to have increased WOB. Remaining blood drawn out of balloon and pt left with his daughter.   Will proceed with instrumental study this afternoon at 1pm.      HPI HPI: Patient is a 65 year old former smoker (quit 12 years ago) who presented for emergent management of upper airway obstruction by a left supraglottic mass.  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  He was last seen by radiation oncology and medical oncology around February 2022.  Most recent PET/CT scan was in July 2021 no evidence of current or metastatic disease.  Approximately 3 months ago he developed recurrent left throat pain.  It was refractory to management with OTC medications, prednisone taper and Z-Pak.  He saw ENT on June and underwent laryngoscopy at that time and a CT scan of the tissues of the neck was  requested.  The CT scan was performed yesterday and showed tissue thickening of the larynx, supraglottic larynx and hypopharynx suspicious for malignancy as well as possible subglottic extension.  Airway was patent but narrowed.  The patient was brought on 11/28/2020 for emergent awake tracheostomy given the airway narrowing.      SLP Plan  Continue with current plan of care       Recommendations  Diet recommendations: Thin liquid Liquids provided via: Cup;Straw Medication Administration: Via alternative means Supervision: Patient able to self feed Compensations: Minimize environmental distractions;Slow rate;Small sips/bites Postural Changes and/or Swallow Maneuvers: Seated upright 90 degrees;Upright 30-60 min after meal      Patient may use Passy-Muir Speech Valve: During all waking hours (remove during sleep);During PO intake/meals;Caregiver trained to provide supervision PMSV Supervision: Intermittent MD: Please consider changing trach tube to : Cuffless (as appropriate)         Oral Care Recommendations: Oral care BID Follow up Recommendations:  (TBD) SLP Visit Diagnosis: Aphonia (R49.1);Dysphagia, pharyngeal phase (R13.13) Plan: Continue with current plan of care       GO               Tanina Barb B. Rutherford Nail M.S., CCC-SLP, [place] Office [phone]  [name] 12/01/2020, 9:08 AM

## 2020-12-01 NOTE — Progress Notes (Signed)
Pt instructed on how to properly suction, remove inner cannula and replace inner cannula. Pt very attentive, understood the process and was very appreciative of instruction.

## 2020-12-01 NOTE — Progress Notes (Signed)
Modified Barium Swallow Progress Note  Patient Details  Name: Scott Harding MRN: [id number] Date of Birth: 05/04/1955  Today's Date: 12/01/2020  Modified Barium Swallow completed.  Full report located under Chart Review in the Imaging Section.  Brief recommendations include the following:  Clinical Impression  Pt presents with severe pharyngeal phase dysphagia with SILENT ASPIRATION of THIN LIQUIDS and NECTAR THICK LIQUIDS. Pt's swallow dysfunction is likely multi-factorial in nature; motor impairment and pharyngeal anatomy. 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 He currently has a #6 Shiley cuffed trach, cuff deflated with PMV in place.   Pt's pharyngeal phase is c/b severely decreased laryngeal movement (both elevation and anterior mobility) which I suspect is related to pt's radiation treatments; minimal epiglottic deflection, which results in [place] during and after the swallow of thin liquids and nectar thick liquids. It appears that after the swallow he aspirates residue left in the arytenoid space. However, he is able to protect his airway when consuming NECTAR THICK LIQUIDS via single cup sips WITH A HEAD TURN TO HIS RIGHT. When consuming thicker consistency, puree, he achieved complete airway protection across all trials. At this time, recommend pt consume dysphagia 1 diet with nectar thick liquids via single cup sips with a HEAD TURN TO HIS RIGHT, medicine crushed in puree. Recommend intensive swallow rehabilitation as an Outpatient to improve laryngeal mobility.   Swallow Evaluation Recommendations       SLP Diet Recommendations: Dysphagia 1 (Puree) solids;Nectar thick liquid   Liquid  Administration via: Cup   Medication Administration: Crushed with puree   Supervision: Patient able to self feed   Compensations: Minimize environmental distractions;Slow rate;Small sips/bites (HEAD TURN TO RIGHT with nectar thick liquids)   Postural Changes: Seated upright at 90 degrees   Oral Care Recommendations: Oral care BID   Other Recommendations: Place PMSV during PO intake  Anabelen Kaminsky B. Rutherford Nail M.S., CCC-SLP, [place] Office [phone]   Usman Millett Rutherford Nail 12/01/2020,4:10 PM

## 2020-12-01 NOTE — Progress Notes (Addendum)
Instructed patient on trach care and suctioning. Patient's daughter was at bedside. Patient and patient's daughter asked questions and were very interactive during instructions. Patient understood instructions and wants more "hands on" practice. Patient also given written instructions for trach care.

## 2020-12-01 NOTE — Progress Notes (Signed)
PROGRESS NOTE    Scott Harding  ONG:[id number] DOB: December 20, 1955 DOA: 11/28/2020 PCP: Idelle Crouch, MD    Brief Narrative:  Patient is a 65 year old former smoker (quit 12 years ago) who presented for emergent management of upper airway obstruction by a left supraglottic mass.  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  He was last seen by radiation oncology and medical oncology around February 2022.  Most recent PET/CT scan was in July 2021 no evidence of current or metastatic disease.  Approximately 3 months ago he developed recurrent left throat pain.  It was refractory to management with OTC medications, prednisone taper and Z-Pak.  He saw ENT on June and underwent laryngoscopy at that time and a CT scan of the tissues of the neck was requested.  The CT scan was performed yesterday and showed tissue thickening of the larynx, supraglottic larynx and hypopharynx suspicious for malignancy as well as possible subglottic extension.  Airway was patent but narrowed.  The patient was brought today for emergent awake tracheostomy given the airway narrowing.  He has undergone this process.  It is requested the patient be served in stepdown unit post tracheostomy   Assessment & Plan:   Active Problems:   Benign essential hypertension   Squamous cell carcinoma of left tonsil (HCC)   Laryngeal mass   CKD (chronic kidney disease), stage IV ([place])  #1.  Left laryngeal mass status post tracheostomy. Acute bacterial bronchitis due to Pseudomonas. Antibiotic switched to cefepime.  Patient still has large amount IV secretion, requiring suctions. I will continue current antibiotics.  #2.  Stage III squamous cell carcinoma of the left tonsillar pillar status post chemo and radiation therapy. Patient be followed by oncology as outpatient.  3.  Chronic systolic congestive heart failure. Essential  hypertension. Reviewed echocardiogram, currently ejection fraction 50 to 55%. Continue blood pressure medicines per  4.  Chronic kidney disease stage IV.   DVT prophylaxis: SCDs Code Status: full Family Communication: Wife at the bedside Disposition Plan:    Status is: Inpatient  Remains inpatient appropriate because:Inpatient level of care appropriate due to severity of illness  Dispo: The patient is from: Home              Anticipated d/c is to: Home              Patient currently is not medically stable to d/c.   Difficult to place patient No        I/O last 3 completed shifts: In: 4401 [P.O.:1740; IV Piggyback:100] Out: -  Total I/O In: 86 [P.O.:720] Out: -      Consultants:  ENT  Procedures: Tracheostomy  Antimicrobials: Cefepime.  Subjective: Patient still require oxygen over trach collar.  Large amount of airway secretion requiring suctioning. No fever chills  No abdominal pain or nausea vomiting. No dysuria or hematuria.  Objective: Vitals:   12/01/20 0621 12/01/20 0757 12/01/20 0851 12/01/20 1145  BP: 109/67  111/89 122/80  Pulse: (!) 54  (!) 58 62  Resp: 14  20 20   Temp: 98.1 F (36.7 C)  98.2 F (36.8 C) 98.4 F (36.9 C)  TempSrc: Oral   Oral  SpO2: 98% 96% 100% 100%  Weight: 98.8 kg     Height:        Intake/Output Summary (Last 24 hours) at 12/01/2020 1304 Last data filed at 12/01/2020 1023 Gross per 24 hour  Intake 2080 ml  Output --  Net 2080 ml   Filed Weights   11/28/20 1622 11/30/20 0500 12/01/20 0621  Weight: 93.6 kg 95 kg 98.8 kg    Examination:  General exam: Appears calm and comfortable  Respiratory system: Clear to auscultation. Respiratory effort normal. Cardiovascular system: S1 & S2 heard, RRR. No JVD, murmurs, rubs, gallops or clicks. No pedal edema. Gastrointestinal system: Abdomen is nondistended, soft and nontender. No organomegaly or masses felt. Normal bowel sounds heard. Central nervous system: Alert  and oriented. No focal neurological deficits. Extremities: Symmetric 5 x 5 power. Skin: No rashes, lesions or ulcers Psychiatry: Judgement and insight appear normal. Mood & affect appropriate.     Data Reviewed: I have personally reviewed following labs and imaging studies  CBC: Recent Labs  Lab 11/28/20 1015 11/29/20 0426  WBC 10.4 13.5*  NEUTROABS 9.6*  --   HGB 12.5* 11.4*  HCT 36.3* 34.4*  MCV 94.0 96.4  PLT 228 962   Basic Metabolic Panel: Recent Labs  Lab 11/27/20 1548 11/28/20 1015 11/29/20 0426  NA  --  140 140  K  --  3.7 3.7  CL  --  105 108  CO2  --  22 23  GLUCOSE  --  132* 155*  BUN  --  24* 28*  CREATININE 2.10* 1.92* 1.75*  CALCIUM  --  9.7 8.9  MG  --   --  2.1  PHOS  --   --  2.6   GFR: Estimated Creatinine Clearance: 49.4 mL/min (A) (by C-G formula based on SCr of 1.75 mg/dL (H)). Liver Function Tests: Recent Labs  Lab 11/28/20 1015  AST 16  ALT 13  ALKPHOS 38  BILITOT 0.7  PROT 7.8  ALBUMIN 4.6   No results for input(s): LIPASE, AMYLASE in the last 168 hours. No results for input(s): AMMONIA in the last 168 hours. Coagulation Profile: No results for input(s): INR, PROTIME in the last 168 hours. Cardiac Enzymes: No results for input(s): CKTOTAL, CKMB, CKMBINDEX, TROPONINI in the last 168 hours. BNP (last 3 results) No results for input(s): PROBNP in the last 8760 hours. HbA1C: No results for input(s): HGBA1C in the last 72 hours. CBG: Recent Labs  Lab 11/30/20 0620 11/30/20 1156 11/30/20 1656 12/01/20 0054 12/01/20 0619  GLUCAP 113* 101* 110* 112* 107*   Lipid Profile: No results for input(s): CHOL, HDL, LDLCALC, TRIG, CHOLHDL, LDLDIRECT in the last 72 hours. Thyroid Function Tests: No results for input(s): TSH, T4TOTAL, FREET4, T3FREE, THYROIDAB in the last 72 hours. Anemia Panel: No results for input(s): VITAMINB12, FOLATE, FERRITIN, TIBC, IRON, RETICCTPCT in the last 72 hours. Sepsis Labs: No results for input(s):  PROCALCITON, LATICACIDVEN in the last 168 hours.  Recent Results (from the past 240 hour(s))  SARS Coronavirus 2 by RT PCR (hospital order, performed in [facility] hospital lab) Nasopharyngeal Nasopharyngeal Swab     Status: None   Collection Time: 11/28/20  9:39 AM   Specimen: Nasopharyngeal Swab  Result Value Ref Range Status   SARS Coronavirus 2 NEGATIVE NEGATIVE Final    Comment: (NOTE) SARS-CoV-2 target nucleic acids are NOT DETECTED.  The SARS-CoV-2 RNA is generally detectable in upper and lower respiratory specimens during the acute phase of infection. The lowest concentration of SARS-CoV-2 viral copies this assay can detect is 250 copies / mL. A negative result does not preclude SARS-CoV-2 infection and should not be used as the sole basis for treatment or other patient management decisions.  A negative result  may occur with improper specimen collection / handling, submission of specimen other than nasopharyngeal swab, presence of viral mutation(s) within the areas targeted by this assay, and inadequate number of viral copies (<250 copies / mL). A negative result must be combined with clinical observations, patient history, and epidemiological information.  Fact Sheet for Patients:   StrictlyIdeas.no  Fact Sheet for Healthcare Providers: BankingDealers.co.za  This test is not yet approved or  cleared by the [place] FDA and has been authorized for detection and/or diagnosis of SARS-CoV-2 by FDA under an Emergency Use Authorization (EUA).  This EUA will remain in effect (meaning this test can be used) for the duration of the COVID-19 declaration under Section 564(b)(1) of the Act, 21 U.S.C. section 360bbb-3(b)(1), unless the authorization is terminated or revoked sooner.  Performed at [facility], [address] [place], [place] [address]   Aerobic/Anaerobic Culture w Gram Stain (surgical/deep wound)      Status: None (Preliminary result)   Collection Time: 11/28/20  1:53 PM   Specimen: Soft Tissue, Other  Result Value Ref Range Status   Specimen Description   Final    WOUND Performed at [facility], [address] [place], [place] [address]    Special Requests LEFT SUPRAGLOTTIC MASS  Final   Gram Stain   Final    RARE WBC PRESENT,BOTH PMN AND MONONUCLEAR FEW GRAM POSITIVE COCCI IN PAIRS    Culture   Final    MODERATE STREPTOCOCCUS MITIS/ORALIS RARE PSEUDOMONAS AERUGINOSA SUSCEPTIBILITIES TO FOLLOW Performed at [facility], [place] [address] [place], [place] [address]    Report Status PENDING  Incomplete  MRSA Next Gen by PCR, Nasal     Status: None   Collection Time: 11/28/20  4:20 PM   Specimen: Nasal Mucosa; Nasal Swab  Result Value Ref Range Status   MRSA by PCR Next Gen NOT DETECTED NOT DETECTED Final    Comment: (NOTE) The GeneXpert MRSA Assay (FDA approved for NASAL specimens only), is one component of a comprehensive MRSA colonization surveillance program. It is not intended to diagnose MRSA infection nor to guide or monitor treatment for MRSA infections. Test performance is not FDA approved in patients less than 90 years old. Performed at [facility], [address] [place], [place] [address]          Radiology Studies: No results found.      Scheduled Meds:  albuterol  2.5 mg Nebulization Q6H   chlorhexidine  15 mL Mouth Rinse BID   fluticasone  1 spray Each Nare BID   loratadine  10 mg Oral Daily   mouth rinse  15 mL Mouth Rinse q12n4p   Continuous Infusions:  ceFEPime (MAXIPIME) IV 2 g (12/01/20 0928)     LOS: 3 days    Time spent: 27 minutes    Sharen Hones, MD [facility]   To contact the attending provider between 7A-7P or the covering provider during after hours 7P-7A, please log into the web site [email or web address] and access using universal [place] password for that web site. If you do not have the  password, please call the hospital operator.  12/01/2020, 1:04 PM

## 2020-12-02 MED ORDER — AMOXICILLIN-POT CLAVULANATE 875-125 MG PO TABS: 1.0000 | ORAL_TABLET | Freq: Two times a day (BID) | ORAL | 0 refills | Status: AC

## 2020-12-02 MED ORDER — CIPROFLOXACIN HCL 500 MG PO TABS: 500.0000 mg | ORAL_TABLET | Freq: Two times a day (BID) | ORAL | 0 refills | Status: AC

## 2020-12-02 MED ORDER — METOPROLOL SUCCINATE ER 25 MG PO TB24: 25.0000 mg | ORAL_TABLET | Freq: Every day | ORAL | 0 refills | Status: DC

## 2020-12-02 NOTE — Progress Notes (Signed)
  Speech Language Pathology Treatment: Dysphagia  Patient Details Name: Scott Harding MRN: [id number] DOB: [birth year] Today's Date: 12/02/2020 Time: 3212-2482 SLP Time Calculation (min) (ACUTE ONLY): 38 min  Assessment / Plan / Recommendation Clinical Impression  Pt seen for ongoing PMV and dysphagia therapy. Skilled observation of pt consuming dysphagia 1 breakfast with nectar thick liquids via cup was provided. Pt independently used right head turn with nectar thick liquids. All questions answered to pt and his daughter's satisfaction at this time. Information regarding food and liquids were written down for pt and follow up contact info was provided.     HPI HPI: Patient is a 65 year old former smoker (quit 12 years ago) who presented for emergent management of upper airway obstruction by a left supraglottic mass.  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  He was last seen by radiation oncology and medical oncology around February 2022.  Most recent PET/CT scan was in July 2021 no evidence of current or metastatic disease.  Approximately 3 months ago he developed recurrent left throat pain.  It was refractory to management with OTC medications, prednisone taper and Z-Pak.  He saw ENT on June and underwent laryngoscopy at that time and a CT scan of the tissues of the neck was requested.  The CT scan was performed yesterday and showed tissue thickening of the larynx, supraglottic larynx and hypopharynx suspicious for malignancy as well as possible subglottic extension.  Airway was patent but narrowed.  The patient was brought on 11/28/2020 for emergent awake tracheostomy given the airway narrowing.      SLP Plan  All goals met       Recommendations  Diet recommendations: Dysphagia 1 (puree);Nectar-thick liquid Liquids provided via: Cup Medication Administration: Crushed with puree Supervision:  Patient able to self feed Compensations: Minimize environmental distractions;Slow rate;Small sips/bites Postural Changes and/or Swallow Maneuvers: Seated upright 90 degrees;Upright 30-60 min after meal      Patient may use Passy-Muir Speech Valve: During all waking hours (remove during sleep);During PO intake/meals;Caregiver trained to provide supervision PMSV Supervision: Intermittent MD: Please consider changing trach tube to : Cuffless         Oral Care Recommendations: Oral care BID Follow up Recommendations: Outpatient SLP SLP Visit Diagnosis: Dysphagia, pharyngeal phase (R13.13);Dysphagia, pharyngoesophageal phase (R13.14);Aphonia (R49.1) Plan: All goals met       GO               Scott Harding Scott Harding M.S., CCC-SLP, [place] Office [phone]  Scott Harding 12/02/2020, 10:56 AM

## 2020-12-02 NOTE — Discharge Summary (Signed)
Physician Discharge Summary  Patient ID: Scott Harding MRN: [id number] DOB/AGE: 23-Dec-1955 65 y.o.  Admit date: 11/28/2020 Discharge date: 12/02/2020  Admission Diagnoses:  Discharge Diagnoses:  Active Problems:   Benign essential hypertension   Squamous cell carcinoma of left tonsil (HCC)   Laryngeal mass   CKD (chronic kidney disease), stage IV [facility])   Discharged Condition: good  Hospital Course:  Patient is a 65 year old former smoker (quit 12 years ago) who presented for emergent management of upper airway obstruction by a left supraglottic mass.  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  He was last seen by radiation oncology and medical oncology around February 2022.  Most recent PET/CT scan was in July 2021 no evidence of current or metastatic disease.  Approximately 3 months ago he developed recurrent left throat pain.  It was refractory to management with OTC medications, prednisone taper and Z-Pak.  He saw ENT on June and underwent laryngoscopy at that time and a CT scan of the tissues of the neck was requested.  The CT scan was performed yesterday and showed tissue thickening of the larynx, supraglottic larynx and hypopharynx suspicious for malignancy as well as possible subglottic extension.  Airway was patent but narrowed.  The patient was brought today for emergent awake tracheostomy given the airway narrowing.  He has undergone this process.  It is requested the patient be served in stepdown unit post tracheostomy    #1.  Left laryngeal mass status post tracheostomy. Acute bacterial bronchitis due to Pseudomonas. Patient treated with cefepime.  Sputum culture positive for Pseudomonas and Streptococcus Mitis.  He has been requiring suctions due to increased airway secretion.  Condition much improved, he has reduced secretion after treatment.  I will continue antibiotics for additional 4  days with Augmentin and Cipro to cover both bacteria.  Patient be followed with ENT and PCP in the near future   #2.  Stage III squamous cell carcinoma of the left tonsillar pillar status post chemo and radiation therapy. Patient be followed by oncology as outpatient.  3.  Chronic systolic congestive heart failure. Essential hypertension. Performed echocardiogram, currently ejection fraction 50 to 55%. Blood pressure is not elevated, I will resume beta-blocker, but hold off hydralazine and ARB.  May resume ARB after seen by PCP.  4.  Chronic kidney disease stage IV.  Consults:  ENT  Significant Diagnostic Studies:   Treatments: Tracheostomy  Discharge Exam: Blood pressure 116/68, pulse (!) 55, temperature 98.4 F (36.9 C), resp. rate 14, height 5\' 9"  (1.753 m), weight 96.7 kg, SpO2 97 %. General appearance: alert and cooperative Resp: clear to auscultation bilaterally Cardio: regular rate and rhythm, S1, S2 normal, no murmur, click, rub or gallop GI: soft, non-tender; bowel sounds normal; no masses,  no organomegaly Extremities: extremities normal, atraumatic, no cyanosis or edema  Disposition: Discharge disposition: 01-Home or Self Care       Discharge Instructions     Diet - low sodium heart healthy   Complete by: As directed    Discharge wound care:   Complete by: As directed    ENT followup   Increase activity slowly   Complete by: As directed       Allergies as of 12/02/2020   No Known Allergies      Medication List     STOP taking these medications    amLODipine 10 MG tablet Commonly known as: NORVASC  ascorbic acid 1000 MG tablet Commonly known as: VITAMIN C   hydrALAZINE 50 MG tablet Commonly known as: APRESOLINE   olmesartan 40 MG tablet Commonly known as: BENICAR   ondansetron 8 MG tablet Commonly known as: Zofran   prochlorperazine 10 MG tablet Commonly known as: COMPAZINE       TAKE these medications    amoxicillin-clavulanate  875-125 MG tablet Commonly known as: Augmentin Take 1 tablet by mouth 2 (two) times daily for 4 days.   aspirin 81 MG chewable tablet Chew 81 mg by mouth daily. What changed: Another medication with the same name was removed. Continue taking this medication, and follow the directions you see here.   ciprofloxacin 500 MG tablet Commonly known as: Cipro Take 1 tablet (500 mg total) by mouth 2 (two) times daily for 4 days.   Denta 5000 Plus 1.1 % Crea dental cream Generic drug: sodium fluoride BRUSH WITH PASTE 2 3 TIMES PER DAY FOR AT LEAST 1 MINUTE   metoprolol succinate 25 MG 24 hr tablet Commonly known as: TOPROL-XL Take 1 tablet by mouth 1 day or 1 dose.   rosuvastatin 40 MG tablet Commonly known as: CRESTOR Take 40 mg by mouth daily.               Durable Medical Equipment  (From admission, onward)           Start     Ordered   12/01/20 1430  For home use only DME oxygen  Once       Question Answer Comment  Length of Need 6 Months   Mode or (Route) Mask   Liters per Minute 5   Frequency Continuous (stationary and portable oxygen unit needed)   Oxygen conserving device Yes   Oxygen delivery system Gas      12/01/20 1429              Discharge Care Instructions  (From admission, onward)           Start     Ordered   12/02/20 0000  Discharge wound care:       Comments: ENT followup   12/02/20 1038            Follow-up Information     Vaught, Jeannie Fend, MD Follow up in 1 week(s).   Specialty: Otolaryngology Contact information: [place] [address] [phone]         Idelle Crouch, MD Follow up in 1 week(s).   Specialty: Internal Medicine Contact information: [place] [address] [phone]                32 minutes Signed: Sharen Hones 12/02/2020, 10:38 AM

## 2020-12-02 NOTE — TOC Transition Note (Signed)
Transition of Care [facility]) - CM/SW Discharge Note   Patient Details  Name: Scott Harding MRN: [id number] Date of Birth: 25-Jun-1955  Transition of [facility] CM/SW Contact:  Shelbie Hutching, RN Phone Number: 12/02/2020, 12:21 PM   Clinical Narrative:    Patient is medically cleared for discharge home today.  RNCM met with both patient and patient's daughter at the bedside.  They are both very anxious to discharge today.  Daughter and patient verbalize understanding that TOC was unable to obtain home health services.  Patient and daughter verbalize that they feel comfortable caring for the trach.  Respiratory spent a lot of time yesterday teaching.  Patient will follow up with speech therapy outpatient.  Patient will also follow up with Dr. Tami Ribas next week in clinic.  RNCM has arranged patient oxygen for home via trach collar, suction and all other needed trach supplies from [employer].  Oxygen has already been delivered to the room.  Suction should be delivered soon to the room.   Daughter will transport patient home.     Final next level of care: Home/Self Care Barriers to Discharge: Barriers Resolved   Patient Goals and CMS Choice Patient states their goals for this hospitalization and ongoing recovery are:: Ready to get home today      Discharge Placement                       Discharge Plan and Services                DME Arranged: Trach supplies, Oxygen DME Agency: [employer] Date DME Agency Contacted: 12/02/20 Time DME Agency Contacted: 1000 Representative spoke with at DME Agency: Thedore Mins and [place] Arranged: NA [place] Agency: NA        Social Determinants of Health ([place]) Interventions     Readmission Risk Interventions No flowsheet data found.

## 2020-12-03 LAB — SURGICAL PATHOLOGY

## 2020-12-04 LAB — AEROBIC/ANAEROBIC CULTURE W GRAM STAIN (SURGICAL/DEEP WOUND)

## 2020-12-05 NOTE — Progress Notes (Signed)
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  INTERVAL HISTORY: Patient returns to clinic today for evaluation and discussion of treatment options.  He recently had tracheostomy for airway progression due to large malignant mass developing in his larynx.  He does not complain of pain today.  He has no neurologic complaints.  He denies any recent fevers or illnesses. He has no chest pain, shortness of breath, cough, or hemoptysis.  He denies any nausea, vomiting, constipation, or diarrhea.  He has no urinary complaints.  Patient offers no further specific complaints today.  REVIEW OF SYSTEMS:   Review of Systems  Constitutional: Negative.  Negative for fever, malaise/fatigue and weight loss.  Respiratory: Negative.  Negative for cough, hemoptysis and shortness of breath.   Cardiovascular: Negative.  Negative for chest pain and leg swelling.  Gastrointestinal: Negative.  Negative for abdominal pain.  Genitourinary: Negative.  Negative for dysuria.  Musculoskeletal: Negative.  Negative for neck pain.  Skin: Negative.  Negative for rash.  Neurological: Negative.  Negative for dizziness, focal weakness, weakness and headaches.  Psychiatric/Behavioral: Negative.  The patient is not nervous/anxious.    As per HPI. Otherwise, a complete review of systems is negative.  PAST MEDICAL HISTORY: Past Medical History:  Diagnosis Date   Cancer [facility])    Coronary artery disease    Hypertension     PAST  SURGICAL HISTORY: Past Surgical History:  Procedure Laterality Date   CORONARY ARTERY BYPASS GRAFT  2010   Quadruple   DIRECT LARYNGOSCOPY  11/28/2020   Procedure: DIRECT LARYNGOSCOPY WITH BIOPSY;  Surgeon: Beverly Gust, MD;  Location: [facility] ORS;  Service: ENT;;   TRACHEOSTOMY TUBE PLACEMENT N/A 11/28/2020   Procedure: AWAKE TRACHEOSTOMY;  Surgeon: Beverly Gust, MD;  Location: [facility] ORS;  Service: ENT;  Laterality: N/A;    FAMILY HISTORY: Family History  Problem Relation Age of Onset   Arthritis Mother    Heart attack Father    Brain cancer Sister     ADVANCED DIRECTIVES (Y/N):  N  HEALTH MAINTENANCE: Social History   Tobacco Use   Smoking status: Former    Types: Cigarettes    Quit date: 10/17/2008    Years since quitting: 12.1   Smokeless tobacco: Never  Vaping Use   Vaping Use: Never used  Substance Use Topics   Alcohol use: Yes    Comment: occasionally   Drug use: Never     Colonoscopy:  PAP:  Bone density:  Lipid panel:  No Known Allergies  Current Outpatient Medications  Medication Sig Dispense Refill   aspirin 81 MG chewable tablet Chew 81 mg by mouth daily.     DENTA 5000 PLUS 1.1 % CREA dental cream BRUSH WITH PASTE 2 3 TIMES PER DAY FOR AT LEAST 1 MINUTE     metoprolol succinate (TOPROL-XL) 25 MG 24 hr tablet Take 1 tablet (25 mg total) by mouth daily. 30 tablet 0   rosuvastatin (CRESTOR) 40 MG tablet Take 40 mg by mouth daily.     No current facility-administered medications for this visit.    OBJECTIVE: Vitals:  12/11/20 0911  BP: 113/60  Pulse: (!) 56  Resp: 20  Temp: (!) 96.2 F (35.7 C)  SpO2: 100%     Body mass index is 30.32 kg/m.    ECOG FS:0 - Asymptomatic  General: Well-developed, well-nourished, no acute distress. Eyes: Pink conjunctiva, anicteric sclera. HEENT: Normocephalic, moist mucous membranes. Lungs: No audible wheezing or coughing. Heart: Regular rate and rhythm. Abdomen: Soft, nontender, no obvious  distention. Musculoskeletal: No edema, cyanosis, or clubbing. Neuro: Alert, answering all questions appropriately. Cranial nerves grossly intact. Skin: No rashes or petechiae noted. Psych: Normal affect.  LAB RESULTS:  Lab Results  Component Value Date   NA 140 11/29/2020   K 3.7 11/29/2020   CL 108 11/29/2020   CO2 23 11/29/2020   GLUCOSE 155 (H) 11/29/2020   BUN 28 (H) 11/29/2020   CREATININE 1.75 (H) 11/29/2020   CALCIUM 8.9 11/29/2020   PROT 7.8 11/28/2020   ALBUMIN 4.6 11/28/2020   AST 16 11/28/2020   ALT 13 11/28/2020   ALKPHOS 38 11/28/2020   BILITOT 0.7 11/28/2020   GFRNONAA 43 (L) 11/29/2020   GFRAA 37 (L) 11/13/2019    Lab Results  Component Value Date   WBC 13.5 (H) 11/29/2020   NEUTROABS 9.6 (H) 11/28/2020   HGB 11.4 (L) 11/29/2020   HCT 34.4 (L) 11/29/2020   MCV 96.4 11/29/2020   PLT 203 11/29/2020     STUDIES: CT SOFT TISSUE NECK W CONTRAST  Result Date: 11/27/2020 CLINICAL DATA:  Laryngeal mass; history of left tonsillar cancer EXAM: CT NECK WITH CONTRAST TECHNIQUE: Multidetector CT imaging of the neck was performed using the standard protocol following the bolus administration of intravenous contrast. CONTRAST:  58mL OMNIPAQUE IOHEXOL 350 MG/ML SOLN COMPARISON:  CT neck 08/01/2019 FINDINGS: Pharynx and larynx: Nasopharynx is unremarkable. Oropharynx unremarkable without evidence of recurrent disease. There is thickening of the epiglottis. Additional soft tissue thickening involving the supraglottic larynx, hypopharynx, and larynx bilaterally. There is probable mild subglottic extension. Laryngeal cartilages are similar in appearance. Due to the bulky exophytic nature of the soft tissue, airway is patent but narrowed. Salivary glands: Stable appearance of parotid and submandibular glands. Thyroid: Unremarkable. Lymph nodes: No substantial change in left level 2 node with some calcification measuring 1.8 cm on series 2, image 56. There are additional  nonenlarged level 2 nodes that have increased in size compared to the prior study. Enlarged right level 5 and 3 nodes on series 2, image 73. The level 5 node measures 1.6 cm and the level 3 node measures 1.6 cm. Nonenlarged but asymmetric right supraclavicular node on series 2, image 90. Nonenlarged left level 2 node on series 2, image 59 has increased in size. Vascular: Major neck vessels are patent. Calcified plaque at the ICA origins, left greater than right. Limited intracranial: No abnormal enhancement. Visualized orbits: Unremarkable. Mastoids and visualized paranasal sinuses: Mild mucosal thickening. Mastoid air cells are clear. Skeleton: Degenerative changes of the cervical spine. Upper chest: No apical lung mass. Other: None. IMPRESSION: Bulky soft tissue thickening involving larynx, supraglottic larynx, and hypopharynx suspicious for malignancy. Probable mild subglottic extension. Stable appearance of laryngeal cartilages that do not appear involved. Airway is patent but narrowed. Enlarged right level 3 and 5 nodes. Additional nonenlarged but indeterminate right level 2 and supraclavicular nodes and left level 2 node. Stable left level 2 node with mild calcification. No evidence of oropharyngeal recurrent disease. These results will be called to the ordering clinician or representative by the Radiologist Assistant, and communication documented  in the PACS or [employer]. Electronically Signed   By: Macy Mis M.D.   On: 11/27/2020 16:42   DG Chest Port 1 View  Result Date: 11/28/2020 CLINICAL DATA:  Post tracheostomy. EXAM: PORTABLE CHEST 1 VIEW COMPARISON:  No recent exams.  Head CT 11/12/2019 reviewed FINDINGS: Tracheostomy tube tip projects over the thoracic inlet in the midline. Patient is post median sternotomy and CABG. Cardiomegaly. Low lung volumes. No pneumothorax or pleural effusion. No confluent airspace disease. No acute osseous abnormalities are seen. IMPRESSION: 1. Tracheostomy  tube tip projects over the thoracic inlet in the midline. 2. Cardiomegaly. Low lung volumes. Electronically Signed   By: Keith Rake M.D.   On: 11/28/2020 14:45   ECHOCARDIOGRAM COMPLETE  Result Date: 11/29/2020    ECHOCARDIOGRAM REPORT   Patient Name:   Scott Harding Date of Exam: 11/29/2020 Medical Rec #:  [id number]     Height:       69.0 in Accession #:    [id number]    Weight:       206.3 lb Date of Birth:  1955-10-08    BSA:          2.094 m Patient Age:    65 years      BP:           109/64 mmHg Patient Gender: M             HR:           63 bpm. Exam Location:  [facility] Procedure: 2D Echo Indications:     Cardiomyopathy  History:         Patient has no prior history of Echocardiogram examinations.                  CAD; Risk Factors:Former Smoker and Hypertension.  Sonographer:     L Thornton-Maynard Referring Phys:  [id number] CARMEN L GONZALEZ Diagnosing Phys: Kathlyn Sacramento MD IMPRESSIONS  1. Left ventricular ejection fraction, by estimation, is 50 to 55%. The left ventricle has low normal function. Left ventricular endocardial border not optimally defined to evaluate regional wall motion. Left ventricular diastolic parameters were normal.  2. Right ventricular systolic function is normal. The right ventricular size is normal. Tricuspid regurgitation signal is inadequate for assessing PA pressure.  3. The mitral valve is normal in structure. Mild mitral valve regurgitation. No evidence of mitral stenosis.  4. The aortic valve is normal in structure. Aortic valve regurgitation is trivial. No aortic stenosis is present.  5. The inferior vena cava is normal in size with greater than 50% respiratory variability, suggesting right atrial pressure of 3 mmHg. FINDINGS  Left Ventricle: Left ventricular ejection fraction, by estimation, is 50 to 55%. The left ventricle has low normal function. Left ventricular endocardial border not optimally defined to evaluate regional wall motion. The left ventricular internal  cavity  size was normal in size. There is no left ventricular hypertrophy. Left ventricular diastolic parameters were normal. Right Ventricle: The right ventricular size is normal. No increase in right ventricular wall thickness. Right ventricular systolic function is normal. Tricuspid regurgitation signal is inadequate for assessing PA pressure. The tricuspid regurgitant velocity is 2.16 m/s, and with an assumed right atrial pressure of 3 mmHg, the estimated right ventricular systolic pressure is 57.3 mmHg. Left Atrium: Left atrial size was normal in size. Right Atrium: Right atrial size was normal in size. Pericardium: There is no evidence of pericardial effusion. Mitral Valve: The mitral valve is normal in structure.  Mild mitral valve regurgitation. No evidence of mitral valve stenosis. Tricuspid Valve: The tricuspid valve is normal in structure. Tricuspid valve regurgitation is trivial. No evidence of tricuspid stenosis. Aortic Valve: The aortic valve is normal in structure. Aortic valve regurgitation is trivial. No aortic stenosis is present. Aortic valve mean gradient measures 5.0 mmHg. Aortic valve peak gradient measures 9.5 mmHg. Aortic valve area, by VTI measures 1.68 cm. Pulmonic Valve: The pulmonic valve was normal in structure. Pulmonic valve regurgitation is mild. No evidence of pulmonic stenosis. Aorta: The aortic root is normal in size and structure. Venous: The inferior vena cava is normal in size with greater than 50% respiratory variability, suggesting right atrial pressure of 3 mmHg. IAS/Shunts: No atrial level shunt detected by color flow Doppler.  LEFT VENTRICLE PLAX 2D LVIDd:         4.99 cm     Diastology LVIDs:         3.79 cm     LV e' medial:    5.33 cm/s LV PW:         0.90 cm     LV E/e' medial:  15.8 LV IVS:        0.98 cm     LV e' lateral:   9.03 cm/s LVOT diam:     1.90 cm     LV E/e' lateral: 9.3 LV SV:         57 LV SV Index:   27 LVOT Area:     2.84 cm  LV Volumes (MOD) LV vol d,  MOD A2C: 57.4 ml LV vol d, MOD A4C: 72.7 ml LV vol s, MOD A2C: 31.3 ml LV vol s, MOD A4C: 36.6 ml LV SV MOD A2C:     26.1 ml LV SV MOD A4C:     72.7 ml LV SV MOD BP:      32.7 ml RIGHT VENTRICLE RV S prime:     10.80 cm/s TAPSE (M-mode): 1.4 cm LEFT ATRIUM             Index LA diam:        3.60 cm 1.72 cm/m LA Vol (A2C):   38.7 ml 18.48 ml/m LA Vol (A4C):   44.0 ml 21.02 ml/m LA Biplane Vol: 43.0 ml 20.54 ml/m  AORTIC VALVE                    PULMONIC VALVE AV Area (Vmax):    1.65 cm     PV Vmax:          0.95 m/s AV Area (Vmean):   1.80 cm     PV Peak grad:     3.6 mmHg AV Area (VTI):     1.68 cm     PR End Diast Vel: 5.86 msec AV Vmax:           154.00 cm/s AV Vmean:          103.000 cm/s AV VTI:            0.338 m AV Peak Grad:      9.5 mmHg AV Mean Grad:      5.0 mmHg LVOT Vmax:         89.40 cm/s LVOT Vmean:        65.500 cm/s LVOT VTI:          0.200 m LVOT/AV VTI ratio: 0.59  AORTA Ao Root diam: 3.20 cm MITRAL VALVE  TRICUSPID VALVE MV Area (PHT): 3.53 cm    TR Peak grad:   18.7 mmHg MV Decel Time: 215 msec    TR Vmax:        216.00 cm/s MV E velocity: 84.40 cm/s MV A velocity: 82.30 cm/s  SHUNTS MV E/A ratio:  1.03        Systemic VTI:  0.20 m                            Systemic Diam: 1.90 cm Kathlyn Sacramento MD Electronically signed by Kathlyn Sacramento MD Signature Date/Time: 11/29/2020/8:32:18 PM    Final     ASSESSMENT: 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   PLAN:    1.  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  Both malignancies are p16 positive.  Patient has had an emergency trach placed secondary to airway protection.  He will require a PET scan to complete the staging work-up.  He had evaluation by radiation oncology who is recommending concurrent chemo along with XRT.  Patient also has a  second opinion with surgery at [facility] next week to determine if surgical resection is the best initial approach.  Will plan to initiate concurrent chemo and XRT with weekly cisplatin in approximately 2 weeks but will further discuss case with surgery prior to initiating.  2.  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  PET scan results from November 12, 2019 reviewed independently with no obvious evidence of recurrent or progressive disease.  Level 2 lymph node is no longer hypermetabolic.  3.  Renal insufficiency.: Chronic and unchanged.  Patient's most recent creatinine is 1.75.  We will need to proceed with cisplatin cautiously. 4.  Anemia: Mild, monitor.  I spent a total of 30 minutes reviewing chart data, face-to-face evaluation with the patient, counseling and coordination of care as detailed above.    Patient expressed understanding and was in agreement with this plan. He also understands that He can call clinic at any time with any questions, concerns, or complaints.   Cancer Staging Malignant neoplasm of supraglottis [facility]) Staging form: Larynx - Supraglottis, AJCC 8th Edition - Clinical stage from 12/11/2020: Stage IVA (cT3, cN2b, cM0) - Signed by Lloyd Huger, MD on 12/11/2020  Squamous cell carcinoma of left tonsil ([place]) Staging form: Pharynx - HPV-Mediated Oropharynx, AJCC 8th Edition - Clinical: No stage assigned - Unsigned   Lloyd Huger, MD   12/11/2020 1:27 PM

## 2020-12-10 LAB — ACID FAST SMEAR (AFB, MYCOBACTERIA): Acid Fast Smear: NEGATIVE

## 2020-12-11 ENCOUNTER — Ambulatory Visit
Admission: RE | Admit: 2020-12-11 | Discharge: 2020-12-11 | Disposition: A | Discharge status: Home / Self Care | Payer: 59 | Source: Ambulatory Visit | Attending: Radiation Oncology | Admitting: Radiation Oncology

## 2020-12-11 ENCOUNTER — Ambulatory Visit: Payer: 59

## 2020-12-11 ENCOUNTER — Inpatient Hospital Stay: Payer: 59 | Attending: Oncology | Admitting: Oncology

## 2020-12-11 ENCOUNTER — Encounter: Payer: Self-pay | Admitting: Oncology

## 2020-12-11 VITALS — BP 113/60 | HR 56 | Temp 96.2°F | Resp 20 | Wt 205.3 lb

## 2020-12-11 DIAGNOSIS — Z79899 Other long term (current) drug therapy: Secondary | ICD-10-CM | POA: Diagnosis not present

## 2020-12-11 DIAGNOSIS — N189 Chronic kidney disease, unspecified: Secondary | ICD-10-CM | POA: Insufficient documentation

## 2020-12-11 DIAGNOSIS — Z87891 Personal history of nicotine dependence: Secondary | ICD-10-CM | POA: Diagnosis not present

## 2020-12-11 DIAGNOSIS — C321 Malignant neoplasm of supraglottis: Secondary | ICD-10-CM | POA: Diagnosis not present

## 2020-12-11 DIAGNOSIS — D649 Anemia, unspecified: Secondary | ICD-10-CM | POA: Diagnosis not present

## 2020-12-11 DIAGNOSIS — J387 Other diseases of larynx: Secondary | ICD-10-CM | POA: Diagnosis not present

## 2020-12-11 DIAGNOSIS — C099 Malignant neoplasm of tonsil, unspecified: Secondary | ICD-10-CM | POA: Diagnosis not present

## 2020-12-11 DIAGNOSIS — C760 Malignant neoplasm of head, face and neck: Secondary | ICD-10-CM

## 2020-12-11 NOTE — Progress Notes (Signed)
Radiation Oncology Follow up Note old patient new area laryngeal cancer  Name: Scott Harding   Date:   12/11/2020 MRN:  [id number] DOB: 03-16-1956    This 65 y.o. male presents to the clinic today for reevaluation of patient well-known to our department.  Now out 21 months having completed concurrent chemoradiation therapy for stage III squamous cell carcinoma [place] of the left tonsillar pillar now with locally advanced laryngeal carcinoma  REFERRING PROVIDER: Idelle Crouch, MD  HPI: Patient is a 65 year old male well-known to our department having previously treated with concurrent chemoradiation for stage III squamous cell carcinoma the left tonsillar pillar..  Patient has been doing well with this PET CT scan back 1 year prior showing only a 1.6 cm short axis necrotic level 2 cervical node non-FDG-avid otherwise no findings suggestive of recurrent disease.  He is gone on to have airway obstruction necessitating emergent tracheostomy.  At the time there was a large exophytic left supraglottic mass involving the left aryepiglottic fold extending down to the arytenoid.  The true vocal cords could not be visualized.  Pathology was positive for moderately differentiated squamous cell carcinoma p16 positive by immunohistochemistry.  CT scan of the soft tissue of the head and neck showed bulky soft tissue thickening involving the larynx supraglottic larynx and hypopharynx suspicious for malignancy.  There was probable mild subglottic extension.  Laryngeal cart does not appear involved.  He did have a large level 3 and 5 nodes.  PET CT scan has been ordered.  He is now referred to radiation collagen medical oncology for consideration of treatment.  He also has appointment next week [facility] with Dr. Frazier Butt for ENT consultation regarding surgical intervention.  He is seen today his swallowing is somewhat compromised and does have a patent working trach.  COMPLICATIONS OF TREATMENT: none  FOLLOW UP  COMPLIANCE: keeps appointments   PHYSICAL EXAM:  There were no vitals taken for this visit. Patient has a functioning trach.  There is some subtle adenopathy bilaterally in the head and neck region.  Well-developed well-nourished patient in NAD. HEENT reveals PERLA, EOMI, discs not visualized.  Oral cavity is clear. No oral mucosal lesions are identified. Neck is clear without evidence of cervical or supraclavicular adenopathy. Lungs are clear to A&P. Cardiac examination is essentially unremarkable with regular rate and rhythm without murmur rub or thrill. Abdomen is benign with no organomegaly or masses noted. Motor sensory and DTR levels are equal and symmetric in the upper and lower extremities. Cranial nerves II through XII are grossly intact. Proprioception is intact. No peripheral adenopathy or edema is identified. No motor or sensory levels are noted. Crude visual fields are within normal range.  RADIOLOGY RESULTS: CT scans reviewed PET CT scans to be reviewed when available  PLAN: At this time this is a tough case of second primary of the supraglottic larynx.  This area did not receive radiation from his prior fields.  I have asked for a PET scan to be ordered to delineate exactly the amount of disease we are dealing with.  Certainly 1 option would be concurrent chemoradiation therapy with curative intent.  I also believe [facility] would recommend surgical intervention.  He would probably need concurrent chemoradiation after completion of surgery at based on the locally advanced nature of his disease.  Surgery can also be reserved for salvage should chemoradiation fail.  Risks and benefits of radiation including increased dysphagia skin reaction fatigue alteration of blood counts lymphedema in head neck region all  were discussed in detail with the patient and his daughter.  They both comprehend our treatment plan well.  I tentatively set up a date after his evaluation at [facility] and his PET scan for simulation  should they proceed with that option.  I would like to take this opportunity to thank you for allowing me to participate in the care of your patient.Noreene Filbert, MD

## 2020-12-16 ENCOUNTER — Ambulatory Visit
Admission: RE | Admit: 2020-12-16 | Discharge: 2020-12-16 | Disposition: A | Discharge status: Home / Self Care | Payer: 59 | Source: Ambulatory Visit | Attending: Oncology | Admitting: Oncology

## 2020-12-16 ENCOUNTER — Other Ambulatory Visit: Payer: Self-pay

## 2020-12-16 DIAGNOSIS — C7801 Secondary malignant neoplasm of right lung: Secondary | ICD-10-CM | POA: Insufficient documentation

## 2020-12-16 DIAGNOSIS — C099 Malignant neoplasm of tonsil, unspecified: Secondary | ICD-10-CM | POA: Diagnosis not present

## 2020-12-16 LAB — GLUCOSE, CAPILLARY: Glucose-Capillary: 76 mg/dL (ref 70–99)

## 2020-12-16 MED ORDER — FLUDEOXYGLUCOSE F - 18 (FDG) INJECTION
10.6700 | Freq: Once | INTRAVENOUS | Status: AC | PRN
  Administered 2020-12-16: 10.67 via INTRAVENOUS

## 2020-12-16 NOTE — Progress Notes (Signed)
Pharmacist Chemotherapy Monitoring - Initial Assessment    Anticipated start date: 12/24/20   The following has been reviewed per standard work regarding the patient's treatment regimen: The patient's diagnosis, treatment plan and drug doses, and organ/hematologic function Lab orders and baseline tests specific to treatment regimen  The treatment plan start date, drug sequencing, and pre-medications Prior authorization status  Patient's documented medication list, including drug-drug interaction screen and prescriptions for anti-emetics and supportive care specific to the treatment regimen The drug concentrations, fluid compatibility, administration routes, and timing of the medications to be used The patient's access for treatment and lifetime cumulative dose history, if applicable  The patient's medication allergies and previous infusion related reactions, if applicable   Changes made to treatment plan:  pre-medications    Follow up needed:  Pending authorization for treatment    Scott Harding, [place], 12/16/2020  8:40 AM

## 2020-12-18 ENCOUNTER — Ambulatory Visit
Admission: RE | Admit: 2020-12-18 | Discharge: 2020-12-18 | Disposition: A | Discharge status: Home / Self Care | Payer: 59 | Source: Ambulatory Visit | Attending: Radiation Oncology | Admitting: Radiation Oncology

## 2020-12-18 DIAGNOSIS — C321 Malignant neoplasm of supraglottis: Secondary | ICD-10-CM | POA: Insufficient documentation

## 2020-12-18 DIAGNOSIS — Z510 Encounter for antineoplastic radiation therapy: Secondary | ICD-10-CM | POA: Insufficient documentation

## 2020-12-19 ENCOUNTER — Other Ambulatory Visit: Payer: Self-pay | Admitting: Oncology

## 2020-12-19 ENCOUNTER — Telehealth: Payer: Self-pay

## 2020-12-19 DIAGNOSIS — C321 Malignant neoplasm of supraglottis: Secondary | ICD-10-CM

## 2020-12-19 NOTE — Progress Notes (Signed)
DISCONTINUE ON PATHWAY REGIMEN - Head and Neck     A cycle is every 21 days:     Cisplatin      Fluorouracil      Pembrolizumab   **Always confirm dose/schedule in your pharmacy ordering system**  REASON: Other Reason PRIOR TREATMENT: [id number]: Pembrolizumab 200 mg D1 + Cisplatin 100 mg/m2 D1 + Fluorouracil 1,000 mg/m2/day CIV D1-4 q21 Days x 6 Cycles, then Pembrolizumab 200 mg q21 Days for up to a total of 24 Months  START OFF PATHWAY REGIMEN - Head and Neck   [id number]:Carboplatin AUC=5 IV D1 + Paclitaxel 175 mg/m2 IV D1 + Pembrolizumab 200 mg IV D1 q21 Days:   A cycle is every 21 days:     Pembrolizumab      Paclitaxel      Carboplatin   **Always confirm dose/schedule in your pharmacy ordering system**  Patient Characteristics: Other Sites, Metastatic, First Line, No Prior Platinum-Based Chemoradiation within 6 Months, PD-L1 Expression Negative (CPS < 1) / Unknown Disease Classification: Other Sites AJCC 8 Stage Grouping: IVC Therapeutic Status: Metastatic Disease Line of Therapy: First Line Prior Platinum Status: No Prior Platinum-Based Chemoradiation within 6 Months PD-L1 Expression Status: Awaiting Test Results Intent of Therapy: Non-Curative / Palliative Intent, Discussed with Patient 

## 2020-12-19 NOTE — Telephone Encounter (Signed)
Request faxed to pathology to add PDL-1 testing to specimen [phone].

## 2020-12-19 NOTE — Progress Notes (Signed)
DISCONTINUE ON PATHWAY REGIMEN - Head and Neck     A cycle is every 7 days:     Cisplatin   **Always confirm dose/schedule in your pharmacy ordering system**  REASON: Other Reason PRIOR TREATMENT: [id number]: Cisplatin 40 mg/m2 q7 Days with Concurrent Radiation TREATMENT RESPONSE: Unable to Evaluate  START ON PATHWAY REGIMEN - Head and Neck     A cycle is every 21 days:     Cisplatin      Fluorouracil      Pembrolizumab   **Always confirm dose/schedule in your pharmacy ordering system**  Patient Characteristics: Other Sites, Metastatic, First Line, No Prior Platinum-Based Chemoradiation within 6 Months, PD-L1 Expression Negative (CPS < 1) / Unknown Disease Classification: Other Sites AJCC 8 Stage Grouping: IVC Therapeutic Status: Metastatic Disease Line of Therapy: First Line Prior Platinum Status: No Prior Platinum-Based Chemoradiation within 6 Months PD-L1 Expression Status: Awaiting Test Results Intent of Therapy: Curative Intent, Discussed with Patient

## 2020-12-19 NOTE — Progress Notes (Signed)
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  INTERVAL HISTORY: Patient returns to clinic today for further evaluation and treatment planning.  He recently had tracheostomy for airway progression due to large malignant mass developing in his larynx.  He does not complain of pain today.  He has no neurologic complaints.  He denies any recent fevers or illnesses. He has no chest pain, shortness of breath, cough, or hemoptysis.  He denies any nausea, vomiting, constipation, or diarrhea.  He has no urinary complaints.  Patient offers no further specific complaints today.  REVIEW OF SYSTEMS:   Review of Systems  Constitutional: Negative.  Negative for fever, malaise/fatigue and weight loss.  Respiratory: Negative.  Negative for cough, hemoptysis and shortness of breath.   Cardiovascular: Negative.  Negative for chest pain and leg swelling.  Gastrointestinal: Negative.  Negative for abdominal pain.  Genitourinary: Negative.  Negative for dysuria.  Musculoskeletal: Negative.  Negative for neck pain.  Skin: Negative.  Negative for rash.  Neurological: Negative.  Negative for dizziness, focal weakness, weakness and headaches.  Psychiatric/Behavioral: Negative.  The patient is not nervous/anxious.    As per HPI. Otherwise, a complete review of systems is negative.  PAST MEDICAL HISTORY: Past Medical History:  Diagnosis Date   Cancer [facility])    Coronary artery disease    Hypertension     PAST SURGICAL  HISTORY: Past Surgical History:  Procedure Laterality Date   CORONARY ARTERY BYPASS GRAFT  2010   Quadruple   DIRECT LARYNGOSCOPY  11/28/2020   Procedure: DIRECT LARYNGOSCOPY WITH BIOPSY;  Surgeon: Beverly Gust, MD;  Location: [facility] ORS;  Service: ENT;;   TRACHEOSTOMY TUBE PLACEMENT N/A 11/28/2020   Procedure: AWAKE TRACHEOSTOMY;  Surgeon: Beverly Gust, MD;  Location: [facility] ORS;  Service: ENT;  Laterality: N/A;    FAMILY HISTORY: Family History  Problem Relation Age of Onset   Arthritis Mother    Heart attack Father    Brain cancer Sister     ADVANCED DIRECTIVES (Y/N):  N  HEALTH MAINTENANCE: Social History   Tobacco Use   Smoking status: Former    Types: Cigarettes    Quit date: 10/17/2008    Years since quitting: 12.1   Smokeless tobacco: Never  Vaping Use   Vaping Use: Never used  Substance Use Topics   Alcohol use: Yes    Comment: occasionally   Drug use: Never     Colonoscopy:  PAP:  Bone density:  Lipid panel:  No Known Allergies  Current Outpatient Medications  Medication Sig Dispense Refill   ALPRAZolam (XANAX) 0.5 MG tablet Take 1 tablet (0.5 mg total) by mouth daily as needed for anxiety (Take 1 hour prior to radiation). 30 tablet 1   amLODipine (NORVASC) 10 MG tablet Take 1 tablet by mouth daily.     ascorbic acid (VITAMIN C) 500 MG tablet Take by mouth.     aspirin 81 MG EC tablet Take by mouth.     cyanocobalamin 1000 MCG tablet Take by mouth.     DENTA 5000 PLUS 1.1 % CREA dental cream BRUSH  WITH PASTE 2 3 TIMES PER DAY FOR AT LEAST 1 MINUTE     metoprolol succinate (TOPROL-XL) 25 MG 24 hr tablet Take 1 tablet (25 mg total) by mouth daily. 30 tablet 0   olmesartan-hydrochlorothiazide (BENICAR HCT) 40-12.5 MG tablet Take 1 tablet by mouth daily.     rosuvastatin (CRESTOR) 40 MG tablet Take 40 mg by mouth daily.     No current facility-administered medications for this visit.    OBJECTIVE: Vitals:   12/24/20 0846  BP: 115/75  Pulse: 74   Resp: 18  Temp: (!) 97.5 F (36.4 C)     Body mass index is 30.52 kg/m.    ECOG FS:0 - Asymptomatic  General: Well-developed, well-nourished, no acute distress. Eyes: Pink conjunctiva, anicteric sclera. HEENT: Normocephalic, moist mucous membranes.  Trach in place.  No erythema. Lungs: No audible wheezing or coughing. Heart: Regular rate and rhythm. Abdomen: Soft, nontender, no obvious distention. Musculoskeletal: No edema, cyanosis, or clubbing. Neuro: Alert, answering all questions appropriately. Cranial nerves grossly intact. Skin: No rashes or petechiae noted. Psych: Normal affect.   LAB RESULTS:  Lab Results  Component Value Date   NA 137 12/24/2020   K 3.2 (L) 12/24/2020   CL 106 12/24/2020   CO2 24 12/24/2020   GLUCOSE 137 (H) 12/24/2020   BUN 16 12/24/2020   CREATININE 1.25 (H) 12/24/2020   CALCIUM 8.9 12/24/2020   PROT 7.1 12/24/2020   ALBUMIN 4.0 12/24/2020   AST 16 12/24/2020   ALT 16 12/24/2020   ALKPHOS 41 12/24/2020   BILITOT 0.3 12/24/2020   GFRNONAA >60 12/24/2020   GFRAA 37 (L) 11/13/2019    Lab Results  Component Value Date   WBC 5.4 12/24/2020   NEUTROABS 4.0 12/24/2020   HGB 11.6 (L) 12/24/2020   HCT 35.5 (L) 12/24/2020   MCV 97.8 12/24/2020   PLT 201 12/24/2020     STUDIES: CT SOFT TISSUE NECK W CONTRAST  Result Date: 11/27/2020 CLINICAL DATA:  Laryngeal mass; history of left tonsillar cancer EXAM: CT NECK WITH CONTRAST TECHNIQUE: Multidetector CT imaging of the neck was performed using the standard protocol following the bolus administration of intravenous contrast. CONTRAST:  60mL OMNIPAQUE IOHEXOL 350 MG/ML SOLN COMPARISON:  CT neck 08/01/2019 FINDINGS: Pharynx and larynx: Nasopharynx is unremarkable. Oropharynx unremarkable without evidence of recurrent disease. There is thickening of the epiglottis. Additional soft tissue thickening involving the supraglottic larynx, hypopharynx, and larynx bilaterally. There is probable mild  subglottic extension. Laryngeal cartilages are similar in appearance. Due to the bulky exophytic nature of the soft tissue, airway is patent but narrowed. Salivary glands: Stable appearance of parotid and submandibular glands. Thyroid: Unremarkable. Lymph nodes: No substantial change in left level 2 node with some calcification measuring 1.8 cm on series 2, image 56. There are additional nonenlarged level 2 nodes that have increased in size compared to the prior study. Enlarged right level 5 and 3 nodes on series 2, image 73. The level 5 node measures 1.6 cm and the level 3 node measures 1.6 cm. Nonenlarged but asymmetric right supraclavicular node on series 2, image 90. Nonenlarged left level 2 node on series 2, image 59 has increased in size. Vascular: Major neck vessels are patent. Calcified plaque at the ICA origins, left greater than right. Limited intracranial: No abnormal enhancement. Visualized orbits: Unremarkable. Mastoids and visualized paranasal sinuses: Mild mucosal thickening. Mastoid air cells are clear. Skeleton: Degenerative changes of the cervical spine. Upper chest: No apical lung mass. Other: None. IMPRESSION: Bulky soft  tissue thickening involving larynx, supraglottic larynx, and hypopharynx suspicious for malignancy. Probable mild subglottic extension. Stable appearance of laryngeal cartilages that do not appear involved. Airway is patent but narrowed. Enlarged right level 3 and 5 nodes. Additional nonenlarged but indeterminate right level 2 and supraclavicular nodes and left level 2 node. Stable left level 2 node with mild calcification. No evidence of oropharyngeal recurrent disease. These results will be called to the ordering clinician or representative by the Radiologist Assistant, and communication documented in the PACS or [employer]. Electronically Signed   By: Macy Mis M.D.   On: 11/27/2020 16:42   NM PET Image Restag (PS) Skull Base To Thigh  Result Date:  12/17/2020 CLINICAL DATA:  Subsequent treatment strategy for supraglottic squamous cell carcinoma LEFT tonsil. EXAM: NUCLEAR MEDICINE PET SKULL BASE TO THIGH TECHNIQUE: 10.7 mCi F-18 FDG was injected intravenously. Full-ring PET imaging was performed from the skull base to thigh after the radiotracer. CT data was obtained and used for attenuation correction and anatomic localization. Fasting blood glucose: 76 mg/dl COMPARISON:  PET-CT scan 11/12/2019, 09/18/2018.  CT neck 11/27/2020 FINDINGS: Mediastinal blood pool activity: SUV max 1.9 Liver activity: SUV max NA NECK: Intensely hypermetabolic circumferential thickened tissue in the hypopharynx extending from the glottis superiorly to the base of tongue. The near entirety of the hypopharynx is involved by the hypermetabolic thickened tissue SUV max equal 22.6. Enlarged and hypermetabolic LEFT and RIGHT level II lymph nodes. Lymph nodes on the RIGHT are larger measuring 1.4 cm with SUV max equal 15.6 (image 56). Smaller hypermetabolic nodes LEFT level II with SUV max equal 5.3. There is intense metabolic activity associated with the tracheostomy tract. Incidental CT findings: none CHEST: Hypermetabolic RIGHT upper lobe pulmonary nodule measures 12 mm (image 100) with SUV max equal 8.2. Nodule in the RIGHT middle lobe measures 14 mm (image 123) with SUV max equal 7.2. Additional hypermetabolic nodules clustered in the central RIGHT lower lobe with SUV max equal 4.1 on image 117. Incidental CT findings: none ABDOMEN/PELVIS: No abnormal hypermetabolic activity within the liver, pancreas, adrenal glands, or spleen. No hypermetabolic lymph nodes in the abdomen or pelvis. Incidental CT findings: none SKELETON: No focal hypermetabolic activity to suggest skeletal metastasis. Incidental CT findings: none IMPRESSION: 1. Intensely hypermetabolic and thickened circumferential tissue in the hypopharynx extending from the base of the tongue to the glottis most = consistent with  squamous cell carcinoma recurrence. 2. Bilateral hypermetabolic level II  nodal metastasis. 3. Multifocal hypermetabolic nodular metastasis in the RIGHT lung. 4. Hypermetabolic activity along the tracheostomy tract with differential including inflammation versus malignancy. Electronically Signed   By: Suzy Bouchard M.D.   On: 12/17/2020 14:31   DG Chest Port 1 View  Result Date: 11/28/2020 CLINICAL DATA:  Post tracheostomy. EXAM: PORTABLE CHEST 1 VIEW COMPARISON:  No recent exams.  Head CT 11/12/2019 reviewed FINDINGS: Tracheostomy tube tip projects over the thoracic inlet in the midline. Patient is post median sternotomy and CABG. Cardiomegaly. Low lung volumes. No pneumothorax or pleural effusion. No confluent airspace disease. No acute osseous abnormalities are seen. IMPRESSION: 1. Tracheostomy tube tip projects over the thoracic inlet in the midline. 2. Cardiomegaly. Low lung volumes. Electronically Signed   By: Keith Rake M.D.   On: 11/28/2020 14:45   ECHOCARDIOGRAM COMPLETE  Result Date: 11/29/2020    ECHOCARDIOGRAM REPORT   Patient Name:   JERIMY JOHANSON Date of Exam: 11/29/2020 Medical Rec #:  [id number]     Height:  69.0 in Accession #:    [id number]    Weight:       206.3 lb Date of Birth:  11-15-1955    BSA:          2.094 m Patient Age:    27 years      BP:           109/64 mmHg Patient Gender: M             HR:           63 bpm. Exam Location:  [facility] Procedure: 2D Echo Indications:     Cardiomyopathy  History:         Patient has no prior history of Echocardiogram examinations.                  CAD; Risk Factors:Former Smoker and Hypertension.  Sonographer:     L Thornton-Maynard Referring Phys:  [id number] CARMEN L GONZALEZ Diagnosing Phys: Kathlyn Sacramento MD IMPRESSIONS  1. Left ventricular ejection fraction, by estimation, is 50 to 55%. The left ventricle has low normal function. Left ventricular endocardial border not optimally defined to evaluate regional wall motion. Left ventricular  diastolic parameters were normal.  2. Right ventricular systolic function is normal. The right ventricular size is normal. Tricuspid regurgitation signal is inadequate for assessing PA pressure.  3. The mitral valve is normal in structure. Mild mitral valve regurgitation. No evidence of mitral stenosis.  4. The aortic valve is normal in structure. Aortic valve regurgitation is trivial. No aortic stenosis is present.  5. The inferior vena cava is normal in size with greater than 50% respiratory variability, suggesting right atrial pressure of 3 mmHg. FINDINGS  Left Ventricle: Left ventricular ejection fraction, by estimation, is 50 to 55%. The left ventricle has low normal function. Left ventricular endocardial border not optimally defined to evaluate regional wall motion. The left ventricular internal cavity  size was normal in size. There is no left ventricular hypertrophy. Left ventricular diastolic parameters were normal. Right Ventricle: The right ventricular size is normal. No increase in right ventricular wall thickness. Right ventricular systolic function is normal. Tricuspid regurgitation signal is inadequate for assessing PA pressure. The tricuspid regurgitant velocity is 2.16 m/s, and with an assumed right atrial pressure of 3 mmHg, the estimated right ventricular systolic pressure is 02.5 mmHg. Left Atrium: Left atrial size was normal in size. Right Atrium: Right atrial size was normal in size. Pericardium: There is no evidence of pericardial effusion. Mitral Valve: The mitral valve is normal in structure. Mild mitral valve regurgitation. No evidence of mitral valve stenosis. Tricuspid Valve: The tricuspid valve is normal in structure. Tricuspid valve regurgitation is trivial. No evidence of tricuspid stenosis. Aortic Valve: The aortic valve is normal in structure. Aortic valve regurgitation is trivial. No aortic stenosis is present. Aortic valve mean gradient measures 5.0 mmHg. Aortic valve peak gradient  measures 9.5 mmHg. Aortic valve area, by VTI measures 1.68 cm. Pulmonic Valve: The pulmonic valve was normal in structure. Pulmonic valve regurgitation is mild. No evidence of pulmonic stenosis. Aorta: The aortic root is normal in size and structure. Venous: The inferior vena cava is normal in size with greater than 50% respiratory variability, suggesting right atrial pressure of 3 mmHg. IAS/Shunts: No atrial level shunt detected by color flow Doppler.  LEFT VENTRICLE PLAX 2D LVIDd:         4.99 cm     Diastology LVIDs:         3.79 cm  LV e' medial:    5.33 cm/s LV PW:         0.90 cm     LV E/e' medial:  15.8 LV IVS:        0.98 cm     LV e' lateral:   9.03 cm/s LVOT diam:     1.90 cm     LV E/e' lateral: 9.3 LV SV:         57 LV SV Index:   27 LVOT Area:     2.84 cm  LV Volumes (MOD) LV vol d, MOD A2C: 57.4 ml LV vol d, MOD A4C: 72.7 ml LV vol s, MOD A2C: 31.3 ml LV vol s, MOD A4C: 36.6 ml LV SV MOD A2C:     26.1 ml LV SV MOD A4C:     72.7 ml LV SV MOD BP:      32.7 ml RIGHT VENTRICLE RV S prime:     10.80 cm/s TAPSE (M-mode): 1.4 cm LEFT ATRIUM             Index LA diam:        3.60 cm 1.72 cm/m LA Vol (A2C):   38.7 ml 18.48 ml/m LA Vol (A4C):   44.0 ml 21.02 ml/m LA Biplane Vol: 43.0 ml 20.54 ml/m  AORTIC VALVE                    PULMONIC VALVE AV Area (Vmax):    1.65 cm     PV Vmax:          0.95 m/s AV Area (Vmean):   1.80 cm     PV Peak grad:     3.6 mmHg AV Area (VTI):     1.68 cm     PR End Diast Vel: 5.86 msec AV Vmax:           154.00 cm/s AV Vmean:          103.000 cm/s AV VTI:            0.338 m AV Peak Grad:      9.5 mmHg AV Mean Grad:      5.0 mmHg LVOT Vmax:         89.40 cm/s LVOT Vmean:        65.500 cm/s LVOT VTI:          0.200 m LVOT/AV VTI ratio: 0.59  AORTA Ao Root diam: 3.20 cm MITRAL VALVE               TRICUSPID VALVE MV Area (PHT): 3.53 cm    TR Peak grad:   18.7 mmHg MV Decel Time: 215 msec    TR Vmax:        216.00 cm/s MV E velocity: 84.40 cm/s MV A velocity: 82.30 cm/s   SHUNTS MV E/A ratio:  1.03        Systemic VTI:  0.20 m                            Systemic Diam: 1.90 cm Kathlyn Sacramento MD Electronically signed by Kathlyn Sacramento MD Signature Date/Time: 11/29/2020/8:32:18 PM    Final     ASSESSMENT: 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   PLAN:    1.  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  Both malignancies are p16 positive.  Patient has had an emergency trach placed secondary to airway protection.  PET scan results from December 16, 2020 reviewed independently with 3 metastatic lesions in patient's lung.  Case discussed with surgery and radiation oncology and it was determined to proceed with systemic chemotherapy using carboplatinum, Taxol, and Keytruda on day 1 with carboplatinum and Taxol only on day 8.  This will be a 21-day cycle.  Patient will require port placement in the near future.  Return to clinic in 1 week to initiate cycle 1, day 1 of treatment.   2.  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  PET scan results from November 12, 2019 reviewed independently with no obvious evidence of recurrent or progressive disease.  Level 2 lymph node is no longer hypermetabolic.  3.  Renal insufficiency: Creatinine has improved to 1.25.  Avoid cisplatin with treatment regimen. 4.  Anemia: Mild, monitor. 5.  Hypokalemia: Mild, monitor.   Patient expressed understanding and was in agreement with this plan. He also understands that He can call clinic at any time with any questions, concerns, or complaints.   Cancer Staging Malignant neoplasm of supraglottis [facility]) Staging form: Larynx - Supraglottis, AJCC 8th Edition - Clinical stage from 12/11/2020: Stage IVA (cT3, cN2b, cM0) - Signed by  Lloyd Huger, MD on 12/11/2020  Squamous cell carcinoma of left tonsil ([facility]) Staging form: Pharynx - HPV-Mediated Oropharynx, AJCC 8th Edition - Clinical: No stage assigned - Unsigned   Lloyd Huger, MD   12/25/2020 11:26 AM

## 2020-12-23 ENCOUNTER — Other Ambulatory Visit: Payer: Self-pay | Admitting: *Deleted

## 2020-12-23 DIAGNOSIS — C321 Malignant neoplasm of supraglottis: Secondary | ICD-10-CM | POA: Diagnosis not present

## 2020-12-23 MED ORDER — ALPRAZOLAM 0.5 MG PO TABS: 0.5000 mg | ORAL_TABLET | Freq: Every day | ORAL | 1 refills | Status: AC | PRN

## 2020-12-24 ENCOUNTER — Inpatient Hospital Stay: Payer: 59 | Attending: Oncology

## 2020-12-24 ENCOUNTER — Inpatient Hospital Stay: Payer: 59

## 2020-12-24 ENCOUNTER — Encounter: Payer: Self-pay | Admitting: Oncology

## 2020-12-24 ENCOUNTER — Inpatient Hospital Stay (HOSPITAL_BASED_OUTPATIENT_CLINIC_OR_DEPARTMENT_OTHER): Payer: 59 | Admitting: Oncology

## 2020-12-24 ENCOUNTER — Other Ambulatory Visit: Payer: Self-pay

## 2020-12-24 ENCOUNTER — Telehealth: Payer: Self-pay

## 2020-12-24 VITALS — BP 115/75 | HR 74 | Temp 97.5°F | Resp 18 | Wt 206.7 lb

## 2020-12-24 DIAGNOSIS — Z79899 Other long term (current) drug therapy: Secondary | ICD-10-CM | POA: Insufficient documentation

## 2020-12-24 DIAGNOSIS — E876 Hypokalemia: Secondary | ICD-10-CM | POA: Insufficient documentation

## 2020-12-24 DIAGNOSIS — Z930 Tracheostomy status: Secondary | ICD-10-CM | POA: Insufficient documentation

## 2020-12-24 DIAGNOSIS — D649 Anemia, unspecified: Secondary | ICD-10-CM | POA: Insufficient documentation

## 2020-12-24 DIAGNOSIS — C321 Malignant neoplasm of supraglottis: Secondary | ICD-10-CM | POA: Diagnosis present

## 2020-12-24 DIAGNOSIS — Z5112 Encounter for antineoplastic immunotherapy: Secondary | ICD-10-CM | POA: Diagnosis present

## 2020-12-24 DIAGNOSIS — C099 Malignant neoplasm of tonsil, unspecified: Secondary | ICD-10-CM | POA: Diagnosis not present

## 2020-12-24 DIAGNOSIS — N189 Chronic kidney disease, unspecified: Secondary | ICD-10-CM | POA: Insufficient documentation

## 2020-12-24 DIAGNOSIS — Z5111 Encounter for antineoplastic chemotherapy: Secondary | ICD-10-CM | POA: Insufficient documentation

## 2020-12-24 DIAGNOSIS — Z87891 Personal history of nicotine dependence: Secondary | ICD-10-CM | POA: Insufficient documentation

## 2020-12-24 LAB — CBC WITH DIFFERENTIAL/PLATELET
Abs Immature Granulocytes: 0.02 10*3/uL (ref 0.00–0.07)
Basophils Absolute: 0 10*3/uL (ref 0.0–0.1)
Basophils Relative: 1 %
Eosinophils Absolute: 0.2 10*3/uL (ref 0.0–0.5)
Eosinophils Relative: 4 %
HCT: 35.5 % — ABNORMAL LOW (ref 39.0–52.0)
Hemoglobin: 11.6 g/dL — ABNORMAL LOW (ref 13.0–17.0)
Immature Granulocytes: 0 %
Lymphocytes Relative: 14 %
Lymphs Abs: 0.7 10*3/uL (ref 0.7–4.0)
MCH: 32 pg (ref 26.0–34.0)
MCHC: 32.7 g/dL (ref 30.0–36.0)
MCV: 97.8 fL (ref 80.0–100.0)
Monocytes Absolute: 0.5 10*3/uL (ref 0.1–1.0)
Monocytes Relative: 9 %
Neutro Abs: 4 10*3/uL (ref 1.7–7.7)
Neutrophils Relative %: 72 %
Platelets: 201 10*3/uL (ref 150–400)
RBC: 3.63 MIL/uL — ABNORMAL LOW (ref 4.22–5.81)
RDW: 13.6 % (ref 11.5–15.5)
WBC: 5.4 10*3/uL (ref 4.0–10.5)
nRBC: 0 % (ref 0.0–0.2)

## 2020-12-24 LAB — COMPREHENSIVE METABOLIC PANEL
ALT: 16 U/L (ref 0–44)
AST: 16 U/L (ref 15–41)
Albumin: 4 g/dL (ref 3.5–5.0)
Alkaline Phosphatase: 41 U/L (ref 38–126)
Anion gap: 7 (ref 5–15)
BUN: 16 mg/dL (ref 8–23)
CO2: 24 mmol/L (ref 22–32)
Calcium: 8.9 mg/dL (ref 8.9–10.3)
Chloride: 106 mmol/L (ref 98–111)
Creatinine, Ser: 1.25 mg/dL — ABNORMAL HIGH (ref 0.61–1.24)
GFR, Estimated: >60 mL/min (ref >60)
Glucose, Bld: 137 mg/dL — ABNORMAL HIGH (ref 70–99)
Potassium: 3.2 mmol/L — ABNORMAL LOW (ref 3.5–5.1)
Sodium: 137 mmol/L (ref 135–145)
Total Bilirubin: 0.3 mg/dL (ref 0.3–1.2)
Total Protein: 7.1 g/dL (ref 6.5–8.1)

## 2020-12-24 LAB — TSH: TSH: 1.518 u[IU]/mL (ref 0.350–4.500)

## 2020-12-24 NOTE — Telephone Encounter (Signed)
Port a cath placement order entered and request sent to specialty scheduling for port to be placed by IR.  

## 2020-12-24 NOTE — Progress Notes (Signed)
Patient denies new problems/concerns today.   °

## 2020-12-25 ENCOUNTER — Encounter: Payer: Self-pay | Admitting: Oncology

## 2020-12-25 ENCOUNTER — Ambulatory Visit: Payer: 59

## 2020-12-25 LAB — T4: T4, Total: 6.3 ug/dL (ref 4.5–12.0)

## 2020-12-25 NOTE — Telephone Encounter (Signed)
Patient daughter Estill Bamberg is notified of appointment.

## 2020-12-25 NOTE — Telephone Encounter (Signed)
Patient is scheduled for port placement on 01/01/21 arrive at 8:30 for 9:30 appointment.

## 2020-12-26 NOTE — Telephone Encounter (Deleted)
Confirmed with pathology that specimen has been submitted for PDL-1 testing.

## 2020-12-27 NOTE — Progress Notes (Signed)
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  INTERVAL HISTORY: Patient returns to clinic today for further evaluation and consideration of cycle 1, day 1 of carboplatinum, Taxol, and Keytruda.  He does not complain of pain today.  He has no neurologic complaints.  He denies any recent fevers or illnesses. He has no chest pain, shortness of breath, cough, or hemoptysis.  He denies any nausea, vomiting, constipation, or diarrhea.  He has no urinary complaints.  Patient offers no further specific complaints today.  REVIEW OF SYSTEMS:   Review of Systems  Constitutional: Negative.  Negative for fever, malaise/fatigue and weight loss.  Respiratory: Negative.  Negative for cough, hemoptysis and shortness of breath.   Cardiovascular: Negative.  Negative for chest pain and leg swelling.  Gastrointestinal: Negative.  Negative for abdominal pain.  Genitourinary: Negative.  Negative for dysuria.  Musculoskeletal: Negative.  Negative for neck pain.  Skin: Negative.  Negative for rash.  Neurological: Negative.  Negative for dizziness, focal weakness, weakness and headaches.  Psychiatric/Behavioral: Negative.  The patient is not nervous/anxious.    As per HPI. Otherwise, a complete review of systems is negative.  PAST MEDICAL HISTORY: Past Medical History:  Diagnosis Date   Cancer [facility])    Coronary artery disease    Hypertension     PAST SURGICAL HISTORY: Past Surgical History:  Procedure Laterality  Date   CORONARY ARTERY BYPASS GRAFT  2010   Quadruple   DIRECT LARYNGOSCOPY  11/28/2020   Procedure: DIRECT LARYNGOSCOPY WITH BIOPSY;  Surgeon: Beverly Gust, MD;  Location: [facility] ORS;  Service: ENT;;   TRACHEOSTOMY TUBE PLACEMENT N/A 11/28/2020   Procedure: AWAKE TRACHEOSTOMY;  Surgeon: Beverly Gust, MD;  Location: [facility] ORS;  Service: ENT;  Laterality: N/A;    FAMILY HISTORY: Family History  Problem Relation Age of Onset   Arthritis Mother    Heart attack Father    Brain cancer Sister     ADVANCED DIRECTIVES (Y/N):  N  HEALTH MAINTENANCE: Social History   Tobacco Use   Smoking status: Former    Types: Cigarettes    Quit date: 10/17/2008    Years since quitting: 12.2   Smokeless tobacco: Never  Vaping Use   Vaping Use: Never used  Substance Use Topics   Alcohol use: Yes    Comment: occasionally   Drug use: Never     Colonoscopy:  PAP:  Bone density:  Lipid panel:  No Known Allergies  Current Outpatient Medications  Medication Sig Dispense Refill   ALPRAZolam (XANAX) 0.5 MG tablet Take 1 tablet (0.5 mg total) by mouth daily as needed for anxiety (Take 1 hour prior to radiation). 30 tablet 1   amLODipine (NORVASC) 10 MG tablet Take 1 tablet by mouth daily.     ascorbic acid (VITAMIN C) 500 MG tablet Take by mouth.     aspirin 81 MG EC tablet Take by mouth.     cyanocobalamin 1000 MCG tablet Take by mouth.     DENTA 5000 PLUS 1.1 % CREA dental cream BRUSH WITH PASTE 2 3 TIMES PER DAY FOR  AT LEAST 1 MINUTE     lidocaine-prilocaine (EMLA) cream Apply 1 application topically as needed. 30 g 0   metoprolol succinate (TOPROL-XL) 25 MG 24 hr tablet Take 1 tablet (25 mg total) by mouth daily. 30 tablet 0   olmesartan-hydrochlorothiazide (BENICAR HCT) 40-12.5 MG tablet Take 1 tablet by mouth daily.     ondansetron (ZOFRAN) 8 MG tablet Take 1 tablet (8 mg total) by mouth every 8 (eight) hours as needed for nausea or vomiting. 60 tablet 2   prochlorperazine (COMPAZINE) 10  MG tablet Take 1 tablet (10 mg total) by mouth every 6 (six) hours as needed for nausea or vomiting. 60 tablet 2   rosuvastatin (CRESTOR) 40 MG tablet Take 40 mg by mouth daily.     No current facility-administered medications for this visit.   Facility-Administered Medications Ordered in Other Visits  Medication Dose Route Frequency Provider Last Rate Last Admin   CARBOplatin (PARAPLATIN) 200 mg in sodium chloride 0.9 % 100 mL chemo infusion  200 mg Intravenous Once Lloyd Huger, MD       dexamethasone (DECADRON) 10 mg in sodium chloride 0.9 % 50 mL IVPB  10 mg Intravenous Once Lloyd Huger, MD 204 mL/hr at 12/30/20 1132 10 mg at 12/30/20 1132   fosaprepitant (EMEND) 150 mg in sodium chloride 0.9 % 145 mL IVPB  150 mg Intravenous Once Lloyd Huger, MD 450 mL/hr at 12/30/20 1134 150 mg at 12/30/20 1134   heparin lock flush 100 unit/mL  500 Units Intracatheter Once PRN Lloyd Huger, MD       PACLitaxel (TAXOL) 168 mg in sodium chloride 0.9 % 250 mL chemo infusion (> 80mg /m2)  80 mg/m2 (Treatment Plan Recorded) Intravenous Once Lloyd Huger, MD       pembrolizumab [employer]) 200 mg in sodium chloride 0.9 % 50 mL chemo infusion  200 mg Intravenous Once Lloyd Huger, MD       sodium chloride flush (NS) 0.9 % injection 10 mL  10 mL Intracatheter PRN Lloyd Huger, MD        OBJECTIVE: Vitals:   12/30/20 0935  BP: 111/60  Pulse: 67  Resp: 18  Temp: 98.1 F (36.7 C)  SpO2: 99%     Body mass index is 30.3 kg/m.    ECOG FS:0 - Asymptomatic  General: Well-developed, well-nourished, no acute distress. Eyes: Pink conjunctiva, anicteric sclera. HEENT: Normocephalic, moist mucous membranes.  Trach in place. Lungs: No audible wheezing or coughing. Heart: Regular rate and rhythm. Abdomen: Soft, nontender, no obvious distention. Musculoskeletal: No edema, cyanosis, or clubbing. Neuro: Alert, answering all questions appropriately. Cranial nerves  grossly intact. Skin: No rashes or petechiae noted. Psych: Normal affect.   LAB RESULTS:  Lab Results  Component Value Date   NA 139 12/30/2020   K 3.5 12/30/2020   CL 104 12/30/2020   CO2 25 12/30/2020   GLUCOSE 93 12/30/2020   BUN 22 12/30/2020   CREATININE 1.30 (H) 12/30/2020   CALCIUM 8.8 (L) 12/30/2020   PROT 7.3 12/30/2020   ALBUMIN 4.0 12/30/2020   AST 15 12/30/2020   ALT 20 12/30/2020   ALKPHOS 39 12/30/2020   BILITOT 0.5 12/30/2020   GFRNONAA >60 12/30/2020   GFRAA 37 (L) 11/13/2019    Lab Results  Component Value Date   WBC 5.8 12/30/2020   NEUTROABS 4.4 12/30/2020   HGB 11.8 (L) 12/30/2020   HCT 36.5 (L) 12/30/2020   MCV 98.4 12/30/2020   PLT 225 12/30/2020  STUDIES: NM PET Image Restag (PS) Skull Base To Thigh  Result Date: 12/17/2020 CLINICAL DATA:  Subsequent treatment strategy for supraglottic squamous cell carcinoma LEFT tonsil. EXAM: NUCLEAR MEDICINE PET SKULL BASE TO THIGH TECHNIQUE: 10.7 mCi F-18 FDG was injected intravenously. Full-ring PET imaging was performed from the skull base to thigh after the radiotracer. CT data was obtained and used for attenuation correction and anatomic localization. Fasting blood glucose: 76 mg/dl COMPARISON:  PET-CT scan 11/12/2019, 09/18/2018.  CT neck 11/27/2020 FINDINGS: Mediastinal blood pool activity: SUV max 1.9 Liver activity: SUV max NA NECK: Intensely hypermetabolic circumferential thickened tissue in the hypopharynx extending from the glottis superiorly to the base of tongue. The near entirety of the hypopharynx is involved by the hypermetabolic thickened tissue SUV max equal 22.6. Enlarged and hypermetabolic LEFT and RIGHT level II lymph nodes. Lymph nodes on the RIGHT are larger measuring 1.4 cm with SUV max equal 15.6 (image 56). Smaller hypermetabolic nodes LEFT level II with SUV max equal 5.3. There is intense metabolic activity associated with the tracheostomy tract. Incidental CT findings: none CHEST:  Hypermetabolic RIGHT upper lobe pulmonary nodule measures 12 mm (image 100) with SUV max equal 8.2. Nodule in the RIGHT middle lobe measures 14 mm (image 123) with SUV max equal 7.2. Additional hypermetabolic nodules clustered in the central RIGHT lower lobe with SUV max equal 4.1 on image 117. Incidental CT findings: none ABDOMEN/PELVIS: No abnormal hypermetabolic activity within the liver, pancreas, adrenal glands, or spleen. No hypermetabolic lymph nodes in the abdomen or pelvis. Incidental CT findings: none SKELETON: No focal hypermetabolic activity to suggest skeletal metastasis. Incidental CT findings: none IMPRESSION: 1. Intensely hypermetabolic and thickened circumferential tissue in the hypopharynx extending from the base of the tongue to the glottis most = consistent with squamous cell carcinoma recurrence. 2. Bilateral hypermetabolic level II  nodal metastasis. 3. Multifocal hypermetabolic nodular metastasis in the RIGHT lung. 4. Hypermetabolic activity along the tracheostomy tract with differential including inflammation versus malignancy. Electronically Signed   By: Suzy Bouchard M.D.   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   PLAN:    1.  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  Both malignancies are p16 positive.  Patient has had an emergency trach placed secondary to airway protection.  PET scan results from December 16, 2020 reviewed independently with 3 metastatic lesions in patient's lung.  Case discussed with surgery and radiation oncology and it was determined to proceed with systemic chemotherapy using carboplatinum, Taxol, and Keytruda on day 1 with carboplatinum and Taxol only on day 8.  This will be a 21-day cycle.  Patient had port placement yesterday.   Proceed with cycle 1, day 1 of treatment today.  Return to clinic in 1 week for further evaluation and consideration of cycle 1, day 8 which is carboplatinum and Taxol only.     2.  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  PET scan results from November 12, 2019 reviewed independently with no obvious evidence of recurrent or progressive disease.  Level 2 lymph node is no longer hypermetabolic.  3.  Renal insufficiency: Chronic and unchanged.  Patient's creatinine is 1.3 today.   4.  Anemia: Chronic and  unchanged.  Monitor. 5.  Hypokalemia: Resolved.   Patient expressed understanding and was in agreement with this plan. He also understands that He can call clinic at any time with any questions, concerns, or complaints.   Cancer Staging Malignant neoplasm of supraglottis [facility]) Staging form: Larynx - Supraglottis, AJCC 8th Edition - Clinical stage from 12/11/2020: Stage IVA (cT3, cN2b, cM0) - Signed by Lloyd Huger, MD on 12/11/2020  Squamous cell carcinoma of left tonsil ([place]) Staging form: Pharynx - HPV-Mediated Oropharynx, AJCC 8th Edition - Clinical: No stage assigned - Unsigned   Lloyd Huger, MD   12/30/2020 11:35 AM

## 2020-12-29 ENCOUNTER — Ambulatory Visit: Payer: 59

## 2020-12-29 ENCOUNTER — Other Ambulatory Visit: Payer: Self-pay | Admitting: Radiology

## 2020-12-30 ENCOUNTER — Ambulatory Visit: Payer: 59

## 2020-12-30 ENCOUNTER — Inpatient Hospital Stay: Payer: 59

## 2020-12-30 ENCOUNTER — Inpatient Hospital Stay (HOSPITAL_BASED_OUTPATIENT_CLINIC_OR_DEPARTMENT_OTHER): Payer: 59 | Admitting: Oncology

## 2020-12-30 VITALS — BP 123/67 | HR 57

## 2020-12-30 VITALS — BP 111/60 | HR 67 | Temp 98.1°F | Resp 18 | Wt 205.2 lb

## 2020-12-30 DIAGNOSIS — C321 Malignant neoplasm of supraglottis: Secondary | ICD-10-CM | POA: Diagnosis not present

## 2020-12-30 DIAGNOSIS — Z5112 Encounter for antineoplastic immunotherapy: Secondary | ICD-10-CM | POA: Diagnosis not present

## 2020-12-30 LAB — CBC WITH DIFFERENTIAL/PLATELET
Abs Immature Granulocytes: 0.01 10*3/uL (ref 0.00–0.07)
Basophils Absolute: 0 10*3/uL (ref 0.0–0.1)
Basophils Relative: 0 %
Eosinophils Absolute: 0.2 10*3/uL (ref 0.0–0.5)
Eosinophils Relative: 4 %
HCT: 36.5 % — ABNORMAL LOW (ref 39.0–52.0)
Hemoglobin: 11.8 g/dL — ABNORMAL LOW (ref 13.0–17.0)
Immature Granulocytes: 0 %
Lymphocytes Relative: 13 %
Lymphs Abs: 0.8 10*3/uL (ref 0.7–4.0)
MCH: 31.8 pg (ref 26.0–34.0)
MCHC: 32.3 g/dL (ref 30.0–36.0)
MCV: 98.4 fL (ref 80.0–100.0)
Monocytes Absolute: 0.5 10*3/uL (ref 0.1–1.0)
Monocytes Relative: 8 %
Neutro Abs: 4.4 10*3/uL (ref 1.7–7.7)
Neutrophils Relative %: 75 %
Platelets: 225 10*3/uL (ref 150–400)
RBC: 3.71 MIL/uL — ABNORMAL LOW (ref 4.22–5.81)
RDW: 14 % (ref 11.5–15.5)
WBC: 5.8 10*3/uL (ref 4.0–10.5)
nRBC: 0 % (ref 0.0–0.2)

## 2020-12-30 LAB — COMPREHENSIVE METABOLIC PANEL
ALT: 20 U/L (ref 0–44)
AST: 15 U/L (ref 15–41)
Albumin: 4 g/dL (ref 3.5–5.0)
Alkaline Phosphatase: 39 U/L (ref 38–126)
Anion gap: 10 (ref 5–15)
BUN: 22 mg/dL (ref 8–23)
CO2: 25 mmol/L (ref 22–32)
Calcium: 8.8 mg/dL — ABNORMAL LOW (ref 8.9–10.3)
Chloride: 104 mmol/L (ref 98–111)
Creatinine, Ser: 1.3 mg/dL — ABNORMAL HIGH (ref 0.61–1.24)
GFR, Estimated: >60 mL/min (ref >60)
Glucose, Bld: 93 mg/dL (ref 70–99)
Potassium: 3.5 mmol/L (ref 3.5–5.1)
Sodium: 139 mmol/L (ref 135–145)
Total Bilirubin: 0.5 mg/dL (ref 0.3–1.2)
Total Protein: 7.3 g/dL (ref 6.5–8.1)

## 2020-12-30 LAB — TSH: TSH: 1.3 u[IU]/mL (ref 0.350–4.500)

## 2020-12-30 MED ORDER — HEPARIN SOD (PORK) LOCK FLUSH 100 UNIT/ML IV SOLN
500.0000 [IU] | Freq: Once | INTRAVENOUS | Status: DC | PRN
  Filled 2020-12-30: qty 5

## 2020-12-30 MED ORDER — SODIUM CHLORIDE 0.9 % IV SOLN
10.0000 mg | Freq: Once | INTRAVENOUS | Status: AC
  Administered 2020-12-30: 10 mg via INTRAVENOUS
  Filled 2020-12-30: qty 10

## 2020-12-30 MED ORDER — SODIUM CHLORIDE 0.9 % IV SOLN
Freq: Once | INTRAVENOUS | Status: AC
  Filled 2020-12-30: qty 250

## 2020-12-30 MED ORDER — SODIUM CHLORIDE 0.9% FLUSH
10.0000 mL | INTRAVENOUS | Status: DC | PRN
  Filled 2020-12-30: qty 10

## 2020-12-30 MED ORDER — LIDOCAINE-PRILOCAINE 2.5-2.5 % EX CREA: 1.0000 "application " | TOPICAL_CREAM | CUTANEOUS | 0 refills | Status: DC | PRN

## 2020-12-30 MED ORDER — PROCHLORPERAZINE MALEATE 10 MG PO TABS: 10.0000 mg | ORAL_TABLET | Freq: Four times a day (QID) | ORAL | 2 refills | Status: DC | PRN

## 2020-12-30 MED ORDER — ONDANSETRON HCL 8 MG PO TABS: 8.0000 mg | ORAL_TABLET | Freq: Three times a day (TID) | ORAL | 2 refills | Status: DC | PRN

## 2020-12-30 MED ORDER — DIPHENHYDRAMINE HCL 50 MG/ML IJ SOLN
25.0000 mg | Freq: Once | INTRAMUSCULAR | Status: AC
  Administered 2020-12-30: 25 mg via INTRAVENOUS
  Filled 2020-12-30: qty 1

## 2020-12-30 MED ORDER — SODIUM CHLORIDE 0.9 % IV SOLN
200.0000 mg | Freq: Once | INTRAVENOUS | Status: AC
  Administered 2020-12-30: 200 mg via INTRAVENOUS
  Filled 2020-12-30: qty 8

## 2020-12-30 MED ORDER — SODIUM CHLORIDE 0.9 % IV SOLN
80.0000 mg/m2 | Freq: Once | INTRAVENOUS | Status: AC
  Administered 2020-12-30: 168 mg via INTRAVENOUS
  Filled 2020-12-30: qty 28

## 2020-12-30 MED ORDER — SODIUM CHLORIDE 0.9 % IV SOLN
201.2000 mg | Freq: Once | INTRAVENOUS | Status: DC
  Filled 2020-12-30: qty 20

## 2020-12-30 MED ORDER — FAMOTIDINE 20 MG IN NS 100 ML IVPB
20.0000 mg | Freq: Once | INTRAVENOUS | Status: AC
  Administered 2020-12-30: 20 mg via INTRAVENOUS
  Filled 2020-12-30: qty 100
  Filled 2020-12-30: qty 20

## 2020-12-30 MED ORDER — SODIUM CHLORIDE 0.9 % IV SOLN
150.0000 mg | Freq: Once | INTRAVENOUS | Status: AC
  Administered 2020-12-30: 150 mg via INTRAVENOUS
  Filled 2020-12-30: qty 150

## 2020-12-30 MED ORDER — PALONOSETRON HCL INJECTION 0.25 MG/5ML
0.2500 mg | Freq: Once | INTRAVENOUS | Status: AC
  Administered 2020-12-30: 0.25 mg via INTRAVENOUS
  Filled 2020-12-30: qty 5

## 2020-12-30 NOTE — Progress Notes (Addendum)
Nutrition Assessment   Reason for Assessment:   RD screen, supraglottic cancer stage IV, p 16 + starting treatment   ASSESSMENT:  65 year old male with history of stage IV squamous cell carcinoma of left tonsil, p 16 +, now with second primary in supraglottis stage IV, p 16 +.  Past medical history of CAD, HTN.  Patient starting carboplatinum, taxol and [place].  S/p emergent tracheostomy on 8/12 due to airway narrowing.    Met with patient during infusion.  Patient seen by SLP during hospital admission and noted recommendations for puree with nectar thick liquids.  Patient spoke at whisper during visit and says he has been eating soft foods from [employer] and doing nectar thick liquids.  Patient lives alone.  Daughter is available to help him and he requests that RD call her.  Does not sound like he has had follow-up with SLP since hospital discharge.     Medications: Vit C, Vit B 12   Labs: K 3.2, glucose 137 Creatinine 1.25   Anthropometrics:   Height: 69 inches Weight: 205 lb 3.2 oz today 217 lb on 05/20/20  BMI: 30  6% weight loss in the last 7 months   Estimated Energy Needs  Kcals: 2300-2700 Protein: 115-135 g Fluid: 2.3 L   NUTRITION DIAGNOSIS: Inadequate oral intake related to cancer, cancer location and cancer related treatment side effects as evidenced by weight loss and dysphagia effecting intake   INTERVENTION:  Provided patient with samples of vital cusine 500 calorie shake to try.   Called and spoke with daughter.  Patient is picky eater and does not like gravy or a lot of butters or milk products. Says that milk products cause stomach upset. Informed daughter that shake samples given earlier today have milk in them.  Patient did not tell RD that milk upset his stomach when shown the samples.  Has tried ensure mixed with pudding and able to tolerate.  Discussed that ensure would need to be thickened if drank alone.  Daughter says that patient will not eat  puree foods. Gets soft foods from mostly [employer].   RD will notify SLP that covers cancer center of patient for follow-up.   Discussed importance of high calorie, high protein foods.      MONITORING, EVALUATION, GOAL: weight trends, intake   Next Visit: Tuesday, Sept 20 during infusion  Alyviah Crandle B. Zenia Resides, [place], [place] Registered Dietitian [phone] (mobile)

## 2020-12-30 NOTE — Patient Instructions (Signed)
[facility]  Discharge Instructions: Thank you for choosing [facility] to provide your oncology and hematology care.  If you have a lab appointment with the Cancer Center, please go directly to the Cancer Center and check in at the registration area.  Wear comfortable clothing and clothing appropriate for easy access to any Portacath or PICC line.   We strive to give you quality time with your provider. You may need to reschedule your appointment if you arrive late (15 or more minutes).  Arriving late affects you and other patients whose appointments are after yours.  Also, if you miss three or more appointments without notifying the office, you may be dismissed from the clinic at the provider's discretion.      For prescription refill requests, have your pharmacy contact our office and allow 72 hours for refills to be completed.    Today you received the following chemotherapy and/or immunotherapy agents - paclitaxel, carboplatin, pembrolizumab      To help prevent nausea and vomiting after your treatment, we encourage you to take your nausea medication as directed.  BELOW ARE SYMPTOMS THAT SHOULD BE REPORTED IMMEDIATELY: *FEVER GREATER THAN 100.4 F (38 C) OR HIGHER *CHILLS OR SWEATING *NAUSEA AND VOMITING THAT IS NOT CONTROLLED WITH YOUR NAUSEA MEDICATION *UNUSUAL SHORTNESS OF BREATH *UNUSUAL BRUISING OR BLEEDING *URINARY PROBLEMS (pain or burning when urinating, or frequent urination) *BOWEL PROBLEMS (unusual diarrhea, constipation, pain near the anus) TENDERNESS IN MOUTH AND THROAT WITH OR WITHOUT PRESENCE OF ULCERS (sore throat, sores in mouth, or a toothache) UNUSUAL RASH, SWELLING OR PAIN  UNUSUAL VAGINAL DISCHARGE OR ITCHING   Items with * indicate a potential emergency and should be followed up as soon as possible or go to the Emergency Department if any problems should occur.  Please show the CHEMOTHERAPY ALERT CARD or  IMMUNOTHERAPY ALERT CARD at check-in to the Emergency Department and triage nurse.  Should you have questions after your visit or need to cancel or reschedule your appointment, please contact [facility] [facility]  [phone] and follow the prompts.  Office hours are 8:00 a.m. to 4:30 p.m. Monday - Friday. Please note that voicemails left after 4:00 p.m. may not be returned until the following business day.  We are closed weekends and major holidays. You have access to a nurse at all times for urgent questions. Please call the main number to the clinic [phone] and follow the prompts.  For any non-urgent questions, you may also contact your provider using MyChart. We now offer e-Visits for anyone 18 and older to request care online for non-urgent symptoms. For details visit [email or web address].   Also download the MyChart app! Go to the app store, search "MyChart", open the app, select [facility], and log in with your MyChart username and password.  Due to Covid, a mask is required upon entering the hospital/clinic. If you do not have a mask, one will be given to you upon arrival. For doctor visits, patients may have 1 support person aged 18 or older with them. For treatment visits, patients cannot have anyone with them due to current Covid guidelines and our immunocompromised population.   Paclitaxel injection What is this medication? PACLITAXEL (PAK li TAX el) is a chemotherapy drug. It targets fast dividing cells, like cancer cells, and causes these cells to die. This medicine is used to treat ovarian cancer, breast cancer, lung cancer, Kaposi's sarcoma, andother cancers. This medicine may be used for other   purposes; ask your health care provider orpharmacist if you have questions. COMMON BRAND NAME(S): Onxol, Taxol What should I tell my care team before I take this medication? They need to know if you have any of these conditions: history of irregular  heartbeat liver disease low blood counts, like low white cell, platelet, or red cell counts lung or breathing disease, like asthma tingling of the fingers or toes, or other nerve disorder an unusual or allergic reaction to paclitaxel, alcohol, polyoxyethylated castor oil, other chemotherapy, other medicines, foods, dyes, or preservatives pregnant or trying to get pregnant breast-feeding How should I use this medication? This drug is given as an infusion into a vein. It is administered in a hospitalor clinic by a specially trained health care professional. Talk to your pediatrician regarding the use of this medicine in children.Special care may be needed. Overdosage: If you think you have taken too much of this medicine contact apoison control center or emergency room at once. NOTE: This medicine is only for you. Do not share this medicine with others. What if I miss a dose? It is important not to miss your dose. Call your doctor or health careprofessional if you are unable to keep an appointment. What may interact with this medication? Do not take this medicine with any of the following medications: live virus vaccines This medicine may also interact with the following medications: antiviral medicines for hepatitis, HIV or AIDS certain antibiotics like erythromycin and clarithromycin certain medicines for fungal infections like ketoconazole and itraconazole certain medicines for seizures like carbamazepine, phenobarbital, phenytoin gemfibrozil nefazodone rifampin St. John's wort This list may not describe all possible interactions. Give your health care provider a list of all the medicines, herbs, non-prescription drugs, or dietary supplements you use. Also tell them if you smoke, drink alcohol, or use illegaldrugs. Some items may interact with your medicine. What should I watch for while using this medication? Your condition will be monitored carefully while you are receiving this  medicine. You will need important blood work done while you are taking thismedicine. This medicine can cause serious allergic reactions. To reduce your risk you will need to take other medicine(s) before treatment with this medicine. If you experience allergic reactions like skin rash, itching or hives, swelling of theface, lips, or tongue, tell your doctor or health care professional right away. In some cases, you may be given additional medicines to help with side effects.Follow all directions for their use. This drug may make you feel generally unwell. This is not uncommon, as chemotherapy can affect healthy cells as well as cancer cells. Report any side effects. Continue your course of treatment even though you feel ill unless yourdoctor tells you to stop. Call your doctor or health care professional for advice if you get a fever, chills or sore throat, or other symptoms of a cold or flu. Do not treat yourself. This drug decreases your body's ability to fight infections. Try toavoid being around people who are sick. This medicine may increase your risk to bruise or bleed. Call your doctor orhealth care professional if you notice any unusual bleeding. Be careful brushing and flossing your teeth or using a toothpick because you may get an infection or bleed more easily. If you have any dental work done,tell your dentist you are receiving this medicine. Avoid taking products that contain aspirin, acetaminophen, ibuprofen, naproxen, or ketoprofen unless instructed by your doctor. These medicines may hide afever. Do not become pregnant while taking this medicine. Women should inform   their doctor if they wish to become pregnant or think they might be pregnant. There is a potential for serious side effects to an unborn child. Talk to your health care professional or pharmacist for more information. Do not breast-feed aninfant while taking this medicine. Men are advised not to father a child while receiving  this medicine. This product may contain alcohol. Ask your pharmacist or healthcare provider if this medicine contains alcohol. Be sure to tell all healthcare providers you are taking this medicine. Certain medicines, like metronidazole and disulfiram, can cause an unpleasant reaction when taken with alcohol. The reaction includes flushing, headache, nausea, vomiting, sweating, and increased thirst. Thereaction can last from 30 minutes to several hours. What side effects may I notice from receiving this medication? Side effects that you should report to your doctor or health care professionalas soon as possible: allergic reactions like skin rash, itching or hives, swelling of the face, lips, or tongue breathing problems changes in vision fast, irregular heartbeat high or low blood pressure mouth sores pain, tingling, numbness in the hands or feet signs of decreased platelets or bleeding - bruising, pinpoint red spots on the skin, black, tarry stools, blood in the urine signs of decreased red blood cells - unusually weak or tired, feeling faint or lightheaded, falls signs of infection - fever or chills, cough, sore throat, pain or difficulty passing urine signs and symptoms of liver injury like dark yellow or brown urine; general ill feeling or flu-like symptoms; light-colored stools; loss of appetite; nausea; right upper belly pain; unusually weak or tired; yellowing of the eyes or skin swelling of the ankles, feet, hands unusually slow heartbeat Side effects that usually do not require medical attention (report to yourdoctor or health care professional if they continue or are bothersome): diarrhea hair loss loss of appetite muscle or joint pain nausea, vomiting pain, redness, or irritation at site where injected tiredness This list may not describe all possible side effects. Call your doctor for medical advice about side effects. You may report side effects to FDA at1-800-FDA-1088. Where  should I keep my medication? This drug is given in a hospital or clinic and will not be stored at home. NOTE: This sheet is a summary. It may not cover all possible information. If you have questions about this medicine, talk to your doctor, pharmacist, orhealth care provider.  2022 Elsevier/Gold Standard (2019-03-07 13:37:23)  Carboplatin injection What is this medication? CARBOPLATIN (KAR boe pla tin) is a chemotherapy drug. It targets fast dividing cells, like cancer cells, and causes these cells to die. This medicine is usedto treat ovarian cancer and many other cancers. This medicine may be used for other purposes; ask your health care provider orpharmacist if you have questions. COMMON BRAND NAME(S): Paraplatin What should I tell my care team before I take this medication? They need to know if you have any of these conditions: blood disorders hearing problems kidney disease recent or ongoing radiation therapy an unusual or allergic reaction to carboplatin, cisplatin, other chemotherapy, other medicines, foods, dyes, or preservatives pregnant or trying to get pregnant breast-feeding How should I use this medication? This drug is usually given as an infusion into a vein. It is administered in ahospital or clinic by a specially trained health care professional. Talk to your pediatrician regarding the use of this medicine in children.Special care may be needed. Overdosage: If you think you have taken too much of this medicine contact apoison control center or emergency room at once. NOTE: This   medicine is only for you. Do not share this medicine with others. What if I miss a dose? It is important not to miss a dose. Call your doctor or health careprofessional if you are unable to keep an appointment. What may interact with this medication? medicines for seizures medicines to increase blood counts like filgrastim, pegfilgrastim, sargramostim some antibiotics like amikacin, gentamicin,  neomycin, streptomycin, tobramycin vaccines Talk to your doctor or health care professional before taking any of thesemedicines: acetaminophen aspirin ibuprofen ketoprofen naproxen This list may not describe all possible interactions. Give your health care provider a list of all the medicines, herbs, non-prescription drugs, or dietary supplements you use. Also tell them if you smoke, drink alcohol, or use illegaldrugs. Some items may interact with your medicine. What should I watch for while using this medication? Your condition will be monitored carefully while you are receiving this medicine. You will need important blood work done while you are taking thismedicine. This drug may make you feel generally unwell. This is not uncommon, as chemotherapy can affect healthy cells as well as cancer cells. Report any side effects. Continue your course of treatment even though you feel ill unless yourdoctor tells you to stop. In some cases, you may be given additional medicines to help with side effects.Follow all directions for their use. Call your doctor or health care professional for advice if you get a fever, chills or sore throat, or other symptoms of a cold or flu. Do not treat yourself. This drug decreases your body's ability to fight infections. Try toavoid being around people who are sick. This medicine may increase your risk to bruise or bleed. Call your doctor orhealth care professional if you notice any unusual bleeding. Be careful brushing and flossing your teeth or using a toothpick because you may get an infection or bleed more easily. If you have any dental work done,tell your dentist you are receiving this medicine. Avoid taking products that contain aspirin, acetaminophen, ibuprofen, naproxen, or ketoprofen unless instructed by your doctor. These medicines may hide afever. Do not become pregnant while taking this medicine. Women should inform their doctor if they wish to become pregnant  or think they might be pregnant. There is a potential for serious side effects to an unborn child. Talk to your health care professional or pharmacist for more information. Do not breast-feed aninfant while taking this medicine. What side effects may I notice from receiving this medication? Side effects that you should report to your doctor or health care professionalas soon as possible: allergic reactions like skin rash, itching or hives, swelling of the face, lips, or tongue signs of infection - fever or chills, cough, sore throat, pain or difficulty passing urine signs of decreased platelets or bleeding - bruising, pinpoint red spots on the skin, black, tarry stools, nosebleeds signs of decreased red blood cells - unusually weak or tired, fainting spells, lightheadedness breathing problems changes in hearing changes in vision chest pain high blood pressure low blood counts - This drug may decrease the number of white blood cells, red blood cells and platelets. You may be at increased risk for infections and bleeding. nausea and vomiting pain, swelling, redness or irritation at the injection site pain, tingling, numbness in the hands or feet problems with balance, talking, walking trouble passing urine or change in the amount of urine Side effects that usually do not require medical attention (report to yourdoctor or health care professional if they continue or are bothersome): hair loss loss of appetite   metallic taste in the mouth or changes in taste This list may not describe all possible side effects. Call your doctor for medical advice about side effects. You may report side effects to FDA at1-800-FDA-1088. Where should I keep my medication? This drug is given in a hospital or clinic and will not be stored at home. NOTE: This sheet is a summary. It may not cover all possible information. If you have questions about this medicine, talk to your doctor, pharmacist, orhealth care  provider.  2022 Elsevier/Gold Standard (2007-07-11 14:38:05)  Pembrolizumab injection What is this medication? PEMBROLIZUMAB (pem broe liz ue mab) is a monoclonal antibody. It is used totreat certain types of cancer. This medicine may be used for other purposes; ask your health care provider orpharmacist if you have questions. COMMON BRAND NAME(S): Keytruda What should I tell my care team before I take this medication? They need to know if you have any of these conditions: autoimmune diseases like Crohn's disease, ulcerative colitis, or lupus have had or planning to have an allogeneic stem cell transplant (uses someone else's stem cells) history of organ transplant history of chest radiation nervous system problems like myasthenia gravis or Guillain-Barre syndrome an unusual or allergic reaction to pembrolizumab, other medicines, foods, dyes, or preservatives pregnant or trying to get pregnant breast-feeding How should I use this medication? This medicine is for infusion into a vein. It is given by a health careprofessional in a hospital or clinic setting. A special MedGuide will be given to you before each treatment. Be sure to readthis information carefully each time. Talk to your pediatrician regarding the use of this medicine in children. While this drug may be prescribed for children as young as 6 months for selectedconditions, precautions do apply. Overdosage: If you think you have taken too much of this medicine contact apoison control center or emergency room at once. NOTE: This medicine is only for you. Do not share this medicine with others. What if I miss a dose? It is important not to miss your dose. Call your doctor or health careprofessional if you are unable to keep an appointment. What may interact with this medication? Interactions have not been studied. This list may not describe all possible interactions. Give your health care provider a list of all the medicines,  herbs, non-prescription drugs, or dietary supplements you use. Also tell them if you smoke, drink alcohol, or use illegaldrugs. Some items may interact with your medicine. What should I watch for while using this medication? Your condition will be monitored carefully while you are receiving thismedicine. You may need blood work done while you are taking this medicine. Do not become pregnant while taking this medicine or for 4 months after stopping it. Women should inform their doctor if they wish to become pregnant or think they might be pregnant. There is a potential for serious side effects to an unborn child. Talk to your health care professional or pharmacist for more information. Do not breast-feed an infant while taking this medicine orfor 4 months after the last dose. What side effects may I notice from receiving this medication? Side effects that you should report to your doctor or health care professionalas soon as possible: allergic reactions like skin rash, itching or hives, swelling of the face, lips, or tongue bloody or black, tarry breathing problems changes in vision chest pain chills confusion constipation cough diarrhea dizziness or feeling faint or lightheaded fast or irregular heartbeat fever flushing joint pain low blood counts - this medicine   may decrease the number of white blood cells, red blood cells and platelets. You may be at increased risk for infections and bleeding. muscle pain muscle weakness pain, tingling, numbness in the hands or feet persistent headache redness, blistering, peeling or loosening of the skin, including inside the mouth signs and symptoms of high blood sugar such as dizziness; dry mouth; dry skin; fruity breath; nausea; stomach pain; increased hunger or thirst; increased urination signs and symptoms of kidney injury like trouble passing urine or change in the amount of urine signs and symptoms of liver injury like dark urine,  light-colored stools, loss of appetite, nausea, right upper belly pain, yellowing of the eyes or skin sweating swollen lymph nodes weight loss Side effects that usually do not require medical attention (report to yourdoctor or health care professional if they continue or are bothersome): decreased appetite hair loss tiredness This list may not describe all possible side effects. Call your doctor for medical advice about side effects. You may report side effects to FDA at1-800-FDA-1088. Where should I keep my medication? This drug is given in a hospital or clinic and will not be stored at home. NOTE: This sheet is a summary. It may not cover all possible information. If you have questions about this medicine, talk to your doctor, pharmacist, orhealth care provider.  2022 Elsevier/Gold Standard (2019-03-07 21:44:53)   

## 2020-12-30 NOTE — Progress Notes (Signed)
Pt wants to know when he is getting port placed. No other concerns at this time.

## 2020-12-30 NOTE — Progress Notes (Signed)
Venous access difficult today and was ultimately obtained by Haywood Pao, RN charge nurse in the right forearm. Patient received IV premedications, pembrolizumab, and was receiving the paclitaxel infusion when he called me to the chairside because he noticed swelling above the IV site. Infusion was immediately stopped. The paclitaxel bag was actually complete at this point, extravasation occurred in the last 30-66ml of paclitaxel infusion. Patient not having pain at IV site, just pressure/tightness. I attempted to aspirate chemotherapy from the site. 0.79ml of blood was withdrawn into the extension tubing and IV catheter. The IV catheter was removed intact; gauze and Coban were applied, and cold compress applied to the forearm with arm elevated per extravasation guidelines provided by [facility] infusion pharmacy. Dr. Grayland Ormond and Faythe Casa NP were notified of approximate volume of extravasation (30-88ml estimated) and measures taken. Requested provider assessment at chairside. Dr. Grayland Ormond came to assess the patient at 1530. OK to discharge home with instructions to continue with cold compresses for 15-20 minutes about four times per day over the next 24-48 hours. Patient should call if there is any persistent pain, swelling, or blistering of the forearm. At the time of discharge swelling had resolved, still no pain at site. Patient is to have port placement 9/15. No carboplatin today per Dr. Grayland Ormond - he will will proceed with this next week as scheduled. AVS reviewed, discharged home in stable condition.

## 2020-12-31 ENCOUNTER — Encounter: Payer: Self-pay | Admitting: Oncology

## 2020-12-31 ENCOUNTER — Other Ambulatory Visit: Payer: Self-pay | Admitting: Radiology

## 2020-12-31 ENCOUNTER — Other Ambulatory Visit: Payer: Self-pay

## 2020-12-31 ENCOUNTER — Ambulatory Visit: Payer: 59

## 2020-12-31 DIAGNOSIS — C321 Malignant neoplasm of supraglottis: Secondary | ICD-10-CM

## 2020-12-31 LAB — T4: T4, Total: 6.2 ug/dL (ref 4.5–12.0)

## 2020-12-31 NOTE — Telephone Encounter (Signed)
PDL-1 results are scanned in chart.

## 2021-01-01 ENCOUNTER — Encounter: Payer: Self-pay | Admitting: Radiology

## 2021-01-01 ENCOUNTER — Ambulatory Visit: Payer: 59

## 2021-01-01 ENCOUNTER — Ambulatory Visit
Admission: RE | Admit: 2021-01-01 | Discharge: 2021-01-01 | Disposition: A | Discharge status: Home / Self Care | Payer: 59 | Source: Ambulatory Visit | Attending: Oncology | Admitting: Oncology

## 2021-01-01 ENCOUNTER — Other Ambulatory Visit: Payer: Self-pay

## 2021-01-01 DIAGNOSIS — C321 Malignant neoplasm of supraglottis: Secondary | ICD-10-CM | POA: Diagnosis not present

## 2021-01-01 DIAGNOSIS — Z7982 Long term (current) use of aspirin: Secondary | ICD-10-CM | POA: Insufficient documentation

## 2021-01-01 DIAGNOSIS — Z87891 Personal history of nicotine dependence: Secondary | ICD-10-CM | POA: Insufficient documentation

## 2021-01-01 HISTORY — PX: IR IMAGING GUIDED PORT INSERTION: IMG5740

## 2021-01-01 MED ORDER — LIDOCAINE-EPINEPHRINE 1 %-1:100000 IJ SOLN
INTRAMUSCULAR | Status: AC
  Administered 2021-01-01: 10 mL
  Filled 2021-01-01: qty 1

## 2021-01-01 MED ORDER — MIDAZOLAM HCL 2 MG/2ML IJ SOLN
INTRAMUSCULAR | Status: AC
  Filled 2021-01-01: qty 2

## 2021-01-01 MED ORDER — FENTANYL CITRATE (PF) 100 MCG/2ML IJ SOLN
INTRAMUSCULAR | Status: DC | PRN
  Administered 2021-01-01: 50 ug via INTRAVENOUS

## 2021-01-01 MED ORDER — HEPARIN SOD (PORK) LOCK FLUSH 100 UNIT/ML IV SOLN
INTRAVENOUS | Status: AC
  Administered 2021-01-01: 500 [IU]
  Filled 2021-01-01: qty 5

## 2021-01-01 MED ORDER — FENTANYL CITRATE (PF) 100 MCG/2ML IJ SOLN
INTRAMUSCULAR | Status: AC
  Filled 2021-01-01: qty 2

## 2021-01-01 MED ORDER — SODIUM CHLORIDE 0.9 % IV SOLN
INTRAVENOUS | Status: DC
  Filled 2021-01-01: qty 1000

## 2021-01-01 MED ORDER — MIDAZOLAM HCL 2 MG/2ML IJ SOLN
INTRAMUSCULAR | Status: DC | PRN
  Administered 2021-01-01: 1 mg via INTRAVENOUS

## 2021-01-01 MED ORDER — LIDOCAINE HCL (PF) 1 % IJ SOLN
INTRAMUSCULAR | Status: AC
  Administered 2021-01-01: 12 mL
  Filled 2021-01-01: qty 30

## 2021-01-01 NOTE — Procedures (Signed)
Interventional Radiology Procedure:   Indications: Metastatic tonsillar carcinoma  Procedure: Port placement  Findings: Right jugular port, tip at SVC/RA junction  Complications: None     EBL: Minimal, less than 10 ml  Plan: Discharge in one hour.  Keep port site and incisions dry for at least 24 hours.     Milanni Ayub R. Anselm Pancoast, MD  Pager: [phone]

## 2021-01-01 NOTE — H&P (Addendum)
Chief Complaint: Throat Cancer  Referring Physician(s): Delight Hoh  Supervising Physician: Markus Daft  Patient Status: [facility] - Out-pt  History of Present Illness: Scott Harding is a 65 y.o. 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  PET done December 16, 2020 showed 3 metastatic lesions to the lung.   He is here today for placement of  tunneled catheter with port so he can begin systemic chemotherapy.  He is NPO. No nausea/vomiting. No Fever/chills. ROS negative.   Past Medical History:  Diagnosis Date   Cancer [facility])    Coronary artery disease    Hypertension     Past Surgical History:  Procedure Laterality Date   CORONARY ARTERY BYPASS GRAFT  2010   Quadruple   DIRECT LARYNGOSCOPY  11/28/2020   Procedure: DIRECT LARYNGOSCOPY WITH BIOPSY;  Surgeon: Beverly Gust, MD;  Location: [facility] ORS;  Service: ENT;;   TRACHEOSTOMY TUBE PLACEMENT N/A 11/28/2020   Procedure: AWAKE TRACHEOSTOMY;  Surgeon: Beverly Gust, MD;  Location: [facility] ORS;  Service: ENT;  Laterality: N/A;    Allergies: Patient has no known allergies.  Medications: Prior to Admission medications   Medication Sig Start Date End Date Taking? Authorizing Provider  ALPRAZolam Duanne Moron) 0.5 MG tablet Take 1 tablet (0.5 mg total) by mouth daily as needed for anxiety (Take 1 hour prior to radiation). 12/23/20   Noreene Filbert, MD  amLODipine (NORVASC) 10 MG tablet Take 1 tablet by mouth daily. 09/21/18   [provider]  ascorbic acid (VITAMIN C) 500 MG tablet Take by mouth.    [provider]  aspirin 81 MG EC tablet Take by mouth.    [provider]  cyanocobalamin 1000 MCG tablet Take by mouth.    [provider]  DENTA 5000 PLUS 1.1 % CREA dental cream BRUSH WITH PASTE 2 3 TIMES PER DAY FOR AT LEAST 1 MINUTE 08/31/18   [provider]  lidocaine-prilocaine (EMLA) cream Apply 1 application topically as needed. 12/30/20   Lloyd Huger, MD  metoprolol succinate (TOPROL-XL) 25 MG 24 hr tablet Take 1 tablet (25 mg total) by mouth daily. 12/02/20 01/01/21  Sharen Hones, MD  olmesartan-hydrochlorothiazide (BENICAR HCT) 40-12.5 MG tablet Take 1 tablet by mouth daily.    [provider]  ondansetron (ZOFRAN) 8 MG tablet Take 1 tablet (8 mg total) by mouth every 8 (eight) hours as needed for nausea or vomiting. 12/30/20   Lloyd Huger, MD  prochlorperazine (COMPAZINE) 10 MG tablet Take 1 tablet (10 mg total) by mouth every 6 (six) hours as needed for nausea or vomiting. 12/30/20   Lloyd Huger, MD  rosuvastatin (CRESTOR) 40 MG tablet Take 40 mg by mouth daily.    [provider]     Family History  Problem Relation Age of Onset   Arthritis Mother    Heart attack Father    Brain cancer Sister     Social History   Socioeconomic History   Marital status: Divorced    Spouse name: Not on file   Number of children: 1   Years of education: Not on file   Highest education level: Not on file  Occupational History   Not on file  Tobacco Use   Smoking status: Former    Types: Cigarettes    Quit date: 10/17/2008    Years since quitting: 12.2   Smokeless tobacco: Never  Vaping Use   Vaping Use: Never used  Substance  and Sexual Activity   Alcohol use: Yes    Comment: occasionally   Drug use: Never   Sexual activity: Not on file  Other Topics Concern   Not on file  Social History Narrative   Not on file   Social Determinants of Health   Financial Resource Strain: Not on file  Food Insecurity: Not on file  Transportation Needs: Not on file  Physical Activity: Not on file  Stress: Not on file  Social Connections: Not on file     Review of Systems: A 12 point ROS discussed and pertinent positives are indicated in the HPI above.  All other systems are negative.  Review of Systems  Vital Signs: BP 112/74   Pulse (!) 54   Temp 97.8 F (36.6 C) (Oral)   Ht 5\' 9"  (1.753 m)   Wt  94.8 kg   SpO2 100%   BMI 30.86 kg/m   Physical Exam Vitals reviewed.  Constitutional:      Appearance: Normal appearance.  HENT:     Head: Normocephalic and atraumatic.  Eyes:     Extraocular Movements: Extraocular movements intact.  Neck:     Comments: Tracheostomy present Cardiovascular:     Rate and Rhythm: Normal rate and regular rhythm.  Pulmonary:     Effort: Pulmonary effort is normal. No respiratory distress.     Breath sounds: Normal breath sounds.  Abdominal:     General: There is no distension.     Palpations: Abdomen is soft.     Tenderness: There is no abdominal tenderness.  Musculoskeletal:        General: Normal range of motion.     Cervical back: Normal range of motion.  Skin:    General: Skin is warm and dry.  Neurological:     General: No focal deficit present.     Mental Status: He is alert and oriented to person, place, and time.  Psychiatric:        Mood and Affect: Mood normal.        Behavior: Behavior normal.        Thought Content: Thought content normal.        Judgment: Judgment normal.    Imaging: NM PET Image Restag (PS) Skull Base To Thigh  Result Date: 12/17/2020 CLINICAL DATA:  Subsequent treatment strategy for supraglottic squamous cell carcinoma LEFT tonsil. EXAM: NUCLEAR MEDICINE PET SKULL BASE TO THIGH TECHNIQUE: 10.7 mCi F-18 FDG was injected intravenously. Full-ring PET imaging was performed from the skull base to thigh after the radiotracer. CT data was obtained and used for attenuation correction and anatomic localization. Fasting blood glucose: 76 mg/dl COMPARISON:  PET-CT scan 11/12/2019, 09/18/2018.  CT neck 11/27/2020 FINDINGS: Mediastinal blood pool activity: SUV max 1.9 Liver activity: SUV max NA NECK: Intensely hypermetabolic circumferential thickened tissue in the hypopharynx extending from the glottis superiorly to the base of tongue. The near entirety of the hypopharynx is involved by the hypermetabolic thickened tissue SUV  max equal 22.6. Enlarged and hypermetabolic LEFT and RIGHT level II lymph nodes. Lymph nodes on the RIGHT are larger measuring 1.4 cm with SUV max equal 15.6 (image 56). Smaller hypermetabolic nodes LEFT level II with SUV max equal 5.3. There is intense metabolic activity associated with the tracheostomy tract. Incidental CT findings: none CHEST: Hypermetabolic RIGHT upper lobe pulmonary nodule measures 12 mm (image 100) with SUV max equal 8.2. Nodule in the RIGHT middle lobe measures 14 mm (image 123) with SUV max equal 7.2. Additional hypermetabolic  nodules clustered in the central RIGHT lower lobe with SUV max equal 4.1 on image 117. Incidental CT findings: none ABDOMEN/PELVIS: No abnormal hypermetabolic activity within the liver, pancreas, adrenal glands, or spleen. No hypermetabolic lymph nodes in the abdomen or pelvis. Incidental CT findings: none SKELETON: No focal hypermetabolic activity to suggest skeletal metastasis. Incidental CT findings: none IMPRESSION: 1. Intensely hypermetabolic and thickened circumferential tissue in the hypopharynx extending from the base of the tongue to the glottis most = consistent with squamous cell carcinoma recurrence. 2. Bilateral hypermetabolic level II  nodal metastasis. 3. Multifocal hypermetabolic nodular metastasis in the RIGHT lung. 4. Hypermetabolic activity along the tracheostomy tract with differential including inflammation versus malignancy. Electronically Signed   By: Suzy Bouchard M.D.   On: 12/17/2020 14:31    Labs:  CBC: Recent Labs    11/28/20 1015 11/29/20 0426 12/24/20 0825 12/30/20 0901  WBC 10.4 13.5* 5.4 5.8  HGB 12.5* 11.4* 11.6* 11.8*  HCT 36.3* 34.4* 35.5* 36.5*  PLT 228 203 201 225    COAGS: No results for input(s): INR, APTT in the last 8760 hours.  BMP: Recent Labs    11/28/20 1015 11/29/20 0426 12/24/20 0825 12/30/20 0901  NA 140 140 137 139  K 3.7 3.7 3.2* 3.5  CL 105 108 106 104  CO2 22 23 24 25   GLUCOSE 132*  155* 137* 93  BUN 24* 28* 16 22  CALCIUM 9.7 8.9 8.9 8.8*  CREATININE 1.92* 1.75* 1.25* 1.30*  GFRNONAA 38* 43* >60 >60    LIVER FUNCTION TESTS: Recent Labs    11/28/20 1015 12/24/20 0825 12/30/20 0901  BILITOT 0.7 0.3 0.5  AST 16 16 15   ALT 13 16 20   ALKPHOS 38 41 39  PROT 7.8 7.1 7.3  ALBUMIN 4.6 4.0 4.0    TUMOR MARKERS: No results for input(s): AFPTM, CEA, CA199, CHROMGRNA in the last 8760 hours.  Assessment and Plan:  Stage IVa squamous cell carcinoma of the left tonsil.  Will proceed with placement of a tunneled catheter with port today by Dr. Anselm Pancoast.  Risks and benefits of image guided port-a-catheter placement was discussed with the patient including, but not limited to bleeding, infection, pneumothorax, or fibrin sheath development and need for additional procedures.  All of the patient's questions were answered, patient is agreeable to proceed. Consent signed and in chart.   Electronically Signed: Murrell Redden, PA-C   01/01/2021, 9:14 AM      I spent a total of  15 Minutes  in face to face in clinical consultation, greater than 50% of which was counseling/coordinating care for port a cath.

## 2021-01-02 ENCOUNTER — Ambulatory Visit: Payer: 59

## 2021-01-04 NOTE — Progress Notes (Signed)
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  INTERVAL HISTORY: Patient returns to clinic today for further evaluation and consideration of cycle 1, day 8 of treatment.  He had an extravasation of his chemotherapy through his IV last week and did not fully complete treatment.  His right forearm remains warm and erythematous, but nontender.  He otherwise feels well.  He denies any other pain.  He has no neurologic complaints.  He denies any recent fevers or illnesses. He has no chest pain, shortness of breath, cough, or hemoptysis.  He denies any nausea, vomiting, constipation, or diarrhea.  He has no urinary complaints.  Patient offers no further specific complaints today.  REVIEW OF SYSTEMS:   Review of Systems  Constitutional: Negative.  Negative for fever, malaise/fatigue and weight loss.  Respiratory: Negative.  Negative for cough, hemoptysis and shortness of breath.   Cardiovascular: Negative.  Negative for chest pain and leg swelling.  Gastrointestinal: Negative.  Negative for abdominal pain.  Genitourinary: Negative.  Negative for dysuria.  Musculoskeletal: Negative.  Negative for neck pain.  Skin: Negative.  Negative for rash.  Neurological: Negative.  Negative for dizziness, focal weakness, weakness and headaches.  Psychiatric/Behavioral: Negative.  The patient is not nervous/anxious.    As per HPI. Otherwise, a complete review of systems is negative.  PAST MEDICAL HISTORY: Past Medical  History:  Diagnosis Date   Cancer [facility])    Coronary artery disease    Hypertension     PAST SURGICAL HISTORY: Past Surgical History:  Procedure Laterality Date   CORONARY ARTERY BYPASS GRAFT  2010   Quadruple   DIRECT LARYNGOSCOPY  11/28/2020   Procedure: DIRECT LARYNGOSCOPY WITH BIOPSY;  Surgeon: Beverly Gust, MD;  Location: [facility] ORS;  Service: ENT;;   IR IMAGING GUIDED PORT INSERTION  01/01/2021   TRACHEOSTOMY TUBE PLACEMENT N/A 11/28/2020   Procedure: AWAKE TRACHEOSTOMY;  Surgeon: Beverly Gust, MD;  Location: [facility] ORS;  Service: ENT;  Laterality: N/A;    FAMILY HISTORY: Family History  Problem Relation Age of Onset   Arthritis Mother    Heart attack Father    Brain cancer Sister     ADVANCED DIRECTIVES (Y/N):  N  HEALTH MAINTENANCE: Social History   Tobacco Use   Smoking status: Former    Types: Cigarettes    Quit date: 10/17/2008    Years since quitting: 12.2   Smokeless tobacco: Never  Vaping Use   Vaping Use: Never used  Substance Use Topics   Alcohol use: Yes    Comment: occasionally   Drug use: Never     Colonoscopy:  PAP:  Bone density:  Lipid panel:  No Known Allergies  Current Outpatient Medications  Medication Sig Dispense Refill   ALPRAZolam (XANAX) 0.5 MG tablet Take 1 tablet (0.5 mg total) by mouth daily as needed for anxiety (Take 1 hour prior to radiation). 30 tablet 1   amLODipine (NORVASC) 10 MG tablet Take 1 tablet by mouth daily.     ascorbic acid (VITAMIN C) 500 MG tablet Take by mouth.     aspirin 81  MG EC tablet Take by mouth.     cyanocobalamin 1000 MCG tablet Take by mouth.     DENTA 5000 PLUS 1.1 % CREA dental cream BRUSH WITH PASTE 2 3 TIMES PER DAY FOR AT LEAST 1 MINUTE     doxycycline (VIBRA-TABS) 100 MG tablet Take 1 tablet (100 mg total) by mouth 2 (two) times daily. 14 tablet 0   lidocaine-prilocaine (EMLA) cream Apply 1 application topically as needed. 30 g 0   olmesartan-hydrochlorothiazide (BENICAR HCT) 40-12.5 MG  tablet Take 1 tablet by mouth daily.     ondansetron (ZOFRAN) 8 MG tablet Take 1 tablet (8 mg total) by mouth every 8 (eight) hours as needed for nausea or vomiting. 60 tablet 2   prochlorperazine (COMPAZINE) 10 MG tablet Take 1 tablet (10 mg total) by mouth every 6 (six) hours as needed for nausea or vomiting. 60 tablet 2   rosuvastatin (CRESTOR) 40 MG tablet Take 40 mg by mouth daily.     metoprolol succinate (TOPROL-XL) 25 MG 24 hr tablet Take 1 tablet (25 mg total) by mouth daily. 30 tablet 0   No current facility-administered medications for this visit.   Facility-Administered Medications Ordered in Other Visits  Medication Dose Route Frequency Provider Last Rate Last Admin   heparin lock flush 100 unit/mL  500 Units Intravenous Once Lloyd Huger, MD       sodium chloride flush (NS) 0.9 % injection 10 mL  10 mL Intravenous PRN Lloyd Huger, MD   10 mL at 01/06/21 1305    OBJECTIVE: Vitals:   01/06/21 1319  BP: 110/62  Pulse: 85  Resp: 16  SpO2: 100%     Body mass index is 30.33 kg/m.    ECOG FS:0 - Asymptomatic  General: Well-developed, well-nourished, no acute distress. Eyes: Pink conjunctiva, anicteric sclera. HEENT: Normocephalic, moist mucous membranes. Lungs: No audible wheezing or coughing. Heart: Regular rate and rhythm. Abdomen: Soft, nontender, no obvious distention. Musculoskeletal: No edema, cyanosis, or clubbing. Neuro: Alert, answering all questions appropriately. Cranial nerves grossly intact. Skin: Right forearm with significant erythema. Psych: Normal affect.    LAB RESULTS:  Lab Results  Component Value Date   NA 137 01/06/2021   K 3.5 01/06/2021   CL 102 01/06/2021   CO2 28 01/06/2021   GLUCOSE 108 (H) 01/06/2021   BUN 19 01/06/2021   CREATININE 1.22 01/06/2021   CALCIUM 8.7 (L) 01/06/2021   PROT 6.7 01/06/2021   ALBUMIN 3.6 01/06/2021   AST 17 01/06/2021   ALT 22 01/06/2021   ALKPHOS 33 (L) 01/06/2021   BILITOT 1.0 01/06/2021    GFRNONAA >60 01/06/2021   GFRAA 37 (L) 11/13/2019    Lab Results  Component Value Date   WBC 4.1 01/06/2021   NEUTROABS 3.2 01/06/2021   HGB 11.0 (L) 01/06/2021   HCT 34.0 (L) 01/06/2021   MCV 97.7 01/06/2021   PLT 186 01/06/2021     STUDIES: NM PET Image Restag (PS) Skull Base To Thigh  Result Date: 12/17/2020 CLINICAL DATA:  Subsequent treatment strategy for supraglottic squamous cell carcinoma LEFT tonsil. EXAM: NUCLEAR MEDICINE PET SKULL BASE TO THIGH TECHNIQUE: 10.7 mCi F-18 FDG was injected intravenously. Full-ring PET imaging was performed from the skull base to thigh after the radiotracer. CT data was obtained and used for attenuation correction and anatomic localization. Fasting blood glucose: 76 mg/dl COMPARISON:  PET-CT scan 11/12/2019, 09/18/2018.  CT neck 11/27/2020 FINDINGS: Mediastinal blood pool activity: SUV max 1.9 Liver activity: SUV max NA  NECK: Intensely hypermetabolic circumferential thickened tissue in the hypopharynx extending from the glottis superiorly to the base of tongue. The near entirety of the hypopharynx is involved by the hypermetabolic thickened tissue SUV max equal 22.6. Enlarged and hypermetabolic LEFT and RIGHT level II lymph nodes. Lymph nodes on the RIGHT are larger measuring 1.4 cm with SUV max equal 15.6 (image 56). Smaller hypermetabolic nodes LEFT level II with SUV max equal 5.3. There is intense metabolic activity associated with the tracheostomy tract. Incidental CT findings: none CHEST: Hypermetabolic RIGHT upper lobe pulmonary nodule measures 12 mm (image 100) with SUV max equal 8.2. Nodule in the RIGHT middle lobe measures 14 mm (image 123) with SUV max equal 7.2. Additional hypermetabolic nodules clustered in the central RIGHT lower lobe with SUV max equal 4.1 on image 117. Incidental CT findings: none ABDOMEN/PELVIS: No abnormal hypermetabolic activity within the liver, pancreas, adrenal glands, or spleen. No hypermetabolic lymph nodes in the  abdomen or pelvis. Incidental CT findings: none SKELETON: No focal hypermetabolic activity to suggest skeletal metastasis. Incidental CT findings: none IMPRESSION: 1. Intensely hypermetabolic and thickened circumferential tissue in the hypopharynx extending from the base of the tongue to the glottis most = consistent with squamous cell carcinoma recurrence. 2. Bilateral hypermetabolic level II  nodal metastasis. 3. Multifocal hypermetabolic nodular metastasis in the RIGHT lung. 4. Hypermetabolic activity along the tracheostomy tract with differential including inflammation versus malignancy. Electronically Signed   By: Suzy Bouchard M.D.   On: 12/17/2020 14:31   IR IMAGING GUIDED PORT INSERTION  Result Date: 01/01/2021 INDICATION: 65 year old with tonsillar squamous cell carcinoma. Port-A-Cath needed for chemotherapy. EXAM: FLUOROSCOPIC AND ULTRASOUND GUIDED PLACEMENT OF A SUBCUTANEOUS PORT COMPARISON:  None. MEDICATIONS: Moderate sedation ANESTHESIA/SEDATION: Versed 1.0 mg IV; Fentanyl 50 mcg IV; Moderate Sedation Time:  50 minutes The patient was continuously monitored during the procedure by the interventional radiology nurse under my direct supervision. FLUOROSCOPY TIME:  1 minute, 12 seconds COMPLICATIONS: None immediate. PROCEDURE: The procedure, risks, benefits, and alternatives were explained to the patient. Questions regarding the procedure were encouraged and answered. The patient understands and consents to the procedure. Patient was placed supine on the interventional table. Ultrasound confirmed a patent right internal jugular vein. Ultrasound image was saved for documentation. The right chest and neck were cleaned with a skin antiseptic and a sterile drape was placed. Maximal barrier sterile technique was utilized including caps, mask, sterile gowns, sterile gloves, sterile drape, hand hygiene and skin antiseptic. The right neck was anesthetized with 1% lidocaine. Small incision was made in the  right neck with a blade. Micropuncture set was placed in the right internal jugular vein with ultrasound guidance. The micropuncture wire was used for measurement purposes. The right chest was anesthetized with 1% lidocaine with epinephrine. #15 blade was used to make an incision and a subcutaneous port pocket was formed. [place] was assembled. Subcutaneous tunnel was formed with a stiff tunneling device. The port catheter was brought through the subcutaneous tunnel. The port was placed in the subcutaneous pocket. The micropuncture set was exchanged for a peel-away sheath. The catheter was placed through the peel-away sheath and the tip was positioned at the superior cavoatrial junction. Catheter placement was confirmed with fluoroscopy. The port was accessed and flushed with heparinized saline. The port pocket was closed using two layers of absorbable sutures and Dermabond. The vein skin site was closed using a single layer of absorbable suture and Dermabond. Sterile dressings were applied. Patient tolerated the procedure well  without an immediate complication. Ultrasound and fluoroscopic images were taken and saved for this procedure. FINDINGS: Patent right internal jugular vein. Enlarged lymph nodes adjacent to the right internal jugular vein. IMPRESSION: Placement of a subcutaneous power-injectable port device. Catheter tip at the superior cavoatrial junction. Electronically Signed   By: Markus Daft M.D.   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   PLAN:    1.  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  Both malignancies are p16 positive.  Patient has had an emergency trach placed secondary to airway protection.  PET scan results from December 16, 2020 reviewed independently  with 3 metastatic lesions in patient's lung.  Case discussed with surgery and radiation oncology and it was determined to proceed with systemic chemotherapy using carboplatinum, Taxol, and Keytruda on day 1 with carboplatinum and Taxol only on day 8.  This will be a 21-day cycle.  Patient has now had port placement.  Proceed with cycle 1, day 8 of treatment which is carboplatinum and Taxol only.  Return to clinic in 2 weeks for further evaluation and consideration of cycle 2, day 1.  Plan to reimage at the conclusion of cycle 4.  2.  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  PET scan results from November 12, 2019 reviewed independently with no obvious evidence of recurrent or progressive disease.  Level 2 lymph node is no longer hypermetabolic.  3.  Renal insufficiency: Resolved. 4.  Anemia: Chronic and unchanged.  Patient's hemoglobin is 11.0. 5.  Hypokalemia: Resolved. 6.  Chemotherapy extravasation: Patient has not had port placement.  Patient was given a prescription for doxycycline for 7 days as prophylactic for possible superinfection at the site of extravasation.   Patient expressed understanding and was in agreement with this plan. He also understands that He can call clinic at any time with any questions, concerns, or complaints.   Cancer Staging Malignant neoplasm of supraglottis [facility]) Staging form: Larynx - Supraglottis, AJCC 8th Edition - Clinical stage from 12/11/2020: Stage IVA (cT3, cN2b, cM0) - Signed by Lloyd Huger, MD on 12/11/2020  Squamous cell carcinoma of left tonsil ([place]) Staging form: Pharynx - HPV-Mediated Oropharynx, AJCC 8th Edition - Clinical: No stage assigned - Unsigned   Lloyd Huger, MD   01/09/2021 5:35 AM

## 2021-01-05 ENCOUNTER — Ambulatory Visit: Payer: 59

## 2021-01-06 ENCOUNTER — Inpatient Hospital Stay: Payer: 59

## 2021-01-06 ENCOUNTER — Inpatient Hospital Stay (HOSPITAL_BASED_OUTPATIENT_CLINIC_OR_DEPARTMENT_OTHER): Payer: 59 | Admitting: Oncology

## 2021-01-06 ENCOUNTER — Encounter: Payer: Self-pay | Admitting: Oncology

## 2021-01-06 ENCOUNTER — Ambulatory Visit: Payer: 59

## 2021-01-06 VITALS — BP 110/62 | HR 85 | Resp 16 | Wt 205.4 lb

## 2021-01-06 DIAGNOSIS — C321 Malignant neoplasm of supraglottis: Secondary | ICD-10-CM

## 2021-01-06 DIAGNOSIS — Z5112 Encounter for antineoplastic immunotherapy: Secondary | ICD-10-CM | POA: Diagnosis not present

## 2021-01-06 LAB — CBC WITH DIFFERENTIAL/PLATELET
Abs Immature Granulocytes: 0.03 10*3/uL (ref 0.00–0.07)
Basophils Absolute: 0 10*3/uL (ref 0.0–0.1)
Basophils Relative: 1 %
Eosinophils Absolute: 0.1 10*3/uL (ref 0.0–0.5)
Eosinophils Relative: 3 %
HCT: 34 % — ABNORMAL LOW (ref 39.0–52.0)
Hemoglobin: 11 g/dL — ABNORMAL LOW (ref 13.0–17.0)
Immature Granulocytes: 1 %
Lymphocytes Relative: 15 %
Lymphs Abs: 0.6 10*3/uL — ABNORMAL LOW (ref 0.7–4.0)
MCH: 31.6 pg (ref 26.0–34.0)
MCHC: 32.4 g/dL (ref 30.0–36.0)
MCV: 97.7 fL (ref 80.0–100.0)
Monocytes Absolute: 0.1 10*3/uL (ref 0.1–1.0)
Monocytes Relative: 3 %
Neutro Abs: 3.2 10*3/uL (ref 1.7–7.7)
Neutrophils Relative %: 77 %
Platelets: 186 10*3/uL (ref 150–400)
RBC: 3.48 MIL/uL — ABNORMAL LOW (ref 4.22–5.81)
RDW: 13.1 % (ref 11.5–15.5)
WBC: 4.1 10*3/uL (ref 4.0–10.5)
nRBC: 0 % (ref 0.0–0.2)

## 2021-01-06 LAB — COMPREHENSIVE METABOLIC PANEL
ALT: 22 U/L (ref 0–44)
AST: 17 U/L (ref 15–41)
Albumin: 3.6 g/dL (ref 3.5–5.0)
Alkaline Phosphatase: 33 U/L — ABNORMAL LOW (ref 38–126)
Anion gap: 7 (ref 5–15)
BUN: 19 mg/dL (ref 8–23)
CO2: 28 mmol/L (ref 22–32)
Calcium: 8.7 mg/dL — ABNORMAL LOW (ref 8.9–10.3)
Chloride: 102 mmol/L (ref 98–111)
Creatinine, Ser: 1.22 mg/dL (ref 0.61–1.24)
GFR, Estimated: >60 mL/min (ref >60)
Glucose, Bld: 108 mg/dL — ABNORMAL HIGH (ref 70–99)
Potassium: 3.5 mmol/L (ref 3.5–5.1)
Sodium: 137 mmol/L (ref 135–145)
Total Bilirubin: 1 mg/dL (ref 0.3–1.2)
Total Protein: 6.7 g/dL (ref 6.5–8.1)

## 2021-01-06 LAB — TSH: TSH: 1.556 u[IU]/mL (ref 0.350–4.500)

## 2021-01-06 MED ORDER — SODIUM CHLORIDE 0.9% FLUSH
10.0000 mL | INTRAVENOUS | Status: AC | PRN
  Administered 2021-01-06: 10 mL via INTRAVENOUS
  Filled 2021-01-06: qty 10

## 2021-01-06 MED ORDER — HEPARIN SOD (PORK) LOCK FLUSH 100 UNIT/ML IV SOLN
500.0000 [IU] | Freq: Once | INTRAVENOUS | Status: AC
  Filled 2021-01-06: qty 5

## 2021-01-06 MED ORDER — FAMOTIDINE 20 MG IN NS 100 ML IVPB
20.0000 mg | Freq: Once | INTRAVENOUS | Status: AC
  Administered 2021-01-06: 20 mg via INTRAVENOUS
  Filled 2021-01-06: qty 20

## 2021-01-06 MED ORDER — HEPARIN SOD (PORK) LOCK FLUSH 100 UNIT/ML IV SOLN
500.0000 [IU] | Freq: Once | INTRAVENOUS | Status: AC | PRN
  Filled 2021-01-06: qty 5

## 2021-01-06 MED ORDER — HEPARIN SOD (PORK) LOCK FLUSH 100 UNIT/ML IV SOLN
INTRAVENOUS | Status: AC
  Administered 2021-01-06: 500 [IU]
  Filled 2021-01-06: qty 5

## 2021-01-06 MED ORDER — DOXYCYCLINE HYCLATE 100 MG PO TABS: 100.0000 mg | ORAL_TABLET | Freq: Two times a day (BID) | ORAL | 0 refills | Status: DC

## 2021-01-06 MED ORDER — DIPHENHYDRAMINE HCL 50 MG/ML IJ SOLN
25.0000 mg | Freq: Once | INTRAMUSCULAR | Status: AC
  Administered 2021-01-06: 25 mg via INTRAVENOUS
  Filled 2021-01-06: qty 1

## 2021-01-06 MED ORDER — SODIUM CHLORIDE 0.9 % IV SOLN
Freq: Once | INTRAVENOUS | Status: AC
  Filled 2021-01-06: qty 250

## 2021-01-06 MED ORDER — SODIUM CHLORIDE 0.9 % IV SOLN
10.0000 mg | Freq: Once | INTRAVENOUS | Status: AC
  Administered 2021-01-06: 10 mg via INTRAVENOUS
  Filled 2021-01-06: qty 10

## 2021-01-06 MED ORDER — PALONOSETRON HCL INJECTION 0.25 MG/5ML
0.2500 mg | Freq: Once | INTRAVENOUS | Status: AC
  Administered 2021-01-06: 0.25 mg via INTRAVENOUS
  Filled 2021-01-06: qty 5

## 2021-01-06 MED ORDER — SODIUM CHLORIDE 0.9 % IV SOLN
211.2000 mg | Freq: Once | INTRAVENOUS | Status: AC
  Administered 2021-01-06: 210 mg via INTRAVENOUS
  Filled 2021-01-06: qty 21

## 2021-01-06 MED ORDER — SODIUM CHLORIDE 0.9 % IV SOLN
150.0000 mg | Freq: Once | INTRAVENOUS | Status: AC
  Administered 2021-01-06: 150 mg via INTRAVENOUS
  Filled 2021-01-06: qty 5

## 2021-01-06 MED ORDER — SODIUM CHLORIDE 0.9 % IV SOLN
80.0000 mg/m2 | Freq: Once | INTRAVENOUS | Status: AC
  Administered 2021-01-06: 168 mg via INTRAVENOUS
  Filled 2021-01-06: qty 28

## 2021-01-06 NOTE — Progress Notes (Signed)
Nutrition Follow-up:  Patient with history of stage IV squamous cell carcinoma of left tonsil, p 16 positive, now with second primary in supraglottis stage IV, p 16 positive.  S/p emergent trach on 8/12.  Patient receiving carboplatinum, taxol, keytruda.    Met with patient during infusion.  Patient speaking at a whisper says that he has been able to eat chopped chicken, cooked carrots, cooked apples, eggs, toast from the [employer].  Does not cook much for himself.  Drinking nectar thick water, mostly.  Does not really like ensure shakes.  Did not try nectar thick vital cusine shakes RD gave last week.  Takes lactaid pills when eating/drinking dairy products.  Patient grateful that he can still taste food.  "I have to remember to chew, chew, chew food and take my time.    Medications: reviewed  Labs: reviewed  Anthropometrics:   Weight 205 lb 6.4 oz today  205 lb 3.2 oz on 9/13 217 lb on 05/20/20  Patient reports UBW of 215 lb   NUTRITION DIAGNOSIS: Inadequate oral intake stable   INTERVENTION:  Reminded patient that vital cusine 500 shakes have milk so if tried need to take lactose pill.  Patient to continue soft foods high in calories and protein.  Patient does not want to eat puree foods.  Continue nectar thick liquids.     MONITORING, EVALUATION, GOAL: weight trends, intake   NEXT VISIT: Tuesday, Oct 4th during infusion  Vianney Kopecky B. Zenia Resides, [place], [place] Registered Dietitian [phone] (mobile)

## 2021-01-06 NOTE — Patient Instructions (Signed)
[facility] CENTER [place] [facility]  Discharge Instructions: Thank you for choosing [facility] to provide your oncology and hematology care.  If you have a lab appointment with the Cancer Center, please go directly to the Cancer Center and check in at the registration area.  Wear comfortable clothing and clothing appropriate for easy access to any Portacath or PICC line.   We strive to give you quality time with your provider. You may need to reschedule your appointment if you arrive late (15 or more minutes).  Arriving late affects you and other patients whose appointments are after yours.  Also, if you miss three or more appointments without notifying the office, you may be dismissed from the clinic at the provider's discretion.      For prescription refill requests, have your pharmacy contact our office and allow 72 hours for refills to be completed.    Today you received the following chemotherapy and/or immunotherapy agents Taxol and Carboplatin       To help prevent nausea and vomiting after your treatment, we encourage you to take your nausea medication as directed.  BELOW ARE SYMPTOMS THAT SHOULD BE REPORTED IMMEDIATELY: *FEVER GREATER THAN 100.4 F (38 C) OR HIGHER *CHILLS OR SWEATING *NAUSEA AND VOMITING THAT IS NOT CONTROLLED WITH YOUR NAUSEA MEDICATION *UNUSUAL SHORTNESS OF BREATH *UNUSUAL BRUISING OR BLEEDING *URINARY PROBLEMS (pain or burning when urinating, or frequent urination) *BOWEL PROBLEMS (unusual diarrhea, constipation, pain near the anus) TENDERNESS IN MOUTH AND THROAT WITH OR WITHOUT PRESENCE OF ULCERS (sore throat, sores in mouth, or a toothache) UNUSUAL RASH, SWELLING OR PAIN  UNUSUAL VAGINAL DISCHARGE OR ITCHING   Items with * indicate a potential emergency and should be followed up as soon as possible or go to the Emergency Department if any problems should occur.  Please show the CHEMOTHERAPY ALERT CARD or IMMUNOTHERAPY ALERT CARD  at check-in to the Emergency Department and triage nurse.  Should you have questions after your visit or need to cancel or reschedule your appointment, please contact [facility] [place] [facility]  [phone] and follow the prompts.  Office hours are 8:00 a.m. to 4:30 p.m. Monday - Friday. Please note that voicemails left after 4:00 p.m. may not be returned until the following business day.  We are closed weekends and major holidays. You have access to a nurse at all times for urgent questions. Please call the main number to the clinic [phone] and follow the prompts.  For any non-urgent questions, you may also contact your provider using MyChart. We now offer e-Visits for anyone 18 and older to request care online for non-urgent symptoms. For details visit [email or web address].   Also download the MyChart app! Go to the app store, search "MyChart", open the app, select [facility], and log in with your MyChart username and password.  Due to Covid, a mask is required upon entering the hospital/clinic. If you do not have a mask, one will be given to you upon arrival. For doctor visits, patients may have 1 support person aged 18 or older with them. For treatment visits, patients cannot have anyone with them due to current Covid guidelines and our immunocompromised population.  

## 2021-01-06 NOTE — Progress Notes (Signed)
Pt in infusion to have port accessed for labs. Pt showed RN right arm where Taxol extravasated on 12/30/20. Right arm from approximately wrist to [facility] was red, tight, warm to touch, and swelling noted. Dr. Grayland Ormond and MD clinic team aware and pt is scheduled to see Dr. Grayland Ormond in office.

## 2021-01-06 NOTE — Progress Notes (Signed)
Pt presents with red, inflammed area to right anterior forearm that pt implies came from recent iv/lab work drawn from antecubital area. No other complaints at this time.

## 2021-01-07 ENCOUNTER — Ambulatory Visit: Payer: 59

## 2021-01-07 LAB — T4: T4, Total: 6.7 ug/dL (ref 4.5–12.0)

## 2021-01-08 ENCOUNTER — Ambulatory Visit: Payer: 59

## 2021-01-08 LAB — FUNGAL ORGANISM REFLEX

## 2021-01-08 LAB — FUNGUS CULTURE RESULT

## 2021-01-08 LAB — FUNGUS CULTURE WITH STAIN

## 2021-01-09 ENCOUNTER — Ambulatory Visit: Payer: 59

## 2021-01-09 ENCOUNTER — Encounter: Payer: Self-pay | Admitting: Oncology

## 2021-01-12 ENCOUNTER — Emergency Department: Payer: 59

## 2021-01-12 ENCOUNTER — Ambulatory Visit: Payer: 59

## 2021-01-12 ENCOUNTER — Other Ambulatory Visit: Payer: Self-pay | Admitting: Emergency Medicine

## 2021-01-12 ENCOUNTER — Inpatient Hospital Stay: Payer: 59

## 2021-01-12 ENCOUNTER — Telehealth: Payer: Self-pay | Admitting: *Deleted

## 2021-01-12 ENCOUNTER — Other Ambulatory Visit: Payer: Self-pay

## 2021-01-12 ENCOUNTER — Inpatient Hospital Stay
Admission: EM | Admit: 2021-01-12 | Discharge: 2021-01-15 | DRG: 164 | Disposition: A | Discharge status: Home / Self Care | Payer: 59 | Attending: Internal Medicine | Admitting: Internal Medicine

## 2021-01-12 DIAGNOSIS — Z8249 Family history of ischemic heart disease and other diseases of the circulatory system: Secondary | ICD-10-CM

## 2021-01-12 DIAGNOSIS — I25810 Atherosclerosis of coronary artery bypass graft(s) without angina pectoris: Secondary | ICD-10-CM | POA: Diagnosis present

## 2021-01-12 DIAGNOSIS — D638 Anemia in other chronic diseases classified elsewhere: Secondary | ICD-10-CM | POA: Diagnosis present

## 2021-01-12 DIAGNOSIS — J387 Other diseases of larynx: Secondary | ICD-10-CM | POA: Diagnosis not present

## 2021-01-12 DIAGNOSIS — Z930 Tracheostomy status: Secondary | ICD-10-CM

## 2021-01-12 DIAGNOSIS — T502X5A Adverse effect of carbonic-anhydrase inhibitors, benzothiadiazides and other diuretics, initial encounter: Secondary | ICD-10-CM | POA: Diagnosis present

## 2021-01-12 DIAGNOSIS — I255 Ischemic cardiomyopathy: Secondary | ICD-10-CM | POA: Diagnosis present

## 2021-01-12 DIAGNOSIS — Z20822 Contact with and (suspected) exposure to covid-19: Secondary | ICD-10-CM | POA: Diagnosis present

## 2021-01-12 DIAGNOSIS — Z79899 Other long term (current) drug therapy: Secondary | ICD-10-CM | POA: Diagnosis not present

## 2021-01-12 DIAGNOSIS — I2699 Other pulmonary embolism without acute cor pulmonale: Principal | ICD-10-CM | POA: Diagnosis present

## 2021-01-12 DIAGNOSIS — F419 Anxiety disorder, unspecified: Secondary | ICD-10-CM | POA: Diagnosis present

## 2021-01-12 DIAGNOSIS — E876 Hypokalemia: Secondary | ICD-10-CM | POA: Diagnosis present

## 2021-01-12 DIAGNOSIS — N184 Chronic kidney disease, stage 4 (severe): Secondary | ICD-10-CM | POA: Diagnosis present

## 2021-01-12 DIAGNOSIS — Z808 Family history of malignant neoplasm of other organs or systems: Secondary | ICD-10-CM

## 2021-01-12 DIAGNOSIS — C139 Malignant neoplasm of hypopharynx, unspecified: Secondary | ICD-10-CM | POA: Diagnosis present

## 2021-01-12 DIAGNOSIS — E782 Mixed hyperlipidemia: Secondary | ICD-10-CM | POA: Diagnosis present

## 2021-01-12 DIAGNOSIS — Z8673 Personal history of transient ischemic attack (TIA), and cerebral infarction without residual deficits: Secondary | ICD-10-CM | POA: Diagnosis not present

## 2021-01-12 DIAGNOSIS — L299 Pruritus, unspecified: Secondary | ICD-10-CM | POA: Diagnosis present

## 2021-01-12 DIAGNOSIS — Z9221 Personal history of antineoplastic chemotherapy: Secondary | ICD-10-CM

## 2021-01-12 DIAGNOSIS — T451X5A Adverse effect of antineoplastic and immunosuppressive drugs, initial encounter: Secondary | ICD-10-CM | POA: Diagnosis present

## 2021-01-12 DIAGNOSIS — R0902 Hypoxemia: Secondary | ICD-10-CM | POA: Diagnosis present

## 2021-01-12 DIAGNOSIS — D701 Agranulocytosis secondary to cancer chemotherapy: Secondary | ICD-10-CM | POA: Diagnosis present

## 2021-01-12 DIAGNOSIS — Z825 Family history of asthma and other chronic lower respiratory diseases: Secondary | ICD-10-CM

## 2021-01-12 DIAGNOSIS — N179 Acute kidney failure, unspecified: Secondary | ICD-10-CM | POA: Diagnosis present

## 2021-01-12 DIAGNOSIS — E785 Hyperlipidemia, unspecified: Secondary | ICD-10-CM

## 2021-01-12 DIAGNOSIS — I2510 Atherosclerotic heart disease of native coronary artery without angina pectoris: Secondary | ICD-10-CM | POA: Diagnosis present

## 2021-01-12 DIAGNOSIS — I10 Essential (primary) hypertension: Secondary | ICD-10-CM

## 2021-01-12 DIAGNOSIS — C321 Malignant neoplasm of supraglottis: Secondary | ICD-10-CM | POA: Diagnosis present

## 2021-01-12 DIAGNOSIS — J9801 Acute bronchospasm: Secondary | ICD-10-CM | POA: Diagnosis present

## 2021-01-12 DIAGNOSIS — Z87891 Personal history of nicotine dependence: Secondary | ICD-10-CM

## 2021-01-12 DIAGNOSIS — C099 Malignant neoplasm of tonsil, unspecified: Secondary | ICD-10-CM

## 2021-01-12 DIAGNOSIS — I129 Hypertensive chronic kidney disease with stage 1 through stage 4 chronic kidney disease, or unspecified chronic kidney disease: Secondary | ICD-10-CM | POA: Diagnosis present

## 2021-01-12 DIAGNOSIS — I2609 Other pulmonary embolism with acute cor pulmonale: Secondary | ICD-10-CM | POA: Diagnosis not present

## 2021-01-12 DIAGNOSIS — D709 Neutropenia, unspecified: Secondary | ICD-10-CM

## 2021-01-12 LAB — CBC
HCT: 35.2 % — ABNORMAL LOW (ref 39.0–52.0)
Hemoglobin: 11.9 g/dL — ABNORMAL LOW (ref 13.0–17.0)
MCH: 32.7 pg (ref 26.0–34.0)
MCHC: 33.8 g/dL (ref 30.0–36.0)
MCV: 96.7 fL (ref 80.0–100.0)
Platelets: 159 10*3/uL (ref 150–400)
RBC: 3.64 MIL/uL — ABNORMAL LOW (ref 4.22–5.81)
RDW: 12.8 % (ref 11.5–15.5)
WBC: 1.3 10*3/uL — CL (ref 4.0–10.5)
nRBC: 0 % (ref 0.0–0.2)

## 2021-01-12 LAB — BASIC METABOLIC PANEL
Anion gap: 6 (ref 5–15)
BUN: 16 mg/dL (ref 8–23)
CO2: 22 mmol/L (ref 22–32)
Calcium: 7.2 mg/dL — ABNORMAL LOW (ref 8.9–10.3)
Chloride: 111 mmol/L (ref 98–111)
Creatinine, Ser: 0.93 mg/dL (ref 0.61–1.24)
GFR, Estimated: >60 mL/min (ref >60)
Glucose, Bld: 87 mg/dL (ref 70–99)
Potassium: 2.9 mmol/L — ABNORMAL LOW (ref 3.5–5.1)
Sodium: 139 mmol/L (ref 135–145)

## 2021-01-12 LAB — RESP PANEL BY RT-PCR (FLU A&B, COVID) ARPGX2
Influenza A by PCR: NEGATIVE
Influenza B by PCR: NEGATIVE
SARS Coronavirus 2 by RT PCR: NEGATIVE

## 2021-01-12 LAB — APTT: aPTT: 30 seconds (ref 24–36)

## 2021-01-12 LAB — MAGNESIUM: Magnesium: 1.6 mg/dL — ABNORMAL LOW (ref 1.7–2.4)

## 2021-01-12 LAB — PROTIME-INR
INR: 1 (ref 0.8–1.2)
Prothrombin Time: 13.3 seconds (ref 11.4–15.2)

## 2021-01-12 LAB — TROPONIN I (HIGH SENSITIVITY): Troponin I (High Sensitivity): 11 ng/L (ref <18)

## 2021-01-12 MED ORDER — POTASSIUM CHLORIDE 10 MEQ/100ML IV SOLN
10.0000 meq | INTRAVENOUS | Status: DC
Start: 2021-01-12 — End: 2021-01-12
  Filled 2021-01-12: qty 100

## 2021-01-12 MED ORDER — ACETAMINOPHEN 325 MG RE SUPP: 650.0000 mg | Freq: Four times a day (QID) | RECTAL | Status: DC | PRN

## 2021-01-12 MED ORDER — METOPROLOL SUCCINATE ER 25 MG PO TB24
25.0000 mg | ORAL_TABLET | Freq: Every day | ORAL | Status: DC
  Administered 2021-01-13: 25 mg via ORAL
  Filled 2021-01-12: qty 1

## 2021-01-12 MED ORDER — SODIUM CHLORIDE 0.9% FLUSH
10.0000 mL | Freq: Two times a day (BID) | INTRAVENOUS | Status: DC
Start: 2021-01-12 — End: 2021-01-15
  Administered 2021-01-13 – 2021-01-15 (×4): 10 mL

## 2021-01-12 MED ORDER — ONDANSETRON HCL 4 MG/2ML IJ SOLN: 4.0000 mg | Freq: Four times a day (QID) | INTRAMUSCULAR | Status: DC | PRN

## 2021-01-12 MED ORDER — ENOXAPARIN SODIUM 100 MG/ML IJ SOSY
1.0000 mg/kg | PREFILLED_SYRINGE | Freq: Once | INTRAMUSCULAR | Status: DC
Start: 2021-01-12 — End: 2021-01-12
  Filled 2021-01-12: qty 0.9

## 2021-01-12 MED ORDER — ONDANSETRON HCL 4 MG PO TABS: 4.0000 mg | ORAL_TABLET | Freq: Four times a day (QID) | ORAL | Status: DC | PRN

## 2021-01-12 MED ORDER — OLMESARTAN MEDOXOMIL-HCTZ 40-12.5 MG PO TABS: 1.0000 | ORAL_TABLET | Freq: Every day | ORAL | Status: DC

## 2021-01-12 MED ORDER — ONDANSETRON HCL 4 MG/2ML IJ SOLN: 4.0000 mg | Freq: Once | INTRAMUSCULAR | Status: DC

## 2021-01-12 MED ORDER — ACETAMINOPHEN 325 MG PO TABS: 650.0000 mg | ORAL_TABLET | Freq: Four times a day (QID) | ORAL | Status: DC | PRN

## 2021-01-12 MED ORDER — IOHEXOL 350 MG/ML SOLN
75.0000 mL | Freq: Once | INTRAVENOUS | Status: AC | PRN
  Administered 2021-01-12: 75 mL via INTRAVENOUS

## 2021-01-12 MED ORDER — POTASSIUM CHLORIDE 20 MEQ PO PACK
60.0000 meq | PACK | Freq: Once | ORAL | Status: AC
  Administered 2021-01-12: 60 meq via ORAL
  Filled 2021-01-12: qty 3

## 2021-01-12 MED ORDER — AMLODIPINE BESYLATE 10 MG PO TABS
10.0000 mg | ORAL_TABLET | Freq: Every day | ORAL | Status: DC
  Administered 2021-01-12 – 2021-01-13 (×2): 10 mg via ORAL
  Filled 2021-01-12 (×2): qty 2

## 2021-01-12 MED ORDER — ALPRAZOLAM 0.5 MG PO TABS: 0.5000 mg | ORAL_TABLET | Freq: Every day | ORAL | Status: DC | PRN

## 2021-01-12 MED ORDER — HEPARIN (PORCINE) 25000 UT/250ML-% IV SOLN
1500.0000 [IU]/h | INTRAVENOUS | Status: DC
  Administered 2021-01-12: 1500 [IU]/h via INTRAVENOUS
  Filled 2021-01-12: qty 250

## 2021-01-12 MED ORDER — HEPARIN BOLUS VIA INFUSION
6000.0000 [IU] | Freq: Once | INTRAVENOUS | Status: AC
  Administered 2021-01-12: 6000 [IU] via INTRAVENOUS
  Filled 2021-01-12: qty 6000

## 2021-01-12 MED ORDER — CALAMINE EX LOTN
TOPICAL_LOTION | Freq: Two times a day (BID) | CUTANEOUS | Status: DC
  Filled 2021-01-12 (×2): qty 177

## 2021-01-12 MED ORDER — CHLORHEXIDINE GLUCONATE CLOTH 2 % EX PADS
6.0000 | MEDICATED_PAD | Freq: Every day | CUTANEOUS | Status: DC
  Administered 2021-01-14: 6 via TOPICAL
  Filled 2021-01-12 (×2): qty 6

## 2021-01-12 MED ORDER — IRBESARTAN 150 MG PO TABS
300.0000 mg | ORAL_TABLET | Freq: Every day | ORAL | Status: DC
Start: 2021-01-12 — End: 2021-01-14
  Administered 2021-01-12 – 2021-01-13 (×2): 300 mg via ORAL
  Filled 2021-01-12 (×2): qty 2

## 2021-01-12 MED ORDER — ONDANSETRON HCL 4 MG/2ML IJ SOLN: 4.0000 mg | Freq: Four times a day (QID) | INTRAMUSCULAR | Status: AC | PRN

## 2021-01-12 MED ORDER — POTASSIUM CHLORIDE 20 MEQ PO PACK
40.0000 meq | PACK | Freq: Once | ORAL | Status: AC
  Administered 2021-01-12: 40 meq via ORAL
  Filled 2021-01-12: qty 2

## 2021-01-12 MED ORDER — DIPHENHYDRAMINE HCL 50 MG/ML IJ SOLN
25.0000 mg | Freq: Three times a day (TID) | INTRAMUSCULAR | Status: DC | PRN
Start: 2021-01-12 — End: 2021-01-15

## 2021-01-12 MED ORDER — ACETAMINOPHEN 650 MG RE SUPP: 650.0000 mg | Freq: Four times a day (QID) | RECTAL | Status: DC | PRN

## 2021-01-12 MED ORDER — HYDROCHLOROTHIAZIDE 12.5 MG PO CAPS
12.5000 mg | ORAL_CAPSULE | Freq: Every day | ORAL | Status: DC
  Administered 2021-01-12 – 2021-01-13 (×2): 12.5 mg via ORAL
  Filled 2021-01-12 (×2): qty 1

## 2021-01-12 MED ORDER — PROCHLORPERAZINE MALEATE 10 MG PO TABS
10.0000 mg | ORAL_TABLET | Freq: Four times a day (QID) | ORAL | Status: DC | PRN
  Filled 2021-01-12: qty 1

## 2021-01-12 MED ORDER — ACETAMINOPHEN 500 MG PO TABS
1000.0000 mg | ORAL_TABLET | Freq: Four times a day (QID) | ORAL | Status: DC | PRN
  Administered 2021-01-12 – 2021-01-15 (×2): 1000 mg via ORAL
  Filled 2021-01-12 (×2): qty 2

## 2021-01-12 MED ORDER — MAGNESIUM SULFATE IN D5W 1-5 GM/100ML-% IV SOLN
1.0000 g | Freq: Once | INTRAVENOUS | Status: AC
  Administered 2021-01-13: 1 g via INTRAVENOUS
  Filled 2021-01-12: qty 100

## 2021-01-12 MED ORDER — ROSUVASTATIN CALCIUM 10 MG PO TABS
40.0000 mg | ORAL_TABLET | Freq: Every day | ORAL | Status: DC
  Administered 2021-01-13 – 2021-01-14 (×2): 40 mg via ORAL
  Filled 2021-01-12 (×2): qty 2
  Filled 2021-01-12 (×2): qty 4

## 2021-01-12 MED ORDER — SODIUM CHLORIDE 0.9% FLUSH: 10.0000 mL | INTRAVENOUS | Status: DC | PRN

## 2021-01-12 NOTE — ED Notes (Signed)
Pt medicated with tylenol for pain 3/10 in throat.

## 2021-01-12 NOTE — ED Notes (Signed)
Patient transported to CT 

## 2021-01-12 NOTE — Telephone Encounter (Signed)
Call received from answering service that daughter called reporting that patient is on the way to ER due to shortness of breath.

## 2021-01-12 NOTE — Progress Notes (Signed)
[facility] Progress Note - no charge  Received nursing message via secure chat requesting to change potassium supplementation from IV to p.o. due to limited IV access.  - IV potassium has been discontinued - Potassium via Polycitra 60 mill equivalents p.o. mixed with applesauce has been ordered - Requested nursing staff to obtain magnesium labs - If magnesium is low, we will replace  Dr. Tobie Poet

## 2021-01-12 NOTE — Consult Note (Signed)
ANTICOAGULATION CONSULT NOTE - Initial Consult  Pharmacy Consult for heparin infusion Indication: pulmonary embolus  No Known Allergies  Patient Measurements: Height: 5\' 10"  (177.8 cm) Weight: 90.7 kg (200 lb) IBW/kg (Calculated) : 73   Vital Signs: Temp: 98.7 F (37.1 C) (09/26 1435) Temp Source: Oral (09/26 0931) BP: 137/75 (09/26 1435) Pulse Rate: 53 (09/26 1435)  Labs: Recent Labs    01/12/21 1037 01/12/21 1132  HGB 11.9*  --   HCT 35.2*  --   PLT 159  --   CREATININE 0.93  --   TROPONINIHS  --  11    Estimated Creatinine Clearance: 90.9 mL/min (by C-G formula based on SCr of 0.93 mg/dL).   Medical History: Past Medical History:  Diagnosis Date   Cancer [facility])    Coronary artery disease    Hypertension     Medications:  No prior anticoagulation noted   Assessment: 65 y.o. male presenting with SOB. 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 Chest CT findings: Bilateral pulmonary emboli with CT evidence of right heart strain (RV/LV Ratio = 1.0) Pharmacy has consulted for heparin infusion.   Goal of Therapy:  Heparin level 0.3-0.7 units/ml Monitor platelets by anticoagulation protocol: Yes   Plan:  Give 6000 units bolus x 1 Start heparin infusion at 1500 units/hr Check anti-Xa level in 6 hours and daily while on heparin Continue to monitor H&H and platelets  Dorothe Pea, PharmD, BCPS Clinical Pharmacist   01/12/2021,3:35 PM

## 2021-01-12 NOTE — H&P (Addendum)
History and Physical   Scott Harding MOQ:[id number] DOB: 1956/02/02 DOA: 01/12/2021  PCP: Idelle Crouch, MD  Outpatient Specialists: Dr. Grayland Ormond, medical oncology Patient coming from: home   I have personally briefly reviewed patient's old medical records in [place].  Chief Concern:   HPI: Scott Harding is a 65 y.o. 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  He reports the shortness of breath is worse with bending forward and with movement. He has had increased respiratory effort.  He reports the shortness of breath started approximately 1 week ago.  He denies feeling this way before.    He endorsed swelling of the right upper ext, increased warmth and redness of right extremity that started on 01/01/21.  He further endorses pruritus of the right upper extremity. He further he believes that his left lower extremity started swelling on 01/11/2021.  He is not 100% sure about this.  He denies fever, chest pain, abdominal pain, dysuria, diarrhea, syncope, loss of consciousness.  Social history: He lives at home by himself. He is a former tobacco user, quit 15-20 years ago. At his peak, he was smoking 0.5 ppd. He is a former etoh user, drinking 6 beers infrequently, sosically. HE denies recreational drug use. He was a truck/diesel mechanci.   Vaccination history: He is vaccinated for covid 19, one dose of J&J  ROS: Constitutional: no weight change, no fever ENT/Mouth: no sore throat, no rhinorrhea Eyes: no eye pain, no vision changes Cardiovascular: no chest pain, + dyspnea,  + edema, no palpitations Respiratory: no cough, no sputum, no  wheezing Gastrointestinal: no nausea, no vomiting, no diarrhea, no constipation Genitourinary: no urinary incontinence, no dysuria, no hematuria Musculoskeletal: no arthralgias, no myalgias Skin: no skin lesions, + pruritus Neuro: + weakness, no loss of consciousness, no syncope Psych: no anxiety, no depression, no decrease appetite Heme/Lymph: no bruising, no bleeding  ED Course: Discussed with emergency medicine provider, patient requiring hospitalization for chief concerns of bilateral pulmonary embolism.  Vitals in the emergency department was remarkable for temperature 98.7, respiration rate of 22, heart rate of 60, blood pressure 138/78, SPO2 100% on room air.  Labs in the emergency department was remarkable for sodium 139, potassium 2.9, chloride 111.  Enoxaparin 90 mg subcutaneous dosing was ordered by EDP.  Assessment/Plan  Principal Problem:   Pulmonary embolism (HCC) Active Problems:   Benign essential hypertension   CAD (coronary artery disease) of bypass graft   Hyperlipemia, mixed   Ischemic cardiomyopathy   Squamous cell carcinoma of left tonsil (HCC)   Laryngeal mass   Malignant neoplasm of supraglottis (HCC)   Hypokalemia   # Bilateral pulmonary emboli-per CT read: Proximal clot seen at the lobar level on the right.  RV/LV ratio is 1.0 consistent with at least submassive PE. - Vascular has been consulted - Discontinued enoxaparin subcutaneous dose - Heparin GTT started, holding home aspirin at this time - Bilateral lower extremity ultrasound to assess for DVT ordered, bilateral upper extremity ultrasound to assess for DVT ordered - Complete echo ordered to assess for right heart strain and ventricular function - N.p.o. after midnight in anticipation of possible thrombectomy  # Hypokalemia-present on admission - Presumed secondary to hydrochlorothiazide - Check magnesium level -  Replace with potassium chloride packet 40 mill equivalent, mixed with applesauce,  one-time dose - Potassium chloride 10 mEq IV administered over 60 minutes, 4 times ordered - Check magnesium in the a.m.  # Hypomagnesemia-replace, check magnesium in a.m.  # Neutropenia-no clinical suspicion for infectious etiology at this time given no fever, no elevated heart rate, increased respiration rate, and symptoms that would be indicative of infectious etiology at this time including no dysuria, diarrhea, cough - CBC with differential ordered - Presumed secondary to antineoplastic infusion on 01/06/2021 specifically carboplatin, paclitaxel  # Anxiety-alprazolam 0.5 mg p.o. daily as needed for anxiety  # Right upper extremity pruritus-calamine lotion topical twice daily - Diphenhydramine 25 mg IV every 6 hours as needed for itching and allergies, 2 doses ordered  # Hyperlipidemia-rosuvastatin 40 mg daily resumed  # Hypertension-patient is on olmesartan-hydrochlorothiazide 20-12.5 mg tablet, amlodipine 10 mg daily, metoprolol succinate 25 mg daily resumed  Chart reviewed.   DVT prophylaxis: Heparin GTT Code Status: Full code Diet: Heart healthy, nectar thick, with assistance; n.p.o. after midnight except for sips with meds Family Communication: Updated daughter, Scott Harding who is the POA, at bedside Disposition Plan: Pending clinical course Consults called: Vascular Admission status: Stepdown, inpatient, telemetry  Past Medical History:  Diagnosis Date   Cancer [facility])    Coronary artery disease    Hypertension    Past Surgical History:  Procedure Laterality Date   CORONARY ARTERY BYPASS GRAFT  2010   Quadruple   DIRECT LARYNGOSCOPY  11/28/2020   Procedure: DIRECT LARYNGOSCOPY WITH BIOPSY;  Surgeon: Beverly Gust, MD;  Location: [facility] ORS;  Service: ENT;;   IR IMAGING GUIDED PORT INSERTION  01/01/2021   TRACHEOSTOMY TUBE PLACEMENT N/A 11/28/2020   Procedure: AWAKE TRACHEOSTOMY;  Surgeon: Beverly Gust, MD;  Location: [facility] ORS;  Service: ENT;  Laterality: N/A;   Social  History:  reports that he quit smoking about 12 years ago. His smoking use included cigarettes. He has never used smokeless tobacco. He reports current alcohol use. He reports that he does not use drugs.  No Known Allergies Family History  Problem Relation Age of Onset   Arthritis Mother    Heart attack Father    Brain cancer Sister    Family history: Family history reviewed and not pertinent  Prior to Admission medications   Medication Sig Start Date End Date Taking? Authorizing Provider  ALPRAZolam Duanne Moron) 0.5 MG tablet Take 1 tablet (0.5 mg total) by mouth daily as needed for anxiety (Take 1 hour prior to radiation). 12/23/20   Noreene Filbert, MD  amLODipine (NORVASC) 10 MG tablet Take 1 tablet by mouth daily. 09/21/18   [provider]  ascorbic acid (VITAMIN C) 500 MG tablet Take by mouth.    [provider]  aspirin 81 MG EC tablet Take by mouth.    [provider]  cyanocobalamin 1000 MCG tablet Take by mouth.    [provider]  DENTA 5000 PLUS 1.1 % CREA dental cream BRUSH WITH PASTE 2 3 TIMES PER DAY FOR AT LEAST 1 MINUTE 08/31/18   [provider]  doxycycline (VIBRA-TABS) 100 MG tablet Take 1 tablet (100 mg total) by mouth 2 (two) times daily. 01/06/21   Lloyd Huger, MD  lidocaine-prilocaine (EMLA) cream Apply 1 application topically as needed. 12/30/20   Lloyd Huger, MD  metoprolol succinate (TOPROL-XL) 25 MG 24 hr tablet Take 1 tablet (25 mg total) by mouth daily. 12/02/20 01/01/21  Sharen Hones, MD  olmesartan-hydrochlorothiazide (BENICAR HCT)  40-12.5 MG tablet Take 1 tablet by mouth daily.    [provider]  ondansetron (ZOFRAN) 8 MG tablet Take 1 tablet (8 mg total) by mouth every 8 (eight) hours as needed for nausea or vomiting. 12/30/20   Lloyd Huger, MD  prochlorperazine (COMPAZINE) 10 MG tablet Take 1 tablet (10 mg total) by mouth every 6 (six) hours as needed for nausea or vomiting. 12/30/20   Lloyd Huger, MD  rosuvastatin (CRESTOR) 40 MG tablet Take 40 mg by mouth daily.    [provider]   Physical Exam: Vitals:   01/12/21 1300 01/12/21 1330 01/12/21 1435 01/12/21 1707  BP: 127/80 137/75 137/75 127/65  Pulse: (!) 52 (!) 54 (!) 53 (!) 57  Resp: 16 16 16 14   Temp:   98.7 F (37.1 C)   TempSrc:      SpO2: 99% 100% 99% 97%  Weight:      Height:       Constitutional: appears age-appropriate, NAD, calm, comfortable Eyes: PERRL, lids and conjunctivae normal ENMT: Mucous membranes are moist. Posterior pharynx clear of any exudate or lesions. Age-appropriate dentition. Hearing appropriate.  Trachea in place. Neck: normal, supple, no masses, no thyromegaly Respiratory: clear to auscultation bilaterally, no wheezing, no crackles. Normal respiratory effort. No accessory muscle use.  Cardiovascular: Regular rate and rhythm, no murmurs / rubs / gallops. No extremity edema. 2+ pedal pulses. No carotid bruits.  Abdomen: obese abdomen, no tenderness, no masses palpated, no hepatosplenomegaly. Bowel sounds positive.  Musculoskeletal: no clubbing / cyanosis. No joint deformity upper and lower extremities. Good ROM, no contractures, no atrophy. Normal muscle tone. Left calf larger than right calf Skin: no rashes, lesions, ulcers. No induration. Redness and swelling of the right upper extremity  Neurologic: Sensation intact. Strength 5/5 in all 4.  Psychiatric: Normal judgment and insight. Alert and oriented x 3. Normal mood.   EKG: independently reviewed, showing sinus bradycardia with rate of 56, QTc 418  Chest x-ray on Admission: I personally reviewed and I agree with radiologist reading as below.  DG Chest 2 View  Result Date: 01/12/2021 CLINICAL DATA:  Shortness of breath. EXAM: CHEST - 2 VIEW COMPARISON:  Chest radiograph 11/28/2020.  PET-CT 12/16/2020. FINDINGS: Tracheostomy tube remains in place overlying the airway. A right jugular Port-A-Cath terminates over the lower SVC.  Sequelae of CABG are again identified. The cardiomediastinal silhouette is within normal limits. There is mild chronic coarsening of the interstitial markings. No acute airspace consolidation, edema, pleural effusion, pneumothorax is identified. Bilateral lung nodules were more fully evaluated on the prior PET-CT. No acute osseous abnormality is seen. IMPRESSION: No active cardiopulmonary disease. Electronically Signed   By: Logan Bores M.D.   On: 01/12/2021 10:02    Labs on Admission: I have personally reviewed following labs  CBC: Recent Labs  Lab 01/06/21 1248 01/12/21 1037  WBC 4.1 1.3*  NEUTROABS 3.2  --   HGB 11.0* 11.9*  HCT 34.0* 35.2*  MCV 97.7 96.7  PLT 186 163   Basic Metabolic Panel: Recent Labs  Lab 01/06/21 1248 01/12/21 1037  NA 137 139  K 3.5 2.9*  CL 102 111  CO2 28 22  GLUCOSE 108* 87  BUN 19 16  CREATININE 1.22 0.93  CALCIUM 8.7* 7.2*   GFR: Estimated Creatinine Clearance: 90.9 mL/min (by C-G formula based on SCr of 0.93 mg/dL).  Liver Function Tests: Recent Labs  Lab 01/06/21 1248  AST 17  ALT 22  ALKPHOS 33*  BILITOT  1.0  PROT 6.7  ALBUMIN 3.6   CRITICAL CARE Performed by: Criss Alvine  Total critical care time: 40 minutes  Critical care time was exclusive of separately billable procedures and treating other patients.  Critical care was necessary to treat or prevent imminent or life-threatening deterioration.  Bilateral submassive pulmonary embolism  Critical care was time spent personally by me on the following activities: development of treatment plan with patient and/or surrogate as well as nursing, discussions with consultants, evaluation of patient's response to treatment, examination of patient, obtaining history from patient or surrogate, ordering and performing treatments and interventions, ordering and review of laboratory studies, ordering and review of radiographic studies, pulse oximetry and re-evaluation of patient's  condition.  Dr. Tobie Poet [facility]  If 7PM-7AM, please contact overnight-coverage provider If 7AM-7PM, please contact day coverage provider [email or web address]  01/12/2021, 6:01 PM

## 2021-01-12 NOTE — ED Triage Notes (Addendum)
Pt comes with c/o SOb. Pt does have trach in place. Pt states SOb and CP for few days. Pt recently had trach placed in August and port placed on 9-15.port removed due to reaction. Pt has redness noted to right arm with swelling. Pt also have new rash per family to chest. Last chemo was thursday

## 2021-01-12 NOTE — ED Notes (Signed)
Returned from CT.

## 2021-01-12 NOTE — ED Notes (Signed)
Pt ate partial dinner, pt walked to toilet in room. Pt trac was cleaned by this nurse. Pt communicates by moving lips and communicated appreciation. Ultrasound to room at this time for extremity US

## 2021-01-12 NOTE — Telephone Encounter (Signed)
Noted. Thank you.   Faythe Casa, NP 01/12/2021 11:54 AM

## 2021-01-12 NOTE — ED Notes (Signed)
Echo at bedside at this time

## 2021-01-12 NOTE — ED Notes (Signed)
Critical Lab WBC 1.3

## 2021-01-12 NOTE — ED Notes (Signed)
Pt sitting up resting with daughter at bedside. Continues to ask for food waiting for provider to give the ok. Pt understands. Call bell in reach.

## 2021-01-12 NOTE — ED Notes (Signed)
Pt sitting up in bed. Unable to talk due to trach but moved lips to express concerns. Pt stated he was uncomfortable and tired. Adjusted head of bed for increased comfort. Pt denies pillow. Daughter at bedside. Call bell in reach.

## 2021-01-12 NOTE — ED Provider Notes (Signed)
[facility] Emergency Department Provider Note ____________________________________________   Event Date/Time   First MD Initiated Contact with Patient 01/12/21 1036     (approximate)  I have reviewed the triage vital signs and the nursing notes.   HISTORY  Chief Complaint Shortness of Breath    HPI Scott Harding is a 65 y.o. 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  Shortness of breath: Patient reports increased shortness of breath over the last week which comes and goes.  He reports intermittent associated chest pain especially if he breathes hard.  He has a trach in place and states it is functioning well but is concerned that he may have an increased mass developing in his throat or chest.  He denies associated fever or significant cough.  Right arm rash: The patient has had erythema and induration of his right forearm for the last couple of weeks ever since one of his chemo treatments infiltrated.  He was given a course of antibiotics for possible cellulitis and reports no relief.  The area is somewhat painful and moderately itchy.  Torso rash: The patient has had a papular scattered rash to his torso over the last week or so ever since his last chemotherapy treatment.  It is not painful or itchy.  It does not involve his palms or soles.   Past Medical History:  Diagnosis Date   Cancer [facility])    Coronary artery disease    Hypertension     Patient Active Problem List   Diagnosis Date Noted   Pulmonary embolism ([place]) 01/12/2021   Malignant neoplasm of supraglottis ([place]) 12/11/2020   Laryngeal mass 11/28/2020   CKD (chronic kidney disease), stage IV ([place]) 11/28/2020   Goals of care, counseling/discussion 10/05/2018   Squamous cell carcinoma of left tonsil ([place]) 09/15/2018   Stroke ([place]) 09/14/2018   Benign essential  hypertension 08/05/2014   CAD (coronary artery disease) of bypass graft 01/24/2014   Hyperlipemia, mixed 01/24/2014   Ischemic cardiomyopathy 01/24/2014   Valvular heart disease 01/24/2014   Ischemic optic neuropathy of both eyes 04/18/2012    Past Surgical History:  Procedure Laterality Date   CORONARY ARTERY BYPASS GRAFT  2010   Quadruple   DIRECT LARYNGOSCOPY  11/28/2020   Procedure: DIRECT LARYNGOSCOPY WITH BIOPSY;  Surgeon: Beverly Gust, MD;  Location: [facility] ORS;  Service: ENT;;   IR IMAGING GUIDED PORT INSERTION  01/01/2021   TRACHEOSTOMY TUBE PLACEMENT N/A 11/28/2020   Procedure: AWAKE TRACHEOSTOMY;  Surgeon: Beverly Gust, MD;  Location: [facility] ORS;  Service: ENT;  Laterality: N/A;    Prior to Admission medications   Medication Sig Start Date End Date Taking? Authorizing Provider  ALPRAZolam Duanne Moron) 0.5 MG tablet Take 1 tablet (0.5 mg total) by mouth daily as needed for anxiety (Take 1 hour prior to radiation). 12/23/20   Noreene Filbert, MD  amLODipine (NORVASC) 10 MG tablet Take 1 tablet by mouth daily. 09/21/18   [provider]  ascorbic acid (VITAMIN C) 500 MG tablet Take by mouth.    [provider]  aspirin 81 MG EC tablet Take by mouth.    [provider]  cyanocobalamin 1000 MCG tablet Take by mouth.    [provider]  DENTA 5000 PLUS 1.1 % CREA dental cream BRUSH WITH PASTE 2 3 TIMES PER DAY FOR AT LEAST 1 MINUTE 08/31/18   [provider]  doxycycline (VIBRA-TABS) 100 MG tablet Take 1 tablet (100 mg total) by mouth 2 (two) times daily. 01/06/21   Lloyd Huger, MD  lidocaine-prilocaine (EMLA) cream Apply 1 application topically as needed. 12/30/20   Lloyd Huger, MD  metoprolol succinate (TOPROL-XL) 25 MG 24 hr tablet Take 1 tablet (25 mg total) by mouth daily. 12/02/20 01/01/21  Sharen Hones, MD  olmesartan-hydrochlorothiazide (BENICAR HCT) 40-12.5 MG tablet Take 1 tablet by mouth daily.    [provider]   ondansetron (ZOFRAN) 8 MG tablet Take 1 tablet (8 mg total) by mouth every 8 (eight) hours as needed for nausea or vomiting. 12/30/20   Lloyd Huger, MD  prochlorperazine (COMPAZINE) 10 MG tablet Take 1 tablet (10 mg total) by mouth every 6 (six) hours as needed for nausea or vomiting. 12/30/20   Lloyd Huger, MD  rosuvastatin (CRESTOR) 40 MG tablet Take 40 mg by mouth daily.    [provider]    Allergies Patient has no known allergies.  Family History  Problem Relation Age of Onset   Arthritis Mother    Heart attack Father    Brain cancer Sister     Social History Social History   Tobacco Use   Smoking status: Former    Types: Cigarettes    Quit date: 10/17/2008    Years since quitting: 12.2   Smokeless tobacco: Never  Vaping Use   Vaping Use: Never used  Substance Use Topics   Alcohol use: Yes    Comment: occasionally   Drug use: Never    Review of Systems  Constitutional: No fever. Eyes: No visual changes. ENT: No sore throat. Cardiovascular: Positive for intermittent chest pain. Respiratory: Plus for intermittent shortness of breath. Gastrointestinal: No vomiting. Genitourinary: Negative for flank pain. Musculoskeletal: Negative for back pain. Skin: Positive for rash. Neurological: Negative for headache.   ____________________________________________   PHYSICAL EXAM:  VITAL SIGNS: ED Triage Vitals  Enc Vitals Group     BP 01/12/21 0931 138/73     Pulse Rate 01/12/21 0931 60     Resp 01/12/21 0931 (!) 22     Temp 01/12/21 0931 98.7 F (37.1 C)     Temp Source 01/12/21 0931 Oral     SpO2 01/12/21 0931 100 %     Weight 01/12/21 0931 200 lb (90.7 kg)     Height 01/12/21 0931 5\' 10"  (1.778 m)     Head Circumference --      Peak Flow --      Pain Score 01/12/21 0930 7     Pain Loc --      Pain Edu? --      Excl. in [place]? --     Constitutional: Alert and oriented.  Relatively well appearing and in no acute distress. Eyes:  Conjunctivae are normal.  Head: Atraumatic. Nose: No congestion/rhinnorhea. Mouth/Throat: Mucous membranes are moist.  Oropharynx clear. Neck: Normal range of motion.  Tracheostomy in place with no abnormal secretions. Cardiovascular: Normal rate, regular rhythm. Grossly normal heart sounds.  Good peripheral circulation. Respiratory: Normal respiratory effort.  No retractions. Lungs CTAB. Gastrointestinal: No distention.  Musculoskeletal: No lower extremity edema.  Extremities warm and well perfused.  Right forearm with diffuse erythema, induration, and slight warmth.  No streaking or erythema going up the arm.  2+ radial pulse.  Full range of motion at all joints. Neurologic:  Normal speech and language. No gross focal neurologic deficits are appreciated.  Skin:  Skin is  warm and dry.  Faint scattered papules to upper chest.  No palm/sole or mucosal involvement. Psychiatric: Mood and affect are normal. Speech and behavior are normal.  ____________________________________________   LABS (all labs ordered are listed, but only abnormal results are displayed)  Labs Reviewed  CBC - Abnormal; Notable for the following components:      Result Value   WBC 1.3 (*)    RBC 3.64 (*)    Hemoglobin 11.9 (*)    HCT 35.2 (*)    All other components within normal limits  BASIC METABOLIC PANEL - Abnormal; Notable for the following components:   Potassium 2.9 (*)    Calcium 7.2 (*)    All other components within normal limits  RESP PANEL BY RT-PCR (FLU A&B, COVID) ARPGX2  PROTIME-INR  APTT  TROPONIN I (HIGH SENSITIVITY)   ____________________________________________  EKG  ED ECG REPORT I, Arta Silence, the attending physician, personally viewed and interpreted this ECG.  Date: 01/12/2021 EKG Time: 0929 Rate: 56 Rhythm: normal sinus rhythm QRS Axis: normal Intervals: normal ST/T Wave abnormalities: Nonspecific ST abnormalities Narrative Interpretation: Nonspecific abnormalities  with no evidence of acute ischemia  ____________________________________________  RADIOLOGY  Chest x-ray interpreted by me shows no focal consolidation or edema CT angio chest:   IMPRESSION: 1. Bilateral pulmonary emboli, with the most proximal clot seen at the lobar level on the right. Positive for acute PE with CT evidence of right heart strain (RV/LV Ratio = 1.0) consistent with at least submassive (intermediate risk) PE. The presence of right heart strain has been associated with an increased risk of morbidity and mortality. Please refer to the "PE Focused" order set in EPIC. Critical Value/emergent results were called by telephone at the time of interpretation on 01/12/2021 at 2:32 pm to provider [facility] , who verbally acknowledged these results. 2. Slight progression of pulmonary metastases.  CT neck:  IMPRESSION: 1. Evaluation is degraded in the absence of intravenous contrast. 2. Interval increase in bulk of the laryngeal/hypopharyngeal malignancy as detailed above now with complete effacement of the laryngeal airway. 3. No definite evidence of the laryngeal cartilages or extralaryngeal extension. 4. Slight interval decrease in size of some enlarged right cervical chain lymph nodes as above. No new or progressive cervical lymphadenopathy.  ____________________________________________   PROCEDURES  Procedure(s) performed: No  Procedures  Critical Care performed: Yes  CRITICAL CARE Performed by: Arta Silence   Total critical care time: 30 minutes  Critical care time was exclusive of separately billable procedures and treating other patients.  Critical care was necessary to treat or prevent imminent or life-threatening deterioration.  Critical care was time spent personally by me on the following activities: development of treatment plan with patient and/or surrogate as well as nursing, discussions with consultants, evaluation of patient's  response to treatment, examination of patient, obtaining history from patient or surrogate, ordering and performing treatments and interventions, ordering and review of laboratory studies, ordering and review of radiographic studies, pulse oximetry and re-evaluation of patient's condition.  ____________________________________________   INITIAL IMPRESSION / ASSESSMENT AND PLAN / ED COURSE  Pertinent labs & imaging results that were available during my care of the patient were reviewed by me and considered in my medical decision making (see chart for details).   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  I reviewed the past medical records in Epic.  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  Plan at that time was continued chemotherapy.  He had a tracheotomy on 8/12.  He was last seen by Dr. Grayland Ormond on 9/20 and was started on doxycycline at that time for prophylaxis of possible superinfection of the right forearm where a chemotherapy extravasated.  On exam currently, the patient is overall well-appearing.  His vital signs are normal.  The physical exam is as described above.  Chest pain/shortness of breath: Differential includes musculoskeletal pain, progression of his cancer, COVID-19 or other infection, or less likely ACS or PE.  Chest x-ray is unrevealing.  We will obtain CT angio of the chest and CT soft tissue of the neck as well as basic labs and cardiac enzymes and reassess.  ----------------------------------------- 3:10 PM on 01/12/2021 -----------------------------------------  CT shows bilateral  submassive PE.  I initially ordered Lovenox.  I consulted Dr. Tobie Poet from the hospitalist service for admission given the patient's elevated risk, and based on discussion with her she will switch this to heparin infusion.  At this time, the patient is hemodynamically stable and I do not feel he requires ICU admission.  Dr. Tobie Poet will consult vascular surgery.  I discussed the results of the work-up with the patient and he agrees with admission.  ____________________________________________   FINAL CLINICAL IMPRESSION(S) / ED DIAGNOSES  Final diagnoses:  Acute pulmonary embolism, unspecified pulmonary embolism type, unspecified whether acute cor pulmonale present ([place])      NEW MEDICATIONS STARTED DURING THIS VISIT:  New Prescriptions   No medications on file     Note:  This document was prepared using Dragon voice recognition software and may include unintentional dictation errors.    Arta Silence, MD 01/12/21 1513

## 2021-01-13 ENCOUNTER — Encounter: Payer: Self-pay | Admitting: Internal Medicine

## 2021-01-13 ENCOUNTER — Encounter
Admission: EM | Disposition: A | Discharge status: Home / Self Care | Payer: Self-pay | Source: Home / Self Care | Attending: Internal Medicine

## 2021-01-13 ENCOUNTER — Other Ambulatory Visit (INDEPENDENT_AMBULATORY_CARE_PROVIDER_SITE_OTHER): Payer: Self-pay | Admitting: Vascular Surgery

## 2021-01-13 ENCOUNTER — Inpatient Hospital Stay
Admit: 2021-01-13 | Discharge: 2021-01-13 | Disposition: A | Discharge status: Home / Self Care | Payer: 59 | Attending: Internal Medicine | Admitting: Internal Medicine

## 2021-01-13 ENCOUNTER — Ambulatory Visit: Payer: 59

## 2021-01-13 DIAGNOSIS — I2609 Other pulmonary embolism with acute cor pulmonale: Secondary | ICD-10-CM

## 2021-01-13 DIAGNOSIS — I2699 Other pulmonary embolism without acute cor pulmonale: Secondary | ICD-10-CM | POA: Diagnosis not present

## 2021-01-13 DIAGNOSIS — R0902 Hypoxemia: Secondary | ICD-10-CM

## 2021-01-13 HISTORY — PX: PULMONARY THROMBECTOMY: CATH118295

## 2021-01-13 LAB — CBC WITH DIFFERENTIAL/PLATELET
Abs Immature Granulocytes: 0.02 10*3/uL (ref 0.00–0.07)
Basophils Absolute: 0 10*3/uL (ref 0.0–0.1)
Basophils Relative: 1 %
Eosinophils Absolute: 0.1 10*3/uL (ref 0.0–0.5)
Eosinophils Relative: 5 %
HCT: 31.6 % — ABNORMAL LOW (ref 39.0–52.0)
Hemoglobin: 10.6 g/dL — ABNORMAL LOW (ref 13.0–17.0)
Immature Granulocytes: 1 %
Lymphocytes Relative: 26 %
Lymphs Abs: 0.5 10*3/uL — ABNORMAL LOW (ref 0.7–4.0)
MCH: 32.3 pg (ref 26.0–34.0)
MCHC: 33.5 g/dL (ref 30.0–36.0)
MCV: 96.3 fL (ref 80.0–100.0)
Monocytes Absolute: 0.3 10*3/uL (ref 0.1–1.0)
Monocytes Relative: 16 %
Neutro Abs: 1 10*3/uL — ABNORMAL LOW (ref 1.7–7.7)
Neutrophils Relative %: 51 %
Platelets: 206 10*3/uL (ref 150–400)
RBC: 3.28 MIL/uL — ABNORMAL LOW (ref 4.22–5.81)
RDW: 13 % (ref 11.5–15.5)
WBC: 2 10*3/uL — ABNORMAL LOW (ref 4.0–10.5)
nRBC: 0 % (ref 0.0–0.2)

## 2021-01-13 LAB — HEPARIN LEVEL (UNFRACTIONATED)
Heparin Unfractionated: >1.1 IU/mL — ABNORMAL HIGH (ref 0.30–0.70)
Heparin Unfractionated: 0.64 IU/mL (ref 0.30–0.70)
Heparin Unfractionated: 0.48 IU/mL (ref 0.30–0.70)

## 2021-01-13 LAB — BASIC METABOLIC PANEL
Anion gap: 10 (ref 5–15)
BUN: 15 mg/dL (ref 8–23)
CO2: 28 mmol/L (ref 22–32)
Calcium: 9 mg/dL (ref 8.9–10.3)
Chloride: 101 mmol/L (ref 98–111)
Creatinine, Ser: 0.82 mg/dL (ref 0.61–1.24)
GFR, Estimated: >60 mL/min (ref >60)
Glucose, Bld: 128 mg/dL — ABNORMAL HIGH (ref 70–99)
Potassium: 4.1 mmol/L (ref 3.5–5.1)
Sodium: 139 mmol/L (ref 135–145)

## 2021-01-13 LAB — ECHOCARDIOGRAM COMPLETE
AR max vel: 2.17 cm2
AV Area VTI: 2.57 cm2
AV Area mean vel: 2.17 cm2
AV Mean grad: 3 mmHg
AV Peak grad: 5.7 mmHg
Ao pk vel: 1.19 m/s
Area-P 1/2: 3.24 cm2
Height: 70 in
S' Lateral: 2.6 cm
Weight: 3200 oz

## 2021-01-13 LAB — MAGNESIUM: Magnesium: 2 mg/dL (ref 1.7–2.4)

## 2021-01-13 SURGERY — PULMONARY THROMBECTOMY
Anesthesia: Moderate Sedation | Laterality: Bilateral

## 2021-01-13 MED ORDER — FENTANYL CITRATE (PF) 100 MCG/2ML IJ SOLN
INTRAMUSCULAR | Status: DC | PRN
  Administered 2021-01-13 (×2): 25 ug via INTRAVENOUS

## 2021-01-13 MED ORDER — FENTANYL CITRATE (PF) 100 MCG/2ML IJ SOLN
INTRAMUSCULAR | Status: AC
  Filled 2021-01-13: qty 2

## 2021-01-13 MED ORDER — MIDAZOLAM HCL 2 MG/ML PO SYRP: 8.0000 mg | ORAL_SOLUTION | Freq: Once | ORAL | Status: DC | PRN

## 2021-01-13 MED ORDER — METHYLPREDNISOLONE SODIUM SUCC 125 MG IJ SOLR: 125.0000 mg | Freq: Once | INTRAMUSCULAR | Status: DC | PRN

## 2021-01-13 MED ORDER — MIDAZOLAM HCL 2 MG/2ML IJ SOLN
INTRAMUSCULAR | Status: DC | PRN
  Administered 2021-01-13 (×2): 1 mg via INTRAVENOUS

## 2021-01-13 MED ORDER — DIPHENHYDRAMINE HCL 50 MG/ML IJ SOLN: 50.0000 mg | Freq: Once | INTRAMUSCULAR | Status: DC | PRN

## 2021-01-13 MED ORDER — HEPARIN (PORCINE) 25000 UT/250ML-% IV SOLN
1200.0000 [IU]/h | INTRAVENOUS | Status: DC
  Administered 2021-01-13 (×2): 1200 [IU]/h via INTRAVENOUS
  Filled 2021-01-13: qty 250

## 2021-01-13 MED ORDER — ONDANSETRON HCL 4 MG/2ML IJ SOLN: 4.0000 mg | Freq: Four times a day (QID) | INTRAMUSCULAR | Status: DC | PRN

## 2021-01-13 MED ORDER — IODIXANOL 320 MG/ML IV SOLN
INTRAVENOUS | Status: DC | PRN
  Administered 2021-01-13: 75 mL via INTRAVENOUS

## 2021-01-13 MED ORDER — ALTEPLASE 2 MG IJ SOLR
INTRAMUSCULAR | Status: AC
  Filled 2021-01-13: qty 6

## 2021-01-13 MED ORDER — HEPARIN (PORCINE) 25000 UT/250ML-% IV SOLN
1200.0000 [IU]/h | INTRAVENOUS | Status: DC
  Administered 2021-01-13 – 2021-01-15 (×2): 1200 [IU]/h via INTRAVENOUS
  Filled 2021-01-13 (×2): qty 250

## 2021-01-13 MED ORDER — CEFAZOLIN SODIUM-DEXTROSE 1-4 GM/50ML-% IV SOLN
INTRAVENOUS | Status: DC | PRN
  Administered 2021-01-13: 2 g via INTRAVENOUS

## 2021-01-13 MED ORDER — FAMOTIDINE 20 MG PO TABS: 40.0000 mg | ORAL_TABLET | Freq: Once | ORAL | Status: DC | PRN

## 2021-01-13 MED ORDER — HEPARIN (PORCINE) 25000 UT/250ML-% IV SOLN: 1200.0000 [IU]/h | INTRAVENOUS | Status: DC

## 2021-01-13 MED ORDER — CEFAZOLIN SODIUM-DEXTROSE 2-4 GM/100ML-% IV SOLN: 2.0000 g | Freq: Once | INTRAVENOUS | Status: DC

## 2021-01-13 MED ORDER — CEFAZOLIN SODIUM-DEXTROSE 1-4 GM/50ML-% IV SOLN
1.0000 g | INTRAVENOUS | Status: DC
  Filled 2021-01-13: qty 50

## 2021-01-13 MED ORDER — HEPARIN SODIUM (PORCINE) 1000 UNIT/ML IJ SOLN
INTRAMUSCULAR | Status: DC | PRN
  Administered 2021-01-13: 3000 [IU] via INTRAVENOUS

## 2021-01-13 MED ORDER — HYDROMORPHONE HCL 1 MG/ML IJ SOLN
1.0000 mg | Freq: Once | INTRAMUSCULAR | Status: DC | PRN
Start: 2021-01-13 — End: 2021-01-14

## 2021-01-13 MED ORDER — HEPARIN SODIUM (PORCINE) 1000 UNIT/ML IJ SOLN
INTRAMUSCULAR | Status: AC
  Filled 2021-01-13: qty 1

## 2021-01-13 MED ORDER — SODIUM CHLORIDE 0.9 % IV SOLN: INTRAVENOUS | Status: DC

## 2021-01-13 MED ORDER — MIDAZOLAM HCL 2 MG/2ML IJ SOLN
INTRAMUSCULAR | Status: AC
  Filled 2021-01-13: qty 4

## 2021-01-13 MED ORDER — FENTANYL CITRATE PF 50 MCG/ML IJ SOSY
PREFILLED_SYRINGE | INTRAMUSCULAR | Status: AC
  Filled 2021-01-13: qty 1

## 2021-01-13 SURGICAL SUPPLY — 19 items
CANISTER PENUMBRA ENGINE (MISCELLANEOUS) ×1 IMPLANT
CANNULA 5F STIFF (CANNULA) ×1 IMPLANT
CATH ANGIO 5F PIGTAIL 100CM (CATHETERS) ×1 IMPLANT
CATH INDIGO SEP 8 (CATHETERS) ×1 IMPLANT
CATH INFINITI JR4 5F (CATHETERS) ×1 IMPLANT
CATH LIGHTNING 8 XTORQ 115 (CATHETERS) ×1 IMPLANT
CATH SELECT H1 TIP 5F 130 (CATHETERS) ×1 IMPLANT
COVER PROBE U/S 5X48 (MISCELLANEOUS) ×1 IMPLANT
DEVICE SAFEGUARD 24CM (GAUZE/BANDAGES/DRESSINGS) ×1 IMPLANT
DEVICE TORQUE .025-.038 (MISCELLANEOUS) ×1 IMPLANT
GLIDEWIRE ANGLED SS 035X260CM (WIRE) ×1 IMPLANT
GLIDEWIRE STIFF .35X180X3 HYDR (WIRE) ×1 IMPLANT
PACK ANGIOGRAPHY (CUSTOM PROCEDURE TRAY) ×2 IMPLANT
SHEATH 9FRX11 (SHEATH) ×1 IMPLANT
SHEATH BRITE TIP 5FRX11 (SHEATH) ×1 IMPLANT
SYR MEDRAD MARK 7 150ML (SYRINGE) ×1 IMPLANT
TUBING CONTRAST HIGH PRESS 72 (TUBING) ×1 IMPLANT
WIRE AMPLATZ SSTIFF .035X260CM (WIRE) ×1 IMPLANT
WIRE GUIDERIGHT .035X150 (WIRE) ×1 IMPLANT

## 2021-01-13 NOTE — Consult Note (Signed)
ANTICOAGULATION CONSULT NOTE   Pharmacy Consult for heparin infusion Indication: pulmonary embolus  No Known Allergies  Patient Measurements: Height: 5\' 10"  (177.8 cm) Weight: 93 kg (205 lb) IBW/kg (Calculated) : 73   Vital Signs: Temp: 99.1 F (37.3 C) (09/27 1107) Temp Source: Oral (09/27 1107) BP: 114/64 (09/27 1400) Pulse Rate: 61 (09/27 1415)  Labs: Recent Labs    01/12/21 1037 01/12/21 1132 01/12/21 1630 01/12/21 2300 01/12/21 2317 01/13/21 0726 01/13/21 0816  HGB 11.9*  --   --   --  10.6*  --   --   HCT 35.2*  --   --   --  31.6*  --   --   PLT 159  --   --   --  206  --   --   APTT  --   --  30  --   --   --   --   LABPROT  --   --  13.3  --   --   --   --   INR  --   --  1.0  --   --   --   --   HEPARINUNFRC  --   --   --  >1.10*  --   --  0.64  CREATININE 0.93  --   --   --   --  0.82  --   TROPONINIHS  --  11  --   --   --   --   --      Estimated Creatinine Clearance: 104.3 mL/min (by C-G formula based on SCr of 0.82 mg/dL).   Medical History: Past Medical History:  Diagnosis Date   Cancer [facility])    Coronary artery disease    Hypertension     Medications:  No prior anticoagulation noted   Assessment: 65 y.o. male presenting with SOB. 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 Chest CT findings: Bilateral pulmonary emboli with CT evidence of right heart strain (RV/LV Ratio = 1.0) Pharmacy has consulted for heparin infusion for pulmonary embolism.   9/27:  HL @ 2300 = > 1.1   held and decreased to 1200 u/hr 9/27: 0816 HL= 0.64    therap x 1; cont at 1200u/hr  Goal of Therapy:  Heparin level 0.3-0.7 units/ml Monitor platelets by anticoagulation protocol: Yes   Plan:  Pt to cath lab. Per consult, restart heparin at previous rate (1200 units/hr) at 1530 with no bolus. Okay to follow nomogram thereafter.  Check HL in 6 hrs after restart CBC daily while on heparin   Sherilyn Banker,  PharmD Clinical Pharmacist   01/13/2021,2:26 PM

## 2021-01-13 NOTE — H&P (View-Only) (Signed)
[facility] Consult Note  MRN : [id number]  Scott Harding is a 65 y.o. 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   He reports the shortness of breath is worse with bending forward and with movement. He has had increased respiratory effort.  He reports the shortness of breath started approximately 1 week ago.  He denies feeling this way before.     He endorsed swelling of the right upper ext, increased warmth and redness of right extremity that started on 01/01/21.  He further endorses pruritus of the right upper extremity. He further he believes that his left lower extremity started swelling on 01/11/2021.  He is not 100% sure about this. He denies fever, chest pain, abdominal pain, dysuria, diarrhea, syncope, loss of consciousness.  Social history: He lives at home by himself. He is a former tobacco user, quit 15-20 years ago. At his peak, he was smoking 0.5 ppd. He is a former etoh user, drinking 6 beers infrequently, sosically. He denies recreational drug use. He was a truck/diesel mechanci.    Vaccination history: He is vaccinated for covid 19, one dose of J&J  Venous duplex of the upper/lower extremities were negative for DVT. CTA of the chest: Bilateral pulmonary emboli, with the most proximal clot seen at the lobar level on the right. Positive for acute PE with CT evidence of right heart strain (RV/LV Ratio = 1.0) consistent with at least submassive  (intermediate risk) PE.  Vascular surgery was consulted by Dr. Tobie Poet for possible endovascular intervention in the setting of PE.  Current Facility-Administered Medications  Medication Dose Route Frequency Provider Last Rate Last Admin   acetaminophen (TYLENOL) tablet 1,000 mg  1,000 mg Oral Q6H PRN Cox, Amy N, DO   1,000 mg at 01/12/21 2248   Or   acetaminophen (TYLENOL) suppository 650 mg  650 mg Rectal Q6H PRN Cox, Amy N, DO       ALPRAZolam (XANAX) tablet 0.5 mg  0.5 mg Oral Daily PRN Cox, Amy N, DO       amLODipine (NORVASC) tablet 10 mg  10 mg Oral Daily Cox, Amy N, DO   10 mg at 01/13/21 1012   calamine lotion   Topical BID Cox, Amy N, DO       [START ON 01/14/2021] ceFAZolin (ANCEF) IVPB 1 g/50 mL premix  1 g Intravenous On Call Schnier, Dolores Lory, MD       Chlorhexidine Gluconate Cloth 2 % PADS 6 each  6 each Topical Daily Cox, Amy N, DO       diphenhydrAMINE (BENADRYL) injection 25 mg  25 mg Intravenous Q8H PRN Cox, Amy N, DO       heparin ADULT infusion 100 units/mL (25000 units/261mL)  1,200 Units/hr Intravenous Continuous Cox, Amy N, DO 12 mL/hr at 01/13/21 1011 1,200 Units/hr at 01/13/21 1011   irbesartan (AVAPRO) tablet 300 mg  300 mg Oral Daily Cox, Amy N, DO   300 mg at 01/13/21 1012   And   hydrochlorothiazide (MICROZIDE) capsule 12.5 mg  12.5 mg Oral Daily Cox, Amy N, DO   12.5 mg at 01/13/21 1012   metoprolol succinate (TOPROL-XL) 24 hr tablet 25 mg  25 mg Oral Daily Cox, Amy N, DO   25 mg at 01/13/21 1012   ondansetron (ZOFRAN) injection 4 mg  4 mg Intravenous Q6H PRN Cox, Amy N, DO       prochlorperazine (COMPAZINE) tablet 10 mg  10 mg Oral Q6H PRN Cox, Amy N, DO       rosuvastatin (CRESTOR) tablet 40 mg  40 mg Oral Daily Cox, Amy N, DO       sodium chloride flush (NS) 0.9 % injection 10-40 mL  10-40 mL Intracatheter Q12H Cox, Amy N, DO   10 mL at 01/13/21 1012   sodium chloride flush (NS) 0.9 % injection 10-40 mL  10-40 mL Intracatheter PRN Cox, Amy N, DO       Current  Outpatient Medications  Medication Sig Dispense Refill   amLODipine (NORVASC) 10 MG tablet Take 1 tablet by mouth daily.     ascorbic acid (VITAMIN C) 500 MG tablet Take by mouth.     aspirin 81 MG EC tablet Take by mouth.     cyanocobalamin 1000 MCG tablet Take by mouth.     DENTA 5000 PLUS 1.1 % CREA dental cream BRUSH WITH PASTE 2 3 TIMES PER DAY FOR AT LEAST 1 MINUTE     doxycycline (VIBRA-TABS) 100 MG tablet Take 1 tablet (100 mg total) by mouth 2 (two) times daily. 14 tablet 0   metoprolol succinate (TOPROL-XL) 25 MG 24 hr tablet Take 1 tablet (25 mg total) by mouth daily. 30 tablet 0   olmesartan-hydrochlorothiazide (BENICAR HCT) 40-12.5 MG tablet Take 1 tablet by mouth daily.     rosuvastatin (CRESTOR) 40 MG tablet Take 40 mg by mouth daily.     ALPRAZolam (XANAX) 0.5 MG tablet Take 1 tablet (0.5 mg total) by mouth daily as needed for anxiety (Take 1 hour prior to radiation). 30 tablet 1   lidocaine-prilocaine (EMLA) cream Apply 1 application topically as needed. 30 g 0   ondansetron (ZOFRAN) 8 MG tablet Take 1 tablet (8 mg total) by mouth every 8 (eight) hours as needed for nausea or vomiting. 60 tablet 2   prochlorperazine (COMPAZINE) 10 MG tablet Take 1 tablet (10 mg total) by mouth every 6 (six) hours as needed for nausea or vomiting. 60 tablet 2   Facility-Administered Medications Ordered in Other Encounters  Medication Dose Route Frequency Provider Last Rate Last Admin   heparin lock flush 100 unit/mL  500 Units Intravenous Once Lloyd Huger, MD       sodium chloride flush (NS) 0.9 % injection 10 mL  10 mL Intravenous PRN Lloyd Huger, MD   10 mL at 01/06/21 1305   Past Medical History:  Diagnosis Date   Cancer [facility])    Coronary artery disease    Hypertension    Past Surgical History:  Procedure Laterality Date   CORONARY ARTERY BYPASS GRAFT  2010   Quadruple   DIRECT LARYNGOSCOPY  11/28/2020   Procedure: DIRECT LARYNGOSCOPY WITH BIOPSY;  Surgeon: Beverly Gust, MD;  Location: [facility] ORS;  Service: ENT;;   IR IMAGING GUIDED PORT INSERTION  01/01/2021   TRACHEOSTOMY TUBE PLACEMENT N/A 11/28/2020   Procedure: AWAKE TRACHEOSTOMY;  Surgeon: Beverly Gust, MD;  Location: [facility] ORS;  Service: ENT;  Laterality: N/A;   Social History Social History   Tobacco Use   Smoking status:  Former    Types: Cigarettes    Quit date: 10/17/2008    Years since quitting: 12.2   Smokeless tobacco: Never  Vaping Use   Vaping Use: Never used  Substance Use Topics   Alcohol use: Yes    Comment: occasionally   Drug use: Never   Family History Family History  Problem Relation Age of Onset   Arthritis Mother    Heart attack Father    Brain cancer Sister   Denies family history of peripheral artery disease, venous disease renal disease.  No Known Allergies  REVIEW OF SYSTEMS (Negative unless checked)  Constitutional: [] Weight loss  [] Fever  [] Chills Cardiac: [] Chest pain   [] Chest pressure   [] Palpitations   [x] Shortness of breath when laying flat   [x] Shortness of breath at rest   [x] Shortness of breath with exertion. Vascular:  [] Pain in legs with walking   [] Pain in legs at rest   [] Pain in legs when laying flat   [] Claudication   [] Pain in feet when walking  [] Pain in feet at rest  [] Pain in feet when laying flat   [] History of DVT   [] Phlebitis   [x] Swelling in legs   [] Varicose veins   [] Non-healing ulcers Pulmonary:   [] Uses home oxygen   [] Productive cough   [] Hemoptysis   [] Wheeze  [] COPD   [] Asthma Neurologic:  [] Dizziness  [] Blackouts   [] Seizures   [] History of stroke   [] History of TIA  [] Aphasia   [] Temporary blindness   [] Dysphagia   [] Weakness or numbness in arms   [] Weakness or numbness in legs Musculoskeletal:  [] Arthritis   [] Joint swelling   [] Joint pain   [] Low back pain Hematologic:  [] Easy bruising  [] Easy bleeding   [] Hypercoagulable state   [] Anemic  [] Hepatitis Gastrointestinal:  [] Blood in stool   [] Vomiting blood  [] Gastroesophageal  reflux/heartburn   [] Difficulty swallowing. Genitourinary:  [] Chronic kidney disease   [] Difficult urination  [] Frequent urination  [] Burning with urination   [] Blood in urine Skin:  [] Rashes   [] Ulcers   [] Wounds Psychological:  [] History of anxiety   []  History of major depression.  Physical Examination  Vitals:   01/13/21 0830 01/13/21 0900 01/13/21 0930 01/13/21 1000  BP: 118/63 (!) 109/57 127/70 126/67  Pulse: 64 (!) 56 (!) 58 60  Resp: (!) 22 (!) 22 15 (!) 27  Temp:      TempSrc:      SpO2: 100% 97% 98% 99%  Weight:      Height:       Body mass index is 28.7 kg/m. Gen:  WD/WN, NAD Head: [place]/AT, No temporalis wasting. Prominent temp pulse not noted. Ear/Nose/Throat: Hearing grossly intact, nares w/o erythema or drainage, oropharynx w/o Erythema/Exudate Eyes: Sclera non-icteric, conjunctiva clear Neck: Trachea midline.  No JVD.  Pulmonary:  Has a trach. Respirations not labored, equal bilaterally.  Patient is able to speak without issue at rest. Cardiac: RRR, normal S1, S2. Vascular:  Vessel Right Left  Radial Palpable Palpable  Ulnar Palpable Palpable  Brachial Palpable Palpable  Carotid Palpable, without bruit Palpable, without bruit  Aorta Not palpable N/A  Femoral Palpable Palpable  Popliteal Palpable Palpable  PT Palpable Palpable  DP Palpable Palpable   Bilateral lower extremity: Thigh soft.  Calf soft.  Extremities warm distally toes.  Minimal edema.  Gastrointestinal: soft, non-tender/non-distended. No guarding/reflex.  Musculoskeletal: M/S 5/5 throughout.  Extremities without ischemic changes.  No deformity or atrophy. No edema. Neurologic: Sensation grossly intact in extremities.  Symmetrical.  Speech  is fluent. Motor exam as listed above. Psychiatric: Judgment intact, Mood & affect appropriate for pt's clinical situation. Dermatologic: No rashes or ulcers noted.  No cellulitis or open wounds. Lymph : No Cervical, Axillary, or Inguinal  lymphadenopathy.  CBC Lab Results  Component Value Date   WBC 2.0 (L) 01/12/2021   HGB 10.6 (L) 01/12/2021   HCT 31.6 (L) 01/12/2021   MCV 96.3 01/12/2021   PLT 206 01/12/2021   BMET    Component Value Date/Time   NA 139 01/13/2021 0726   K 4.1 01/13/2021 0726   CL 101 01/13/2021 0726   CO2 28 01/13/2021 0726   GLUCOSE 128 (H) 01/13/2021 0726   BUN 15 01/13/2021 0726   CREATININE 0.82 01/13/2021 0726   CALCIUM 9.0 01/13/2021 0726   GFRNONAA >60 01/13/2021 0726   GFRAA 37 (L) 11/13/2019 0957   Estimated Creatinine Clearance: 103.1 mL/min (by C-G formula based on SCr of 0.82 mg/dL).  COAG Lab Results  Component Value Date   INR 1.0 01/12/2021   INR 1.0 09/25/2018   Radiology DG Chest 2 View  Result Date: 01/12/2021 CLINICAL DATA:  Shortness of breath. EXAM: CHEST - 2 VIEW COMPARISON:  Chest radiograph 11/28/2020.  PET-CT 12/16/2020. FINDINGS: Tracheostomy tube remains in place overlying the airway. A right jugular Port-A-Cath terminates over the lower SVC. Sequelae of CABG are again identified. The cardiomediastinal silhouette is within normal limits. There is mild chronic coarsening of the interstitial markings. No acute airspace consolidation, edema, pleural effusion, pneumothorax is identified. Bilateral lung nodules were more fully evaluated on the prior PET-CT. No acute osseous abnormality is seen. IMPRESSION: No active cardiopulmonary disease. Electronically Signed   By: Logan Bores M.D.   On: 01/12/2021 10:02   CT Soft Tissue Neck Wo Contrast  Result Date: 01/12/2021 CLINICAL DATA:  History of supraglottic squamous cell carcinoma EXAM: CT NECK WITHOUT CONTRAST TECHNIQUE: Multidetector CT imaging of the neck was performed following the standard protocol without intravenous contrast. COMPARISON:  CT neck 11/27/2020 FINDINGS: Evaluation is degraded in the absence of intravenous contrast. Pharynx and larynx: The nasal cavity and nasopharynx are unremarkable. The oral cavity  and oropharynx appears stable, without evidence of recurrent oropharyngeal disease. There is bulky thickening of the epiglottis, supraglottic laryngeal mucosa, larynx, and hypopharyngeal mucosa. There is involvement of the paraglottic fat bilaterally with extension to the anterior commissure. There suspected infraglottic extension as before. There is complete effacement of the airway at the level of the larynx, increased since the prior study (2-72). The laryngeal cartilage is similar in appearance, without definite evidence of invasion. There is no evidence of extra laryngeal extension. Salivary glands: The parotid and submandibular glands are unremarkable. Thyroid: Unremarkable. Lymph nodes: A partially calcified left level II a lymph node measuring up to 1.6 cm AP x 1.7 cm TV by 2.1 cm cc appears slightly increased in size in the coronal plane. The apparent slight decrease in size in the axial plane may be due to differences in slice plane acquisition. The 1.1 cm by 1.2 cm by 1.5 cm right level IIa lymph node is minimally decreased in size in the axial plane. A 1.0 cm AP x 1.5 cm TV by 1.3 cm cc right level V lymph node appears minimally decreased in size (2-73, 6-75). The previously described right level III lymph node is not well evaluated in the absence of intravenous contrast but appears grossly similar. The previously described asymmetric right supraclavicular lymph node has decreased in size from 0.6 cm in short  axis 2 approximately 0.3 cm (2-89). There are no new or enlarging cervical chain lymph nodes. Vascular: There is calcified atherosclerotic plaque of the carotid bulbs bilaterally, suboptimally evaluated in the absence of intravenous contrast. Limited intracranial: The imaged portions of the intracranial compartment are unremarkable. Visualized orbits: The imaged globes and orbits are unremarkable. Mastoids and visualized paranasal sinuses: There is a right maxillary sinus mucous retention cyst,  unchanged. The imaged paranasal sinuses are otherwise clear. The mastoid air cells are clear. Skeleton: There is multilevel degenerative change of the cervical spine, most advanced at C5-C6. There is no acute osseous abnormality or aggressive osseous lesion. Upper chest: A tracheostomy tube is in place terminating in the midthoracic trachea. Median sternotomy wires are noted. There is a right chest wall port in place. The lungs are assessed on the separately dictated CTA chest. Other: None. IMPRESSION: 1. Evaluation is degraded in the absence of intravenous contrast. 2. Interval increase in bulk of the laryngeal/hypopharyngeal malignancy as detailed above now with complete effacement of the laryngeal airway. 3. No definite evidence of the laryngeal cartilages or extralaryngeal extension. 4. Slight interval decrease in size of some enlarged right cervical chain lymph nodes as above. No new or progressive cervical lymphadenopathy. Electronically Signed   By: Valetta Mole M.D.   On: 01/12/2021 14:26   CT Angio Chest PE W and/or Wo Contrast  Result Date: 01/12/2021 CLINICAL DATA:  Shortness of breath and chest pain. EXAM: CT ANGIOGRAPHY CHEST WITH CONTRAST TECHNIQUE: Multidetector CT imaging of the chest was performed using the standard protocol during bolus administration of intravenous contrast. Multiplanar CT image reconstructions and MIPs were obtained to evaluate the vascular anatomy. CONTRAST:  47mL OMNIPAQUE IOHEXOL 350 MG/ML SOLN COMPARISON:  PET 12/16/2020. FINDINGS: Cardiovascular: Image quality is degraded by respiratory motion. There are filling defects in the pulmonary arteries bilaterally, with the most proximal clot seen at the lobar level on the right. RV/LV ratio is 1.0. Right IJ Port-A-Cath terminates in the right atrium. Heart is enlarged. No pericardial effusion. Mediastinum/Nodes: No pathologically enlarged mediastinal, hilar or axillary lymph nodes. Esophagus is grossly unremarkable.  Lungs/Pleura: Bilateral pulmonary nodules measure slightly larger. Index nodule in the subpleural right middle lobe measures 1.7 cm (6/46), previously 1.5 cm. Second index nodule in the right lower lobe measures 2.2 cm (6/63), previously 1.8 cm. No pleural fluid. Tracheostomy in place. Upper Abdomen: Visualized portions of the liver, gallbladder, adrenal glands, spleen, pancreas, stomach and bowel are grossly unremarkable. 1.4 cm juxtadiaphragmatic lymph node (4/66), similar to 12/16/2020. Musculoskeletal: Degenerative changes in the spine. No worrisome lytic or sclerotic lesions. Review of the MIP images confirms the above findings. IMPRESSION: 1. Bilateral pulmonary emboli, with the most proximal clot seen at the lobar level on the right. Positive for acute PE with CT evidence of right heart strain (RV/LV Ratio = 1.0) consistent with at least submassive (intermediate risk) PE. The presence of right heart strain has been associated with an increased risk of morbidity and mortality. Please refer to the "PE Focused" order set in EPIC. Critical Value/emergent results were called by telephone at the time of interpretation on 01/12/2021 at 2:32 pm to provider [facility] , who verbally acknowledged these results. 2. Slight progression of pulmonary metastases. Electronically Signed   By: Lorin Picket M.D.   On: 01/12/2021 14:36   NM PET Image Restag (PS) Skull Base To Thigh  Result Date: 12/17/2020 CLINICAL DATA:  Subsequent treatment strategy for supraglottic squamous cell carcinoma LEFT tonsil. EXAM: NUCLEAR MEDICINE PET  SKULL BASE TO THIGH TECHNIQUE: 10.7 mCi F-18 FDG was injected intravenously. Full-ring PET imaging was performed from the skull base to thigh after the radiotracer. CT data was obtained and used for attenuation correction and anatomic localization. Fasting blood glucose: 76 mg/dl COMPARISON:  PET-CT scan 11/12/2019, 09/18/2018.  CT neck 11/27/2020 FINDINGS: Mediastinal blood pool activity:  SUV max 1.9 Liver activity: SUV max NA NECK: Intensely hypermetabolic circumferential thickened tissue in the hypopharynx extending from the glottis superiorly to the base of tongue. The near entirety of the hypopharynx is involved by the hypermetabolic thickened tissue SUV max equal 22.6. Enlarged and hypermetabolic LEFT and RIGHT level II lymph nodes. Lymph nodes on the RIGHT are larger measuring 1.4 cm with SUV max equal 15.6 (image 56). Smaller hypermetabolic nodes LEFT level II with SUV max equal 5.3. There is intense metabolic activity associated with the tracheostomy tract. Incidental CT findings: none CHEST: Hypermetabolic RIGHT upper lobe pulmonary nodule measures 12 mm (image 100) with SUV max equal 8.2. Nodule in the RIGHT middle lobe measures 14 mm (image 123) with SUV max equal 7.2. Additional hypermetabolic nodules clustered in the central RIGHT lower lobe with SUV max equal 4.1 on image 117. Incidental CT findings: none ABDOMEN/PELVIS: No abnormal hypermetabolic activity within the liver, pancreas, adrenal glands, or spleen. No hypermetabolic lymph nodes in the abdomen or pelvis. Incidental CT findings: none SKELETON: No focal hypermetabolic activity to suggest skeletal metastasis. Incidental CT findings: none IMPRESSION: 1. Intensely hypermetabolic and thickened circumferential tissue in the hypopharynx extending from the base of the tongue to the glottis most = consistent with squamous cell carcinoma recurrence. 2. Bilateral hypermetabolic level II  nodal metastasis. 3. Multifocal hypermetabolic nodular metastasis in the RIGHT lung. 4. Hypermetabolic activity along the tracheostomy tract with differential including inflammation versus malignancy. Electronically Signed   By: Suzy Bouchard M.D.   On: 12/17/2020 14:31   US Venous Img Lower Bilateral (DVT)  Result Date: 01/12/2021 CLINICAL DATA:  65 year old male with a history pulmonary embolism EXAM: BILATERAL LOWER EXTREMITY VENOUS DOPPLER  ULTRASOUND TECHNIQUE: Gray-scale sonography with graded compression, as well as color Doppler and duplex ultrasound were performed to evaluate the lower extremity deep venous systems from the level of the common femoral vein and including the common femoral, femoral, profunda femoral, popliteal and calf veins including the posterior tibial, peroneal and gastrocnemius veins when visible. The superficial great saphenous vein was also interrogated. Spectral Doppler was utilized to evaluate flow at rest and with distal augmentation maneuvers in the common femoral, femoral and popliteal veins. COMPARISON:  None. FINDINGS: RIGHT LOWER EXTREMITY Common Femoral Vein: No evidence of thrombus. Normal compressibility, respiratory phasicity and response to augmentation. Saphenofemoral Junction: No evidence of thrombus. Normal compressibility and flow on color Doppler imaging. Profunda Femoral Vein: No evidence of thrombus. Normal compressibility and flow on color Doppler imaging. Femoral Vein: No evidence of thrombus. Normal compressibility, respiratory phasicity and response to augmentation. Popliteal Vein: No evidence of thrombus. Normal compressibility, respiratory phasicity and response to augmentation. Calf Veins: No evidence of thrombus. Normal compressibility and flow on color Doppler imaging. Superficial Great Saphenous Vein: No evidence of thrombus. Normal compressibility and flow on color Doppler imaging. Other Findings:  None. LEFT LOWER EXTREMITY Common Femoral Vein: No evidence of thrombus. Normal compressibility, respiratory phasicity and response to augmentation. Saphenofemoral Junction: No evidence of thrombus. Normal compressibility and flow on color Doppler imaging. Profunda Femoral Vein: No evidence of thrombus. Normal compressibility and flow on color Doppler imaging. Femoral Vein: No evidence  of thrombus. Normal compressibility, respiratory phasicity and response to augmentation. Popliteal Vein: No evidence  of thrombus. Normal compressibility, respiratory phasicity and response to augmentation. Calf Veins: No evidence of thrombus. Normal compressibility and flow on color Doppler imaging. Superficial Great Saphenous Vein: No evidence of thrombus. Normal compressibility and flow on color Doppler imaging. Other Findings:  None. IMPRESSION: Sonographic survey of the bilateral lower extremities negative for DVT Electronically Signed   By: Corrie Mckusick D.O.   On: 01/12/2021 16:32   US Venous Img Upper Bilat (DVT)  Result Date: 01/12/2021 CLINICAL DATA:  Pulmonary embolus, metastatic head and neck cancer EXAM: BILATERAL UPPER EXTREMITY VENOUS DOPPLER ULTRASOUND TECHNIQUE: Gray-scale sonography with graded compression, as well as color Doppler and duplex ultrasound were performed to evaluate the bilateral upper extremity deep venous systems from the level of the subclavian vein and including the jugular, axillary, basilic, radial, ulnar and upper cephalic vein. Spectral Doppler was utilized to evaluate flow at rest and with distal augmentation maneuvers. COMPARISON:  None. FINDINGS: RIGHT UPPER EXTREMITY Internal Jugular Vein: No evidence of thrombus. Normal compressibility, respiratory phasicity and response to augmentation. Subclavian Vein: Not well visualized secondary to overlying bandage. Axillary Vein: No evidence of thrombus. Normal compressibility, respiratory phasicity and response to augmentation. Cephalic Vein: No evidence of thrombus. Normal compressibility, respiratory phasicity and response to augmentation. Basilic Vein: No evidence of thrombus. Normal compressibility, respiratory phasicity and response to augmentation. Brachial Veins: No evidence of thrombus. Normal compressibility, respiratory phasicity and response to augmentation. Radial Veins: No evidence of thrombus. Normal compressibility, respiratory phasicity and response to augmentation. Ulnar Veins: No evidence of thrombus. Normal compressibility,  respiratory phasicity and response to augmentation. LEFT UPPER EXTREMITY Internal Jugular Vein: No evidence of thrombus. Normal compressibility, respiratory phasicity and response to augmentation. Subclavian Vein: No evidence of thrombus. Normal compressibility, respiratory phasicity and response to augmentation. Axillary Vein: No evidence of thrombus. Normal compressibility, respiratory phasicity and response to augmentation. Cephalic Vein: No evidence of thrombus. Normal compressibility, respiratory phasicity and response to augmentation. Basilic Vein: No evidence of thrombus. Normal compressibility, respiratory phasicity and response to augmentation. Brachial Veins: No evidence of thrombus. Normal compressibility, respiratory phasicity and response to augmentation. Radial Veins: No evidence of thrombus. Normal compressibility, respiratory phasicity and response to augmentation. Ulnar Veins: No evidence of thrombus. Normal compressibility, respiratory phasicity and response to augmentation. IMPRESSION: 1. Nonvisualization of the right subclavian vein due to overlying bandage. 2. Otherwise no evidence of deep venous thrombosis within either upper extremity. Electronically Signed   By: Randa Ngo M.D.   On: 01/12/2021 21:13   IR IMAGING GUIDED PORT INSERTION  Result Date: 01/01/2021 INDICATION: 65 year old with tonsillar squamous cell carcinoma. Port-A-Cath needed for chemotherapy. EXAM: FLUOROSCOPIC AND ULTRASOUND GUIDED PLACEMENT OF A SUBCUTANEOUS PORT COMPARISON:  None. MEDICATIONS: Moderate sedation ANESTHESIA/SEDATION: Versed 1.0 mg IV; Fentanyl 50 mcg IV; Moderate Sedation Time:  50 minutes The patient was continuously monitored during the procedure by the interventional radiology nurse under my direct supervision. FLUOROSCOPY TIME:  1 minute, 12 seconds COMPLICATIONS: None immediate. PROCEDURE: The procedure, risks, benefits, and alternatives were explained to the patient. Questions regarding the  procedure were encouraged and answered. The patient understands and consents to the procedure. Patient was placed supine on the interventional table. Ultrasound confirmed a patent right internal jugular vein. Ultrasound image was saved for documentation. The right chest and neck were cleaned with a skin antiseptic and a sterile drape was placed. Maximal barrier sterile technique was utilized including caps, mask, sterile gowns,  sterile gloves, sterile drape, hand hygiene and skin antiseptic. The right neck was anesthetized with 1% lidocaine. Small incision was made in the right neck with a blade. Micropuncture set was placed in the right internal jugular vein with ultrasound guidance. The micropuncture wire was used for measurement purposes. The right chest was anesthetized with 1% lidocaine with epinephrine. #15 blade was used to make an incision and a subcutaneous port pocket was formed. [place] was assembled. Subcutaneous tunnel was formed with a stiff tunneling device. The port catheter was brought through the subcutaneous tunnel. The port was placed in the subcutaneous pocket. The micropuncture set was exchanged for a peel-away sheath. The catheter was placed through the peel-away sheath and the tip was positioned at the superior cavoatrial junction. Catheter placement was confirmed with fluoroscopy. The port was accessed and flushed with heparinized saline. The port pocket was closed using two layers of absorbable sutures and Dermabond. The vein skin site was closed using a single layer of absorbable suture and Dermabond. Sterile dressings were applied. Patient tolerated the procedure well without an immediate complication. Ultrasound and fluoroscopic images were taken and saved for this procedure. FINDINGS: Patent right internal jugular vein. Enlarged lymph nodes adjacent to the right internal jugular vein. IMPRESSION: Placement of a subcutaneous power-injectable port device. Catheter tip at the  superior cavoatrial junction. Electronically Signed   By: Markus Daft M.D.   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  1.  Pulmonary Embolism: Patient presents with progressively worsening shortness of breath.  Patient notes that his dyspnea on exertion worsens with ambulation.  He patient does have a past medical history of head and neck cancer and is s/p a Conservation officer, historic buildings.  The patient was also experiencing left lower extremity swelling.  The patient notes that the swelling and discomfort in his lower extremity improved with initiation of heparin.  No DVT to the upper or lower extremity on duplex.  In the setting of pulmonary embolism with large clot burden and right heart strain recommend patient undergo a pulmonary thrombectomy/thrombolysis and attempt to lessen the clot burden, improve his symptoms and reduce the stress on his cardiovascular system.  Even more so with a tracheostomy in place.  Procedure, risks and benefits were explained to the patient and his family member at the bedside.  All questions were answered.  The patient wishes to proceed.  We will plan on this today with Dr. Delana Meyer.  Agree with initiation of heparin.  2.  Need for Anticoagulation: With a recent diagnosis of pulmonary embolism and the patient will be on oral anticoagulation for at least a year. He understands that he will be on heparin and we will transition that to most likely Eliquis tomorrow  3.  S/p Tracheostomy Placement: Malignant neoplasm of the supraglottis Patient has a past medical  history of head and neck cancer.  Discussed with Dr. Francene Castle, PA-C 01/13/2021 10:34 AM  This note was created with Dragon medical transcription system.  Any error is purely unintentional.

## 2021-01-13 NOTE — ED Notes (Signed)
Pt taken to [facility] at this time.

## 2021-01-13 NOTE — Progress Notes (Addendum)
PROGRESS NOTE   HPI was taken from Dr. Tobie Poet: Scott Harding is a 65 y.o. 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   He reports the shortness of breath is worse with bending forward and with movement. He has had increased respiratory effort.  He reports the shortness of breath started approximately 1 week ago.  He denies feeling this way before.     He endorsed swelling of the right upper ext, increased warmth and redness of right extremity that started on 01/01/21.  He further endorses pruritus of the right upper extremity. He further he believes that his left lower extremity started swelling on 01/11/2021.  He is not 100% sure about this.   He denies fever, chest pain, abdominal pain, dysuria, diarrhea, syncope, loss of consciousness.  Social history: He lives at home by himself. He is a former tobacco user, quit 15-20 years ago. At his peak, he was smoking 0.5 ppd. He is a former etoh user, drinking 6 beers infrequently, sosically. HE denies recreational drug use. He was a truck/diesel mechanci.    Scott Harding  UGQ:[id number] DOB: [birth year] DOA: 01/12/2021 PCP: Idelle Crouch, MD   Assessment & Plan:   Principal Problem:   Pulmonary embolism [facility]) Active Problems:   Benign essential hypertension   CAD (coronary artery disease) of bypass graft   Hyperlipemia, mixed   Ischemic cardiomyopathy   Squamous cell carcinoma of left tonsil (HCC)   Laryngeal mass   Malignant neoplasm of supraglottis (HCC)   Hypokalemia  Bilateral pulmonary emboli: as CT read. Echo shows EF 88-28%, normal diastolic function, no wall motion abnormalities & no right heart strain  noted. Possible thrombectomy by vascular surg today. [place] neg for b/l LE DVTs. Continue on IV heparin drip and likely switch to eliquis tomorrow. Pill must be small and potentially crushable as pt has a trach unable to swallow large pills whole   Squamous cell carcinoma of left tonsils, supraglottis & laryngeal mass: s/p trach. Recently started chemo. Management per onco outpatient    Hypokalemia: WNL today    Hypomagnesemia: WNL today    Neutropenia: no clinical suspicion for infectious etiology at this time. Likely secondary to carboplatin, paclitaxel infusion recently    Anxiety: severity unknown. Xanax prn    Right upper extremity pruritus: continue on calamine lotion  & benadryl prn    HLD: continue statin   HTN: continue on home dose of amlodipine, metoprolol, HCTZ-olmesartan or hospital substitute    DVT prophylaxis: heparin drip  Code Status: full  Family Communication: discussed pt's care w/ pt's family at bedside and answered their questions  Disposition Plan: likely d/c back home .  Level of care: Stepdown   Status is: Inpatient  Remains inpatient appropriate because:Ongoing diagnostic testing needed not appropriate for outpatient work up, IV treatments appropriate due to intensity of illness or inability to take PO, and Inpatient level of care appropriate due to severity of illness  Dispo: The patient is from: Home              Anticipated d/c is to: Home              Patient currently is not medically stable to d/c.   Difficult to place patient :  unclear    Consultants:  Vascular surg   Procedures: thrombectomy today   Antimicrobials:    Subjective: Pt c/o shortness of breath   Objective: Vitals:   01/13/21 0130 01/13/21 0400 01/13/21 0430 01/13/21 0500  BP: 105/60 113/69 110/66 109/60  Pulse: (!) 56 61 (!) 57 (!) 53  Resp: 16 17 19 17   Temp:      TempSrc:      SpO2: 96% 96% 97% 98%  Weight:      Height:       No intake or output data in the 24  hours ending 01/13/21 0832 Filed Weights   01/12/21 0931  Weight: 90.7 kg    Examination:  General exam: Appears calm and comfortable. Trach in place   Respiratory system: diminished breath sounds.  Cardiovascular system: S1 & S2 +. No rubs, gallops or clicks.  Gastrointestinal system: Abdomen is nondistended, soft and nontender. Normal bowel sounds heard. Central nervous system: Alert and oriented. Moves all extremities  Psychiatry: Judgement and insight appear normal. Mood & affect appropriate.     Data Reviewed: I have personally reviewed following labs and imaging studies  CBC: Recent Labs  Lab 01/06/21 1248 01/12/21 1037 01/12/21 2317  WBC 4.1 1.3* 2.0*  NEUTROABS 3.2  --  1.0*  HGB 11.0* 11.9* 10.6*  HCT 34.0* 35.2* 31.6*  MCV 97.7 96.7 96.3  PLT 186 159 254   Basic Metabolic Panel: Recent Labs  Lab 01/06/21 1248 01/12/21 1037 01/12/21 2202 01/13/21 0726  NA 137 139  --  139  K 3.5 2.9*  --  4.1  CL 102 111  --  101  CO2 28 22  --  28  GLUCOSE 108* 87  --  128*  BUN 19 16  --  15  CREATININE 1.22 0.93  --  0.82  CALCIUM 8.7* 7.2*  --  9.0  MG  --   --  1.6* 2.0   GFR: Estimated Creatinine Clearance: 103.1 mL/min (by C-G formula based on SCr of 0.82 mg/dL). Liver Function Tests: Recent Labs  Lab 01/06/21 1248  AST 17  ALT 22  ALKPHOS 33*  BILITOT 1.0  PROT 6.7  ALBUMIN 3.6   No results for input(s): LIPASE, AMYLASE in the last 168 hours. No results for input(s): AMMONIA in the last 168 hours. Coagulation Profile: Recent Labs  Lab 01/12/21 1630  INR 1.0   Cardiac Enzymes: No results for input(s): CKTOTAL, CKMB, CKMBINDEX, TROPONINI in the last 168 hours. BNP (last 3 results) No results for input(s): PROBNP in the last 8760 hours. HbA1C: No results for input(s): HGBA1C in the last 72 hours. CBG: No results for input(s): GLUCAP in the last 168 hours. Lipid Profile: No results for input(s): CHOL, HDL, LDLCALC, TRIG, CHOLHDL, LDLDIRECT in  the last 72 hours. Thyroid Function Tests: No results for input(s): TSH, T4TOTAL, FREET4, T3FREE, THYROIDAB in the last 72 hours. Anemia Panel: No results for input(s): VITAMINB12, FOLATE, FERRITIN, TIBC, IRON, RETICCTPCT in the last 72 hours. Sepsis Labs: No results for input(s): PROCALCITON, LATICACIDVEN in the last 168 hours.  Recent Results (from the past 240 hour(s))  Resp Panel by RT-PCR (Flu A&B, Covid) Nasopharyngeal Swab     Status: None   Collection Time: 01/12/21 12:16 PM   Specimen: Nasopharyngeal Swab; Nasopharyngeal(NP) swabs in vial transport medium  Result Value Ref Range Status   SARS Coronavirus 2 by RT PCR NEGATIVE NEGATIVE Final    Comment: (NOTE) SARS-CoV-2 target nucleic acids are NOT DETECTED.  The  SARS-CoV-2 RNA is generally detectable in upper respiratory specimens during the acute phase of infection. The lowest concentration of SARS-CoV-2 viral copies this assay can detect is 138 copies/mL. A negative result does not preclude SARS-Cov-2 infection and should not be used as the sole basis for treatment or other patient management decisions. A negative result may occur with  improper specimen collection/handling, submission of specimen other than nasopharyngeal swab, presence of viral mutation(s) within the areas targeted by this assay, and inadequate number of viral copies(<138 copies/mL). A negative result must be combined with clinical observations, patient history, and epidemiological information. The expected result is Negative.  Fact Sheet for Patients:  EntrepreneurPulse.com.au  Fact Sheet for Healthcare Providers:  IncredibleEmployment.be  This test is no t yet approved or cleared by the [place] FDA and  has been authorized for detection and/or diagnosis of SARS-CoV-2 by FDA under an Emergency Use Authorization (EUA). This EUA will remain  in effect (meaning this test can be used) for the duration of  the COVID-19 declaration under Section 564(b)(1) of the Act, 21 U.S.C.section 360bbb-3(b)(1), unless the authorization is terminated  or revoked sooner.       Influenza A by PCR NEGATIVE NEGATIVE Final   Influenza B by PCR NEGATIVE NEGATIVE Final    Comment: (NOTE) The Xpert Xpress SARS-CoV-2/FLU/RSV plus assay is intended as an aid in the diagnosis of influenza from Nasopharyngeal swab specimens and should not be used as a sole basis for treatment. Nasal washings and aspirates are unacceptable for Xpert Xpress SARS-CoV-2/FLU/RSV testing.  Fact Sheet for Patients: EntrepreneurPulse.com.au  Fact Sheet for Healthcare Providers: IncredibleEmployment.be  This test is not yet approved or cleared by the [place] FDA and has been authorized for detection and/or diagnosis of SARS-CoV-2 by FDA under an Emergency Use Authorization (EUA). This EUA will remain in effect (meaning this test can be used) for the duration of the COVID-19 declaration under Section 564(b)(1) of the Act, 21 U.S.C. section 360bbb-3(b)(1), unless the authorization is terminated or revoked.  Performed at [facility] [address] [place], [place] [address]          Radiology Studies: DG Chest 2 View  Result Date: 01/12/2021 CLINICAL DATA:  Shortness of breath. EXAM: CHEST - 2 VIEW COMPARISON:  Chest radiograph 11/28/2020.  PET-CT 12/16/2020. FINDINGS: Tracheostomy tube remains in place overlying the airway. A right jugular Port-A-Cath terminates over the lower SVC. Sequelae of CABG are again identified. The cardiomediastinal silhouette is within normal limits. There is mild chronic coarsening of the interstitial markings. No acute airspace consolidation, edema, pleural effusion, pneumothorax is identified. Bilateral lung nodules were more fully evaluated on the prior PET-CT. No acute osseous abnormality is seen. IMPRESSION: No active cardiopulmonary disease.  Electronically Signed   By: Logan Bores M.D.   On: 01/12/2021 10:02   CT Soft Tissue Neck Wo Contrast  Result Date: 01/12/2021 CLINICAL DATA:  History of supraglottic squamous cell carcinoma EXAM: CT NECK WITHOUT CONTRAST TECHNIQUE: Multidetector CT imaging of the neck was performed following the standard protocol without intravenous contrast. COMPARISON:  CT neck 11/27/2020 FINDINGS: Evaluation is degraded in the absence of intravenous contrast. Pharynx and larynx: The nasal cavity and nasopharynx are unremarkable. The oral cavity and oropharynx appears stable, without evidence of recurrent oropharyngeal disease. There is bulky thickening of the epiglottis, supraglottic laryngeal mucosa, larynx, and hypopharyngeal mucosa. There is involvement of the paraglottic fat bilaterally with extension to the anterior commissure. There suspected infraglottic extension as before. There is complete  effacement of the airway at the level of the larynx, increased since the prior study (2-72). The laryngeal cartilage is similar in appearance, without definite evidence of invasion. There is no evidence of extra laryngeal extension. Salivary glands: The parotid and submandibular glands are unremarkable. Thyroid: Unremarkable. Lymph nodes: A partially calcified left level II a lymph node measuring up to 1.6 cm AP x 1.7 cm TV by 2.1 cm cc appears slightly increased in size in the coronal plane. The apparent slight decrease in size in the axial plane may be due to differences in slice plane acquisition. The 1.1 cm by 1.2 cm by 1.5 cm right level IIa lymph node is minimally decreased in size in the axial plane. A 1.0 cm AP x 1.5 cm TV by 1.3 cm cc right level V lymph node appears minimally decreased in size (2-73, 6-75). The previously described right level III lymph node is not well evaluated in the absence of intravenous contrast but appears grossly similar. The previously described asymmetric right supraclavicular lymph node has  decreased in size from 0.6 cm in short axis 2 approximately 0.3 cm (2-89). There are no new or enlarging cervical chain lymph nodes. Vascular: There is calcified atherosclerotic plaque of the carotid bulbs bilaterally, suboptimally evaluated in the absence of intravenous contrast. Limited intracranial: The imaged portions of the intracranial compartment are unremarkable. Visualized orbits: The imaged globes and orbits are unremarkable. Mastoids and visualized paranasal sinuses: There is a right maxillary sinus mucous retention cyst, unchanged. The imaged paranasal sinuses are otherwise clear. The mastoid air cells are clear. Skeleton: There is multilevel degenerative change of the cervical spine, most advanced at C5-C6. There is no acute osseous abnormality or aggressive osseous lesion. Upper chest: A tracheostomy tube is in place terminating in the midthoracic trachea. Median sternotomy wires are noted. There is a right chest wall port in place. The lungs are assessed on the separately dictated CTA chest. Other: None. IMPRESSION: 1. Evaluation is degraded in the absence of intravenous contrast. 2. Interval increase in bulk of the laryngeal/hypopharyngeal malignancy as detailed above now with complete effacement of the laryngeal airway. 3. No definite evidence of the laryngeal cartilages or extralaryngeal extension. 4. Slight interval decrease in size of some enlarged right cervical chain lymph nodes as above. No new or progressive cervical lymphadenopathy. Electronically Signed   By: Valetta Mole M.D.   On: 01/12/2021 14:26   CT Angio Chest PE W and/or Wo Contrast  Result Date: 01/12/2021 CLINICAL DATA:  Shortness of breath and chest pain. EXAM: CT ANGIOGRAPHY CHEST WITH CONTRAST TECHNIQUE: Multidetector CT imaging of the chest was performed using the standard protocol during bolus administration of intravenous contrast. Multiplanar CT image reconstructions and MIPs were obtained to evaluate the vascular  anatomy. CONTRAST:  79mL OMNIPAQUE IOHEXOL 350 MG/ML SOLN COMPARISON:  PET 12/16/2020. FINDINGS: Cardiovascular: Image quality is degraded by respiratory motion. There are filling defects in the pulmonary arteries bilaterally, with the most proximal clot seen at the lobar level on the right. RV/LV ratio is 1.0. Right IJ Port-A-Cath terminates in the right atrium. Heart is enlarged. No pericardial effusion. Mediastinum/Nodes: No pathologically enlarged mediastinal, hilar or axillary lymph nodes. Esophagus is grossly unremarkable. Lungs/Pleura: Bilateral pulmonary nodules measure slightly larger. Index nodule in the subpleural right middle lobe measures 1.7 cm (6/46), previously 1.5 cm. Second index nodule in the right lower lobe measures 2.2 cm (6/63), previously 1.8 cm. No pleural fluid. Tracheostomy in place. Upper Abdomen: Visualized portions of the liver, gallbladder, adrenal  glands, spleen, pancreas, stomach and bowel are grossly unremarkable. 1.4 cm juxtadiaphragmatic lymph node (4/66), similar to 12/16/2020. Musculoskeletal: Degenerative changes in the spine. No worrisome lytic or sclerotic lesions. Review of the MIP images confirms the above findings. IMPRESSION: 1. Bilateral pulmonary emboli, with the most proximal clot seen at the lobar level on the right. Positive for acute PE with CT evidence of right heart strain (RV/LV Ratio = 1.0) consistent with at least submassive (intermediate risk) PE. The presence of right heart strain has been associated with an increased risk of morbidity and mortality. Please refer to the "PE Focused" order set in EPIC. Critical Value/emergent results were called by telephone at the time of interpretation on 01/12/2021 at 2:32 pm to provider [facility] , who verbally acknowledged these results. 2. Slight progression of pulmonary metastases. Electronically Signed   By: Lorin Picket M.D.   On: 01/12/2021 14:36   US Venous Img Lower Bilateral (DVT)  Result Date:  01/12/2021 CLINICAL DATA:  65 year old male with a history pulmonary embolism EXAM: BILATERAL LOWER EXTREMITY VENOUS DOPPLER ULTRASOUND TECHNIQUE: Gray-scale sonography with graded compression, as well as color Doppler and duplex ultrasound were performed to evaluate the lower extremity deep venous systems from the level of the common femoral vein and including the common femoral, femoral, profunda femoral, popliteal and calf veins including the posterior tibial, peroneal and gastrocnemius veins when visible. The superficial great saphenous vein was also interrogated. Spectral Doppler was utilized to evaluate flow at rest and with distal augmentation maneuvers in the common femoral, femoral and popliteal veins. COMPARISON:  None. FINDINGS: RIGHT LOWER EXTREMITY Common Femoral Vein: No evidence of thrombus. Normal compressibility, respiratory phasicity and response to augmentation. Saphenofemoral Junction: No evidence of thrombus. Normal compressibility and flow on color Doppler imaging. Profunda Femoral Vein: No evidence of thrombus. Normal compressibility and flow on color Doppler imaging. Femoral Vein: No evidence of thrombus. Normal compressibility, respiratory phasicity and response to augmentation. Popliteal Vein: No evidence of thrombus. Normal compressibility, respiratory phasicity and response to augmentation. Calf Veins: No evidence of thrombus. Normal compressibility and flow on color Doppler imaging. Superficial Great Saphenous Vein: No evidence of thrombus. Normal compressibility and flow on color Doppler imaging. Other Findings:  None. LEFT LOWER EXTREMITY Common Femoral Vein: No evidence of thrombus. Normal compressibility, respiratory phasicity and response to augmentation. Saphenofemoral Junction: No evidence of thrombus. Normal compressibility and flow on color Doppler imaging. Profunda Femoral Vein: No evidence of thrombus. Normal compressibility and flow on color Doppler imaging. Femoral Vein: No  evidence of thrombus. Normal compressibility, respiratory phasicity and response to augmentation. Popliteal Vein: No evidence of thrombus. Normal compressibility, respiratory phasicity and response to augmentation. Calf Veins: No evidence of thrombus. Normal compressibility and flow on color Doppler imaging. Superficial Great Saphenous Vein: No evidence of thrombus. Normal compressibility and flow on color Doppler imaging. Other Findings:  None. IMPRESSION: Sonographic survey of the bilateral lower extremities negative for DVT Electronically Signed   By: Corrie Mckusick D.O.   On: 01/12/2021 16:32   US Venous Img Upper Bilat (DVT)  Result Date: 01/12/2021 CLINICAL DATA:  Pulmonary embolus, metastatic head and neck cancer EXAM: BILATERAL UPPER EXTREMITY VENOUS DOPPLER ULTRASOUND TECHNIQUE: Gray-scale sonography with graded compression, as well as color Doppler and duplex ultrasound were performed to evaluate the bilateral upper extremity deep venous systems from the level of the subclavian vein and including the jugular, axillary, basilic, radial, ulnar and upper cephalic vein. Spectral Doppler was utilized to evaluate flow at rest and with  distal augmentation maneuvers. COMPARISON:  None. FINDINGS: RIGHT UPPER EXTREMITY Internal Jugular Vein: No evidence of thrombus. Normal compressibility, respiratory phasicity and response to augmentation. Subclavian Vein: Not well visualized secondary to overlying bandage. Axillary Vein: No evidence of thrombus. Normal compressibility, respiratory phasicity and response to augmentation. Cephalic Vein: No evidence of thrombus. Normal compressibility, respiratory phasicity and response to augmentation. Basilic Vein: No evidence of thrombus. Normal compressibility, respiratory phasicity and response to augmentation. Brachial Veins: No evidence of thrombus. Normal compressibility, respiratory phasicity and response to augmentation. Radial Veins: No evidence of thrombus. Normal  compressibility, respiratory phasicity and response to augmentation. Ulnar Veins: No evidence of thrombus. Normal compressibility, respiratory phasicity and response to augmentation. LEFT UPPER EXTREMITY Internal Jugular Vein: No evidence of thrombus. Normal compressibility, respiratory phasicity and response to augmentation. Subclavian Vein: No evidence of thrombus. Normal compressibility, respiratory phasicity and response to augmentation. Axillary Vein: No evidence of thrombus. Normal compressibility, respiratory phasicity and response to augmentation. Cephalic Vein: No evidence of thrombus. Normal compressibility, respiratory phasicity and response to augmentation. Basilic Vein: No evidence of thrombus. Normal compressibility, respiratory phasicity and response to augmentation. Brachial Veins: No evidence of thrombus. Normal compressibility, respiratory phasicity and response to augmentation. Radial Veins: No evidence of thrombus. Normal compressibility, respiratory phasicity and response to augmentation. Ulnar Veins: No evidence of thrombus. Normal compressibility, respiratory phasicity and response to augmentation. IMPRESSION: 1. Nonvisualization of the right subclavian vein due to overlying bandage. 2. Otherwise no evidence of deep venous thrombosis within either upper extremity. Electronically Signed   By: Randa Ngo M.D.   On: 01/12/2021 21:13        Scheduled Meds:  amLODipine  10 mg Oral Daily   calamine   Topical BID   Chlorhexidine Gluconate Cloth  6 each Topical Daily   irbesartan  300 mg Oral Daily   And   hydrochlorothiazide  12.5 mg Oral Daily   metoprolol succinate  25 mg Oral Daily   rosuvastatin  40 mg Oral Daily   sodium chloride flush  10-40 mL Intracatheter Q12H   Continuous Infusions:  heparin 1,200 Units/hr (01/13/21 0303)     LOS: 1 day    Time spent: 33 mins     Wyvonnia Dusky, MD [facility] Pager [phone]  If 7PM-7AM, please contact  night-coverage 01/13/2021, 8:32 AM

## 2021-01-13 NOTE — Plan of Care (Signed)

## 2021-01-13 NOTE — Consult Note (Signed)
Scott Harding  MRN : [id number]  Scott Harding is a 65 y.o. 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   He reports the shortness of breath is worse with bending forward and with movement. He has had increased respiratory effort.  He reports the shortness of breath started approximately 1 week ago.  He denies feeling this way before.     He endorsed swelling of the right upper ext, increased warmth and redness of right extremity that started on 01/01/21.  He further endorses pruritus of the right upper extremity. He further he believes that his left lower extremity started swelling on 01/11/2021.  He is not 100% sure about this. He denies fever, chest pain, abdominal pain, dysuria, diarrhea, syncope, loss of consciousness.  Social history: He lives at home by himself. He is a former tobacco user, quit 15-20 years ago. At his peak, he was smoking 0.5 ppd. He is a former etoh user, drinking 6 beers infrequently, sosically. He denies recreational drug use. He was a truck/diesel mechanci.    Vaccination history: He is vaccinated for covid 19, one dose of J&J  Venous duplex of the upper/lower extremities were negative for DVT. CTA of the chest: Bilateral pulmonary emboli, with the most proximal clot seen at the lobar level on the right. Positive for acute PE with CT evidence of right heart strain (RV/LV Ratio = 1.0) consistent with at least submassive  (intermediate risk) PE.  Vascular surgery was consulted by Dr. Tobie Poet for possible endovascular intervention in the setting of PE.  Current Facility-Administered Medications  Medication Dose Route Frequency Provider Last Rate Last Admin   acetaminophen (TYLENOL) tablet 1,000 mg  1,000 mg Oral Q6H PRN Cox, Amy N, DO   1,000 mg at 01/12/21 2248   Or   acetaminophen (TYLENOL) suppository 650 mg  650 mg Rectal Q6H PRN Cox, Amy N, DO       ALPRAZolam (XANAX) tablet 0.5 mg  0.5 mg Oral Daily PRN Cox, Amy N, DO       amLODipine (NORVASC) tablet 10 mg  10 mg Oral Daily Cox, Amy N, DO   10 mg at 01/13/21 1012   calamine lotion   Topical BID Cox, Amy N, DO       [START ON 01/14/2021] ceFAZolin (ANCEF) IVPB 1 g/50 mL premix  1 g Intravenous On Call Schnier, Dolores Lory, MD       Chlorhexidine Gluconate Cloth 2 % PADS 6 each  6 each Topical Daily Cox, Amy N, DO       diphenhydrAMINE (BENADRYL) injection 25 mg  25 mg Intravenous Q8H PRN Cox, Amy N, DO       heparin ADULT infusion 100 units/mL (25000 units/269mL)  1,200 Units/hr Intravenous Continuous Cox, Amy N, DO 12 mL/hr at 01/13/21 1011 1,200 Units/hr at 01/13/21 1011   irbesartan (AVAPRO) tablet 300 mg  300 mg Oral Daily Cox, Amy N, DO   300 mg at 01/13/21 1012   And   hydrochlorothiazide (MICROZIDE) capsule 12.5 mg  12.5 mg Oral Daily Cox, Amy N, DO   12.5 mg at 01/13/21 1012   metoprolol succinate (TOPROL-XL) 24 hr tablet 25 mg  25 mg Oral Daily Cox, Amy N, DO   25 mg at 01/13/21 1012   ondansetron (ZOFRAN) injection 4 mg  4 mg Intravenous Q6H PRN Cox, Amy N, DO       prochlorperazine (COMPAZINE) tablet 10 mg  10 mg Oral Q6H PRN Cox, Amy N, DO       rosuvastatin (CRESTOR) tablet 40 mg  40 mg Oral Daily Cox, Amy N, DO       sodium chloride flush (NS) 0.9 % injection 10-40 mL  10-40 mL Intracatheter Q12H Cox, Amy N, DO   10 mL at 01/13/21 1012   sodium chloride flush (NS) 0.9 % injection 10-40 mL  10-40 mL Intracatheter PRN Cox, Amy N, DO       Current  Outpatient Medications  Medication Sig Dispense Refill   amLODipine (NORVASC) 10 MG tablet Take 1 tablet by mouth daily.     ascorbic acid (VITAMIN C) 500 MG tablet Take by mouth.     aspirin 81 MG EC tablet Take by mouth.     cyanocobalamin 1000 MCG tablet Take by mouth.     DENTA 5000 PLUS 1.1 % CREA dental cream BRUSH WITH PASTE 2 3 TIMES PER DAY FOR AT LEAST 1 MINUTE     doxycycline (VIBRA-TABS) 100 MG tablet Take 1 tablet (100 mg total) by mouth 2 (two) times daily. 14 tablet 0   metoprolol succinate (TOPROL-XL) 25 MG 24 hr tablet Take 1 tablet (25 mg total) by mouth daily. 30 tablet 0   olmesartan-hydrochlorothiazide (BENICAR HCT) 40-12.5 MG tablet Take 1 tablet by mouth daily.     rosuvastatin (CRESTOR) 40 MG tablet Take 40 mg by mouth daily.     ALPRAZolam (XANAX) 0.5 MG tablet Take 1 tablet (0.5 mg total) by mouth daily as needed for anxiety (Take 1 hour prior to radiation). 30 tablet 1   lidocaine-prilocaine (EMLA) cream Apply 1 application topically as needed. 30 g 0   ondansetron (ZOFRAN) 8 MG tablet Take 1 tablet (8 mg total) by mouth every 8 (eight) hours as needed for nausea or vomiting. 60 tablet 2   prochlorperazine (COMPAZINE) 10 MG tablet Take 1 tablet (10 mg total) by mouth every 6 (six) hours as needed for nausea or vomiting. 60 tablet 2   Facility-Administered Medications Ordered in Other Encounters  Medication Dose Route Frequency Provider Last Rate Last Admin   heparin lock flush 100 unit/mL  500 Units Intravenous Once Lloyd Huger, MD       sodium chloride flush (NS) 0.9 % injection 10 mL  10 mL Intravenous PRN Lloyd Huger, MD   10 mL at 01/06/21 1305   Past Medical History:  Diagnosis Date   Cancer [facility])    Coronary artery disease    Hypertension    Past Surgical History:  Procedure Laterality Date   CORONARY ARTERY BYPASS GRAFT  2010   Quadruple   DIRECT LARYNGOSCOPY  11/28/2020   Procedure: DIRECT LARYNGOSCOPY WITH BIOPSY;  Surgeon: Beverly Gust, MD;  Location: [facility] ORS;  Service: ENT;;   IR IMAGING GUIDED PORT INSERTION  01/01/2021   TRACHEOSTOMY TUBE PLACEMENT N/A 11/28/2020   Procedure: AWAKE TRACHEOSTOMY;  Surgeon: Beverly Gust, MD;  Location: [facility] ORS;  Service: ENT;  Laterality: N/A;   Social History Social History   Tobacco Use   Smoking status:  Former    Types: Cigarettes    Quit date: 10/17/2008    Years since quitting: 12.2   Smokeless tobacco: Never  Vaping Use   Vaping Use: Never used  Substance Use Topics   Alcohol use: Yes    Comment: occasionally   Drug use: Never   Family History Family History  Problem Relation Age of Onset   Arthritis Mother    Heart attack Father    Brain cancer Sister   Denies family history of peripheral artery disease, venous disease renal disease.  No Known Allergies  REVIEW OF SYSTEMS (Negative unless checked)  Constitutional: [] Weight loss  [] Fever  [] Chills Cardiac: [] Chest pain   [] Chest pressure   [] Palpitations   [x] Shortness of breath when laying flat   [x] Shortness of breath at rest   [x] Shortness of breath with exertion. Vascular:  [] Pain in legs with walking   [] Pain in legs at rest   [] Pain in legs when laying flat   [] Claudication   [] Pain in feet when walking  [] Pain in feet at rest  [] Pain in feet when laying flat   [] History of DVT   [] Phlebitis   [x] Swelling in legs   [] Varicose veins   [] Non-healing ulcers Pulmonary:   [] Uses home oxygen   [] Productive cough   [] Hemoptysis   [] Wheeze  [] COPD   [] Asthma Neurologic:  [] Dizziness  [] Blackouts   [] Seizures   [] History of stroke   [] History of TIA  [] Aphasia   [] Temporary blindness   [] Dysphagia   [] Weakness or numbness in arms   [] Weakness or numbness in legs Musculoskeletal:  [] Arthritis   [] Joint swelling   [] Joint pain   [] Low back pain Hematologic:  [] Easy bruising  [] Easy bleeding   [] Hypercoagulable state   [] Anemic  [] Hepatitis Gastrointestinal:  [] Blood in stool   [] Vomiting blood  [] Gastroesophageal  reflux/heartburn   [] Difficulty swallowing. Genitourinary:  [] Chronic kidney disease   [] Difficult urination  [] Frequent urination  [] Burning with urination   [] Blood in urine Skin:  [] Rashes   [] Ulcers   [] Wounds Psychological:  [] History of anxiety   []  History of major depression.  Physical Examination  Vitals:   01/13/21 0830 01/13/21 0900 01/13/21 0930 01/13/21 1000  BP: 118/63 (!) 109/57 127/70 126/67  Pulse: 64 (!) 56 (!) 58 60  Resp: (!) 22 (!) 22 15 (!) 27  Temp:      TempSrc:      SpO2: 100% 97% 98% 99%  Weight:      Height:       Body mass index is 28.7 kg/m. Gen:  WD/WN, NAD Head: [place]/AT, No temporalis wasting. Prominent temp pulse not noted. Ear/Nose/Throat: Hearing grossly intact, nares w/o erythema or drainage, oropharynx w/o Erythema/Exudate Eyes: Sclera non-icteric, conjunctiva clear Neck: Trachea midline.  No JVD.  Pulmonary:  Has a trach. Respirations not labored, equal bilaterally.  Patient is able to speak without issue at rest. Cardiac: RRR, normal S1, S2. Vascular:  Vessel Right Left  Radial Palpable Palpable  Ulnar Palpable Palpable  Brachial Palpable Palpable  Carotid Palpable, without bruit Palpable, without bruit  Aorta Not palpable N/A  Femoral Palpable Palpable  Popliteal Palpable Palpable  PT Palpable Palpable  DP Palpable Palpable   Bilateral lower extremity: Thigh soft.  Calf soft.  Extremities warm distally toes.  Minimal edema.  Gastrointestinal: soft, non-tender/non-distended. No guarding/reflex.  Musculoskeletal: M/S 5/5 throughout.  Extremities without ischemic changes.  No deformity or atrophy. No edema. Neurologic: Sensation grossly intact in extremities.  Symmetrical.  Speech  is fluent. Motor exam as listed above. Psychiatric: Judgment intact, Mood & affect appropriate for pt's clinical situation. Dermatologic: No rashes or ulcers noted.  No cellulitis or open wounds. Lymph : No Cervical, Axillary, or Inguinal  lymphadenopathy.  CBC Lab Results  Component Value Date   WBC 2.0 (L) 01/12/2021   HGB 10.6 (L) 01/12/2021   HCT 31.6 (L) 01/12/2021   MCV 96.3 01/12/2021   PLT 206 01/12/2021   BMET    Component Value Date/Time   NA 139 01/13/2021 0726   K 4.1 01/13/2021 0726   CL 101 01/13/2021 0726   CO2 28 01/13/2021 0726   GLUCOSE 128 (H) 01/13/2021 0726   BUN 15 01/13/2021 0726   CREATININE 0.82 01/13/2021 0726   CALCIUM 9.0 01/13/2021 0726   GFRNONAA >60 01/13/2021 0726   GFRAA 37 (L) 11/13/2019 0957   Estimated Creatinine Clearance: 103.1 mL/min (by C-G formula based on SCr of 0.82 mg/dL).  COAG Lab Results  Component Value Date   INR 1.0 01/12/2021   INR 1.0 09/25/2018   Radiology DG Chest 2 View  Result Date: 01/12/2021 CLINICAL DATA:  Shortness of breath. EXAM: CHEST - 2 VIEW COMPARISON:  Chest radiograph 11/28/2020.  PET-CT 12/16/2020. FINDINGS: Tracheostomy tube remains in place overlying the airway. A right jugular Port-A-Cath terminates over the lower SVC. Sequelae of CABG are again identified. The cardiomediastinal silhouette is within normal limits. There is mild chronic coarsening of the interstitial markings. No acute airspace consolidation, edema, pleural effusion, pneumothorax is identified. Bilateral lung nodules were more fully evaluated on the prior PET-CT. No acute osseous abnormality is seen. IMPRESSION: No active cardiopulmonary disease. Electronically Signed   By: Logan Bores M.D.   On: 01/12/2021 10:02   CT Soft Tissue Neck Wo Contrast  Result Date: 01/12/2021 CLINICAL DATA:  History of supraglottic squamous cell carcinoma EXAM: CT NECK WITHOUT CONTRAST TECHNIQUE: Multidetector CT imaging of the neck was performed following the standard protocol without intravenous contrast. COMPARISON:  CT neck 11/27/2020 FINDINGS: Evaluation is degraded in the absence of intravenous contrast. Pharynx and larynx: The nasal cavity and nasopharynx are unremarkable. The oral cavity  and oropharynx appears stable, without evidence of recurrent oropharyngeal disease. There is bulky thickening of the epiglottis, supraglottic laryngeal mucosa, larynx, and hypopharyngeal mucosa. There is involvement of the paraglottic fat bilaterally with extension to the anterior commissure. There suspected infraglottic extension as before. There is complete effacement of the airway at the level of the larynx, increased since the prior study (2-72). The laryngeal cartilage is similar in appearance, without definite evidence of invasion. There is no evidence of extra laryngeal extension. Salivary glands: The parotid and submandibular glands are unremarkable. Thyroid: Unremarkable. Lymph nodes: A partially calcified left level II a lymph node measuring up to 1.6 cm AP x 1.7 cm TV by 2.1 cm cc appears slightly increased in size in the coronal plane. The apparent slight decrease in size in the axial plane may be due to differences in slice plane acquisition. The 1.1 cm by 1.2 cm by 1.5 cm right level IIa lymph node is minimally decreased in size in the axial plane. A 1.0 cm AP x 1.5 cm TV by 1.3 cm cc right level V lymph node appears minimally decreased in size (2-73, 6-75). The previously described right level III lymph node is not well evaluated in the absence of intravenous contrast but appears grossly similar. The previously described asymmetric right supraclavicular lymph node has decreased in size from 0.6 cm in short  axis 2 approximately 0.3 cm (2-89). There are no new or enlarging cervical chain lymph nodes. Vascular: There is calcified atherosclerotic plaque of the carotid bulbs bilaterally, suboptimally evaluated in the absence of intravenous contrast. Limited intracranial: The imaged portions of the intracranial compartment are unremarkable. Visualized orbits: The imaged globes and orbits are unremarkable. Mastoids and visualized paranasal sinuses: There is a right maxillary sinus mucous retention cyst,  unchanged. The imaged paranasal sinuses are otherwise clear. The mastoid air cells are clear. Skeleton: There is multilevel degenerative change of the cervical spine, most advanced at C5-C6. There is no acute osseous abnormality or aggressive osseous lesion. Upper chest: A tracheostomy tube is in place terminating in the midthoracic trachea. Median sternotomy wires are noted. There is a right chest wall port in place. The lungs are assessed on the separately dictated CTA chest. Other: None. IMPRESSION: 1. Evaluation is degraded in the absence of intravenous contrast. 2. Interval increase in bulk of the laryngeal/hypopharyngeal malignancy as detailed above now with complete effacement of the laryngeal airway. 3. No definite evidence of the laryngeal cartilages or extralaryngeal extension. 4. Slight interval decrease in size of some enlarged right cervical chain lymph nodes as above. No new or progressive cervical lymphadenopathy. Electronically Signed   By: Valetta Mole M.D.   On: 01/12/2021 14:26   CT Angio Chest PE W and/or Wo Contrast  Result Date: 01/12/2021 CLINICAL DATA:  Shortness of breath and chest pain. EXAM: CT ANGIOGRAPHY CHEST WITH CONTRAST TECHNIQUE: Multidetector CT imaging of the chest was performed using the standard protocol during bolus administration of intravenous contrast. Multiplanar CT image reconstructions and MIPs were obtained to evaluate the vascular anatomy. CONTRAST:  25mL OMNIPAQUE IOHEXOL 350 MG/ML SOLN COMPARISON:  PET 12/16/2020. FINDINGS: Cardiovascular: Image quality is degraded by respiratory motion. There are filling defects in the pulmonary arteries bilaterally, with the most proximal clot seen at the lobar level on the right. RV/LV ratio is 1.0. Right IJ Port-A-Cath terminates in the right atrium. Heart is enlarged. No pericardial effusion. Mediastinum/Nodes: No pathologically enlarged mediastinal, hilar or axillary lymph nodes. Esophagus is grossly unremarkable.  Lungs/Pleura: Bilateral pulmonary nodules measure slightly larger. Index nodule in the subpleural right middle lobe measures 1.7 cm (6/46), previously 1.5 cm. Second index nodule in the right lower lobe measures 2.2 cm (6/63), previously 1.8 cm. No pleural fluid. Tracheostomy in place. Upper Abdomen: Visualized portions of the liver, gallbladder, adrenal glands, spleen, pancreas, stomach and bowel are grossly unremarkable. 1.4 cm juxtadiaphragmatic lymph node (4/66), similar to 12/16/2020. Musculoskeletal: Degenerative changes in the spine. No worrisome lytic or sclerotic lesions. Review of the MIP images confirms the above findings. IMPRESSION: 1. Bilateral pulmonary emboli, with the most proximal clot seen at the lobar level on the right. Positive for acute PE with CT evidence of right heart strain (RV/LV Ratio = 1.0) consistent with at least submassive (intermediate risk) PE. The presence of right heart strain has been associated with an increased risk of morbidity and mortality. Please refer to the "PE Focused" order set in EPIC. Critical Value/emergent results were called by telephone at the time of interpretation on 01/12/2021 at 2:32 pm to provider [facility] , who verbally acknowledged these results. 2. Slight progression of pulmonary metastases. Electronically Signed   By: Lorin Picket M.D.   On: 01/12/2021 14:36   NM PET Image Restag (PS) Skull Base To Thigh  Result Date: 12/17/2020 CLINICAL DATA:  Subsequent treatment strategy for supraglottic squamous cell carcinoma LEFT tonsil. EXAM: NUCLEAR MEDICINE PET  SKULL BASE TO THIGH TECHNIQUE: 10.7 mCi F-18 FDG was injected intravenously. Full-ring PET imaging was performed from the skull base to thigh after the radiotracer. CT data was obtained and used for attenuation correction and anatomic localization. Fasting blood glucose: 76 mg/dl COMPARISON:  PET-CT scan 11/12/2019, 09/18/2018.  CT neck 11/27/2020 FINDINGS: Mediastinal blood pool activity:  SUV max 1.9 Liver activity: SUV max NA NECK: Intensely hypermetabolic circumferential thickened tissue in the hypopharynx extending from the glottis superiorly to the base of tongue. The near entirety of the hypopharynx is involved by the hypermetabolic thickened tissue SUV max equal 22.6. Enlarged and hypermetabolic LEFT and RIGHT level II lymph nodes. Lymph nodes on the RIGHT are larger measuring 1.4 cm with SUV max equal 15.6 (image 56). Smaller hypermetabolic nodes LEFT level II with SUV max equal 5.3. There is intense metabolic activity associated with the tracheostomy tract. Incidental CT findings: none CHEST: Hypermetabolic RIGHT upper lobe pulmonary nodule measures 12 mm (image 100) with SUV max equal 8.2. Nodule in the RIGHT middle lobe measures 14 mm (image 123) with SUV max equal 7.2. Additional hypermetabolic nodules clustered in the central RIGHT lower lobe with SUV max equal 4.1 on image 117. Incidental CT findings: none ABDOMEN/PELVIS: No abnormal hypermetabolic activity within the liver, pancreas, adrenal glands, or spleen. No hypermetabolic lymph nodes in the abdomen or pelvis. Incidental CT findings: none SKELETON: No focal hypermetabolic activity to suggest skeletal metastasis. Incidental CT findings: none IMPRESSION: 1. Intensely hypermetabolic and thickened circumferential tissue in the hypopharynx extending from the base of the tongue to the glottis most = consistent with squamous cell carcinoma recurrence. 2. Bilateral hypermetabolic level II  nodal metastasis. 3. Multifocal hypermetabolic nodular metastasis in the RIGHT lung. 4. Hypermetabolic activity along the tracheostomy tract with differential including inflammation versus malignancy. Electronically Signed   By: Suzy Bouchard M.D.   On: 12/17/2020 14:31   US Venous Img Lower Bilateral (DVT)  Result Date: 01/12/2021 CLINICAL DATA:  65 year old male with a history pulmonary embolism EXAM: BILATERAL LOWER EXTREMITY VENOUS DOPPLER  ULTRASOUND TECHNIQUE: Gray-scale sonography with graded compression, as well as color Doppler and duplex ultrasound were performed to evaluate the lower extremity deep venous systems from the level of the common femoral vein and including the common femoral, femoral, profunda femoral, popliteal and calf veins including the posterior tibial, peroneal and gastrocnemius veins when visible. The superficial great saphenous vein was also interrogated. Spectral Doppler was utilized to evaluate flow at rest and with distal augmentation maneuvers in the common femoral, femoral and popliteal veins. COMPARISON:  None. FINDINGS: RIGHT LOWER EXTREMITY Common Femoral Vein: No evidence of thrombus. Normal compressibility, respiratory phasicity and response to augmentation. Saphenofemoral Junction: No evidence of thrombus. Normal compressibility and flow on color Doppler imaging. Profunda Femoral Vein: No evidence of thrombus. Normal compressibility and flow on color Doppler imaging. Femoral Vein: No evidence of thrombus. Normal compressibility, respiratory phasicity and response to augmentation. Popliteal Vein: No evidence of thrombus. Normal compressibility, respiratory phasicity and response to augmentation. Calf Veins: No evidence of thrombus. Normal compressibility and flow on color Doppler imaging. Superficial Great Saphenous Vein: No evidence of thrombus. Normal compressibility and flow on color Doppler imaging. Other Findings:  None. LEFT LOWER EXTREMITY Common Femoral Vein: No evidence of thrombus. Normal compressibility, respiratory phasicity and response to augmentation. Saphenofemoral Junction: No evidence of thrombus. Normal compressibility and flow on color Doppler imaging. Profunda Femoral Vein: No evidence of thrombus. Normal compressibility and flow on color Doppler imaging. Femoral Vein: No evidence  of thrombus. Normal compressibility, respiratory phasicity and response to augmentation. Popliteal Vein: No evidence  of thrombus. Normal compressibility, respiratory phasicity and response to augmentation. Calf Veins: No evidence of thrombus. Normal compressibility and flow on color Doppler imaging. Superficial Great Saphenous Vein: No evidence of thrombus. Normal compressibility and flow on color Doppler imaging. Other Findings:  None. IMPRESSION: Sonographic survey of the bilateral lower extremities negative for DVT Electronically Signed   By: Corrie Mckusick D.O.   On: 01/12/2021 16:32   US Venous Img Upper Bilat (DVT)  Result Date: 01/12/2021 CLINICAL DATA:  Pulmonary embolus, metastatic head and neck cancer EXAM: BILATERAL UPPER EXTREMITY VENOUS DOPPLER ULTRASOUND TECHNIQUE: Gray-scale sonography with graded compression, as well as color Doppler and duplex ultrasound were performed to evaluate the bilateral upper extremity deep venous systems from the level of the subclavian vein and including the jugular, axillary, basilic, radial, ulnar and upper cephalic vein. Spectral Doppler was utilized to evaluate flow at rest and with distal augmentation maneuvers. COMPARISON:  None. FINDINGS: RIGHT UPPER EXTREMITY Internal Jugular Vein: No evidence of thrombus. Normal compressibility, respiratory phasicity and response to augmentation. Subclavian Vein: Not well visualized secondary to overlying bandage. Axillary Vein: No evidence of thrombus. Normal compressibility, respiratory phasicity and response to augmentation. Cephalic Vein: No evidence of thrombus. Normal compressibility, respiratory phasicity and response to augmentation. Basilic Vein: No evidence of thrombus. Normal compressibility, respiratory phasicity and response to augmentation. Brachial Veins: No evidence of thrombus. Normal compressibility, respiratory phasicity and response to augmentation. Radial Veins: No evidence of thrombus. Normal compressibility, respiratory phasicity and response to augmentation. Ulnar Veins: No evidence of thrombus. Normal compressibility,  respiratory phasicity and response to augmentation. LEFT UPPER EXTREMITY Internal Jugular Vein: No evidence of thrombus. Normal compressibility, respiratory phasicity and response to augmentation. Subclavian Vein: No evidence of thrombus. Normal compressibility, respiratory phasicity and response to augmentation. Axillary Vein: No evidence of thrombus. Normal compressibility, respiratory phasicity and response to augmentation. Cephalic Vein: No evidence of thrombus. Normal compressibility, respiratory phasicity and response to augmentation. Basilic Vein: No evidence of thrombus. Normal compressibility, respiratory phasicity and response to augmentation. Brachial Veins: No evidence of thrombus. Normal compressibility, respiratory phasicity and response to augmentation. Radial Veins: No evidence of thrombus. Normal compressibility, respiratory phasicity and response to augmentation. Ulnar Veins: No evidence of thrombus. Normal compressibility, respiratory phasicity and response to augmentation. IMPRESSION: 1. Nonvisualization of the right subclavian vein due to overlying bandage. 2. Otherwise no evidence of deep venous thrombosis within either upper extremity. Electronically Signed   By: Randa Ngo M.D.   On: 01/12/2021 21:13   IR IMAGING GUIDED PORT INSERTION  Result Date: 01/01/2021 INDICATION: 65 year old with tonsillar squamous cell carcinoma. Port-A-Cath needed for chemotherapy. EXAM: FLUOROSCOPIC AND ULTRASOUND GUIDED PLACEMENT OF A SUBCUTANEOUS PORT COMPARISON:  None. MEDICATIONS: Moderate sedation ANESTHESIA/SEDATION: Versed 1.0 mg IV; Fentanyl 50 mcg IV; Moderate Sedation Time:  50 minutes The patient was continuously monitored during the procedure by the interventional radiology nurse under my direct supervision. FLUOROSCOPY TIME:  1 minute, 12 seconds COMPLICATIONS: None immediate. PROCEDURE: The procedure, risks, benefits, and alternatives were explained to the patient. Questions regarding the  procedure were encouraged and answered. The patient understands and consents to the procedure. Patient was placed supine on the interventional table. Ultrasound confirmed a patent right internal jugular vein. Ultrasound image was saved for documentation. The right chest and neck were cleaned with a skin antiseptic and a sterile drape was placed. Maximal barrier sterile technique was utilized including caps, mask, sterile gowns,  sterile gloves, sterile drape, hand hygiene and skin antiseptic. The right neck was anesthetized with 1% lidocaine. Small incision was made in the right neck with a blade. Micropuncture set was placed in the right internal jugular vein with ultrasound guidance. The micropuncture wire was used for measurement purposes. The right chest was anesthetized with 1% lidocaine with epinephrine. #15 blade was used to make an incision and a subcutaneous port pocket was formed. Cable was assembled. Subcutaneous tunnel was formed with a stiff tunneling device. The port catheter was brought through the subcutaneous tunnel. The port was placed in the subcutaneous pocket. The micropuncture set was exchanged for a peel-away sheath. The catheter was placed through the peel-away sheath and the tip was positioned at the superior cavoatrial junction. Catheter placement was confirmed with fluoroscopy. The port was accessed and flushed with heparinized saline. The port pocket was closed using two layers of absorbable sutures and Dermabond. The vein skin site was closed using a single layer of absorbable suture and Dermabond. Sterile dressings were applied. Patient tolerated the procedure well without an immediate complication. Ultrasound and fluoroscopic images were taken and saved for this procedure. FINDINGS: Patent right internal jugular vein. Enlarged lymph nodes adjacent to the right internal jugular vein. IMPRESSION: Placement of a subcutaneous power-injectable port device. Catheter tip at the  superior cavoatrial junction. Electronically Signed   By: Markus Daft M.D.   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  1.  Pulmonary Embolism: Patient presents with progressively worsening shortness of breath.  Patient notes that his dyspnea on exertion worsens with ambulation.  He patient does have a past medical history of head and neck cancer and is s/p a Conservation officer, historic buildings.  The patient was also experiencing left lower extremity swelling.  The patient notes that the swelling and discomfort in his lower extremity improved with initiation of heparin.  No DVT to the upper or lower extremity on duplex.  In the setting of pulmonary embolism with large clot burden and right heart strain recommend patient undergo a pulmonary thrombectomy/thrombolysis and attempt to lessen the clot burden, improve his symptoms and reduce the stress on his cardiovascular system.  Even more so with a tracheostomy in place.  Procedure, risks and benefits were explained to the patient and his family member at the bedside.  All questions were answered.  The patient wishes to proceed.  We will plan on this today with Dr. Delana Meyer.  Agree with initiation of heparin.  2.  Need for Anticoagulation: With a recent diagnosis of pulmonary embolism and the patient will be on oral anticoagulation for at least a year. He understands that he will be on heparin and we will transition that to most likely Eliquis tomorrow  3.  S/p Tracheostomy Placement: Malignant neoplasm of the supraglottis Patient has a past medical  history of head and neck cancer.  Discussed with Dr. Francene Castle, PA-C 01/13/2021 10:34 AM  This Harding was created with Dragon medical transcription system.  Any error is purely unintentional.

## 2021-01-13 NOTE — Progress Notes (Signed)
*  PRELIMINARY RESULTS* Echocardiogram 2D Echocardiogram has been performed.  Scott Harding 01/13/2021, 8:10 AM

## 2021-01-13 NOTE — Consult Note (Signed)
ANTICOAGULATION CONSULT NOTE - Initial Consult  Pharmacy Consult for heparin infusion Indication: pulmonary embolus  No Known Allergies  Patient Measurements: Height: 5\' 10"  (177.8 cm) Weight: 90.7 kg (200 lb) IBW/kg (Calculated) : 73   Vital Signs: Temp: 98.8 F (37.1 C) (09/26 2231) Temp Source: Oral (09/26 2231) BP: 145/64 (09/26 2231) Pulse Rate: 75 (09/26 2231)  Labs: Recent Labs    01/12/21 1037 01/12/21 1132 01/12/21 1630 01/12/21 2300 01/12/21 2317  HGB 11.9*  --   --   --  10.6*  HCT 35.2*  --   --   --  31.6*  PLT 159  --   --   --  206  APTT  --   --  30  --   --   LABPROT  --   --  13.3  --   --   INR  --   --  1.0  --   --   HEPARINUNFRC  --   --   --  >1.10*  --   CREATININE 0.93  --   --   --   --   TROPONINIHS  --  11  --   --   --      Estimated Creatinine Clearance: 90.9 mL/min (by C-G formula based on SCr of 0.93 mg/dL).   Medical History: Past Medical History:  Diagnosis Date   Cancer [facility])    Coronary artery disease    Hypertension     Medications:  No prior anticoagulation noted   Assessment: 65 y.o. male presenting with SOB. 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 Chest CT findings: Bilateral pulmonary emboli with CT evidence of right heart strain (RV/LV Ratio = 1.0) Pharmacy has consulted for heparin infusion.   Goal of Therapy:  Heparin level 0.3-0.7 units/ml Monitor platelets by anticoagulation protocol: Yes   Plan:  9/27:  HL @ 2300 = > 1.1 Spoke with RN who drew sample , she states she paused drip for 15 min, flushed with saline before drawing and sending to lab.  Will take this result as valid.  Will hold heparin drip for 1 hr and restart @ 1200 units/hr @ 0230. Will recheck HL 6 hrs after restart.    Orene Desanctis, PharmD Clinical Pharmacist   01/13/2021,1:28 AM

## 2021-01-13 NOTE — Consult Note (Signed)
ANTICOAGULATION CONSULT NOTE   Pharmacy Consult for heparin infusion Indication: pulmonary embolus  No Known Allergies  Patient Measurements: Height: 5\' 10"  (177.8 cm) Weight: 90.7 kg (200 lb) IBW/kg (Calculated) : 73   Vital Signs: Temp: 98.8 F (37.1 C) (09/26 2231) Temp Source: Oral (09/26 2231) BP: 109/60 (09/27 0500) Pulse Rate: 53 (09/27 0500)  Labs: Recent Labs    01/12/21 1037 01/12/21 1132 01/12/21 1630 01/12/21 2300 01/12/21 2317 01/13/21 0726 01/13/21 0816  HGB 11.9*  --   --   --  10.6*  --   --   HCT 35.2*  --   --   --  31.6*  --   --   PLT 159  --   --   --  206  --   --   APTT  --   --  30  --   --   --   --   LABPROT  --   --  13.3  --   --   --   --   INR  --   --  1.0  --   --   --   --   HEPARINUNFRC  --   --   --  >1.10*  --   --  0.64  CREATININE 0.93  --   --   --   --  0.82  --   TROPONINIHS  --  11  --   --   --   --   --      Estimated Creatinine Clearance: 103.1 mL/min (by C-G formula based on SCr of 0.82 mg/dL).   Medical History: Past Medical History:  Diagnosis Date   Cancer [facility])    Coronary artery disease    Hypertension     Medications:  No prior anticoagulation noted   Assessment: 65 y.o. male presenting with SOB. 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 Chest CT findings: Bilateral pulmonary emboli with CT evidence of right heart strain (RV/LV Ratio = 1.0) Pharmacy has consulted for heparin infusion.   9/27:  HL @ 2300 = > 1.1   held and decreased to 1200 u/hr 9/27: 0816 HL= 0.64    therap x 1; cont at 1200u/hr  Goal of Therapy:  Heparin level 0.3-0.7 units/ml Monitor platelets by anticoagulation protocol: Yes   Plan:  9/27: 0816 HL  = 0.64 Will continue current rate of 1200 units/hr Will check confirmatory HL in  6 hrs    Kentravious Lipford A, PharmD Clinical Pharmacist   01/13/2021,9:38 AM

## 2021-01-13 NOTE — Op Note (Signed)
[facility]  Percutaneous Study/Intervention Procedural Note   Date of Surgery: 01/13/2021,1:16 PM  Surgeon:Scott Harding, Scott Harding   Pre-operative Diagnosis: Symptomatic bilateral pulmonary emboli with right heart strain and hypoxia  Post-operative diagnosis:  Same  Procedure(s) Performed:  1.  Selective injection right subsegmental pulmonary arteries; right  lower lobe pulmonary arteries.  2.  Selective injection left subsegmental pulmonary arteries; left lower lobe pulmonary arteries  3.  Mechanical thrombectomy bilateral lobar pulmonary arteries for removal of pulmonary emboli using the Penumbra CAT 8 thrombectomy catheter.    Anesthesia: Conscious sedation was administered under my direct supervision by the interventional radiology RN. IV Versed plus fentanyl were utilized. Continuous ECG, pulse oximetry and blood pressure was monitored throughout the entire procedure.  Conscious sedation was administered for a total of 1 hour 13 minutes and 19 seconds.  Sheath: 9 French 11 cm Pinnacle sheath  Contrast: 75 cc   Fluoroscopy Time: 14.8 minutes  Indications:  Patient presented to the hospital with hypoxia and shortness of breath. The patient is unable to get out of bed and transfer to a chair without increased dyspnea. The patient's O2 saturations off nasal cannula are in the 80%. CT angiogram demonstrated bilateral pulmonary emboli associated with significant right heart strain. Given the long-term sequela and the patient's symptomatic condition the risks and benefits for angiography with thrombectomy are reviewed all questions are answered, the patient agrees to proceed.  Procedure:  Scott Harding a 66 y.o. male who was identified and appropriate procedural time out was performed.  The patient was then placed supine on the table and prepped and draped in the usual sterile fashion.  Ultrasound was used to evaluate the right common femoral vein.  It was compressible  indicating it is patent .  A digital ultrasound image was acquired for the permanent record.  A micropuncture needle was used to access the right common femoral vein under direct ultrasound guidance.  A microwire was then advanced under fluoroscopic guidance followed by micro-sheath.  A 0.035 J wire was advanced without resistance and a 5Fr sheath was placed.    3000 units of heparin was then given and allowed to circulate.  The J-wire and pigtail catheter was advanced up to the right atrium where a bolus injection contrast was used to demonstrate the pulmonary artery outflow.  Stiff angled Glidewire was then exchanged for the J-wire and the pigtail catheter was exchanged for JR4 catheter. Using the combination of the stiff angled Glidewire and the JR4 catheter the pulmonary outflow track was selected.  The Penumbra Cat 8 extra torque catheter was then advanced into the thrombus in the right lower lobe pulmonary artery.  Hand-injection contrast was used to verify positioning and evaluate the distal anatomy.  Mechanical aspiration was performed using the CAT 8 catheter and a separator.  Hand-injection contrast was then used to give an assessment of the effectiveness of thrombectomy.   The Penumbra Cat 8 extra torque catheter was then advanced into the thrombus in the left lower lobe pulmonary artery.  Hand-injection contrast was used to verify the positioning and evaluate the distal anatomy.  Mechanical aspiration was performed using the CAT 8 catheter and a separator.  Hand-injection contrast was then performed to give an assessment of the left pulmonary vasculature and the effectiveness of thrombectomy.  Pigtail catheter was then reintroduced over the Amplatz wire after removing the penumbra catheter and positioned in the pulmonary outflow tract.  A bolus injection of contrast was then used to  create a final image of the pulmonary vasculature.  After review these images the catheter and sheath were  removed and pressure held. There were no immediate complications.  Findings:   Right pulmonary artery: Right upper and middle lobe were relatively free of thrombus.  The initial imaging of the right lower lobe demonstrated thrombus within the lobar branches and proximal subsegmental branches.  Following thrombectomy there is removal of approximately 80% of the thrombus.  There is now full filling of the right lower lobe.  Left pulmonary: The left upper lobe was free of thrombus.  Left lower lobe demonstrated to fairly large thrombus in the segmental branches.  These were individually selected and then addressed.  Follow-up imaging demonstrated essentially total resolution of the previously noted thrombus with full filling of the left lower lobe.    Disposition: Patient was taken to the recovery room in stable condition having tolerated the procedure well.  Scott Harding 01/13/2021,1:16 PM

## 2021-01-13 NOTE — Interval H&P Note (Signed)
History and Physical Interval Note:  01/13/2021 1:13 PM  Scott Harding  has presented today for surgery, with the diagnosis of PE with submassive.  The various methods of treatment have been discussed with the patient and family. After consideration of risks, benefits and other options for treatment, the patient has consented to  Procedure(s): PULMONARY THROMBECTOMY (Bilateral) as a surgical intervention.  The patient's history has been reviewed, patient examined, no change in status, stable for surgery.  I have reviewed the patient's chart and labs.  Questions were answered to the patient's satisfaction.     [name]

## 2021-01-13 NOTE — Progress Notes (Signed)
SLP Cancellation Note  Patient Details Name: Scott Harding MRN: [id number] DOB: 1955/08/07   Cancelled treatment:       Reason Eval/Treat Not Completed: SLP screened, no needs identified, will sign off  Pt is known to this Probation officer from previous admission. Chart reviewed and pt interviewed. Pt continues to be at baseline with good cognitive abilities as well as situational insight. PMV is tethered to trach collar but pt stated that it is not comfort when in place at this time. Pt also states that he wishes to remain on regular diet textures as he is able to demonstrate effective masticate of items into smaller pieces.   At this time, skilled ST services are not indicated during acute hospitalization but continue to be recommended as Outpatient for pharyngeal strengthening.   Aritzel Krusemark B. Rutherford Nail M.S., CCC-SLP, [place] Office [phone]  Joreen Swearingin Rutherford Nail 01/13/2021, 8:21 AM

## 2021-01-14 ENCOUNTER — Ambulatory Visit: Payer: 59

## 2021-01-14 ENCOUNTER — Encounter: Payer: Self-pay | Admitting: Vascular Surgery

## 2021-01-14 DIAGNOSIS — N179 Acute kidney failure, unspecified: Secondary | ICD-10-CM

## 2021-01-14 DIAGNOSIS — D709 Neutropenia, unspecified: Secondary | ICD-10-CM

## 2021-01-14 DIAGNOSIS — E785 Hyperlipidemia, unspecified: Secondary | ICD-10-CM

## 2021-01-14 DIAGNOSIS — I2699 Other pulmonary embolism without acute cor pulmonale: Secondary | ICD-10-CM | POA: Diagnosis not present

## 2021-01-14 LAB — BASIC METABOLIC PANEL
Anion gap: 8 (ref 5–15)
BUN: 22 mg/dL (ref 8–23)
CO2: 27 mmol/L (ref 22–32)
Calcium: 8.5 mg/dL — ABNORMAL LOW (ref 8.9–10.3)
Chloride: 103 mmol/L (ref 98–111)
Creatinine, Ser: 1.39 mg/dL — ABNORMAL HIGH (ref 0.61–1.24)
GFR, Estimated: 57 mL/min — ABNORMAL LOW (ref >60)
Glucose, Bld: 91 mg/dL (ref 70–99)
Potassium: 4.2 mmol/L (ref 3.5–5.1)
Sodium: 138 mmol/L (ref 135–145)

## 2021-01-14 LAB — CBC
HCT: 27.6 % — ABNORMAL LOW (ref 39.0–52.0)
Hemoglobin: 9.4 g/dL — ABNORMAL LOW (ref 13.0–17.0)
MCH: 33.3 pg (ref 26.0–34.0)
MCHC: 34.1 g/dL (ref 30.0–36.0)
MCV: 97.9 fL (ref 80.0–100.0)
Platelets: 198 10*3/uL (ref 150–400)
RBC: 2.82 MIL/uL — ABNORMAL LOW (ref 4.22–5.81)
RDW: 12.9 % (ref 11.5–15.5)
WBC: 2.7 10*3/uL — ABNORMAL LOW (ref 4.0–10.5)
nRBC: 0 % (ref 0.0–0.2)

## 2021-01-14 LAB — HEPARIN LEVEL (UNFRACTIONATED): Heparin Unfractionated: 0.5 IU/mL (ref 0.30–0.70)

## 2021-01-14 MED ORDER — ENSURE ENLIVE PO LIQD: 237.0000 mL | Freq: Two times a day (BID) | ORAL | Status: DC

## 2021-01-14 MED ORDER — IPRATROPIUM-ALBUTEROL 0.5-2.5 (3) MG/3ML IN SOLN
3.0000 mL | Freq: Four times a day (QID) | RESPIRATORY_TRACT | Status: DC
  Administered 2021-01-14 – 2021-01-15 (×5): 3 mL via RESPIRATORY_TRACT
  Filled 2021-01-14 (×4): qty 3

## 2021-01-14 MED ORDER — ADULT MULTIVITAMIN W/MINERALS CH
1.0000 | ORAL_TABLET | Freq: Every day | ORAL | Status: DC
  Administered 2021-01-14 – 2021-01-15 (×2): 1 via ORAL
  Filled 2021-01-14 (×2): qty 1

## 2021-01-14 MED ORDER — ENSURE ENLIVE PO LIQD
237.0000 mL | Freq: Three times a day (TID) | ORAL | Status: DC
  Administered 2021-01-14: 237 mL via ORAL

## 2021-01-14 MED ORDER — SODIUM CHLORIDE 0.9 % IV BOLUS
250.0000 mL | Freq: Once | INTRAVENOUS | Status: AC
  Administered 2021-01-14: 250 mL via INTRAVENOUS

## 2021-01-14 MED ORDER — SODIUM CHLORIDE 0.9 % IV SOLN: INTRAVENOUS | Status: DC

## 2021-01-14 NOTE — Consult Note (Signed)
ANTICOAGULATION CONSULT NOTE   Pharmacy Consult for heparin infusion Indication: pulmonary embolus  No Known Allergies  Patient Measurements: Height: 5\' 9"  (175.3 cm) Weight: 92 kg (202 lb 12.8 oz) IBW/kg (Calculated) : 70.7   Vital Signs: Temp: 97.2 F (36.2 C) (09/28 0732) Temp Source: Oral (09/27 2012) BP: 102/59 (09/28 0732) Pulse Rate: 56 (09/28 0732)  Labs: Recent Labs    01/12/21 1037 01/12/21 1132 01/12/21 1630 01/12/21 2300 01/12/21 2317 01/13/21 0726 01/13/21 0816 01/13/21 2245 01/14/21 0700  HGB 11.9*  --   --   --  10.6*  --   --   --  9.4*  HCT 35.2*  --   --   --  31.6*  --   --   --  27.6*  PLT 159  --   --   --  206  --   --   --  198  APTT  --   --  30  --   --   --   --   --   --   LABPROT  --   --  13.3  --   --   --   --   --   --   INR  --   --  1.0  --   --   --   --   --   --   HEPARINUNFRC  --   --   --    < >  --   --  0.64 0.48 0.50  CREATININE 0.93  --   --   --   --  0.82  --   --   --   TROPONINIHS  --  11  --   --   --   --   --   --   --    < > = values in this interval not displayed.     Estimated Creatinine Clearance: 102 mL/min (by C-G formula based on SCr of 0.82 mg/dL).   Medical History: Past Medical History:  Diagnosis Date   Cancer [facility])    Coronary artery disease    Hypertension     Medications:  No prior anticoagulation noted   Assessment: 65 y.o. male presenting with SOB. 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 Chest CT findings: Bilateral pulmonary emboli with CT evidence of right heart strain (RV/LV Ratio = 1.0) Pharmacy has consulted for heparin infusion for pulmonary embolism.   9/27:  HL @ 2300 = > 1.1   held and decreased to 1200 u/hr 9/27: 0816 HL= 0.64    therap x 1; cont at 1200u/hr 9/27: 2245 HL= 0.48, therapeutic x 2  9/28: 0700 HL= 0.50, therapeutic x 3  Goal of Therapy:  Heparin level 0.3-0.7 units/ml Monitor platelets by anticoagulation  protocol: Yes   Plan:  HL therapeutic, continue heparin at current rate of 1200 units/hr Recheck HL with AM labs CBC daily while on heparin infusion  Sherilyn Banker, PharmD Clinical Pharmacist   01/14/2021,7:48 AM

## 2021-01-14 NOTE — Consult Note (Signed)
ANTICOAGULATION CONSULT NOTE   Pharmacy Consult for heparin infusion Indication: pulmonary embolus  No Known Allergies  Patient Measurements: Height: 5\' 9"  (175.3 cm) Weight: 92 kg (202 lb 12.8 oz) IBW/kg (Calculated) : 70.7   Vital Signs: Temp: 98.9 F (37.2 C) (09/27 2012) Temp Source: Oral (09/27 2012) BP: 121/69 (09/27 2012) Pulse Rate: 72 (09/27 2334)  Labs: Recent Labs    01/12/21 1037 01/12/21 1132 01/12/21 1630 01/12/21 2300 01/12/21 2317 01/13/21 0726 01/13/21 0816 01/13/21 2245  HGB 11.9*  --   --   --  10.6*  --   --   --   HCT 35.2*  --   --   --  31.6*  --   --   --   PLT 159  --   --   --  206  --   --   --   APTT  --   --  30  --   --   --   --   --   LABPROT  --   --  13.3  --   --   --   --   --   INR  --   --  1.0  --   --   --   --   --   HEPARINUNFRC  --   --   --  >1.10*  --   --  0.64 0.48  CREATININE 0.93  --   --   --   --  0.82  --   --   TROPONINIHS  --  11  --   --   --   --   --   --      Estimated Creatinine Clearance: 102 mL/min (by C-G formula based on SCr of 0.82 mg/dL).   Medical History: Past Medical History:  Diagnosis Date   Cancer [facility])    Coronary artery disease    Hypertension     Medications:  No prior anticoagulation noted   Assessment: 65 y.o. male presenting with SOB. 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 Chest CT findings: Bilateral pulmonary emboli with CT evidence of right heart strain (RV/LV Ratio = 1.0) Pharmacy has consulted for heparin infusion for pulmonary embolism.   9/27:  HL @ 2300 = > 1.1   held and decreased to 1200 u/hr 9/27: 0816 HL= 0.64    therap x 1; cont at 1200u/hr 9/27: 2245 HL= 0.48, therapeutic X 2   Goal of Therapy:  Heparin level 0.3-0.7 units/ml Monitor platelets by anticoagulation protocol: Yes   Plan:  9/27:  HL @ 2245 = 0.48, therapeutic X 2 Will continue pt on current rate and recheck HL on 9/28 with AM labs.    Orene Desanctis, PharmD Clinical Pharmacist   01/14/2021,12:06 AM

## 2021-01-14 NOTE — Progress Notes (Signed)
Initial Nutrition Assessment  DOCUMENTATION CODES:  Not applicable  INTERVENTION:  Continue regular diet.  Add Ensure Plus High Protein po TID, each supplement provides 350 kcal and 20 grams of protein.   Add MVI with minerals daily.  Encourage PO intake.  NUTRITION DIAGNOSIS:  Increased nutrient needs related to cancer and cancer related treatments as evidenced by estimated needs.  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 9/27 - bilateral pulmonary thrombectomy  Spoke with pt and daughter at bedside. Daughter reports that the patient eats well and tries to eat despite not having an appetite most of the time. She reports he is familiar with chemo since he went through it in 2020 as well.  Pt reports that he has lost about 10-15 lbs in the past month or so once starting chemotherapy again. Discussed the importance of the intake of calories and protein at this time.  Daughter endorses that patient has Ensure at home in his fridge.  Per Epic, pt ate 50% of dinner last night and 100% of breakfast this morning.  Per Epic, pt has lost ~13 lbs (6.3%) in the past almost 8 months, which is not necessarily significant for the time frame.  Pt at risk for malnutrition given chemotherapy side effects.  RD to add Ensure Plus HP TID and MVI with minerals. Pt endorses being lactose-intolerant, assured pt that Ensure is lactose-free.  Medications: reviewed; NaCl @ 75 ml/hr  Labs: reviewed; Crt 1.39 (H)  NUTRITION - FOCUSED PHYSICAL EXAM: Flowsheet Row Most Recent Value  Orbital Region No depletion  Upper Arm Region No depletion  Thoracic and Lumbar Region No depletion   Buccal Region No depletion  Temple Region No depletion  Clavicle Bone Region No depletion  Clavicle and Acromion Bone Region No depletion  Scapular Bone Region No depletion  Dorsal Hand Mild depletion  Patellar Region No depletion  Anterior Thigh Region Mild depletion  Posterior Calf Region Moderate depletion  Edema (RD Assessment) None  Hair Reviewed  Eyes Reviewed  Mouth Reviewed  Skin Reviewed  Nails Reviewed   Diet Order:   Diet Order             Diet regular Room service appropriate? Yes; Fluid consistency: Thin  Diet effective now                  EDUCATION NEEDS:  Education needs have been addressed  Skin:  Skin Assessment: Reviewed RN Assessment  Last BM:  PTA/unknown  Height:  Ht Readings from Last 1 Encounters:  01/13/21 5\' 9"  (1.753 m)   Weight:  Wt Readings from Last 1 Encounters:  01/13/21 92 kg   BMI:  Body mass index is 29.95 kg/m.  Estimated Nutritional Needs:  Kcal:  2400-2600 Protein:  110-125 grams Fluid:  >2.2 L  Derrel Nip, RD, LDN (she/her/hers) Registered Dietitian I After-Hours/Weekend Pager # in [phone]

## 2021-01-14 NOTE — Discharge Instructions (Signed)

## 2021-01-14 NOTE — Progress Notes (Signed)
Patient attempting to reposition in bed and use the urinal. O2 had been weaned down to 2L via N/C. Patient became extremely SOB with increased WOB. O2 titrated up to 4L via N/C with very slow recovery. Sats dropped to 77% with activiy on 2L.

## 2021-01-14 NOTE — Progress Notes (Signed)
Patient ID: BURKE TERRY, male   DOB: 1955/07/14, 65 y.o.   MRN: [id number] [facility] PROGRESS NOTE  SCHAWN BYAS OMV:[id number] DOB: April 06, 1956 DOA: 01/12/2021 PCP: Idelle Crouch, MD  HPI/Subjective: Patient admitted 01/12/2021 with shortness of breath and pulmonary embolism.  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  Patient feels better after thrombolysis and thrombectomy yesterday.  Objective: Vitals:   01/14/21 1353 01/14/21 1533  BP:  112/67  Pulse:  (!) 58  Resp:  18  Temp:  99.1 F (37.3 C)  SpO2: 100% 100%    Intake/Output Summary (Last 24 hours) at 01/14/2021 1711 Last data filed at 01/14/2021 1050 Gross per 24 hour  Intake 876.8 ml  Output --  Net 876.8 ml   Filed Weights   01/12/21 0931 01/13/21 1107 01/13/21 1523  Weight: 90.7 kg 93 kg 92 kg    ROS: Review of Systems  Respiratory:  Positive for shortness of breath.   Cardiovascular:  Negative for chest pain.  Gastrointestinal:  Negative for abdominal pain, nausea and vomiting.  Exam: Physical Exam HENT:     Head: Normocephalic.     Mouth/Throat:     Pharynx: No oropharyngeal exudate.  Eyes:     General: Lids are normal.     Conjunctiva/sclera: Conjunctivae normal.  Cardiovascular:     Rate and Rhythm: Normal rate and regular rhythm.     Heart sounds: Normal heart sounds, S1 normal and S2 normal.  Pulmonary:     Breath sounds: Examination of the right-lower field reveals decreased breath sounds. Examination of the left-lower field reveals decreased breath sounds. Decreased breath sounds present. No wheezing, rhonchi or rales.  Abdominal:     Palpations: Abdomen is soft.     Tenderness: There is no abdominal tenderness.  Musculoskeletal:     Right lower leg: No swelling.     Left lower leg: No swelling.  Skin:    General: Skin is warm.     Findings: No rash.  Neurological:     Mental Status: He is alert and oriented to person, place, and time.       Scheduled Meds:  calamine   Topical BID   Chlorhexidine Gluconate Cloth  6 each Topical Daily   feeding supplement  237 mL Oral TID BM   ipratropium-albuterol  3 mL Nebulization Q6H   multivitamin with minerals  1 tablet Oral Daily   rosuvastatin  40 mg Oral Daily   sodium chloride flush  10-40 mL Intracatheter Q12H   Continuous Infusions:  sodium chloride 75 mL/hr at 01/14/21 1313   heparin 1,200 Units/hr (01/13/21 1540)    Assessment/Plan:  Bilateral pulmonary emboli.  Patient is status post thrombolysis and thrombectomy yesterday.  Continue heparin drip today.  Hopefully can switch over to Eliquis tomorrow. Acute kidney injury.  Creatinine went from 0.82 up to 1.39.  Patient had contrast procedure yesterday.  I ordered a fluid bolus and IV fluids.  Recheck creatinine tomorrow. 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  Status post tracheostomy.  Continue to follow-up with oncology as outpatient. Hypokalemia and hypomagnesemia during the hospital course Neutropenia likely secondary to previous chemotherapy Anxiety on Xanax Hyperlipidemia unspecified on Crestor Essential hypertension on amlodipine metoprolol hydrochlorothiazide olmesartan.        Code Status:     Code Status Orders  (From admission, onward)           Start     Ordered   01/12/21  1631  Full code  Continuous        01/12/21 1631           Code Status History     Date Active Date Inactive Code Status Order ID Comments User Context   01/12/2021 1510 01/12/2021 1632 Full Code [id number]  Criss Alvine, DO ED   11/28/2020 1407 12/02/2020 1914 Full Code [id number]  Tyler Pita, MD Inpatient      Advance Directive Documentation    Flowsheet Row Most Recent Value  Type of Advance Directive Healthcare Power of Attorney, Living will  Pre-existing out of facility DNR order (yellow form or pink MOST form) --  "MOST" Form in Place? --      Family Communication: Spoke  with family at the bedside Disposition Plan: Status is: Inpatient  Dispo: The patient is from: Home              Anticipated d/c is to: Home              Patient currently doing better.  We will see what patient's creatinine is tomorrow before making a decision upon disposition.   Difficult to place patient.  No.  Time spent: 28 minutes  [place]

## 2021-01-14 NOTE — Progress Notes (Addendum)
[facility] Daily Progress Note  01/13/21:             1.  Selective injection right subsegmental pulmonary arteries; right  lower lobe pulmonary arteries.             2.  Selective injection left subsegmental pulmonary arteries; left lower lobe pulmonary arteries             3.  Mechanical thrombectomy bilateral lobar pulmonary arteries for removal of pulmonary emboli using the Penumbra CAT 8 thrombectomy catheter.  Subjective: No acute issues overnight.  Patient without complaint this AM.  Notes some improvement in breathing.  Improvement in coughing however is still persisting.  Objective: Vitals:   01/14/21 0053 01/14/21 0732 01/14/21 0848 01/14/21 1117  BP: (!) 99/57 (!) 102/59  (!) 90/53  Pulse: (!) 57 (!) 56  (!) 58  Resp: 16 18  18   Temp: 98.8 F (37.1 C) (!) 97.2 F (36.2 C)  97.9 F (36.6 C)  TempSrc:      SpO2: 94% 96% 97% 99%  Weight:      Height:        Intake/Output Summary (Last 24 hours) at 01/14/2021 1349 Last data filed at 01/14/2021 1050 Gross per 24 hour  Intake 876.8 ml  Output --  Net 876.8 ml   Physical Exam: A&Ox3, NAD Neck: Trach in place.  Surrounding skin is clean and dry and intact. CV: RRR Pulmonary: CTA Bilaterally Abdomen: Soft, Nontender, Nondistended Access site: Clean dry and intact.  No swelling or drainage noted. Vascular: Bilateral lower extremity are warm distally to toes.  Minimal edema.  Motor/sensory is intact.   Laboratory: CBC    Component Value Date/Time   WBC 2.7 (L) 01/14/2021 0700   HGB 9.4 (L) 01/14/2021 0700   HCT 27.6 (L) 01/14/2021 0700   PLT 198 01/14/2021 0700   BMET    Component Value Date/Time   NA 138 01/14/2021 0700   K 4.2 01/14/2021 0700   CL 103 01/14/2021 0700   CO2 27 01/14/2021 0700   GLUCOSE 91 01/14/2021 0700   BUN 22 01/14/2021 0700   CREATININE 1.39 (H) 01/14/2021 0700   CALCIUM 8.5 (L) 01/14/2021 0700   GFRNONAA 57 (L) 01/14/2021 0700   GFRAA 37 (L) 11/13/2019 0957    Assessment/Planning: The patient is a 65 year old male with multiple medical issues who presented to the [facility] Emergency Department with PE s/p thrombectomy / thrombolysis - POD#1  1) Patient notes improvement in his breathing however continues to cough which he describes as "painful".  He continues to experience a productive "blood-tinged" sputum. No acute issues overnight.  Coughing is common after thrombectomy / thrombolysis however would expect the blood-tinged tinged sputum to resolve today into tomorrow. Possible consult to pulmonary if this productive cough persists?  2) Patient is currently being maintained on heparin.  Okay to switch to oral anticoagulation when medically stable.  Preferably Eliquis.  3) Ambulation, out of bed to chair.  4) AM BMP for slight increase in creatinine.  Will leave IV fluids running overnight.  Discussed with Dr. Eber Hong Nannette Zill PA-C 01/14/2021 1:49 PM

## 2021-01-14 NOTE — Evaluation (Signed)
Physical Therapy Evaluation Patient Details Name: Scott Harding MRN: [id number] DOB: [birth year] Today's Date: 01/14/2021  History of Present Illness  65 year old male with medical history significant for hyperlipidemia, hypokalemia, hypertension, CAD status post CABG, squamous cell carcinoma of the left tonsils, malignant neoplasm of the supraglottis, laryngeal mass, ischemic cardiomyopathy, valvular heart disease, who presents emergency to the emergency department for chief concerns of shortness of breath. Imaging revealed bilateral PE, negative DVT. Pt now POD 1 thrombectomy / thrombolysis.   Clinical Impression  Pt received supine in bed, agreeable to therapy. He states he has been getting out of bed to use the restroom by himself while here in the hospital. Pt daughter arrived mid-session with a snack for her dad. Pt performed bed mobility and STS independently, no concerns for LOB. He ambulated 568ft with SUP, one minor anterior LOB that was corrected via stepping strategy due to pushing the door open with too much force. All mobility was completed with Passy Muir valve in place, no supplemental O2 and SpO2 remaining 98% or greater. Further PT is not warranted for pt. PT is rec pt d/c home with PRN assist by family.      Recommendations for follow up therapy are one component of a multi-disciplinary discharge planning process, led by the attending physician.  Recommendations may be updated based on patient status, additional functional criteria and insurance authorization.  Follow Up Recommendations No PT follow up    Equipment Recommendations  None recommended by PT    Recommendations for Other Services       Precautions / Restrictions Precautions Precautions: None Restrictions Weight Bearing Restrictions: No Other Position/Activity Restrictions: Trach collar, 28% FiO2 on 5L O2 - pt remained 98% and greater during mobility without supplemental O2.      Mobility  Bed  Mobility Overal bed mobility: Independent             General bed mobility comments: performed with ease    Transfers Overall transfer level: Independent               General transfer comment: STS with no AD, performed with ease  Ambulation/Gait Ambulation/Gait assistance: Supervision Gait Distance (Feet): 550 Feet Assistive device: None Gait Pattern/deviations: WFL(Within Functional Limits);Step-through pattern Gait velocity: WFL   General Gait Details: No significant gait deficits. One anterior LOB while pushing his door open, pt utilized stepping strategy to correct. PT managed IV pole.  Stairs            Wheelchair Mobility    Modified Rankin (Stroke Patients Only)       Balance Overall balance assessment: Needs assistance   Sitting balance-Leahy Scale: Normal       Standing balance-Leahy Scale: Good Standing balance comment: Good static and dynamic balance. Pt able to turn head as he ambulates without LOB. He did demo one anterior LOB with self-correction via stepping strategy after pushing his room door open with too much force.                             Pertinent Vitals/Pain Pain Assessment: No/denies pain    Home Living Family/patient expects to be discharged to:: Private residence Living Arrangements: Alone Available Help at Discharge: Family;Available PRN/intermittently Type of Home: House Home Access: Stairs to enter Entrance Stairs-Rails: Left Entrance Stairs-Number of Steps: 3 Home Layout: One level Home Equipment: None Additional Comments: Fully independent at baseline, does not use or own any AD. Pt  drives and is retired. Is able to ambulate community distances. Reports no falls in the past 6 months.    Prior Function Level of Independence: Independent               Hand Dominance        Extremity/Trunk Assessment   Upper Extremity Assessment Upper Extremity Assessment: Overall WFL for tasks  assessed    Lower Extremity Assessment Lower Extremity Assessment: Overall WFL for tasks assessed    Cervical / Trunk Assessment Cervical / Trunk Assessment: Normal  Communication   Communication: No difficulties;Tracheostomy;Passy-Muir valve  Cognition Arousal/Alertness: Awake/alert Behavior During Therapy: WFL for tasks assessed/performed Overall Cognitive Status: Within Functional Limits for tasks assessed                                 General Comments: A&Ox4      General Comments General comments (skin integrity, edema, etc.): Tracheostomy collar with O2 available - PT monitored without O2 and pt was able to maintain O2 sats >98% on RA, no supp O2 utilized with mobility.    Exercises     Assessment/Plan    PT Assessment Patent does not need any further PT services  PT Problem List Decreased activity tolerance       PT Treatment Interventions      PT Goals (Current goals can be found in the Care Plan section)  Acute Rehab PT Goals Patient Stated Goal: to go home PT Goal Formulation: With patient Time For Goal Achievement: 01/28/21 Potential to Achieve Goals: Good    Frequency     Barriers to discharge        Co-evaluation               AM-PAC PT "6 Clicks" Mobility  Outcome Measure Help needed turning from your back to your side while in a flat bed without using bedrails?: None Help needed moving from lying on your back to sitting on the side of a flat bed without using bedrails?: None Help needed moving to and from a bed to a chair (including a wheelchair)?: None Help needed standing up from a chair using your arms (e.g., wheelchair or bedside chair)?: None Help needed to walk in hospital room?: A Little Help needed climbing 3-5 steps with a railing? : A Little 6 Click Score: 22    End of Session Equipment Utilized During Treatment: Gait belt Activity Tolerance: Patient tolerated treatment well Patient left: in bed;with call  bell/phone within reach;with family/visitor present Nurse Communication: Mobility status PT Visit Diagnosis: Difficulty in walking, not elsewhere classified (R26.2)    Time: 8270-7867 PT Time Calculation (min) (ACUTE ONLY): 27 min   Charges:   PT Evaluation $PT Eval Moderate Complexity: 1 Mod PT Treatments $Therapeutic Activity: 8-22 mins       Patrina Levering PT, DPT 01/14/21 4:56 PM [phone]   [facility] 01/14/2021, 4:48 PM

## 2021-01-15 ENCOUNTER — Ambulatory Visit: Payer: 59

## 2021-01-15 DIAGNOSIS — D638 Anemia in other chronic diseases classified elsewhere: Secondary | ICD-10-CM

## 2021-01-15 LAB — BASIC METABOLIC PANEL
Anion gap: 9 (ref 5–15)
BUN: 15 mg/dL (ref 8–23)
CO2: 24 mmol/L (ref 22–32)
Calcium: 8.4 mg/dL — ABNORMAL LOW (ref 8.9–10.3)
Chloride: 105 mmol/L (ref 98–111)
Creatinine, Ser: 1.21 mg/dL (ref 0.61–1.24)
GFR, Estimated: >60 mL/min (ref >60)
Glucose, Bld: 95 mg/dL (ref 70–99)
Potassium: 3.7 mmol/L (ref 3.5–5.1)
Sodium: 138 mmol/L (ref 135–145)

## 2021-01-15 LAB — CBC
HCT: 27.7 % — ABNORMAL LOW (ref 39.0–52.0)
Hemoglobin: 9.4 g/dL — ABNORMAL LOW (ref 13.0–17.0)
MCH: 32.9 pg (ref 26.0–34.0)
MCHC: 33.9 g/dL (ref 30.0–36.0)
MCV: 96.9 fL (ref 80.0–100.0)
Platelets: 182 10*3/uL (ref 150–400)
RBC: 2.86 MIL/uL — ABNORMAL LOW (ref 4.22–5.81)
RDW: 13.1 % (ref 11.5–15.5)
WBC: 3.5 10*3/uL — ABNORMAL LOW (ref 4.0–10.5)
nRBC: 0 % (ref 0.0–0.2)

## 2021-01-15 LAB — HEPARIN LEVEL (UNFRACTIONATED): Heparin Unfractionated: 0.34 IU/mL (ref 0.30–0.70)

## 2021-01-15 MED ORDER — APIXABAN 5 MG PO TABS
10.0000 mg | ORAL_TABLET | Freq: Two times a day (BID) | ORAL | Status: DC
  Administered 2021-01-15: 10 mg via ORAL
  Filled 2021-01-15: qty 2

## 2021-01-15 MED ORDER — FLUTICASONE PROPIONATE 50 MCG/ACT NA SUSP: 1.0000 | Freq: Every day | NASAL | 0 refills | Status: DC

## 2021-01-15 MED ORDER — IPRATROPIUM-ALBUTEROL 0.5-2.5 (3) MG/3ML IN SOLN: 3.0000 mL | Freq: Four times a day (QID) | RESPIRATORY_TRACT | 0 refills | Status: AC

## 2021-01-15 MED ORDER — ENSURE ENLIVE PO LIQD: 237.0000 mL | Freq: Three times a day (TID) | ORAL | 0 refills | Status: DC

## 2021-01-15 MED ORDER — HEPARIN SOD (PORK) LOCK FLUSH 100 UNIT/ML IV SOLN
500.0000 [IU] | Freq: Once | INTRAVENOUS | Status: AC
  Administered 2021-01-15: 500 [IU] via INTRAVENOUS
  Filled 2021-01-15: qty 5

## 2021-01-15 MED ORDER — APIXABAN 5 MG PO TABS: ORAL_TABLET | ORAL | 0 refills | Status: DC

## 2021-01-15 NOTE — Progress Notes (Signed)
Discharge instructions (including medications) discussed with and copy provided to patient/caregiver.  Port was de-accessed and Eliquis coupon was given.

## 2021-01-15 NOTE — Progress Notes (Signed)
Per Thedore Mins with [employer] the nebulizer was delivered to patient.   [place], [place]

## 2021-01-15 NOTE — Discharge Summary (Signed)
[facility] NAME: Scott Harding    MR#:  [id number]  DATE OF BIRTH:  27-Mar-1956  DATE OF ADMISSION:  01/12/2021 ADMITTING PHYSICIAN: Amy N Cox, DO  DATE OF DISCHARGE: 01/15/2021  2:58 PM  PRIMARY CARE PHYSICIAN: Idelle Crouch, MD    ADMISSION DIAGNOSIS:  Pulmonary embolism ([place]) [I26.99] Acute pulmonary embolism, unspecified pulmonary embolism type, unspecified whether acute cor pulmonale present ([place]) [I26.99]  DISCHARGE DIAGNOSIS:  Principal Problem:   Pulmonary embolism (HCC) Active Problems:   Essential hypertension   CAD (coronary artery disease) of bypass graft   Hyperlipidemia   Ischemic cardiomyopathy   Primary squamous cell carcinoma of tonsil (HCC)   Laryngeal mass   Malignant neoplasm of supraglottis (HCC)   Hypokalemia   AKI (acute kidney injury) ([place])   Neutropenia ([place])   SECONDARY DIAGNOSIS:   Past Medical History:  Diagnosis Date  . Cancer ([place])   . Coronary artery disease   . Hypertension     HOSPITAL COURSE:   1.  Bilateral pulmonary emboli.  Patient initially started on heparin drip.  Dr. Delana Meyer vascular surgery took to the procedure lab for thrombolysis and thrombectomy on 01/13/2021.  The patient was switched over to Eliquis on 01/15/2021.  30-day free card given.  Benefits and risks of Eliquis explained to patient and family.  Ultrasound of the upper and lower extremities were negative for DVT during the hospital course.  Patient initially came in with coughing up blood.  Now only coughing blood tinged sputum upon discharge. 2.  Acute kidney injury.  Creatinine went from 0.82 up to 1.39.  Patient was started on IV fluids and creatinine improved to 1.21.  Recommend rechecking a BMP as outpatient. 3.  Squamous cell carcinoma of the left tonsil and laryngeal mass.  Patient is status post tracheostomy and trach collar.  Follow-up with oncology as outpatient. 4.  Hypokalemia and hypomagnesemia replaced  during the hospital course. 5.  Neutropenia secondary to previous chemotherapy 6.  Anxiety on Xanax 7.  Hyperlipidemia unspecified on Crestor 8.  Essential hypertension holding antihypertensive medications.  Blood pressure 112/66 upon disposition 9.  Anemia likely of chronic disease.  Last hemoglobin 9.4.  Follow-up hemoglobin as outpatient. 10.  Bronchospasm with coughing through the trach.  Prescribed nebulizer and nebulizer medications.  Also prescribed Flonase nasal spray.  Patient having a lot of head congestion also.   DISCHARGE CONDITIONS:   Satisfactory  CONSULTS OBTAINED:  Treatment Team:  Katha Cabal, MD  DRUG ALLERGIES:  No Known Allergies  DISCHARGE MEDICATIONS:   Allergies as of 01/15/2021   No Known Allergies      Medication List     STOP taking these medications    amLODipine 10 MG tablet Commonly known as: NORVASC   aspirin 81 MG EC tablet   doxycycline 100 MG tablet Commonly known as: VIBRA-TABS   lidocaine-prilocaine cream Commonly known as: EMLA   metoprolol succinate 25 MG 24 hr tablet Commonly known as: TOPROL-XL   olmesartan-hydrochlorothiazide 40-12.5 MG tablet Commonly known as: BENICAR HCT       TAKE these medications    ALPRAZolam 0.5 MG tablet Commonly known as: XANAX Take 1 tablet (0.5 mg total) by mouth daily as needed for anxiety (Take 1 hour prior to radiation).   apixaban 5 MG Tabs tablet Commonly known as: ELIQUIS 2 tabs (10mg ) po twice a day for 7 days then one tab (5mg ) po twice a day afterwards   ascorbic  acid 500 MG tablet Commonly known as: VITAMIN C Take by mouth.   cyanocobalamin 1000 MCG tablet Take by mouth.   Denta 5000 Plus 1.1 % Crea dental cream Generic drug: sodium fluoride BRUSH WITH PASTE 2 3 TIMES PER DAY FOR AT LEAST 1 MINUTE   feeding supplement Liqd Take 237 mLs by mouth 3 (three) times daily between meals.   fluticasone 50 MCG/ACT nasal spray Commonly known as: Flonase Place 1  spray into both nostrils daily.   ipratropium-albuterol 0.5-2.5 (3) MG/3ML Soln Commonly known as: DUONEB Take 3 mLs by nebulization every 6 (six) hours.   ondansetron 8 MG tablet Commonly known as: ZOFRAN Take 1 tablet (8 mg total) by mouth every 8 (eight) hours as needed for nausea or vomiting.   prochlorperazine 10 MG tablet Commonly known as: COMPAZINE Take 1 tablet (10 mg total) by mouth every 6 (six) hours as needed for nausea or vomiting.   rosuvastatin 40 MG tablet Commonly known as: CRESTOR Take 40 mg by mouth daily.               Durable Medical Equipment  (From admission, onward)           Start     Ordered   01/15/21 0939  For home use only DME Nebulizer/meds  Once       Question Answer Comment  Patient needs a nebulizer to treat with the following condition Bronchospasm   Length of Need 6 Months      01/15/21 0939             DISCHARGE INSTRUCTIONS:   Follow-up PMD 5 days Follow-up with Dr. Grayland Ormond oncology  If you experience worsening of your admission symptoms, develop shortness of breath, life threatening emergency, suicidal or homicidal thoughts you must seek medical attention immediately by calling 911 or calling your MD immediately  if symptoms less severe.  You Must read complete instructions/literature along with all the possible adverse reactions/side effects for all the Medicines you take and that have been prescribed to you. Take any new Medicines after you have completely understood and accept all the possible adverse reactions/side effects.   Please note  You were cared for by a hospitalist during your hospital stay. If you have any questions about your discharge medications or the care you received while you were in the hospital after you are discharged, you can call the unit and asked to speak with the hospitalist on call if the hospitalist that took care of you is not available. Once you are discharged, your primary care  physician will handle any further medical issues. Please note that NO REFILLS for any discharge medications will be authorized once you are discharged, as it is imperative that you return to your primary care physician (or establish a relationship with a primary care physician if you do not have one) for your aftercare needs so that they can reassess your need for medications and monitor your lab values.    Today   CHIEF COMPLAINT:   Chief Complaint  Patient presents with  . Shortness of Breath    HISTORY OF PRESENT ILLNESS:  Scott Harding  is a 65 y.o. male came in with shortness of breath and found to have bilateral pulmonary emboli.   VITAL SIGNS:  Blood pressure 112/66, pulse 70, temperature 98.7 F (37.1 C), temperature source Oral, resp. rate 18, height 5\' 9"  (1.753 m), weight 92 kg, SpO2 99 %.  I/O:   Intake/Output Summary (Last 24 hours)  at 01/15/2021 1718 Last data filed at 01/15/2021 0950 Gross per 24 hour  Intake 1913.43 ml  Output 300 ml  Net 1613.43 ml    PHYSICAL EXAMINATION:  GENERAL:  65 y.o.-year-old patient lying in the bed with no acute distress.  EYES: Pupils equal, round, reactive to light and accommodation. No scleral icterus. Extraocular muscles intact.  HEENT: Head atraumatic, normocephalic. Oropharynx and nasopharynx clear.  LUNGS: Upper airway transmitted breath sounds bilaterally with rhonchi.. No use of accessory muscles of respiration.  CARDIOVASCULAR: S1, S2 normal. No murmurs, rubs, or gallops.  ABDOMEN: Soft, non-tender, non-distended.  EXTREMITIES: No pedal edema.  NEUROLOGIC: Cranial nerves II through XII are intact. PSYCHIATRIC: The patient is alert and answers all questions appropriately.  SKIN: No obvious rash, lesion, or ulcer.   DATA REVIEW:   CBC Recent Labs  Lab 01/15/21 0848  WBC 3.5*  HGB 9.4*  HCT 27.7*  PLT 182    Chemistries  Recent Labs  Lab 01/13/21 0726 01/14/21 0700 01/15/21 0848  NA 139   < > 138  K 4.1   < >  3.7  CL 101   < > 105  CO2 28   < > 24  GLUCOSE 128*   < > 95  BUN 15   < > 15  CREATININE 0.82   < > 1.21  CALCIUM 9.0   < > 8.4*  MG 2.0  --   --    < > = values in this interval not displayed.     Microbiology Results  Results for orders placed or performed during the hospital encounter of 01/12/21  Resp Panel by RT-PCR (Flu A&B, Covid) Nasopharyngeal Swab     Status: None   Collection Time: 01/12/21 12:16 PM   Specimen: Nasopharyngeal Swab; Nasopharyngeal(NP) swabs in vial transport medium  Result Value Ref Range Status   SARS Coronavirus 2 by RT PCR NEGATIVE NEGATIVE Final    Comment: (NOTE) SARS-CoV-2 target nucleic acids are NOT DETECTED.  The SARS-CoV-2 RNA is generally detectable in upper respiratory specimens during the acute phase of infection. The lowest concentration of SARS-CoV-2 viral copies this assay can detect is 138 copies/mL. A negative result does not preclude SARS-Cov-2 infection and should not be used as the sole basis for treatment or other patient management decisions. A negative result may occur with  improper specimen collection/handling, submission of specimen other than nasopharyngeal swab, presence of viral mutation(s) within the areas targeted by this assay, and inadequate number of viral copies(<138 copies/mL). A negative result must be combined with clinical observations, patient history, and epidemiological information. The expected result is Negative.  Fact Sheet for Patients:  EntrepreneurPulse.com.au  Fact Sheet for Healthcare Providers:  IncredibleEmployment.be  This test is no t yet approved or cleared by the [place] FDA and  has been authorized for detection and/or diagnosis of SARS-CoV-2 by FDA under an Emergency Use Authorization (EUA). This EUA will remain  in effect (meaning this test can be used) for the duration of the COVID-19 declaration under Section 564(b)(1) of the Act,  21 U.S.C.section 360bbb-3(b)(1), unless the authorization is terminated  or revoked sooner.       Influenza A by PCR NEGATIVE NEGATIVE Final   Influenza B by PCR NEGATIVE NEGATIVE Final    Comment: (NOTE) The Xpert Xpress SARS-CoV-2/FLU/RSV plus assay is intended as an aid in the diagnosis of influenza from Nasopharyngeal swab specimens and should not be used as a sole basis for treatment. Nasal washings and aspirates are  unacceptable for Xpert Xpress SARS-CoV-2/FLU/RSV testing.  Fact Sheet for Patients: EntrepreneurPulse.com.au  Fact Sheet for Healthcare Providers: IncredibleEmployment.be  This test is not yet approved or cleared by the [place] FDA and has been authorized for detection and/or diagnosis of SARS-CoV-2 by FDA under an Emergency Use Authorization (EUA). This EUA will remain in effect (meaning this test can be used) for the duration of the COVID-19 declaration under Section 564(b)(1) of the Act, 21 U.S.C. section 360bbb-3(b)(1), unless the authorization is terminated or revoked.  Performed at [facility], [address], [place], [place] [address]        Management plans discussed with the patient, family and they are in agreement.  CODE STATUS:     Code Status Orders  (From admission, onward)           Start     Ordered   01/12/21 1631  Full code  Continuous        01/12/21 1631           Code Status History     Date Active Date Inactive Code Status Order ID Comments User Context   01/12/2021 1510 01/12/2021 1632 Full Code [id number]  Criss Alvine, DO ED   11/28/2020 1407 12/02/2020 1914 Full Code [id number]  Tyler Pita, MD Inpatient      Advance Directive Documentation    Flowsheet Row Most Recent Value  Type of Advance Directive Healthcare Power of Attorney, Living will  Pre-existing out of facility DNR order (yellow form or pink MOST form) --  "MOST" Form in Place? --        TOTAL TIME TAKING CARE OF THIS PATIENT: 34 minutes.    Loletha Grayer M.D on 01/15/2021 at 5:18 PM  [facility]  CC: Primary care physician; Idelle Crouch, MD

## 2021-01-15 NOTE — Evaluation (Signed)
Occupational Therapy Evaluation Patient Details Name: Scott Harding MRN: [id number] DOB: 1955-11-13 Today's Date: 01/15/2021   History of Present Illness 65 year old male with medical history significant for hyperlipidemia, hypokalemia, hypertension, CAD status post CABG, squamous cell carcinoma of the left tonsils, malignant neoplasm of the supraglottis, laryngeal mass, ischemic cardiomyopathy, valvular heart disease, who presents emergency to the emergency department for chief concerns of shortness of breath. Imaging revealed bilateral PE, negative DVT. Pt now POD 1 thrombectomy / thrombolysis.   Clinical Impression   Pt seen for OT evaluation this date in setting of acute hospitalization 2/2 PE. Pt reports being INDEP at baseline and on room air. Pt presents this date with some decreased tolerance, but appears to be nearing his functional baseline. He requires no assistance for mobility and strength is functional for safe completion of all ADLs. Pt family member present and extended time spent on below-listed education. Pt able to transfer with no assist and no AD and able to perform seated and standing ADLs w/o AE or assist. No further needs perceived at this time. Will complete OT order.     Recommendations for follow up therapy are one component of a multi-disciplinary discharge planning process, led by the attending physician.  Recommendations may be updated based on patient status, additional functional criteria and insurance authorization.   Follow Up Recommendations  No OT follow up    Equipment Recommendations  None recommended by OT    Recommendations for Other Services       Precautions / Restrictions Precautions Precautions: None Restrictions Weight Bearing Restrictions: No Other Position/Activity Restrictions: Trach collar, 28% FiO2 on 5L O2 - pt remained 98% and greater during mobility without supplemental O2.      Mobility Bed Mobility Overal bed mobility:  Independent             General bed mobility comments: HOB only slightly elevated ~10-20 degrees    Transfers Overall transfer level: Independent Equipment used: None             General transfer comment: steady, no AD    Balance Overall balance assessment: Modified Independent   Sitting balance-Leahy Scale: Normal       Standing balance-Leahy Scale: Good Standing balance comment: G static with no AD                           ADL either performed or assessed with clinical judgement   ADL Overall ADL's : Modified independent;At baseline                                             Vision Baseline Vision/History: 1 Wears glasses Ability to See in Adequate Light: 0 Adequate Patient Visual Report: No change from baseline       Perception     Praxis      Pertinent Vitals/Pain Pain Assessment: No/denies pain     Hand Dominance     Extremity/Trunk Assessment Upper Extremity Assessment Upper Extremity Assessment: Overall WFL for tasks assessed   Lower Extremity Assessment Lower Extremity Assessment: Overall WFL for tasks assessed   Cervical / Trunk Assessment Cervical / Trunk Assessment: Normal   Communication Communication Communication: No difficulties;Tracheostomy;Passy-Muir valve   Cognition Arousal/Alertness: Awake/alert Behavior During Therapy: WFL for tasks assessed/performed Overall Cognitive Status: Within Functional Limits for tasks assessed  General Comments: A&Ox4   General Comments       Exercises Other Exercises Other Exercises: OT ed re: importance of OOB activity, sitting upright, diaprhagmatic breathing, clearing secretions for prevention of opportunistic infection in setting of dereased immune system 2/2 cancer treatments. Pt and family member present with good undersatnding and ask appropriate questions.   Shoulder Instructions      Home Living  Family/patient expects to be discharged to:: Private residence Living Arrangements: Alone Available Help at Discharge: Family;Available PRN/intermittently Type of Home: House Home Access: Stairs to enter [employer] of Steps: 3 Entrance Stairs-Rails: Left Home Layout: One level     Bathroom Shower/Tub: Teacher, early years/pre: Standard Bathroom Accessibility: No   Home Equipment: None   Additional Comments: Fully independent at baseline, does not use or own any AD. Pt drives and is retired. Is able to ambulate community distances. Reports no falls in the past 6 months.      Prior Functioning/Environment Level of Independence: Independent                 OT Problem List: Decreased activity tolerance;Cardiopulmonary status limiting activity      OT Treatment/Interventions:      OT Goals(Current goals can be found in the care plan section) Acute Rehab OT Goals Patient Stated Goal: to go home OT Goal Formulation: All assessment and education complete, DC therapy  OT Frequency:     Barriers to D/C:            Co-evaluation              AM-PAC OT "6 Clicks" Daily Activity     Outcome Measure Help from another person eating meals?: None Help from another person taking care of personal grooming?: None Help from another person toileting, which includes using toliet, bedpan, or urinal?: None Help from another person bathing (including washing, rinsing, drying)?: None Help from another person to put on and taking off regular upper body clothing?: None Help from another person to put on and taking off regular lower body clothing?: None 6 Click Score: 24   End of Session Equipment Utilized During Treatment: Oxygen;Other (comment) (trach collar primarily for humidification) Nurse Communication: Mobility status  Activity Tolerance: Patient tolerated treatment well Patient left: in bed;with call bell/phone within reach;with family/visitor  present  OT Visit Diagnosis: Other abnormalities of gait and mobility (R26.89)                Time: 9735-3299 OT Time Calculation (min): 14 min Charges:  OT General Charges $OT Visit: 1 Visit OT Evaluation $OT Eval Low Complexity: [place], MS, OTR/L ascom [phone] 01/15/21, 1:28 PM

## 2021-01-16 ENCOUNTER — Ambulatory Visit: Payer: 59

## 2021-01-16 ENCOUNTER — Other Ambulatory Visit: Payer: Self-pay | Admitting: Oncology

## 2021-01-19 ENCOUNTER — Ambulatory Visit: Payer: 59

## 2021-01-19 NOTE — Progress Notes (Signed)
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  INTERVAL HISTORY: Patient returns to clinic today for further evaluation and consideration of cycle 2, day 1 of Keytruda, carboplatin, and Taxol.  He was admitted to the hospital last week with pulmonary embolism, but is fully recovered and feels nearly back to his baseline.  He does not complain of pain today.  He has no neurologic complaints.  He denies any recent fevers or illnesses. He has no chest pain, shortness of breath, cough, or hemoptysis.  He denies any nausea, vomiting, constipation, or diarrhea.  He has no urinary complaints.  Patient offers no specific complaints today.  REVIEW OF SYSTEMS:   Review of Systems  Constitutional: Negative.  Negative for fever, malaise/fatigue and weight loss.  Respiratory: Negative.  Negative for cough, hemoptysis and shortness of breath.   Cardiovascular: Negative.  Negative for chest pain and leg swelling.  Gastrointestinal: Negative.  Negative for abdominal pain.  Genitourinary: Negative.  Negative for dysuria.  Musculoskeletal: Negative.  Negative for neck pain.  Skin: Negative.  Negative for rash.  Neurological: Negative.  Negative for dizziness, focal weakness, weakness and headaches.  Psychiatric/Behavioral: Negative.  The patient is not nervous/anxious.    As per HPI. Otherwise, a complete review of systems is negative.  PAST MEDICAL HISTORY: Past Medical History:  Diagnosis Date   Cancer [facility])     Coronary artery disease    Hypertension     PAST SURGICAL HISTORY: Past Surgical History:  Procedure Laterality Date   CORONARY ARTERY BYPASS GRAFT  2010   Quadruple   DIRECT LARYNGOSCOPY  11/28/2020   Procedure: DIRECT LARYNGOSCOPY WITH BIOPSY;  Surgeon: Beverly Gust, MD;  Location: [facility] ORS;  Service: ENT;;   IR IMAGING GUIDED PORT INSERTION  01/01/2021   PULMONARY THROMBECTOMY Bilateral 01/13/2021   Procedure: PULMONARY THROMBECTOMY;  Surgeon: Katha Cabal, MD;  Location: [facility];  Service: Cardiovascular;  Laterality: Bilateral;   TRACHEOSTOMY TUBE PLACEMENT N/A 11/28/2020   Procedure: AWAKE TRACHEOSTOMY;  Surgeon: Beverly Gust, MD;  Location: [facility] OR[facility];  Service: ENT;  Laterality: N/A;    FAMILY HISTORY: Family History  Problem Relation Age of Onset   Arthritis Mother    Heart attack Father    Brain cancer Sister     ADVANCED DIRECTIVES (Y/N):  N  HEALTH MAINTENANCE: Social History   Tobacco Use   Smoking status: Former    Types: Cigarettes    Quit date: 10/17/2008    Years since quitting: 12.2   Smokeless tobacco: Never  Vaping Use   Vaping Use: Never used  Substance Use Topics   Alcohol use: Yes    Comment: occasionally   Drug use: Never     Colonoscopy:  PAP:  Bone density:  Lipid panel:  No Known Allergies  Current Outpatient Medications  Medication Sig Dispense Refill   ALPRAZolam (XANAX) 0.5 MG tablet Take 1 tablet (0.5 mg total) by mouth daily as needed for anxiety (Take 1 hour prior to radiation). 30 tablet 1   ascorbic acid (VITAMIN C) 500 MG tablet Take by  mouth.     cyanocobalamin 1000 MCG tablet Take by mouth.     DENTA 5000 PLUS 1.1 % CREA dental cream BRUSH WITH PASTE 2 3 TIMES PER DAY FOR AT LEAST 1 MINUTE     feeding supplement (ENSURE ENLIVE / ENSURE PLUS) LIQD Take 237 mLs by mouth 3 (three) times daily between meals. 21330 mL 0   fluticasone (FLONASE) 50 MCG/ACT nasal spray Place 1 spray into both nostrils daily.  15.8 mL 0   ipratropium-albuterol (DUONEB) 0.5-2.5 (3) MG/3ML SOLN Take 3 mLs by nebulization every 6 (six) hours. 360 mL 0   ondansetron (ZOFRAN) 8 MG tablet Take 1 tablet (8 mg total) by mouth every 8 (eight) hours as needed for nausea or vomiting. 60 tablet 2   prochlorperazine (COMPAZINE) 10 MG tablet Take 1 tablet (10 mg total) by mouth every 6 (six) hours as needed for nausea or vomiting. 60 tablet 2   rosuvastatin (CRESTOR) 40 MG tablet Take 40 mg by mouth daily.     apixaban (ELIQUIS) 5 MG TABS tablet Take 1 tablet (5 mg total) by mouth 2 (two) times daily. 180 tablet 1   hydrALAZINE (APRESOLINE) 50 MG tablet Take 50 mg by mouth 2 (two) times daily.     No current facility-administered medications for this visit.   Facility-Administered Medications Ordered in Other Visits  Medication Dose Route Frequency Provider Last Rate Last Admin   heparin lock flush 100 unit/mL  500 Units Intravenous Once Lloyd Huger, MD       sodium chloride flush (NS) 0.9 % injection 10 mL  10 mL Intravenous PRN Lloyd Huger, MD   10 mL at 01/06/21 1305    OBJECTIVE: Vitals:   01/20/21 0949  BP: 114/72  Pulse: 67  Resp: 16  Temp: (!) 96.8 F (36 C)     Body mass index is 29.36 kg/m.    ECOG FS:0 - Asymptomatic  General: Well-developed, well-nourished, no acute distress. Eyes: Pink conjunctiva, anicteric sclera. HEENT: Normocephalic, moist mucous membranes.  Trach in place. Lungs: No audible wheezing or coughing. Heart: Regular rate and rhythm. Abdomen: Soft, nontender, no obvious distention. Musculoskeletal: No edema, cyanosis, or clubbing. Neuro: Alert, answering all questions appropriately. Cranial nerves grossly intact. Skin: No rashes or petechiae noted. Psych: Normal affect.   LAB RESULTS:  Lab Results  Component Value Date   NA 138 01/20/2021   K 3.5 01/20/2021   CL 106 01/20/2021   CO2 24 01/20/2021   GLUCOSE 99 01/20/2021   BUN 14 01/20/2021   CREATININE 1.29 (H)  01/20/2021   CALCIUM 8.7 (L) 01/20/2021   PROT 6.9 01/20/2021   ALBUMIN 3.8 01/20/2021   AST 18 01/20/2021   ALT 23 01/20/2021   ALKPHOS 33 (L) 01/20/2021   BILITOT 0.6 01/20/2021   GFRNONAA >60 01/20/2021   GFRAA 37 (L) 11/13/2019    Lab Results  Component Value Date   WBC 3.2 (L) 01/20/2021   NEUTROABS 1.8 01/20/2021   HGB 9.7 (L) 01/20/2021   HCT 30.9 (L) 01/20/2021   MCV 100.7 (H) 01/20/2021   PLT 254 01/20/2021     STUDIES: DG Chest 2 View  Result Date: 01/12/2021 CLINICAL DATA:  Shortness of breath. EXAM: CHEST - 2 VIEW COMPARISON:  Chest radiograph 11/28/2020.  PET-CT 12/16/2020. FINDINGS: Tracheostomy tube remains in place overlying the airway. A right jugular Port-A-Cath terminates over the lower SVC. Sequelae of CABG are again identified. The cardiomediastinal silhouette is within normal limits. There is mild chronic coarsening  of the interstitial markings. No acute airspace consolidation, edema, pleural effusion, pneumothorax is identified. Bilateral lung nodules were more fully evaluated on the prior PET-CT. No acute osseous abnormality is seen. IMPRESSION: No active cardiopulmonary disease. Electronically Signed   By: Logan Bores M.D.   On: 01/12/2021 10:02   CT Soft Tissue Neck Wo Contrast  Result Date: 01/12/2021 CLINICAL DATA:  History of supraglottic squamous cell carcinoma EXAM: CT NECK WITHOUT CONTRAST TECHNIQUE: Multidetector CT imaging of the neck was performed following the standard protocol without intravenous contrast. COMPARISON:  CT neck 11/27/2020 FINDINGS: Evaluation is degraded in the absence of intravenous contrast. Pharynx and larynx: The nasal cavity and nasopharynx are unremarkable. The oral cavity and oropharynx appears stable, without evidence of recurrent oropharyngeal disease. There is bulky thickening of the epiglottis, supraglottic laryngeal mucosa, larynx, and hypopharyngeal mucosa. There is involvement of the paraglottic fat bilaterally with  extension to the anterior commissure. There suspected infraglottic extension as before. There is complete effacement of the airway at the level of the larynx, increased since the prior study (2-72). The laryngeal cartilage is similar in appearance, without definite evidence of invasion. There is no evidence of extra laryngeal extension. Salivary glands: The parotid and submandibular glands are unremarkable. Thyroid: Unremarkable. Lymph nodes: A partially calcified left level II a lymph node measuring up to 1.6 cm AP x 1.7 cm TV by 2.1 cm cc appears slightly increased in size in the coronal plane. The apparent slight decrease in size in the axial plane may be due to differences in slice plane acquisition. The 1.1 cm by 1.2 cm by 1.5 cm right level IIa lymph node is minimally decreased in size in the axial plane. A 1.0 cm AP x 1.5 cm TV by 1.3 cm cc right level V lymph node appears minimally decreased in size (2-73, 6-75). The previously described right level III lymph node is not well evaluated in the absence of intravenous contrast but appears grossly similar. The previously described asymmetric right supraclavicular lymph node has decreased in size from 0.6 cm in short axis 2 approximately 0.3 cm (2-89). There are no new or enlarging cervical chain lymph nodes. Vascular: There is calcified atherosclerotic plaque of the carotid bulbs bilaterally, suboptimally evaluated in the absence of intravenous contrast. Limited intracranial: The imaged portions of the intracranial compartment are unremarkable. Visualized orbits: The imaged globes and orbits are unremarkable. Mastoids and visualized paranasal sinuses: There is a right maxillary sinus mucous retention cyst, unchanged. The imaged paranasal sinuses are otherwise clear. The mastoid air cells are clear. Skeleton: There is multilevel degenerative change of the cervical spine, most advanced at C5-C6. There is no acute osseous abnormality or aggressive osseous lesion.  Upper chest: A tracheostomy tube is in place terminating in the midthoracic trachea. Median sternotomy wires are noted. There is a right chest wall port in place. The lungs are assessed on the separately dictated CTA chest. Other: None. IMPRESSION: 1. Evaluation is degraded in the absence of intravenous contrast. 2. Interval increase in bulk of the laryngeal/hypopharyngeal malignancy as detailed above now with complete effacement of the laryngeal airway. 3. No definite evidence of the laryngeal cartilages or extralaryngeal extension. 4. Slight interval decrease in size of some enlarged right cervical chain lymph nodes as above. No new or progressive cervical lymphadenopathy. Electronically Signed   By: Valetta Mole M.D.   On: 01/12/2021 14:26   CT Angio Chest PE W and/or Wo Contrast  Result Date: 01/12/2021 CLINICAL DATA:  Shortness of breath and  chest pain. EXAM: CT ANGIOGRAPHY CHEST WITH CONTRAST TECHNIQUE: Multidetector CT imaging of the chest was performed using the standard protocol during bolus administration of intravenous contrast. Multiplanar CT image reconstructions and MIPs were obtained to evaluate the vascular anatomy. CONTRAST:  64mL OMNIPAQUE IOHEXOL 350 MG/ML SOLN COMPARISON:  PET 12/16/2020. FINDINGS: Cardiovascular: Image quality is degraded by respiratory motion. There are filling defects in the pulmonary arteries bilaterally, with the most proximal clot seen at the lobar level on the right. RV/LV ratio is 1.0. Right IJ Port-A-Cath terminates in the right atrium. Heart is enlarged. No pericardial effusion. Mediastinum/Nodes: No pathologically enlarged mediastinal, hilar or axillary lymph nodes. Esophagus is grossly unremarkable. Lungs/Pleura: Bilateral pulmonary nodules measure slightly larger. Index nodule in the subpleural right middle lobe measures 1.7 cm (6/46), previously 1.5 cm. Second index nodule in the right lower lobe measures 2.2 cm (6/63), previously 1.8 cm. No pleural fluid.  Tracheostomy in place. Upper Abdomen: Visualized portions of the liver, gallbladder, adrenal glands, spleen, pancreas, stomach and bowel are grossly unremarkable. 1.4 cm juxtadiaphragmatic lymph node (4/66), similar to 12/16/2020. Musculoskeletal: Degenerative changes in the spine. No worrisome lytic or sclerotic lesions. Review of the MIP images confirms the above findings. IMPRESSION: 1. Bilateral pulmonary emboli, with the most proximal clot seen at the lobar level on the right. Positive for acute PE with CT evidence of right heart strain (RV/LV Ratio = 1.0) consistent with at least submassive (intermediate risk) PE. The presence of right heart strain has been associated with an increased risk of morbidity and mortality. Please refer to the "PE Focused" order set in EPIC. Critical Value/emergent results were called by telephone at the time of interpretation on 01/12/2021 at 2:32 pm to provider [facility] , who verbally acknowledged these results. 2. Slight progression of pulmonary metastases. Electronically Signed   By: Lorin Picket M.D.   On: 01/12/2021 14:36   PERIPHERAL VASCULAR CATHETERIZATION  Result Date: 01/13/2021 See surgical note for result.  US Venous Img Lower Bilateral (DVT)  Result Date: 01/12/2021 CLINICAL DATA:  65 year old male with a history pulmonary embolism EXAM: BILATERAL LOWER EXTREMITY VENOUS DOPPLER ULTRASOUND TECHNIQUE: Gray-scale sonography with graded compression, as well as color Doppler and duplex ultrasound were performed to evaluate the lower extremity deep venous systems from the level of the common femoral vein and including the common femoral, femoral, profunda femoral, popliteal and calf veins including the posterior tibial, peroneal and gastrocnemius veins when visible. The superficial great saphenous vein was also interrogated. Spectral Doppler was utilized to evaluate flow at rest and with distal augmentation maneuvers in the common femoral, femoral and  popliteal veins. COMPARISON:  None. FINDINGS: RIGHT LOWER EXTREMITY Common Femoral Vein: No evidence of thrombus. Normal compressibility, respiratory phasicity and response to augmentation. Saphenofemoral Junction: No evidence of thrombus. Normal compressibility and flow on color Doppler imaging. Profunda Femoral Vein: No evidence of thrombus. Normal compressibility and flow on color Doppler imaging. Femoral Vein: No evidence of thrombus. Normal compressibility, respiratory phasicity and response to augmentation. Popliteal Vein: No evidence of thrombus. Normal compressibility, respiratory phasicity and response to augmentation. Calf Veins: No evidence of thrombus. Normal compressibility and flow on color Doppler imaging. Superficial Great Saphenous Vein: No evidence of thrombus. Normal compressibility and flow on color Doppler imaging. Other Findings:  None. LEFT LOWER EXTREMITY Common Femoral Vein: No evidence of thrombus. Normal compressibility, respiratory phasicity and response to augmentation. Saphenofemoral Junction: No evidence of thrombus. Normal compressibility and flow on color Doppler imaging. Profunda Femoral Vein: No evidence of  thrombus. Normal compressibility and flow on color Doppler imaging. Femoral Vein: No evidence of thrombus. Normal compressibility, respiratory phasicity and response to augmentation. Popliteal Vein: No evidence of thrombus. Normal compressibility, respiratory phasicity and response to augmentation. Calf Veins: No evidence of thrombus. Normal compressibility and flow on color Doppler imaging. Superficial Great Saphenous Vein: No evidence of thrombus. Normal compressibility and flow on color Doppler imaging. Other Findings:  None. IMPRESSION: Sonographic survey of the bilateral lower extremities negative for DVT Electronically Signed   By: Corrie Mckusick D.O.   On: 01/12/2021 16:32   US Venous Img Upper Bilat (DVT)  Result Date: 01/12/2021 CLINICAL DATA:  Pulmonary embolus,  metastatic head and neck cancer EXAM: BILATERAL UPPER EXTREMITY VENOUS DOPPLER ULTRASOUND TECHNIQUE: Gray-scale sonography with graded compression, as well as color Doppler and duplex ultrasound were performed to evaluate the bilateral upper extremity deep venous systems from the level of the subclavian vein and including the jugular, axillary, basilic, radial, ulnar and upper cephalic vein. Spectral Doppler was utilized to evaluate flow at rest and with distal augmentation maneuvers. COMPARISON:  None. FINDINGS: RIGHT UPPER EXTREMITY Internal Jugular Vein: No evidence of thrombus. Normal compressibility, respiratory phasicity and response to augmentation. Subclavian Vein: Not well visualized secondary to overlying bandage. Axillary Vein: No evidence of thrombus. Normal compressibility, respiratory phasicity and response to augmentation. Cephalic Vein: No evidence of thrombus. Normal compressibility, respiratory phasicity and response to augmentation. Basilic Vein: No evidence of thrombus. Normal compressibility, respiratory phasicity and response to augmentation. Brachial Veins: No evidence of thrombus. Normal compressibility, respiratory phasicity and response to augmentation. Radial Veins: No evidence of thrombus. Normal compressibility, respiratory phasicity and response to augmentation. Ulnar Veins: No evidence of thrombus. Normal compressibility, respiratory phasicity and response to augmentation. LEFT UPPER EXTREMITY Internal Jugular Vein: No evidence of thrombus. Normal compressibility, respiratory phasicity and response to augmentation. Subclavian Vein: No evidence of thrombus. Normal compressibility, respiratory phasicity and response to augmentation. Axillary Vein: No evidence of thrombus. Normal compressibility, respiratory phasicity and response to augmentation. Cephalic Vein: No evidence of thrombus. Normal compressibility, respiratory phasicity and response to augmentation. Basilic Vein: No evidence  of thrombus. Normal compressibility, respiratory phasicity and response to augmentation. Brachial Veins: No evidence of thrombus. Normal compressibility, respiratory phasicity and response to augmentation. Radial Veins: No evidence of thrombus. Normal compressibility, respiratory phasicity and response to augmentation. Ulnar Veins: No evidence of thrombus. Normal compressibility, respiratory phasicity and response to augmentation. IMPRESSION: 1. Nonvisualization of the right subclavian vein due to overlying bandage. 2. Otherwise no evidence of deep venous thrombosis within either upper extremity. Electronically Signed   By: Randa Ngo M.D.   On: 01/12/2021 21:13   ECHOCARDIOGRAM COMPLETE  Result Date: 01/13/2021    ECHOCARDIOGRAM REPORT   Patient Name:   Scott Harding Date of Exam: 01/13/2021 Medical Rec #:  [id number]     Height:       70.0 in Accession #:    [id number]    Weight:       200.0 lb Date of Birth:  1956-01-30    BSA:          2.087 m Patient Age:    77 years      BP:           109/60 mmHg Patient Gender: M             HR:           53 bpm. Exam Location:  [facility] Procedure: 2D Echo, Cardiac Doppler and Color  Doppler Indications:     Pulmonary embolus I26.09  History:         Patient has prior history of Echocardiogram examinations, most                  recent 11/29/2020. CAD; Risk Factors:Hypertension.  Sonographer:     Sherrie Sport Referring Phys:  [id number] AMY N [facility] Diagnosing Phys: Isaias Cowman MD IMPRESSIONS  1. Left ventricular ejection fraction, by estimation, is 60 to 65%. The left ventricle has normal function. The left ventricle has no regional wall motion abnormalities. Left ventricular diastolic parameters were normal.  2. Right ventricular systolic function is normal. The right ventricular size is normal.  3. The mitral valve is normal in structure. Mild mitral valve regurgitation. No evidence of mitral stenosis.  4. The aortic valve is normal in structure. Aortic valve  regurgitation is not visualized. No aortic stenosis is present.  5. The inferior vena cava is normal in size with greater than 50% respiratory variability, suggesting right atrial pressure of 3 mmHg. FINDINGS  Left Ventricle: Left ventricular ejection fraction, by estimation, is 60 to 65%. The left ventricle has normal function. The left ventricle has no regional wall motion abnormalities. The left ventricular internal cavity size was normal in size. There is  no left ventricular hypertrophy. Left ventricular diastolic parameters were normal. Right Ventricle: The right ventricular size is normal. No increase in right ventricular wall thickness. Right ventricular systolic function is normal. Left Atrium: Left atrial size was normal in size. Right Atrium: Right atrial size was normal in size. Pericardium: There is no evidence of pericardial effusion. Mitral Valve: The mitral valve is normal in structure. Mild mitral valve regurgitation. No evidence of mitral valve stenosis. Tricuspid Valve: The tricuspid valve is normal in structure. Tricuspid valve regurgitation is mild . No evidence of tricuspid stenosis. Aortic Valve: The aortic valve is normal in structure. Aortic valve regurgitation is not visualized. No aortic stenosis is present. Aortic valve mean gradient measures 3.0 mmHg. Aortic valve peak gradient measures 5.7 mmHg. Aortic valve area, by VTI measures 2.57 cm. Pulmonic Valve: The pulmonic valve was normal in structure. Pulmonic valve regurgitation is not visualized. No evidence of pulmonic stenosis. Aorta: The aortic root is normal in size and structure. Venous: The inferior vena cava is normal in size with greater than 50% respiratory variability, suggesting right atrial pressure of 3 mmHg. IAS/Shunts: No atrial level shunt detected by color flow Doppler.  LEFT VENTRICLE PLAX 2D LVIDd:         3.80 cm  Diastology LVIDs:         2.60 cm  LV e' medial:    4.90 cm/s LV PW:         1.40 cm  LV E/e' medial:   15.7 LV IVS:        0.75 cm  LV e' lateral:   10.10 cm/s LVOT diam:     2.00 cm  LV E/e' lateral: 7.6 LV SV:         54 LV SV Index:   26 LVOT Area:     3.14 cm  RIGHT VENTRICLE RV Basal diam:  4.40 cm RV S prime:     12.60 cm/s TAPSE (M-mode): 4.5 cm LEFT ATRIUM             Index       RIGHT ATRIUM           Index LA diam:  4.00 cm 1.92 cm/m  RA Area:     14.80 cm LA Vol (A2C):   49.5 ml 23.71 ml/m RA Volume:   36.20 ml  17.34 ml/m LA Vol (A4C):   80.3 ml 38.47 ml/m LA Biplane Vol: 67.6 ml 32.39 ml/m  AORTIC VALVE                   PULMONIC VALVE AV Area (Vmax):    2.17 cm    PV Vmax:        0.82 m/s AV Area (Vmean):   2.17 cm    PV Peak grad:   2.7 mmHg AV Area (VTI):     2.57 cm    RVOT Peak grad: 5 mmHg AV Vmax:           119.00 cm/s AV Vmean:          73.850 cm/s AV VTI:            0.212 m AV Peak Grad:      5.7 mmHg AV Mean Grad:      3.0 mmHg LVOT Vmax:         82.20 cm/s LVOT Vmean:        50.900 cm/s LVOT VTI:          0.173 m LVOT/AV VTI ratio: 0.82  AORTA Ao Root diam: 2.70 cm MITRAL VALVE               TRICUSPID VALVE MV Area (PHT): 3.24 cm    TR Peak grad:   10.8 mmHg MV Decel Time: 234 msec    TR Vmax:        164.00 cm/s MV E velocity: 76.70 cm/s MV A velocity: 68.60 cm/s  SHUNTS MV E/A ratio:  1.12        Systemic VTI:  0.17 m                            Systemic Diam: 2.00 cm Isaias Cowman MD Electronically signed by Isaias Cowman MD Signature Date/Time: 01/13/2021/1:05:39 PM    Final    IR IMAGING GUIDED PORT INSERTION  Result Date: 01/01/2021 INDICATION: 65 year old with tonsillar squamous cell carcinoma. Port-A-Cath needed for chemotherapy. EXAM: FLUOROSCOPIC AND ULTRASOUND GUIDED PLACEMENT OF A SUBCUTANEOUS PORT COMPARISON:  None. MEDICATIONS: Moderate sedation ANESTHESIA/SEDATION: Versed 1.0 mg IV; Fentanyl 50 mcg IV; Moderate Sedation Time:  50 minutes The patient was continuously monitored during the procedure by the interventional radiology nurse under my direct  supervision. FLUOROSCOPY TIME:  1 minute, 12 seconds COMPLICATIONS: None immediate. PROCEDURE: The procedure, risks, benefits, and alternatives were explained to the patient. Questions regarding the procedure were encouraged and answered. The patient understands and consents to the procedure. Patient was placed supine on the interventional table. Ultrasound confirmed a patent right internal jugular vein. Ultrasound image was saved for documentation. The right chest and neck were cleaned with a skin antiseptic and a sterile drape was placed. Maximal barrier sterile technique was utilized including caps, mask, sterile gowns, sterile gloves, sterile drape, hand hygiene and skin antiseptic. The right neck was anesthetized with 1% lidocaine. Small incision was made in the right neck with a blade. Micropuncture set was placed in the right internal jugular vein with ultrasound guidance. The micropuncture wire was used for measurement purposes. The right chest was anesthetized with 1% lidocaine with epinephrine. #15 blade was used to make an incision and a subcutaneous port pocket was formed. 8 french  Power Port was assembled. Subcutaneous tunnel was formed with a stiff tunneling device. The port catheter was brought through the subcutaneous tunnel. The port was placed in the subcutaneous pocket. The micropuncture set was exchanged for a peel-away sheath. The catheter was placed through the peel-away sheath and the tip was positioned at the superior cavoatrial junction. Catheter placement was confirmed with fluoroscopy. The port was accessed and flushed with heparinized saline. The port pocket was closed using two layers of absorbable sutures and Dermabond. The vein skin site was closed using a single layer of absorbable suture and Dermabond. Sterile dressings were applied. Patient tolerated the procedure well without an immediate complication. Ultrasound and fluoroscopic images were taken and saved for this procedure.  FINDINGS: Patent right internal jugular vein. Enlarged lymph nodes adjacent to the right internal jugular vein. IMPRESSION: Placement of a subcutaneous power-injectable port device. Catheter tip at the superior cavoatrial junction. Electronically Signed   By: Markus Daft M.D.   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   PLAN:    1.  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  Both malignancies are p16 positive.  Patient has had an emergency trach placed secondary to airway protection.  PET scan results from December 16, 2020 reviewed independently with 3 metastatic lesions in patient's lung.  Case discussed with surgery and radiation oncology and it was determined to proceed with systemic chemotherapy using carboplatinum, Taxol, and Keytruda on day 1 with carboplatinum and Taxol only on day 8.  This will be a 21-day cycle.  Plan to do 4 cycles and then reimage followed by maintenance Keytruda.  Patient has now had port placement.  Proceed with cycle 2, day 1 of treatment today.  Return to clinic in 1 week for further evaluation and consideration of cycle 2, day 8. 2.  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  PET scan results from November 12, 2019 reviewed independently with no obvious evidence of recurrent or progressive disease.  Level 2 lymph node is no longer hypermetabolic.  3.  Renal insufficiency: Mild, patient's creatinine is 1.29. 4.  Anemia: Hemoglobin is trended down to 9.7, monitor. 5.  Hypokalemia: Resolved. 6.  Chemotherapy extravasation: Patient has not had port placement.  Patient has skin discoloration, but no obvious infection. 7.  Pulmonary embolism: Continue Eliquis as  prescribed.  Patient expressed understanding and was in agreement with this plan. He also understands that He can call clinic at any time with any questions, concerns, or complaints.   Cancer Staging Malignant neoplasm of supraglottis [facility]) Staging form: Larynx - Supraglottis, AJCC 8th Edition - Clinical stage from 12/11/2020: Stage IVA (cT3, cN2b, cM0) - Signed by Lloyd Huger, MD on 12/11/2020  Primary squamous cell carcinoma of tonsil ([place]) Staging form: Pharynx - HPV-Mediated Oropharynx, AJCC 8th Edition - Clinical: No stage assigned - Unsigned   Lloyd Huger, MD   01/20/2021 7:02 PM

## 2021-01-20 ENCOUNTER — Inpatient Hospital Stay (HOSPITAL_BASED_OUTPATIENT_CLINIC_OR_DEPARTMENT_OTHER): Payer: 59 | Admitting: Oncology

## 2021-01-20 ENCOUNTER — Inpatient Hospital Stay: Payer: 59

## 2021-01-20 ENCOUNTER — Ambulatory Visit: Payer: 59

## 2021-01-20 ENCOUNTER — Inpatient Hospital Stay: Payer: 59 | Attending: Oncology

## 2021-01-20 ENCOUNTER — Encounter: Payer: Self-pay | Admitting: Oncology

## 2021-01-20 VITALS — BP 114/72 | HR 67 | Temp 96.8°F | Resp 16 | Wt 198.8 lb

## 2021-01-20 DIAGNOSIS — Z79899 Other long term (current) drug therapy: Secondary | ICD-10-CM | POA: Diagnosis not present

## 2021-01-20 DIAGNOSIS — Z87891 Personal history of nicotine dependence: Secondary | ICD-10-CM | POA: Diagnosis not present

## 2021-01-20 DIAGNOSIS — Z5112 Encounter for antineoplastic immunotherapy: Secondary | ICD-10-CM | POA: Diagnosis present

## 2021-01-20 DIAGNOSIS — Z930 Tracheostomy status: Secondary | ICD-10-CM | POA: Insufficient documentation

## 2021-01-20 DIAGNOSIS — N189 Chronic kidney disease, unspecified: Secondary | ICD-10-CM | POA: Diagnosis not present

## 2021-01-20 DIAGNOSIS — Z86711 Personal history of pulmonary embolism: Secondary | ICD-10-CM | POA: Insufficient documentation

## 2021-01-20 DIAGNOSIS — C321 Malignant neoplasm of supraglottis: Secondary | ICD-10-CM

## 2021-01-20 DIAGNOSIS — Z5111 Encounter for antineoplastic chemotherapy: Secondary | ICD-10-CM | POA: Insufficient documentation

## 2021-01-20 DIAGNOSIS — I2699 Other pulmonary embolism without acute cor pulmonale: Secondary | ICD-10-CM | POA: Diagnosis not present

## 2021-01-20 DIAGNOSIS — D649 Anemia, unspecified: Secondary | ICD-10-CM | POA: Insufficient documentation

## 2021-01-20 DIAGNOSIS — C099 Malignant neoplasm of tonsil, unspecified: Secondary | ICD-10-CM | POA: Insufficient documentation

## 2021-01-20 LAB — CBC WITH DIFFERENTIAL/PLATELET
Abs Immature Granulocytes: 0.06 10*3/uL (ref 0.00–0.07)
Basophils Absolute: 0 10*3/uL (ref 0.0–0.1)
Basophils Relative: 1 %
Eosinophils Absolute: 0.1 10*3/uL (ref 0.0–0.5)
Eosinophils Relative: 2 %
HCT: 30.9 % — ABNORMAL LOW (ref 39.0–52.0)
Hemoglobin: 9.7 g/dL — ABNORMAL LOW (ref 13.0–17.0)
Immature Granulocytes: 2 %
Lymphocytes Relative: 24 %
Lymphs Abs: 0.8 10*3/uL (ref 0.7–4.0)
MCH: 31.6 pg (ref 26.0–34.0)
MCHC: 31.4 g/dL (ref 30.0–36.0)
MCV: 100.7 fL — ABNORMAL HIGH (ref 80.0–100.0)
Monocytes Absolute: 0.6 10*3/uL (ref 0.1–1.0)
Monocytes Relative: 18 %
Neutro Abs: 1.8 10*3/uL (ref 1.7–7.7)
Neutrophils Relative %: 53 %
Platelets: 254 10*3/uL (ref 150–400)
RBC: 3.07 MIL/uL — ABNORMAL LOW (ref 4.22–5.81)
RDW: 14 % (ref 11.5–15.5)
WBC: 3.2 10*3/uL — ABNORMAL LOW (ref 4.0–10.5)
nRBC: 0 % (ref 0.0–0.2)

## 2021-01-20 LAB — COMPREHENSIVE METABOLIC PANEL
ALT: 23 U/L (ref 0–44)
AST: 18 U/L (ref 15–41)
Albumin: 3.8 g/dL (ref 3.5–5.0)
Alkaline Phosphatase: 33 U/L — ABNORMAL LOW (ref 38–126)
Anion gap: 8 (ref 5–15)
BUN: 14 mg/dL (ref 8–23)
CO2: 24 mmol/L (ref 22–32)
Calcium: 8.7 mg/dL — ABNORMAL LOW (ref 8.9–10.3)
Chloride: 106 mmol/L (ref 98–111)
Creatinine, Ser: 1.29 mg/dL — ABNORMAL HIGH (ref 0.61–1.24)
GFR, Estimated: >60 mL/min (ref >60)
Glucose, Bld: 99 mg/dL (ref 70–99)
Potassium: 3.5 mmol/L (ref 3.5–5.1)
Sodium: 138 mmol/L (ref 135–145)
Total Bilirubin: 0.6 mg/dL (ref 0.3–1.2)
Total Protein: 6.9 g/dL (ref 6.5–8.1)

## 2021-01-20 LAB — TSH: TSH: 3.37 u[IU]/mL (ref 0.350–4.500)

## 2021-01-20 MED ORDER — DIPHENHYDRAMINE HCL 50 MG/ML IJ SOLN
25.0000 mg | Freq: Once | INTRAMUSCULAR | Status: AC
  Administered 2021-01-20: 25 mg via INTRAVENOUS
  Filled 2021-01-20: qty 1

## 2021-01-20 MED ORDER — SODIUM CHLORIDE 0.9 % IV SOLN
150.0000 mg | Freq: Once | INTRAVENOUS | Status: AC
  Administered 2021-01-20: 150 mg via INTRAVENOUS
  Filled 2021-01-20: qty 150

## 2021-01-20 MED ORDER — SODIUM CHLORIDE 0.9 % IV SOLN
200.0000 mg | Freq: Once | INTRAVENOUS | Status: AC
  Administered 2021-01-20: 200 mg via INTRAVENOUS
  Filled 2021-01-20: qty 8

## 2021-01-20 MED ORDER — SODIUM CHLORIDE 0.9 % IV SOLN
210.0000 mg | Freq: Once | INTRAVENOUS | Status: AC
  Administered 2021-01-20: 210 mg via INTRAVENOUS
  Filled 2021-01-20: qty 21

## 2021-01-20 MED ORDER — HEPARIN SOD (PORK) LOCK FLUSH 100 UNIT/ML IV SOLN
INTRAVENOUS | Status: AC
  Administered 2021-01-20: 500 [IU]
  Filled 2021-01-20: qty 5

## 2021-01-20 MED ORDER — SODIUM CHLORIDE 0.9 % IV SOLN
10.0000 mg | Freq: Once | INTRAVENOUS | Status: AC
  Administered 2021-01-20: 10 mg via INTRAVENOUS
  Filled 2021-01-20: qty 10

## 2021-01-20 MED ORDER — APIXABAN 5 MG PO TABS: 5.0000 mg | ORAL_TABLET | Freq: Two times a day (BID) | ORAL | 1 refills | Status: DC

## 2021-01-20 MED ORDER — HEPARIN SOD (PORK) LOCK FLUSH 100 UNIT/ML IV SOLN
500.0000 [IU] | Freq: Once | INTRAVENOUS | Status: AC | PRN
  Filled 2021-01-20: qty 5

## 2021-01-20 MED ORDER — SODIUM CHLORIDE 0.9 % IV SOLN
80.0000 mg/m2 | Freq: Once | INTRAVENOUS | Status: AC
  Administered 2021-01-20: 168 mg via INTRAVENOUS
  Filled 2021-01-20: qty 28

## 2021-01-20 MED ORDER — FAMOTIDINE 20 MG IN NS 100 ML IVPB
20.0000 mg | Freq: Once | INTRAVENOUS | Status: AC
  Administered 2021-01-20: 20 mg via INTRAVENOUS
  Filled 2021-01-20: qty 20

## 2021-01-20 MED ORDER — HEPARIN SOD (PORK) LOCK FLUSH 100 UNIT/ML IV SOLN
500.0000 [IU] | Freq: Once | INTRAVENOUS | Status: DC
  Filled 2021-01-20: qty 5

## 2021-01-20 MED ORDER — SODIUM CHLORIDE 0.9 % IV SOLN
Freq: Once | INTRAVENOUS | Status: AC
  Filled 2021-01-20: qty 250

## 2021-01-20 MED ORDER — PALONOSETRON HCL INJECTION 0.25 MG/5ML
0.2500 mg | Freq: Once | INTRAVENOUS | Status: AC
  Administered 2021-01-20: 0.25 mg via INTRAVENOUS
  Filled 2021-01-20: qty 5

## 2021-01-20 MED ORDER — SODIUM CHLORIDE 0.9% FLUSH
10.0000 mL | INTRAVENOUS | Status: DC | PRN
  Administered 2021-01-20: 10 mL via INTRAVENOUS
  Filled 2021-01-20: qty 10

## 2021-01-20 NOTE — Progress Notes (Signed)
This is patient's first outpatient visit since recent hospitalization.  He reports feeling better with improved appetite.

## 2021-01-20 NOTE — Patient Instructions (Signed)
[facility]  Discharge Instructions: Thank you for choosing [facility] to provide your oncology and hematology care.  If you have a lab appointment with the Cancer Center, please go directly to the Cancer Center and check in at the registration area.  Wear comfortable clothing and clothing appropriate for easy access to any Portacath or PICC line.   We strive to give you quality time with your provider. You may need to reschedule your appointment if you arrive late (15 or more minutes).  Arriving late affects you and other patients whose appointments are after yours.  Also, if you miss three or more appointments without notifying the office, you may be dismissed from the clinic at the provider's discretion.      For prescription refill requests, have your pharmacy contact our office and allow 72 hours for refills to be completed.    Today you received the following chemotherapy and/or immunotherapy agents Keytruda, Taxol and Carboplatin        To help prevent nausea and vomiting after your treatment, we encourage you to take your nausea medication as directed.  BELOW ARE SYMPTOMS THAT SHOULD BE REPORTED IMMEDIATELY: *FEVER GREATER THAN 100.4 F (38 C) OR HIGHER *CHILLS OR SWEATING *NAUSEA AND VOMITING THAT IS NOT CONTROLLED WITH YOUR NAUSEA MEDICATION *UNUSUAL SHORTNESS OF BREATH *UNUSUAL BRUISING OR BLEEDING *URINARY PROBLEMS (pain or burning when urinating, or frequent urination) *BOWEL PROBLEMS (unusual diarrhea, constipation, pain near the anus) TENDERNESS IN MOUTH AND THROAT WITH OR WITHOUT PRESENCE OF ULCERS (sore throat, sores in mouth, or a toothache) UNUSUAL RASH, SWELLING OR PAIN  UNUSUAL VAGINAL DISCHARGE OR ITCHING   Items with * indicate a potential emergency and should be followed up as soon as possible or go to the Emergency Department if any problems should occur.  Please show the CHEMOTHERAPY ALERT CARD or IMMUNOTHERAPY  ALERT CARD at check-in to the Emergency Department and triage nurse.  Should you have questions after your visit or need to cancel or reschedule your appointment, please contact [facility]  [phone] and follow the prompts.  Office hours are 8:00 a.m. to 4:30 p.m. Monday - Friday. Please note that voicemails left after 4:00 p.m. may not be returned until the following business day.  We are closed weekends and major holidays. You have access to a nurse at all times for urgent questions. Please call the main number to the clinic [phone] and follow the prompts.  For any non-urgent questions, you may also contact your provider using MyChart. We now offer e-Visits for anyone 18 and older to request care online for non-urgent symptoms. For details visit [email or web address].   Also download the MyChart app! Go to the app store, search "MyChart", open the app, select [facility], and log in with your MyChart username and password.  Due to Covid, a mask is required upon entering the hospital/clinic. If you do not have a mask, one will be given to you upon arrival. For doctor visits, patients may have 1 support person aged 18 or older with them. For treatment visits, patients cannot have anyone with them due to current Covid guidelines and our immunocompromised population.  

## 2021-01-20 NOTE — Progress Notes (Signed)
Nutrition Follow-up:   Patient with history of stage IV squamous cell carcinoma of left tonsil, p 16 +, now with second primary in supraglottis stage IV, p 16+.  S/p emergent trach on 8/12.  Patient receiving carbo, taxol, keytruda.  Met with patient during infusion. Note hospital admission with PE.  Patient says that he is continuing to eat soft foods. Tried chicken last night and stringy.  Does not like ensure type shakes.  Patient does not like ensure type shakes.      Medications: reviewed  Labs: reviewed  Anthropometrics:   Weight 198 lb today 205 lb on 9/20 205 lb on 9/13 217 lb on 05/20/20   NUTRITION DIAGNOSIS: Inadequate oral intake continues   INTERVENTION:  Reviewed ways to add calories to current eating pattern.  Encouraged trying lactose free products as patient lactose intolerant.  Must follow SLP recommendations regarding consistency.       MONITORING, EVALUATION, GOAL: weight trends, intake   NEXT VISIT: Tuesday, Oct 11 during infusion  Joli B. Allen, RD, LDN Registered Dietitian [phone] (mobile)   

## 2021-01-21 ENCOUNTER — Ambulatory Visit: Payer: 59

## 2021-01-21 LAB — T4: T4, Total: 6 ug/dL (ref 4.5–12.0)

## 2021-01-22 ENCOUNTER — Ambulatory Visit: Payer: 59

## 2021-01-23 ENCOUNTER — Ambulatory Visit: Payer: 59

## 2021-01-26 ENCOUNTER — Ambulatory Visit: Payer: 59

## 2021-01-26 NOTE — Progress Notes (Signed)
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  INTERVAL HISTORY: Patient returns to clinic today for further evaluation and consideration of cycle 2, day 8 which is carboplatinum and Taxol only.  He currently feels well and is asymptomatic. He does not complain of pain today.  He has no neurologic complaints.  He denies any recent fevers or illnesses. He has no chest pain, shortness of breath, cough, or hemoptysis.  He denies any nausea, vomiting, constipation, or diarrhea.  He has no urinary complaints.  Patient offers no specific complaints today.  REVIEW OF SYSTEMS:   Review of Systems  Constitutional: Negative.  Negative for fever, malaise/fatigue and weight loss.  Respiratory: Negative.  Negative for cough, hemoptysis and shortness of breath.   Cardiovascular: Negative.  Negative for chest pain and leg swelling.  Gastrointestinal: Negative.  Negative for abdominal pain.  Genitourinary: Negative.  Negative for dysuria.  Musculoskeletal: Negative.  Negative for neck pain.  Skin: Negative.  Negative for rash.  Neurological: Negative.  Negative for dizziness, focal weakness, weakness and headaches.  Psychiatric/Behavioral: Negative.  The patient is not nervous/anxious.    As per HPI. Otherwise, a complete review of systems is negative.  PAST MEDICAL HISTORY: Past Medical History:  Diagnosis Date   Cancer [facility])    Coronary artery disease    Hypertension     PAST SURGICAL HISTORY: Past  Surgical History:  Procedure Laterality Date   CORONARY ARTERY BYPASS GRAFT  2010   Quadruple   DIRECT LARYNGOSCOPY  11/28/2020   Procedure: DIRECT LARYNGOSCOPY WITH BIOPSY;  Surgeon: Beverly Gust, MD;  Location: [facility] ORS;  Service: ENT;;   IR IMAGING GUIDED PORT INSERTION  01/01/2021   PULMONARY THROMBECTOMY Bilateral 01/13/2021   Procedure: PULMONARY THROMBECTOMY;  Surgeon: Katha Cabal, MD;  Location: [place] CV LAB;  Service: Cardiovascular;  Laterality: Bilateral;   TRACHEOSTOMY TUBE PLACEMENT N/A 11/28/2020   Procedure: AWAKE TRACHEOSTOMY;  Surgeon: Beverly Gust, MD;  Location: [facility] ORS;  Service: ENT;  Laterality: N/A;    FAMILY HISTORY: Family History  Problem Relation Age of Onset   Arthritis Mother    Heart attack Father    Brain cancer Sister     ADVANCED DIRECTIVES (Y/N):  N  HEALTH MAINTENANCE: Social History   Tobacco Use   Smoking status: Former    Types: Cigarettes    Quit date: 10/17/2008    Years since quitting: 12.2   Smokeless tobacco: Never  Vaping Use   Vaping Use: Never used  Substance Use Topics   Alcohol use: Yes    Comment: occasionally   Drug use: Never     Colonoscopy:  PAP:  Bone density:  Lipid panel:  No Known Allergies  Current Outpatient Medications  Medication Sig Dispense Refill   ALPRAZolam (XANAX) 0.5 MG tablet Take 1 tablet (0.5 mg total) by mouth daily as needed for anxiety (Take 1 hour prior to radiation). 30 tablet 1   apixaban (ELIQUIS) 5 MG TABS tablet Take 1 tablet (5 mg total) by mouth 2 (two) times daily. 180 tablet 1   feeding  supplement (ENSURE ENLIVE / ENSURE PLUS) LIQD Take 237 mLs by mouth 3 (three) times daily between meals. 21330 mL 0   fluticasone (FLONASE) 50 MCG/ACT nasal spray Place 1 spray into both nostrils daily. 15.8 mL 0   hydrALAZINE (APRESOLINE) 50 MG tablet Take 50 mg by mouth 2 (two) times daily.     ipratropium-albuterol (DUONEB) 0.5-2.5 (3) MG/3ML SOLN Take 3 mLs by nebulization  every 6 (six) hours. 360 mL 0   ondansetron (ZOFRAN) 8 MG tablet Take 1 tablet (8 mg total) by mouth every 8 (eight) hours as needed for nausea or vomiting. 60 tablet 2   prochlorperazine (COMPAZINE) 10 MG tablet Take 1 tablet (10 mg total) by mouth every 6 (six) hours as needed for nausea or vomiting. 60 tablet 2   rosuvastatin (CRESTOR) 40 MG tablet Take 40 mg by mouth daily.     ascorbic acid (VITAMIN C) 500 MG tablet Take by mouth. (Patient not taking: Reported on 01/27/2021)     cyanocobalamin 1000 MCG tablet Take by mouth. (Patient not taking: Reported on 01/27/2021)     DENTA 5000 PLUS 1.1 % CREA dental cream BRUSH WITH PASTE 2 3 TIMES PER DAY FOR AT LEAST 1 MINUTE (Patient not taking: Reported on 01/27/2021)     No current facility-administered medications for this visit.   Facility-Administered Medications Ordered in Other Visits  Medication Dose Route Frequency Provider Last Rate Last Admin   CARBOplatin (PARAPLATIN) 210 mg in sodium chloride 0.9 % 250 mL chemo infusion  210 mg Intravenous Once Lloyd Huger, MD       heparin lock flush 100 unit/mL  500 Units Intravenous Once Lloyd Huger, MD       PACLitaxel (TAXOL) 168 mg in sodium chloride 0.9 % 250 mL chemo infusion (</= 80mg /m2)  80 mg/m2 (Treatment Plan Recorded) Intravenous Once Lloyd Huger, MD       sodium chloride flush (NS) 0.9 % injection 10 mL  10 mL Intravenous PRN Lloyd Huger, MD   10 mL at 01/06/21 1305    OBJECTIVE: There were no vitals filed for this visit.    There is no height or weight on file to calculate BMI.    ECOG FS:0 - Asymptomatic  General: Well-developed, well-nourished, no acute distress. Eyes: Pink conjunctiva, anicteric sclera. HEENT: Normocephalic, moist mucous membranes.  Trach in place. Lungs: No audible wheezing or coughing. Heart: Regular rate and rhythm. Abdomen: Soft, nontender, no obvious distention. Musculoskeletal: No edema, cyanosis, or clubbing. Neuro:  Alert, answering all questions appropriately. Cranial nerves grossly intact. Skin: No rashes or petechiae noted. Psych: Normal affect.   LAB RESULTS:  Lab Results  Component Value Date   NA 136 01/27/2021   K 3.5 01/27/2021   CL 105 01/27/2021   CO2 25 01/27/2021   GLUCOSE 102 (H) 01/27/2021   BUN 19 01/27/2021   CREATININE 1.22 01/27/2021   CALCIUM 8.8 (L) 01/27/2021   PROT 6.7 01/27/2021   ALBUMIN 3.6 01/27/2021   AST 14 (L) 01/27/2021   ALT 15 01/27/2021   ALKPHOS 34 (L) 01/27/2021   BILITOT 0.4 01/27/2021   GFRNONAA >60 01/27/2021   GFRAA 37 (L) 11/13/2019    Lab Results  Component Value Date   WBC 4.8 01/27/2021   NEUTROABS 3.5 01/27/2021   HGB 9.5 (L) 01/27/2021   HCT 29.2 (L) 01/27/2021   MCV 100.0 01/27/2021   PLT 235 01/27/2021     STUDIES: DG Chest 2 View  Result Date: 01/12/2021 CLINICAL  DATA:  Shortness of breath. EXAM: CHEST - 2 VIEW COMPARISON:  Chest radiograph 11/28/2020.  PET-CT 12/16/2020. FINDINGS: Tracheostomy tube remains in place overlying the airway. A right jugular Port-A-Cath terminates over the lower SVC. Sequelae of CABG are again identified. The cardiomediastinal silhouette is within normal limits. There is mild chronic coarsening of the interstitial markings. No acute airspace consolidation, edema, pleural effusion, pneumothorax is identified. Bilateral lung nodules were more fully evaluated on the prior PET-CT. No acute osseous abnormality is seen. IMPRESSION: No active cardiopulmonary disease. Electronically Signed   By: Logan Bores M.D.   On: 01/12/2021 10:02   CT Soft Tissue Neck Wo Contrast  Result Date: 01/12/2021 CLINICAL DATA:  History of supraglottic squamous cell carcinoma EXAM: CT NECK WITHOUT CONTRAST TECHNIQUE: Multidetector CT imaging of the neck was performed following the standard protocol without intravenous contrast. COMPARISON:  CT neck 11/27/2020 FINDINGS: Evaluation is degraded in the absence of intravenous contrast.  Pharynx and larynx: The nasal cavity and nasopharynx are unremarkable. The oral cavity and oropharynx appears stable, without evidence of recurrent oropharyngeal disease. There is bulky thickening of the epiglottis, supraglottic laryngeal mucosa, larynx, and hypopharyngeal mucosa. There is involvement of the paraglottic fat bilaterally with extension to the anterior commissure. There suspected infraglottic extension as before. There is complete effacement of the airway at the level of the larynx, increased since the prior study (2-72). The laryngeal cartilage is similar in appearance, without definite evidence of invasion. There is no evidence of extra laryngeal extension. Salivary glands: The parotid and submandibular glands are unremarkable. Thyroid: Unremarkable. Lymph nodes: A partially calcified left level II a lymph node measuring up to 1.6 cm AP x 1.7 cm TV by 2.1 cm cc appears slightly increased in size in the coronal plane. The apparent slight decrease in size in the axial plane may be due to differences in slice plane acquisition. The 1.1 cm by 1.2 cm by 1.5 cm right level IIa lymph node is minimally decreased in size in the axial plane. A 1.0 cm AP x 1.5 cm TV by 1.3 cm cc right level V lymph node appears minimally decreased in size (2-73, 6-75). The previously described right level III lymph node is not well evaluated in the absence of intravenous contrast but appears grossly similar. The previously described asymmetric right supraclavicular lymph node has decreased in size from 0.6 cm in short axis 2 approximately 0.3 cm (2-89). There are no new or enlarging cervical chain lymph nodes. Vascular: There is calcified atherosclerotic plaque of the carotid bulbs bilaterally, suboptimally evaluated in the absence of intravenous contrast. Limited intracranial: The imaged portions of the intracranial compartment are unremarkable. Visualized orbits: The imaged globes and orbits are unremarkable. Mastoids and  visualized paranasal sinuses: There is a right maxillary sinus mucous retention cyst, unchanged. The imaged paranasal sinuses are otherwise clear. The mastoid air cells are clear. Skeleton: There is multilevel degenerative change of the cervical spine, most advanced at C5-C6. There is no acute osseous abnormality or aggressive osseous lesion. Upper chest: A tracheostomy tube is in place terminating in the midthoracic trachea. Median sternotomy wires are noted. There is a right chest wall port in place. The lungs are assessed on the separately dictated CTA chest. Other: None. IMPRESSION: 1. Evaluation is degraded in the absence of intravenous contrast. 2. Interval increase in bulk of the laryngeal/hypopharyngeal malignancy as detailed above now with complete effacement of the laryngeal airway. 3. No definite evidence of the laryngeal cartilages or extralaryngeal extension. 4. Slight interval  decrease in size of some enlarged right cervical chain lymph nodes as above. No new or progressive cervical lymphadenopathy. Electronically Signed   By: Valetta Mole M.D.   On: 01/12/2021 14:26   CT Angio Chest PE W and/or Wo Contrast  Result Date: 01/12/2021 CLINICAL DATA:  Shortness of breath and chest pain. EXAM: CT ANGIOGRAPHY CHEST WITH CONTRAST TECHNIQUE: Multidetector CT imaging of the chest was performed using the standard protocol during bolus administration of intravenous contrast. Multiplanar CT image reconstructions and MIPs were obtained to evaluate the vascular anatomy. CONTRAST:  19mL OMNIPAQUE IOHEXOL 350 MG/ML SOLN COMPARISON:  PET 12/16/2020. FINDINGS: Cardiovascular: Image quality is degraded by respiratory motion. There are filling defects in the pulmonary arteries bilaterally, with the most proximal clot seen at the lobar level on the right. RV/LV ratio is 1.0. Right IJ Port-A-Cath terminates in the right atrium. Heart is enlarged. No pericardial effusion. Mediastinum/Nodes: No pathologically enlarged  mediastinal, hilar or axillary lymph nodes. Esophagus is grossly unremarkable. Lungs/Pleura: Bilateral pulmonary nodules measure slightly larger. Index nodule in the subpleural right middle lobe measures 1.7 cm (6/46), previously 1.5 cm. Second index nodule in the right lower lobe measures 2.2 cm (6/63), previously 1.8 cm. No pleural fluid. Tracheostomy in place. Upper Abdomen: Visualized portions of the liver, gallbladder, adrenal glands, spleen, pancreas, stomach and bowel are grossly unremarkable. 1.4 cm juxtadiaphragmatic lymph node (4/66), similar to 12/16/2020. Musculoskeletal: Degenerative changes in the spine. No worrisome lytic or sclerotic lesions. Review of the MIP images confirms the above findings. IMPRESSION: 1. Bilateral pulmonary emboli, with the most proximal clot seen at the lobar level on the right. Positive for acute PE with CT evidence of right heart strain (RV/LV Ratio = 1.0) consistent with at least submassive (intermediate risk) PE. The presence of right heart strain has been associated with an increased risk of morbidity and mortality. Please refer to the "PE Focused" order set in EPIC. Critical Value/emergent results were called by telephone at the time of interpretation on 01/12/2021 at 2:32 pm to provider [facility] , who verbally acknowledged these results. 2. Slight progression of pulmonary metastases. Electronically Signed   By: Lorin Picket M.D.   On: 01/12/2021 14:36   PERIPHERAL VASCULAR CATHETERIZATION  Result Date: 01/13/2021 See surgical note for result.  US Venous Img Lower Bilateral (DVT)  Result Date: 01/12/2021 CLINICAL DATA:  65 year old male with a history pulmonary embolism EXAM: BILATERAL LOWER EXTREMITY VENOUS DOPPLER ULTRASOUND TECHNIQUE: Gray-scale sonography with graded compression, as well as color Doppler and duplex ultrasound were performed to evaluate the lower extremity deep venous systems from the level of the common femoral vein and including  the common femoral, femoral, profunda femoral, popliteal and calf veins including the posterior tibial, peroneal and gastrocnemius veins when visible. The superficial great saphenous vein was also interrogated. Spectral Doppler was utilized to evaluate flow at rest and with distal augmentation maneuvers in the common femoral, femoral and popliteal veins. COMPARISON:  None. FINDINGS: RIGHT LOWER EXTREMITY Common Femoral Vein: No evidence of thrombus. Normal compressibility, respiratory phasicity and response to augmentation. Saphenofemoral Junction: No evidence of thrombus. Normal compressibility and flow on color Doppler imaging. Profunda Femoral Vein: No evidence of thrombus. Normal compressibility and flow on color Doppler imaging. Femoral Vein: No evidence of thrombus. Normal compressibility, respiratory phasicity and response to augmentation. Popliteal Vein: No evidence of thrombus. Normal compressibility, respiratory phasicity and response to augmentation. Calf Veins: No evidence of thrombus. Normal compressibility and flow on color Doppler imaging. Superficial Great Saphenous Vein:  No evidence of thrombus. Normal compressibility and flow on color Doppler imaging. Other Findings:  None. LEFT LOWER EXTREMITY Common Femoral Vein: No evidence of thrombus. Normal compressibility, respiratory phasicity and response to augmentation. Saphenofemoral Junction: No evidence of thrombus. Normal compressibility and flow on color Doppler imaging. Profunda Femoral Vein: No evidence of thrombus. Normal compressibility and flow on color Doppler imaging. Femoral Vein: No evidence of thrombus. Normal compressibility, respiratory phasicity and response to augmentation. Popliteal Vein: No evidence of thrombus. Normal compressibility, respiratory phasicity and response to augmentation. Calf Veins: No evidence of thrombus. Normal compressibility and flow on color Doppler imaging. Superficial Great Saphenous Vein: No evidence of  thrombus. Normal compressibility and flow on color Doppler imaging. Other Findings:  None. IMPRESSION: Sonographic survey of the bilateral lower extremities negative for DVT Electronically Signed   By: Corrie Mckusick D.O.   On: 01/12/2021 16:32   US Venous Img Upper Bilat (DVT)  Result Date: 01/12/2021 CLINICAL DATA:  Pulmonary embolus, metastatic head and neck cancer EXAM: BILATERAL UPPER EXTREMITY VENOUS DOPPLER ULTRASOUND TECHNIQUE: Gray-scale sonography with graded compression, as well as color Doppler and duplex ultrasound were performed to evaluate the bilateral upper extremity deep venous systems from the level of the subclavian vein and including the jugular, axillary, basilic, radial, ulnar and upper cephalic vein. Spectral Doppler was utilized to evaluate flow at rest and with distal augmentation maneuvers. COMPARISON:  None. FINDINGS: RIGHT UPPER EXTREMITY Internal Jugular Vein: No evidence of thrombus. Normal compressibility, respiratory phasicity and response to augmentation. Subclavian Vein: Not well visualized secondary to overlying bandage. Axillary Vein: No evidence of thrombus. Normal compressibility, respiratory phasicity and response to augmentation. Cephalic Vein: No evidence of thrombus. Normal compressibility, respiratory phasicity and response to augmentation. Basilic Vein: No evidence of thrombus. Normal compressibility, respiratory phasicity and response to augmentation. Brachial Veins: No evidence of thrombus. Normal compressibility, respiratory phasicity and response to augmentation. Radial Veins: No evidence of thrombus. Normal compressibility, respiratory phasicity and response to augmentation. Ulnar Veins: No evidence of thrombus. Normal compressibility, respiratory phasicity and response to augmentation. LEFT UPPER EXTREMITY Internal Jugular Vein: No evidence of thrombus. Normal compressibility, respiratory phasicity and response to augmentation. Subclavian Vein: No evidence of  thrombus. Normal compressibility, respiratory phasicity and response to augmentation. Axillary Vein: No evidence of thrombus. Normal compressibility, respiratory phasicity and response to augmentation. Cephalic Vein: No evidence of thrombus. Normal compressibility, respiratory phasicity and response to augmentation. Basilic Vein: No evidence of thrombus. Normal compressibility, respiratory phasicity and response to augmentation. Brachial Veins: No evidence of thrombus. Normal compressibility, respiratory phasicity and response to augmentation. Radial Veins: No evidence of thrombus. Normal compressibility, respiratory phasicity and response to augmentation. Ulnar Veins: No evidence of thrombus. Normal compressibility, respiratory phasicity and response to augmentation. IMPRESSION: 1. Nonvisualization of the right subclavian vein due to overlying bandage. 2. Otherwise no evidence of deep venous thrombosis within either upper extremity. Electronically Signed   By: Randa Ngo M.D.   On: 01/12/2021 21:13   ECHOCARDIOGRAM COMPLETE  Result Date: 01/13/2021    ECHOCARDIOGRAM REPORT   Patient Name:   Scott Harding Date of Exam: 01/13/2021 Medical Rec #:  [id number]     Height:       70.0 in Accession #:    [id number]    Weight:       200.0 lb Date of Birth:  [birth year]    BSA:          2.087 m Patient Age:    65 years  BP:           109/60 mmHg Patient Gender: M             HR:           53 bpm. Exam Location:  [facility] Procedure: 2D Echo, Cardiac Doppler and Color Doppler Indications:     Pulmonary embolus I26.09  History:         Patient has prior history of Echocardiogram examinations, most                  recent 11/29/2020. CAD; Risk Factors:Hypertension.  Sonographer:     Sherrie Sport Referring Phys:  [id number] AMY N [facility] Diagnosing Phys: Isaias Cowman MD IMPRESSIONS  1. Left ventricular ejection fraction, by estimation, is 60 to 65%. The left ventricle has normal function. The left ventricle has no regional  wall motion abnormalities. Left ventricular diastolic parameters were normal.  2. Right ventricular systolic function is normal. The right ventricular size is normal.  3. The mitral valve is normal in structure. Mild mitral valve regurgitation. No evidence of mitral stenosis.  4. The aortic valve is normal in structure. Aortic valve regurgitation is not visualized. No aortic stenosis is present.  5. The inferior vena cava is normal in size with greater than 50% respiratory variability, suggesting right atrial pressure of 3 mmHg. FINDINGS  Left Ventricle: Left ventricular ejection fraction, by estimation, is 60 to 65%. The left ventricle has normal function. The left ventricle has no regional wall motion abnormalities. The left ventricular internal cavity size was normal in size. There is  no left ventricular hypertrophy. Left ventricular diastolic parameters were normal. Right Ventricle: The right ventricular size is normal. No increase in right ventricular wall thickness. Right ventricular systolic function is normal. Left Atrium: Left atrial size was normal in size. Right Atrium: Right atrial size was normal in size. Pericardium: There is no evidence of pericardial effusion. Mitral Valve: The mitral valve is normal in structure. Mild mitral valve regurgitation. No evidence of mitral valve stenosis. Tricuspid Valve: The tricuspid valve is normal in structure. Tricuspid valve regurgitation is mild . No evidence of tricuspid stenosis. Aortic Valve: The aortic valve is normal in structure. Aortic valve regurgitation is not visualized. No aortic stenosis is present. Aortic valve mean gradient measures 3.0 mmHg. Aortic valve peak gradient measures 5.7 mmHg. Aortic valve area, by VTI measures 2.57 cm. Pulmonic Valve: The pulmonic valve was normal in structure. Pulmonic valve regurgitation is not visualized. No evidence of pulmonic stenosis. Aorta: The aortic root is normal in size and structure. Venous: The inferior  vena cava is normal in size with greater than 50% respiratory variability, suggesting right atrial pressure of 3 mmHg. IAS/Shunts: No atrial level shunt detected by color flow Doppler.  LEFT VENTRICLE PLAX 2D LVIDd:         3.80 cm  Diastology LVIDs:         2.60 cm  LV e' medial:    4.90 cm/s LV PW:         1.40 cm  LV E/e' medial:  15.7 LV IVS:        0.75 cm  LV e' lateral:   10.10 cm/s LVOT diam:     2.00 cm  LV E/e' lateral: 7.6 LV SV:         54 LV SV Index:   26 LVOT Area:     3.14 cm  RIGHT VENTRICLE RV Basal diam:  4.40 cm RV S prime:  12.60 cm/s TAPSE (M-mode): 4.5 cm LEFT ATRIUM             Index       RIGHT ATRIUM           Index LA diam:        4.00 cm 1.92 cm/m  RA Area:     14.80 cm LA Vol (A2C):   49.5 ml 23.71 ml/m RA Volume:   36.20 ml  17.34 ml/m LA Vol (A4C):   80.3 ml 38.47 ml/m LA Biplane Vol: 67.6 ml 32.39 ml/m  AORTIC VALVE                   PULMONIC VALVE AV Area (Vmax):    2.17 cm    PV Vmax:        0.82 m/s AV Area (Vmean):   2.17 cm    PV Peak grad:   2.7 mmHg AV Area (VTI):     2.57 cm    RVOT Peak grad: 5 mmHg AV Vmax:           119.00 cm/s AV Vmean:          73.850 cm/s AV VTI:            0.212 m AV Peak Grad:      5.7 mmHg AV Mean Grad:      3.0 mmHg LVOT Vmax:         82.20 cm/s LVOT Vmean:        50.900 cm/s LVOT VTI:          0.173 m LVOT/AV VTI ratio: 0.82  AORTA Ao Root diam: 2.70 cm MITRAL VALVE               TRICUSPID VALVE MV Area (PHT): 3.24 cm    TR Peak grad:   10.8 mmHg MV Decel Time: 234 msec    TR Vmax:        164.00 cm/s MV E velocity: 76.70 cm/s MV A velocity: 68.60 cm/s  SHUNTS MV E/A ratio:  1.12        Systemic VTI:  0.17 m                            Systemic Diam: 2.00 cm Isaias Cowman MD Electronically signed by Isaias Cowman MD Signature Date/Time: 01/13/2021/1:05:39 PM    Final    IR IMAGING GUIDED PORT INSERTION  Result Date: 01/01/2021 INDICATION: 65 year old with tonsillar squamous cell carcinoma. Port-A-Cath needed for  chemotherapy. EXAM: FLUOROSCOPIC AND ULTRASOUND GUIDED PLACEMENT OF A SUBCUTANEOUS PORT COMPARISON:  None. MEDICATIONS: Moderate sedation ANESTHESIA/SEDATION: Versed 1.0 mg IV; Fentanyl 50 mcg IV; Moderate Sedation Time:  50 minutes The patient was continuously monitored during the procedure by the interventional radiology nurse under my direct supervision. FLUOROSCOPY TIME:  1 minute, 12 seconds COMPLICATIONS: None immediate. PROCEDURE: The procedure, risks, benefits, and alternatives were explained to the patient. Questions regarding the procedure were encouraged and answered. The patient understands and consents to the procedure. Patient was placed supine on the interventional table. Ultrasound confirmed a patent right internal jugular vein. Ultrasound image was saved for documentation. The right chest and neck were cleaned with a skin antiseptic and a sterile drape was placed. Maximal barrier sterile technique was utilized including caps, mask, sterile gowns, sterile gloves, sterile drape, hand hygiene and skin antiseptic. The right neck was anesthetized with 1% lidocaine. Small incision was made in the right neck with a  blade. Micropuncture set was placed in the right internal jugular vein with ultrasound guidance. The micropuncture wire was used for measurement purposes. The right chest was anesthetized with 1% lidocaine with epinephrine. #15 blade was used to make an incision and a subcutaneous port pocket was formed. [place] was assembled. Subcutaneous tunnel was formed with a stiff tunneling device. The port catheter was brought through the subcutaneous tunnel. The port was placed in the subcutaneous pocket. The micropuncture set was exchanged for a peel-away sheath. The catheter was placed through the peel-away sheath and the tip was positioned at the superior cavoatrial junction. Catheter placement was confirmed with fluoroscopy. The port was accessed and flushed with heparinized saline. The  port pocket was closed using two layers of absorbable sutures and Dermabond. The vein skin site was closed using a single layer of absorbable suture and Dermabond. Sterile dressings were applied. Patient tolerated the procedure well without an immediate complication. Ultrasound and fluoroscopic images were taken and saved for this procedure. FINDINGS: Patent right internal jugular vein. Enlarged lymph nodes adjacent to the right internal jugular vein. IMPRESSION: Placement of a subcutaneous power-injectable port device. Catheter tip at the superior cavoatrial junction. Electronically Signed   By: Markus Daft M.D.   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   PLAN:    1.  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  Both malignancies are p16 positive.  Patient has had an emergency trach placed secondary to airway protection.  PET scan results from December 16, 2020 reviewed independently with 3 metastatic lesions in patient's lung.  Case discussed with surgery and radiation oncology and it was determined to proceed with systemic chemotherapy using carboplatinum, Taxol, and Keytruda on day 1 with carboplatinum and Taxol only on day 8.  This will be a 21-day cycle.  Plan to do 4 cycles and then reimage followed by maintenance Keytruda.  Patient has now had port placement.  Proceed with cycle 2, day 8 of treatment today which is carboplatinum and Taxol only.  Return to clinic in 2 weeks for further evaluation and consideration of cycle 3, day 1.   2.  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  PET scan results from November 12, 2019 reviewed independently with no obvious evidence of recurrent or progressive disease.   Level 2 lymph node is no longer hypermetabolic.  3.  Renal insufficiency: Nearly resolved.  Patient's creatinine is 1.22. 4.  Anemia: Chronic and unchanged.  Patient's hemoglobin is 9.5.  Monitor. 5.  Hypokalemia: Resolved. 6.  Chemotherapy extravasation: Patient now has a port.  Patient has skin discoloration, but no obvious infection. 7.  Pulmonary embolism: Diagnosed in September 2022.  Continue Eliquis as prescribed.  Patient will require a minimum of 6 months of treatment.  Patient expressed understanding and was in agreement with this plan. He also understands that He can call clinic at any time with any questions, concerns, or complaints.   Cancer Staging Malignant neoplasm of supraglottis [facility]) Staging form: Larynx - Supraglottis, AJCC 8th Edition - Clinical stage from 12/11/2020: Stage IVC (cT3, cN2b, cM1) - Signed by Lloyd Huger, MD on 01/26/2021  Primary squamous cell carcinoma of tonsil ([place])  Staging form: Pharynx - HPV-Mediated Oropharynx, AJCC 8th Edition - Clinical: No stage assigned - Unsigned   Lloyd Huger, MD   01/27/2021 10:10 AM

## 2021-01-27 ENCOUNTER — Encounter: Payer: Self-pay | Admitting: Oncology

## 2021-01-27 ENCOUNTER — Inpatient Hospital Stay: Payer: 59

## 2021-01-27 ENCOUNTER — Inpatient Hospital Stay (HOSPITAL_BASED_OUTPATIENT_CLINIC_OR_DEPARTMENT_OTHER): Payer: 59 | Admitting: Oncology

## 2021-01-27 ENCOUNTER — Other Ambulatory Visit: Payer: Self-pay

## 2021-01-27 ENCOUNTER — Ambulatory Visit: Payer: 59

## 2021-01-27 DIAGNOSIS — Z5111 Encounter for antineoplastic chemotherapy: Secondary | ICD-10-CM | POA: Diagnosis not present

## 2021-01-27 DIAGNOSIS — C321 Malignant neoplasm of supraglottis: Secondary | ICD-10-CM | POA: Diagnosis not present

## 2021-01-27 LAB — COMPREHENSIVE METABOLIC PANEL
ALT: 15 U/L (ref 0–44)
AST: 14 U/L — ABNORMAL LOW (ref 15–41)
Albumin: 3.6 g/dL (ref 3.5–5.0)
Alkaline Phosphatase: 34 U/L — ABNORMAL LOW (ref 38–126)
Anion gap: 6 (ref 5–15)
BUN: 19 mg/dL (ref 8–23)
CO2: 25 mmol/L (ref 22–32)
Calcium: 8.8 mg/dL — ABNORMAL LOW (ref 8.9–10.3)
Chloride: 105 mmol/L (ref 98–111)
Creatinine, Ser: 1.22 mg/dL (ref 0.61–1.24)
GFR, Estimated: >60 mL/min (ref >60)
Glucose, Bld: 102 mg/dL — ABNORMAL HIGH (ref 70–99)
Potassium: 3.5 mmol/L (ref 3.5–5.1)
Sodium: 136 mmol/L (ref 135–145)
Total Bilirubin: 0.4 mg/dL (ref 0.3–1.2)
Total Protein: 6.7 g/dL (ref 6.5–8.1)

## 2021-01-27 LAB — CBC WITH DIFFERENTIAL/PLATELET
Abs Immature Granulocytes: 0.03 10*3/uL (ref 0.00–0.07)
Basophils Absolute: 0 10*3/uL (ref 0.0–0.1)
Basophils Relative: 0 %
Eosinophils Absolute: 0.1 10*3/uL (ref 0.0–0.5)
Eosinophils Relative: 2 %
HCT: 29.2 % — ABNORMAL LOW (ref 39.0–52.0)
Hemoglobin: 9.5 g/dL — ABNORMAL LOW (ref 13.0–17.0)
Immature Granulocytes: 1 %
Lymphocytes Relative: 19 %
Lymphs Abs: 0.9 10*3/uL (ref 0.7–4.0)
MCH: 32.5 pg (ref 26.0–34.0)
MCHC: 32.5 g/dL (ref 30.0–36.0)
MCV: 100 fL (ref 80.0–100.0)
Monocytes Absolute: 0.2 10*3/uL (ref 0.1–1.0)
Monocytes Relative: 5 %
Neutro Abs: 3.5 10*3/uL (ref 1.7–7.7)
Neutrophils Relative %: 73 %
Platelets: 235 10*3/uL (ref 150–400)
RBC: 2.92 MIL/uL — ABNORMAL LOW (ref 4.22–5.81)
RDW: 14 % (ref 11.5–15.5)
WBC: 4.8 10*3/uL (ref 4.0–10.5)
nRBC: 0 % (ref 0.0–0.2)

## 2021-01-27 LAB — TSH: TSH: 1.831 u[IU]/mL (ref 0.350–4.500)

## 2021-01-27 MED ORDER — SODIUM CHLORIDE 0.9 % IV SOLN
80.0000 mg/m2 | Freq: Once | INTRAVENOUS | Status: AC
  Administered 2021-01-27: 168 mg via INTRAVENOUS
  Filled 2021-01-27: qty 28

## 2021-01-27 MED ORDER — SODIUM CHLORIDE 0.9 % IV SOLN
211.2000 mg | Freq: Once | INTRAVENOUS | Status: AC
  Administered 2021-01-27: 210 mg via INTRAVENOUS
  Filled 2021-01-27: qty 21

## 2021-01-27 MED ORDER — HEPARIN SOD (PORK) LOCK FLUSH 100 UNIT/ML IV SOLN
INTRAVENOUS | Status: AC
  Filled 2021-01-27: qty 5

## 2021-01-27 MED ORDER — HEPARIN SOD (PORK) LOCK FLUSH 100 UNIT/ML IV SOLN
500.0000 [IU] | Freq: Once | INTRAVENOUS | Status: AC | PRN
  Administered 2021-01-27: 500 [IU]
  Filled 2021-01-27: qty 5

## 2021-01-27 MED ORDER — FAMOTIDINE 20 MG IN NS 100 ML IVPB
20.0000 mg | Freq: Once | INTRAVENOUS | Status: AC
  Administered 2021-01-27: 20 mg via INTRAVENOUS
  Filled 2021-01-27: qty 20

## 2021-01-27 MED ORDER — PALONOSETRON HCL INJECTION 0.25 MG/5ML
0.2500 mg | Freq: Once | INTRAVENOUS | Status: AC
  Administered 2021-01-27: 0.25 mg via INTRAVENOUS
  Filled 2021-01-27: qty 5

## 2021-01-27 MED ORDER — SODIUM CHLORIDE 0.9 % IV SOLN
10.0000 mg | Freq: Once | INTRAVENOUS | Status: AC
  Administered 2021-01-27: 10 mg via INTRAVENOUS
  Filled 2021-01-27: qty 10

## 2021-01-27 MED ORDER — SODIUM CHLORIDE 0.9 % IV SOLN
Freq: Once | INTRAVENOUS | Status: AC
  Filled 2021-01-27: qty 250

## 2021-01-27 MED ORDER — DIPHENHYDRAMINE HCL 50 MG/ML IJ SOLN
25.0000 mg | Freq: Once | INTRAMUSCULAR | Status: AC
  Administered 2021-01-27: 25 mg via INTRAVENOUS
  Filled 2021-01-27: qty 1

## 2021-01-27 MED ORDER — SODIUM CHLORIDE 0.9 % IV SOLN
150.0000 mg | Freq: Once | INTRAVENOUS | Status: AC
  Administered 2021-01-27: 150 mg via INTRAVENOUS
  Filled 2021-01-27: qty 150

## 2021-01-27 NOTE — Progress Notes (Signed)
Patient would like to know when trach removal could be considered.  Also would like to know how long he will be taking Eliquis.  Most of his blood pressure medications were stopped during recent hospital admission and his blood pressure is getting elevated.

## 2021-01-27 NOTE — Progress Notes (Signed)
Nutrition Follow-up:  Patient with history of stage IV squamous cell carcinoma of left tonsil, p 16 + now with second primary in supraglottis stage IV, p 16 +.  S/p emergent trach on 8/12.  Patient receiving Norma Fredrickson, taxol, [place].    Met with patient during infusion.  Patient reports that he continues to try different foods and says that the tumor has shrunk and he is swallowing better.  Eating chicken, hamburger with bun, fruits, cooked vegetables, eggs, toast.  Has not tried lactose free products yet.  Drinking mostly water.  Does not like ensure/boost shakes    Medications: reviewed  Labs: reviewed  Anthropometrics:   Weight 201 lb 6.4 oz   198 lb on 10/4 205 lb on 9/20 205 lb on 9/13 217 lb on 05/20/20   NUTRITION DIAGNOSIS: Inadequate oral intake continues   INTERVENTION:  Patient to continue eating soft foods per SLP recommendations/guidance    MONITORING, EVALUATION, GOAL: weight trends, intake   NEXT VISIT: to be determined with treatment  Raven Harmes B. Zenia Resides, [place], [place] Registered Dietitian [phone] (mobile)

## 2021-01-27 NOTE — Patient Instructions (Signed)
[facility]  Discharge Instructions: Thank you for choosing [facility] to provide your oncology and hematology care.  If you have a lab appointment with the Cancer Center, please go directly to the Cancer Center and check in at the registration area.  Wear comfortable clothing and clothing appropriate for easy access to any Portacath or PICC line.   We strive to give you quality time with your provider. You may need to reschedule your appointment if you arrive late (15 or more minutes).  Arriving late affects you and other patients whose appointments are after yours.  Also, if you miss three or more appointments without notifying the office, you may be dismissed from the clinic at the provider's discretion.      For prescription refill requests, have your pharmacy contact our office and allow 72 hours for refills to be completed.    Today you received the following chemotherapy and/or immunotherapy agents TAXOL and CARBOPLATIN      To help prevent nausea and vomiting after your treatment, we encourage you to take your nausea medication as directed.  BELOW ARE SYMPTOMS THAT SHOULD BE REPORTED IMMEDIATELY: *FEVER GREATER THAN 100.4 F (38 C) OR HIGHER *CHILLS OR SWEATING *NAUSEA AND VOMITING THAT IS NOT CONTROLLED WITH YOUR NAUSEA MEDICATION *UNUSUAL SHORTNESS OF BREATH *UNUSUAL BRUISING OR BLEEDING *URINARY PROBLEMS (pain or burning when urinating, or frequent urination) *BOWEL PROBLEMS (unusual diarrhea, constipation, pain near the anus) TENDERNESS IN MOUTH AND THROAT WITH OR WITHOUT PRESENCE OF ULCERS (sore throat, sores in mouth, or a toothache) UNUSUAL RASH, SWELLING OR PAIN  UNUSUAL VAGINAL DISCHARGE OR ITCHING   Items with * indicate a potential emergency and should be followed up as soon as possible or go to the Emergency Department if any problems should occur.  Please show the CHEMOTHERAPY ALERT CARD or IMMUNOTHERAPY ALERT CARD  at check-in to the Emergency Department and triage nurse.  Should you have questions after your visit or need to cancel or reschedule your appointment, please contact [facility] [place] [facility] [facility]  [phone] and follow the prompts.  Office hours are 8:00 a.m. to 4:30 p.m. Monday - Friday. Please note that voicemails left after 4:00 p.m. may not be returned until the following business day.  We are closed weekends and major holidays. You have access to a nurse at all times for urgent questions. Please call the main number to the clinic [phone] and follow the prompts.  For any non-urgent questions, you may also contact your provider using MyChart. We now offer e-Visits for anyone 18 and older to request care online for non-urgent symptoms. For details visit [email or web address].   Also download the MyChart app! Go to the app store, search "MyChart", open the app, select [facility], and log in with your MyChart username and password.  Due to Covid, a mask is required upon entering the hospital/clinic. If you do not have a mask, one will be given to you upon arrival. For doctor visits, patients may have 1 support person aged 18 or older with them. For treatment visits, patients cannot have anyone with them due to current Covid guidelines and our immunocompromised population.   Paclitaxel injection What is this medication? PACLITAXEL (PAK li TAX el) is a chemotherapy drug. It targets fast dividing cells, like cancer cells, and causes these cells to die. This medicine is used to treat ovarian cancer, breast cancer, lung cancer, Kaposi's sarcoma, andother cancers. This medicine may be used for other purposes;   ask your health care provider orpharmacist if you have questions. COMMON BRAND NAME(S): Onxol, Taxol What should I tell my care team before I take this medication? They need to know if you have any of these conditions: history of irregular heartbeat liver disease low  blood counts, like low white cell, platelet, or red cell counts lung or breathing disease, like asthma tingling of the fingers or toes, or other nerve disorder an unusual or allergic reaction to paclitaxel, alcohol, polyoxyethylated castor oil, other chemotherapy, other medicines, foods, dyes, or preservatives pregnant or trying to get pregnant breast-feeding How should I use this medication? This drug is given as an infusion into a vein. It is administered in a hospitalor clinic by a specially trained health care professional. Talk to your pediatrician regarding the use of this medicine in children.Special care may be needed. Overdosage: If you think you have taken too much of this medicine contact apoison control center or emergency room at once. NOTE: This medicine is only for you. Do not share this medicine with others. What if I miss a dose? It is important not to miss your dose. Call your doctor or health careprofessional if you are unable to keep an appointment. What may interact with this medication? Do not take this medicine with any of the following medications: live virus vaccines This medicine may also interact with the following medications: antiviral medicines for hepatitis, HIV or AIDS certain antibiotics like erythromycin and clarithromycin certain medicines for fungal infections like ketoconazole and itraconazole certain medicines for seizures like carbamazepine, phenobarbital, phenytoin gemfibrozil nefazodone rifampin St. John's wort This list may not describe all possible interactions. Give your health care provider a list of all the medicines, herbs, non-prescription drugs, or dietary supplements you use. Also tell them if you smoke, drink alcohol, or use illegaldrugs. Some items may interact with your medicine. What should I watch for while using this medication? Your condition will be monitored carefully while you are receiving this medicine. You will need important  blood work done while you are taking thismedicine. This medicine can cause serious allergic reactions. To reduce your risk you will need to take other medicine(s) before treatment with this medicine. If you experience allergic reactions like skin rash, itching or hives, swelling of theface, lips, or tongue, tell your doctor or health care professional right away. In some cases, you may be given additional medicines to help with side effects.Follow all directions for their use. This drug may make you feel generally unwell. This is not uncommon, as chemotherapy can affect healthy cells as well as cancer cells. Report any side effects. Continue your course of treatment even though you feel ill unless yourdoctor tells you to stop. Call your doctor or health care professional for advice if you get a fever, chills or sore throat, or other symptoms of a cold or flu. Do not treat yourself. This drug decreases your body's ability to fight infections. Try toavoid being around people who are sick. This medicine may increase your risk to bruise or bleed. Call your doctor orhealth care professional if you notice any unusual bleeding. Be careful brushing and flossing your teeth or using a toothpick because you may get an infection or bleed more easily. If you have any dental work done,tell your dentist you are receiving this medicine. Avoid taking products that contain aspirin, acetaminophen, ibuprofen, naproxen, or ketoprofen unless instructed by your doctor. These medicines may hide afever. Do not become pregnant while taking this medicine. Women should inform their   doctor if they wish to become pregnant or think they might be pregnant. There is a potential for serious side effects to an unborn child. Talk to your health care professional or pharmacist for more information. Do not breast-feed aninfant while taking this medicine. Men are advised not to father a child while receiving this medicine. This product may  contain alcohol. Ask your pharmacist or healthcare provider if this medicine contains alcohol. Be sure to tell all healthcare providers you are taking this medicine. Certain medicines, like metronidazole and disulfiram, can cause an unpleasant reaction when taken with alcohol. The reaction includes flushing, headache, nausea, vomiting, sweating, and increased thirst. Thereaction can last from 30 minutes to several hours. What side effects may I notice from receiving this medication? Side effects that you should report to your doctor or health care professionalas soon as possible: allergic reactions like skin rash, itching or hives, swelling of the face, lips, or tongue breathing problems changes in vision fast, irregular heartbeat high or low blood pressure mouth sores pain, tingling, numbness in the hands or feet signs of decreased platelets or bleeding - bruising, pinpoint red spots on the skin, black, tarry stools, blood in the urine signs of decreased red blood cells - unusually weak or tired, feeling faint or lightheaded, falls signs of infection - fever or chills, cough, sore throat, pain or difficulty passing urine signs and symptoms of liver injury like dark yellow or brown urine; general ill feeling or flu-like symptoms; light-colored stools; loss of appetite; nausea; right upper belly pain; unusually weak or tired; yellowing of the eyes or skin swelling of the ankles, feet, hands unusually slow heartbeat Side effects that usually do not require medical attention (report to yourdoctor or health care professional if they continue or are bothersome): diarrhea hair loss loss of appetite muscle or joint pain nausea, vomiting pain, redness, or irritation at site where injected tiredness This list may not describe all possible side effects. Call your doctor for medical advice about side effects. You may report side effects to FDA at1-800-FDA-1088. Where should I keep my medication? This  drug is given in a hospital or clinic and will not be stored at home. NOTE: This sheet is a summary. It may not cover all possible information. If you have questions about this medicine, talk to your doctor, pharmacist, orhealth care provider.  2022 Elsevier/Gold Standard (2019-03-07 13:37:23)  Carboplatin injection What is this medication? CARBOPLATIN (KAR boe pla tin) is a chemotherapy drug. It targets fast dividing cells, like cancer cells, and causes these cells to die. This medicine is usedto treat ovarian cancer and many other cancers. This medicine may be used for other purposes; ask your health care provider orpharmacist if you have questions. COMMON BRAND NAME(S): Paraplatin What should I tell my care team before I take this medication? They need to know if you have any of these conditions: blood disorders hearing problems kidney disease recent or ongoing radiation therapy an unusual or allergic reaction to carboplatin, cisplatin, other chemotherapy, other medicines, foods, dyes, or preservatives pregnant or trying to get pregnant breast-feeding How should I use this medication? This drug is usually given as an infusion into a vein. It is administered in ahospital or clinic by a specially trained health care professional. Talk to your pediatrician regarding the use of this medicine in children.Special care may be needed. Overdosage: If you think you have taken too much of this medicine contact apoison control center or emergency room at once. NOTE: This medicine   is only for you. Do not share this medicine with others. What if I miss a dose? It is important not to miss a dose. Call your doctor or health careprofessional if you are unable to keep an appointment. What may interact with this medication? medicines for seizures medicines to increase blood counts like filgrastim, pegfilgrastim, sargramostim some antibiotics like amikacin, gentamicin, neomycin, streptomycin,  tobramycin vaccines Talk to your doctor or health care professional before taking any of thesemedicines: acetaminophen aspirin ibuprofen ketoprofen naproxen This list may not describe all possible interactions. Give your health care provider a list of all the medicines, herbs, non-prescription drugs, or dietary supplements you use. Also tell them if you smoke, drink alcohol, or use illegaldrugs. Some items may interact with your medicine. What should I watch for while using this medication? Your condition will be monitored carefully while you are receiving this medicine. You will need important blood work done while you are taking thismedicine. This drug may make you feel generally unwell. This is not uncommon, as chemotherapy can affect healthy cells as well as cancer cells. Report any side effects. Continue your course of treatment even though you feel ill unless yourdoctor tells you to stop. In some cases, you may be given additional medicines to help with side effects.Follow all directions for their use. Call your doctor or health care professional for advice if you get a fever, chills or sore throat, or other symptoms of a cold or flu. Do not treat yourself. This drug decreases your body's ability to fight infections. Try toavoid being around people who are sick. This medicine may increase your risk to bruise or bleed. Call your doctor orhealth care professional if you notice any unusual bleeding. Be careful brushing and flossing your teeth or using a toothpick because you may get an infection or bleed more easily. If you have any dental work done,tell your dentist you are receiving this medicine. Avoid taking products that contain aspirin, acetaminophen, ibuprofen, naproxen, or ketoprofen unless instructed by your doctor. These medicines may hide afever. Do not become pregnant while taking this medicine. Women should inform their doctor if they wish to become pregnant or think they might be  pregnant. There is a potential for serious side effects to an unborn child. Talk to your health care professional or pharmacist for more information. Do not breast-feed aninfant while taking this medicine. What side effects may I notice from receiving this medication? Side effects that you should report to your doctor or health care professionalas soon as possible: allergic reactions like skin rash, itching or hives, swelling of the face, lips, or tongue signs of infection - fever or chills, cough, sore throat, pain or difficulty passing urine signs of decreased platelets or bleeding - bruising, pinpoint red spots on the skin, black, tarry stools, nosebleeds signs of decreased red blood cells - unusually weak or tired, fainting spells, lightheadedness breathing problems changes in hearing changes in vision chest pain high blood pressure low blood counts - This drug may decrease the number of white blood cells, red blood cells and platelets. You may be at increased risk for infections and bleeding. nausea and vomiting pain, swelling, redness or irritation at the injection site pain, tingling, numbness in the hands or feet problems with balance, talking, walking trouble passing urine or change in the amount of urine Side effects that usually do not require medical attention (report to yourdoctor or health care professional if they continue or are bothersome): hair loss loss of appetite metallic   taste in the mouth or changes in taste This list may not describe all possible side effects. Call your doctor for medical advice about side effects. You may report side effects to FDA at1-800-FDA-1088. Where should I keep my medication? This drug is given in a hospital or clinic and will not be stored at home. NOTE: This sheet is a summary. It may not cover all possible information. If you have questions about this medicine, talk to your doctor, pharmacist, orhealth care provider.  2022 Elsevier/Gold  Standard (2007-07-11 14:38:05)   

## 2021-01-28 ENCOUNTER — Ambulatory Visit: Payer: 59

## 2021-01-28 LAB — T4: T4, Total: 6 ug/dL (ref 4.5–12.0)

## 2021-01-29 ENCOUNTER — Ambulatory Visit: Payer: 59

## 2021-01-30 ENCOUNTER — Ambulatory Visit: Payer: 59

## 2021-02-02 ENCOUNTER — Ambulatory Visit: Payer: 59

## 2021-02-03 ENCOUNTER — Ambulatory Visit: Payer: 59

## 2021-02-04 ENCOUNTER — Ambulatory Visit: Payer: 59

## 2021-02-05 ENCOUNTER — Ambulatory Visit: Payer: 59

## 2021-02-05 LAB — ACID FAST CULTURE WITH REFLEXED SENSITIVITIES (MYCOBACTERIA): Acid Fast Culture: NEGATIVE

## 2021-02-06 ENCOUNTER — Ambulatory Visit: Payer: 59

## 2021-02-06 NOTE — Progress Notes (Signed)
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  INTERVAL HISTORY: Patient returns to clinic today for further evaluation and consideration of cycle 3, day 1 of carboplatinum, Taxol, and Keytruda.  He continues to feel well and remains asymptomatic.  He does not complain of pain today.  He has no neurologic complaints.  He denies any recent fevers or illnesses. He has no chest pain, shortness of breath, cough, or hemoptysis.  He denies any nausea, vomiting, constipation, or diarrhea.  He has no urinary complaints.  Patient offers no specific complaints today.  REVIEW OF SYSTEMS:   Review of Systems  Constitutional: Negative.  Negative for fever, malaise/fatigue and weight loss.  Respiratory: Negative.  Negative for cough, hemoptysis and shortness of breath.   Cardiovascular: Negative.  Negative for chest pain and leg swelling.  Gastrointestinal: Negative.  Negative for abdominal pain.  Genitourinary: Negative.  Negative for dysuria.  Musculoskeletal: Negative.  Negative for neck pain.  Skin: Negative.  Negative for rash.  Neurological: Negative.  Negative for dizziness, focal weakness, weakness and headaches.  Psychiatric/Behavioral: Negative.  The patient is not nervous/anxious.    As per HPI. Otherwise, a complete review of systems is negative.  PAST MEDICAL HISTORY: Past Medical History:  Diagnosis Date   Cancer [facility])    Coronary artery disease    Hypertension     PAST SURGICAL  HISTORY: Past Surgical History:  Procedure Laterality Date   CORONARY ARTERY BYPASS GRAFT  2010   Quadruple   DIRECT LARYNGOSCOPY  11/28/2020   Procedure: DIRECT LARYNGOSCOPY WITH BIOPSY;  Surgeon: Beverly Gust, MD;  Location: [facility] ORS;  Service: ENT;;   IR IMAGING GUIDED PORT INSERTION  01/01/2021   PULMONARY THROMBECTOMY Bilateral 01/13/2021   Procedure: PULMONARY THROMBECTOMY;  Surgeon: Katha Cabal, MD;  Location: [facility];  Service: Cardiovascular;  Laterality: Bilateral;   TRACHEOSTOMY TUBE PLACEMENT N/A 11/28/2020   Procedure: AWAKE TRACHEOSTOMY;  Surgeon: Beverly Gust, MD;  Location: [facility] ORS;  Service: ENT;  Laterality: N/A;    FAMILY HISTORY: Family History  Problem Relation Age of Onset   Arthritis Mother    Heart attack Father    Brain cancer Sister     ADVANCED DIRECTIVES (Y/N):  N  HEALTH MAINTENANCE: Social History   Tobacco Use   Smoking status: Former    Types: Cigarettes    Quit date: 10/17/2008    Years since quitting: 12.3   Smokeless tobacco: Never  Vaping Use   Vaping Use: Never used  Substance Use Topics   Alcohol use: Yes    Comment: occasionally   Drug use: Never     Colonoscopy:  PAP:  Bone density:  Lipid panel:  No Known Allergies  Current Outpatient Medications  Medication Sig Dispense Refill   ALPRAZolam (XANAX) 0.5 MG tablet Take 1 tablet (0.5 mg total) by mouth daily as needed for anxiety (Take 1 hour prior to radiation). 30 tablet 1   apixaban (ELIQUIS) 5 MG TABS tablet Take 1 tablet (5 mg total) by mouth 2 (two) times daily. 180 tablet 1  feeding supplement (ENSURE ENLIVE / ENSURE PLUS) LIQD Take 237 mLs by mouth 3 (three) times daily between meals. 21330 mL 0   fluticasone (FLONASE) 50 MCG/ACT nasal spray Place 1 spray into both nostrils daily. 15.8 mL 0   hydrALAZINE (APRESOLINE) 50 MG tablet Take 50 mg by mouth 2 (two) times daily.     ipratropium-albuterol (DUONEB) 0.5-2.5 (3) MG/3ML SOLN Take 3 mLs by  nebulization every 6 (six) hours. 360 mL 0   ondansetron (ZOFRAN) 8 MG tablet Take 1 tablet (8 mg total) by mouth every 8 (eight) hours as needed for nausea or vomiting. 60 tablet 2   prochlorperazine (COMPAZINE) 10 MG tablet Take 1 tablet (10 mg total) by mouth every 6 (six) hours as needed for nausea or vomiting. 60 tablet 2   rosuvastatin (CRESTOR) 40 MG tablet Take 40 mg by mouth daily.     ascorbic acid (VITAMIN C) 500 MG tablet Take by mouth. (Patient not taking: No sig reported)     cyanocobalamin 1000 MCG tablet Take by mouth. (Patient not taking: No sig reported)     DENTA 5000 PLUS 1.1 % CREA dental cream BRUSH WITH PASTE 2 3 TIMES PER DAY FOR AT LEAST 1 MINUTE (Patient not taking: No sig reported)     No current facility-administered medications for this visit.   Facility-Administered Medications Ordered in Other Visits  Medication Dose Route Frequency Provider Last Rate Last Admin   CARBOplatin (PARAPLATIN) 210 mg in sodium chloride 0.9 % 250 mL chemo infusion  210 mg Intravenous Once Lloyd Huger, MD       dexamethasone (DECADRON) 10 mg in sodium chloride 0.9 % 50 mL IVPB  10 mg Intravenous Once Lloyd Huger, MD       famotidine (PEPCID) IVPB 20 mg in NS 100 mL IVPB  20 mg Intravenous Once Lloyd Huger, MD 400 mL/hr at 02/10/21 1003 20 mg at 02/10/21 1003   fosaprepitant (EMEND) 150 mg in sodium chloride 0.9 % 145 mL IVPB  150 mg Intravenous Once Lloyd Huger, MD       heparin lock flush 100 UNIT/ML injection            heparin lock flush 100 unit/mL  500 Units Intravenous Once Lloyd Huger, MD       PACLitaxel (TAXOL) 168 mg in sodium chloride 0.9 % 250 mL chemo infusion (</= 80mg /m2)  80 mg/m2 (Treatment Plan Recorded) Intravenous Once Lloyd Huger, MD       pembrolizumab [facility]) 200 mg in sodium chloride 0.9 % 50 mL chemo infusion  200 mg Intravenous Once Lloyd Huger, MD       sodium chloride flush (NS) 0.9 % injection 10 mL  10  mL Intravenous PRN Lloyd Huger, MD   10 mL at 01/06/21 1305    OBJECTIVE: Vitals:   02/10/21 0900  BP: 135/69  Pulse: 64  Resp: 16  Temp: (!) 97.4 F (36.3 C)  SpO2: 99%      Body mass index is 30.04 kg/m.    ECOG FS:0 - Asymptomatic  General: Well-developed, well-nourished, no acute distress. Eyes: Pink conjunctiva, anicteric sclera. HEENT: Normocephalic, moist mucous membranes.  Trach in place. Lungs: No audible wheezing or coughing. Heart: Regular rate and rhythm. Abdomen: Soft, nontender, no obvious distention. Musculoskeletal: No edema, cyanosis, or clubbing. Neuro: Alert, answering all questions appropriately. Cranial nerves grossly intact. Skin: No rashes or petechiae noted. Psych: Normal affect.  LAB RESULTS:  Lab Results  Component Value Date   NA 138 02/10/2021   K 3.3 (L) 02/10/2021   CL 109 02/10/2021   CO2 24 02/10/2021   GLUCOSE 99 02/10/2021   BUN 17 02/10/2021   CREATININE 1.34 (H) 02/10/2021   CALCIUM 8.5 (L) 02/10/2021   PROT 6.8 02/10/2021   ALBUMIN 3.6 02/10/2021   AST 14 (L) 02/10/2021   ALT 15 02/10/2021   ALKPHOS 36 (L) 02/10/2021   BILITOT 0.5 02/10/2021   GFRNONAA 59 (L) 02/10/2021   GFRAA 37 (L) 11/13/2019    Lab Results  Component Value Date   WBC 3.7 (L) 02/10/2021   NEUTROABS 2.2 02/10/2021   HGB 9.1 (L) 02/10/2021   HCT 28.1 (L) 02/10/2021   MCV 100.7 (H) 02/10/2021   PLT 195 02/10/2021     STUDIES: DG Chest 2 View  Result Date: 01/12/2021 CLINICAL DATA:  Shortness of breath. EXAM: CHEST - 2 VIEW COMPARISON:  Chest radiograph 11/28/2020.  PET-CT 12/16/2020. FINDINGS: Tracheostomy tube remains in place overlying the airway. A right jugular Port-A-Cath terminates over the lower SVC. Sequelae of CABG are again identified. The cardiomediastinal silhouette is within normal limits. There is mild chronic coarsening of the interstitial markings. No acute airspace consolidation, edema, pleural effusion, pneumothorax is  identified. Bilateral lung nodules were more fully evaluated on the prior PET-CT. No acute osseous abnormality is seen. IMPRESSION: No active cardiopulmonary disease. Electronically Signed   By: Logan Bores M.D.   On: 01/12/2021 10:02   CT Soft Tissue Neck Wo Contrast  Result Date: 01/12/2021 CLINICAL DATA:  History of supraglottic squamous cell carcinoma EXAM: CT NECK WITHOUT CONTRAST TECHNIQUE: Multidetector CT imaging of the neck was performed following the standard protocol without intravenous contrast. COMPARISON:  CT neck 11/27/2020 FINDINGS: Evaluation is degraded in the absence of intravenous contrast. Pharynx and larynx: The nasal cavity and nasopharynx are unremarkable. The oral cavity and oropharynx appears stable, without evidence of recurrent oropharyngeal disease. There is bulky thickening of the epiglottis, supraglottic laryngeal mucosa, larynx, and hypopharyngeal mucosa. There is involvement of the paraglottic fat bilaterally with extension to the anterior commissure. There suspected infraglottic extension as before. There is complete effacement of the airway at the level of the larynx, increased since the prior study (2-72). The laryngeal cartilage is similar in appearance, without definite evidence of invasion. There is no evidence of extra laryngeal extension. Salivary glands: The parotid and submandibular glands are unremarkable. Thyroid: Unremarkable. Lymph nodes: A partially calcified left level II a lymph node measuring up to 1.6 cm AP x 1.7 cm TV by 2.1 cm cc appears slightly increased in size in the coronal plane. The apparent slight decrease in size in the axial plane may be due to differences in slice plane acquisition. The 1.1 cm by 1.2 cm by 1.5 cm right level IIa lymph node is minimally decreased in size in the axial plane. A 1.0 cm AP x 1.5 cm TV by 1.3 cm cc right level V lymph node appears minimally decreased in size (2-73, 6-75). The previously described right level III lymph  node is not well evaluated in the absence of intravenous contrast but appears grossly similar. The previously described asymmetric right supraclavicular lymph node has decreased in size from 0.6 cm in short axis 2 approximately 0.3 cm (2-89). There are no new or enlarging cervical chain lymph nodes. Vascular: There is calcified atherosclerotic plaque of the carotid bulbs bilaterally, suboptimally evaluated in the absence of intravenous contrast. Limited intracranial: The imaged portions of the intracranial compartment  are unremarkable. Visualized orbits: The imaged globes and orbits are unremarkable. Mastoids and visualized paranasal sinuses: There is a right maxillary sinus mucous retention cyst, unchanged. The imaged paranasal sinuses are otherwise clear. The mastoid air cells are clear. Skeleton: There is multilevel degenerative change of the cervical spine, most advanced at C5-C6. There is no acute osseous abnormality or aggressive osseous lesion. Upper chest: A tracheostomy tube is in place terminating in the midthoracic trachea. Median sternotomy wires are noted. There is a right chest wall port in place. The lungs are assessed on the separately dictated CTA chest. Other: None. IMPRESSION: 1. Evaluation is degraded in the absence of intravenous contrast. 2. Interval increase in bulk of the laryngeal/hypopharyngeal malignancy as detailed above now with complete effacement of the laryngeal airway. 3. No definite evidence of the laryngeal cartilages or extralaryngeal extension. 4. Slight interval decrease in size of some enlarged right cervical chain lymph nodes as above. No new or progressive cervical lymphadenopathy. Electronically Signed   By: Valetta Mole M.D.   On: 01/12/2021 14:26   CT Angio Chest PE W and/or Wo Contrast  Result Date: 01/12/2021 CLINICAL DATA:  Shortness of breath and chest pain. EXAM: CT ANGIOGRAPHY CHEST WITH CONTRAST TECHNIQUE: Multidetector CT imaging of the chest was performed  using the standard protocol during bolus administration of intravenous contrast. Multiplanar CT image reconstructions and MIPs were obtained to evaluate the vascular anatomy. CONTRAST:  28mL OMNIPAQUE IOHEXOL 350 MG/ML SOLN COMPARISON:  PET 12/16/2020. FINDINGS: Cardiovascular: Image quality is degraded by respiratory motion. There are filling defects in the pulmonary arteries bilaterally, with the most proximal clot seen at the lobar level on the right. RV/LV ratio is 1.0. Right IJ Port-A-Cath terminates in the right atrium. Heart is enlarged. No pericardial effusion. Mediastinum/Nodes: No pathologically enlarged mediastinal, hilar or axillary lymph nodes. Esophagus is grossly unremarkable. Lungs/Pleura: Bilateral pulmonary nodules measure slightly larger. Index nodule in the subpleural right middle lobe measures 1.7 cm (6/46), previously 1.5 cm. Second index nodule in the right lower lobe measures 2.2 cm (6/63), previously 1.8 cm. No pleural fluid. Tracheostomy in place. Upper Abdomen: Visualized portions of the liver, gallbladder, adrenal glands, spleen, pancreas, stomach and bowel are grossly unremarkable. 1.4 cm juxtadiaphragmatic lymph node (4/66), similar to 12/16/2020. Musculoskeletal: Degenerative changes in the spine. No worrisome lytic or sclerotic lesions. Review of the MIP images confirms the above findings. IMPRESSION: 1. Bilateral pulmonary emboli, with the most proximal clot seen at the lobar level on the right. Positive for acute PE with CT evidence of right heart strain (RV/LV Ratio = 1.0) consistent with at least submassive (intermediate risk) PE. The presence of right heart strain has been associated with an increased risk of morbidity and mortality. Please refer to the "PE Focused" order set in EPIC. Critical Value/emergent results were called by telephone at the time of interpretation on 01/12/2021 at 2:32 pm to provider [facility] , who verbally acknowledged these results. 2. Slight  progression of pulmonary metastases. Electronically Signed   By: Lorin Picket M.D.   On: 01/12/2021 14:36   PERIPHERAL VASCULAR CATHETERIZATION  Result Date: 01/13/2021 See surgical note for result.  US Venous Img Lower Bilateral (DVT)  Result Date: 01/12/2021 CLINICAL DATA:  65 year old male with a history pulmonary embolism EXAM: BILATERAL LOWER EXTREMITY VENOUS DOPPLER ULTRASOUND TECHNIQUE: Gray-scale sonography with graded compression, as well as color Doppler and duplex ultrasound were performed to evaluate the lower extremity deep venous systems from the level of the common femoral vein and including  the common femoral, femoral, profunda femoral, popliteal and calf veins including the posterior tibial, peroneal and gastrocnemius veins when visible. The superficial great saphenous vein was also interrogated. Spectral Doppler was utilized to evaluate flow at rest and with distal augmentation maneuvers in the common femoral, femoral and popliteal veins. COMPARISON:  None. FINDINGS: RIGHT LOWER EXTREMITY Common Femoral Vein: No evidence of thrombus. Normal compressibility, respiratory phasicity and response to augmentation. Saphenofemoral Junction: No evidence of thrombus. Normal compressibility and flow on color Doppler imaging. Profunda Femoral Vein: No evidence of thrombus. Normal compressibility and flow on color Doppler imaging. Femoral Vein: No evidence of thrombus. Normal compressibility, respiratory phasicity and response to augmentation. Popliteal Vein: No evidence of thrombus. Normal compressibility, respiratory phasicity and response to augmentation. Calf Veins: No evidence of thrombus. Normal compressibility and flow on color Doppler imaging. Superficial Great Saphenous Vein: No evidence of thrombus. Normal compressibility and flow on color Doppler imaging. Other Findings:  None. LEFT LOWER EXTREMITY Common Femoral Vein: No evidence of thrombus. Normal compressibility, respiratory phasicity  and response to augmentation. Saphenofemoral Junction: No evidence of thrombus. Normal compressibility and flow on color Doppler imaging. Profunda Femoral Vein: No evidence of thrombus. Normal compressibility and flow on color Doppler imaging. Femoral Vein: No evidence of thrombus. Normal compressibility, respiratory phasicity and response to augmentation. Popliteal Vein: No evidence of thrombus. Normal compressibility, respiratory phasicity and response to augmentation. Calf Veins: No evidence of thrombus. Normal compressibility and flow on color Doppler imaging. Superficial Great Saphenous Vein: No evidence of thrombus. Normal compressibility and flow on color Doppler imaging. Other Findings:  None. IMPRESSION: Sonographic survey of the bilateral lower extremities negative for DVT Electronically Signed   By: Corrie Mckusick D.O.   On: 01/12/2021 16:32   US Venous Img Upper Bilat (DVT)  Result Date: 01/12/2021 CLINICAL DATA:  Pulmonary embolus, metastatic head and neck cancer EXAM: BILATERAL UPPER EXTREMITY VENOUS DOPPLER ULTRASOUND TECHNIQUE: Gray-scale sonography with graded compression, as well as color Doppler and duplex ultrasound were performed to evaluate the bilateral upper extremity deep venous systems from the level of the subclavian vein and including the jugular, axillary, basilic, radial, ulnar and upper cephalic vein. Spectral Doppler was utilized to evaluate flow at rest and with distal augmentation maneuvers. COMPARISON:  None. FINDINGS: RIGHT UPPER EXTREMITY Internal Jugular Vein: No evidence of thrombus. Normal compressibility, respiratory phasicity and response to augmentation. Subclavian Vein: Not well visualized secondary to overlying bandage. Axillary Vein: No evidence of thrombus. Normal compressibility, respiratory phasicity and response to augmentation. Cephalic Vein: No evidence of thrombus. Normal compressibility, respiratory phasicity and response to augmentation. Basilic Vein: No  evidence of thrombus. Normal compressibility, respiratory phasicity and response to augmentation. Brachial Veins: No evidence of thrombus. Normal compressibility, respiratory phasicity and response to augmentation. Radial Veins: No evidence of thrombus. Normal compressibility, respiratory phasicity and response to augmentation. Ulnar Veins: No evidence of thrombus. Normal compressibility, respiratory phasicity and response to augmentation. LEFT UPPER EXTREMITY Internal Jugular Vein: No evidence of thrombus. Normal compressibility, respiratory phasicity and response to augmentation. Subclavian Vein: No evidence of thrombus. Normal compressibility, respiratory phasicity and response to augmentation. Axillary Vein: No evidence of thrombus. Normal compressibility, respiratory phasicity and response to augmentation. Cephalic Vein: No evidence of thrombus. Normal compressibility, respiratory phasicity and response to augmentation. Basilic Vein: No evidence of thrombus. Normal compressibility, respiratory phasicity and response to augmentation. Brachial Veins: No evidence of thrombus. Normal compressibility, respiratory phasicity and response to augmentation. Radial Veins: No evidence of thrombus. Normal compressibility, respiratory phasicity and  response to augmentation. Ulnar Veins: No evidence of thrombus. Normal compressibility, respiratory phasicity and response to augmentation. IMPRESSION: 1. Nonvisualization of the right subclavian vein due to overlying bandage. 2. Otherwise no evidence of deep venous thrombosis within either upper extremity. Electronically Signed   By: Randa Ngo M.D.   On: 01/12/2021 21:13   ECHOCARDIOGRAM COMPLETE  Result Date: 01/13/2021    ECHOCARDIOGRAM REPORT   Patient Name:   Scott Harding Date of Exam: 01/13/2021 Medical Rec #:  [id number]     Height:       70.0 in Accession #:    [id number]    Weight:       200.0 lb Date of Birth:  1955-04-23    BSA:          2.087 m Patient Age:     37 years      BP:           109/60 mmHg Patient Gender: M             HR:           53 bpm. Exam Location:  [facility] Procedure: 2D Echo, Cardiac Doppler and Color Doppler Indications:     Pulmonary embolus I26.09  History:         Patient has prior history of Echocardiogram examinations, most                  recent 11/29/2020. CAD; Risk Factors:Hypertension.  Sonographer:     Sherrie Sport Referring Phys:  [id number] AMY N [facility] Diagnosing Phys: Isaias Cowman MD IMPRESSIONS  1. Left ventricular ejection fraction, by estimation, is 60 to 65%. The left ventricle has normal function. The left ventricle has no regional wall motion abnormalities. Left ventricular diastolic parameters were normal.  2. Right ventricular systolic function is normal. The right ventricular size is normal.  3. The mitral valve is normal in structure. Mild mitral valve regurgitation. No evidence of mitral stenosis.  4. The aortic valve is normal in structure. Aortic valve regurgitation is not visualized. No aortic stenosis is present.  5. The inferior vena cava is normal in size with greater than 50% respiratory variability, suggesting right atrial pressure of 3 mmHg. FINDINGS  Left Ventricle: Left ventricular ejection fraction, by estimation, is 60 to 65%. The left ventricle has normal function. The left ventricle has no regional wall motion abnormalities. The left ventricular internal cavity size was normal in size. There is  no left ventricular hypertrophy. Left ventricular diastolic parameters were normal. Right Ventricle: The right ventricular size is normal. No increase in right ventricular wall thickness. Right ventricular systolic function is normal. Left Atrium: Left atrial size was normal in size. Right Atrium: Right atrial size was normal in size. Pericardium: There is no evidence of pericardial effusion. Mitral Valve: The mitral valve is normal in structure. Mild mitral valve regurgitation. No evidence of mitral valve stenosis. Tricuspid  Valve: The tricuspid valve is normal in structure. Tricuspid valve regurgitation is mild . No evidence of tricuspid stenosis. Aortic Valve: The aortic valve is normal in structure. Aortic valve regurgitation is not visualized. No aortic stenosis is present. Aortic valve mean gradient measures 3.0 mmHg. Aortic valve peak gradient measures 5.7 mmHg. Aortic valve area, by VTI measures 2.57 cm. Pulmonic Valve: The pulmonic valve was normal in structure. Pulmonic valve regurgitation is not visualized. No evidence of pulmonic stenosis. Aorta: The aortic root is normal in size and structure. Venous: The inferior vena cava is normal in size  with greater than 50% respiratory variability, suggesting right atrial pressure of 3 mmHg. IAS/Shunts: No atrial level shunt detected by color flow Doppler.  LEFT VENTRICLE PLAX 2D LVIDd:         3.80 cm  Diastology LVIDs:         2.60 cm  LV e' medial:    4.90 cm/s LV PW:         1.40 cm  LV E/e' medial:  15.7 LV IVS:        0.75 cm  LV e' lateral:   10.10 cm/s LVOT diam:     2.00 cm  LV E/e' lateral: 7.6 LV SV:         54 LV SV Index:   26 LVOT Area:     3.14 cm  RIGHT VENTRICLE RV Basal diam:  4.40 cm RV S prime:     12.60 cm/s TAPSE (M-mode): 4.5 cm LEFT ATRIUM             Index       RIGHT ATRIUM           Index LA diam:        4.00 cm 1.92 cm/m  RA Area:     14.80 cm LA Vol (A2C):   49.5 ml 23.71 ml/m RA Volume:   36.20 ml  17.34 ml/m LA Vol (A4C):   80.3 ml 38.47 ml/m LA Biplane Vol: 67.6 ml 32.39 ml/m  AORTIC VALVE                   PULMONIC VALVE AV Area (Vmax):    2.17 cm    PV Vmax:        0.82 m/s AV Area (Vmean):   2.17 cm    PV Peak grad:   2.7 mmHg AV Area (VTI):     2.57 cm    RVOT Peak grad: 5 mmHg AV Vmax:           119.00 cm/s AV Vmean:          73.850 cm/s AV VTI:            0.212 m AV Peak Grad:      5.7 mmHg AV Mean Grad:      3.0 mmHg LVOT Vmax:         82.20 cm/s LVOT Vmean:        50.900 cm/s LVOT VTI:          0.173 m LVOT/AV VTI ratio: 0.82  AORTA Ao  Root diam: 2.70 cm MITRAL VALVE               TRICUSPID VALVE MV Area (PHT): 3.24 cm    TR Peak grad:   10.8 mmHg MV Decel Time: 234 msec    TR Vmax:        164.00 cm/s MV E velocity: 76.70 cm/s MV A velocity: 68.60 cm/s  SHUNTS MV E/A ratio:  1.12        Systemic VTI:  0.17 m                            Systemic Diam: 2.00 cm Isaias Cowman MD Electronically signed by Isaias Cowman MD Signature Date/Time: 01/13/2021/1:05:39 PM    Final     ASSESSMENT: 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   PLAN:  1.  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  Both malignancies are p16 positive.  Patient has had an emergency trach placed secondary to airway protection.  PET scan results from December 16, 2020 reviewed independently with 3 metastatic lesions in patient's lung.  Case discussed with surgery and radiation oncology and it was determined to proceed with systemic chemotherapy using carboplatinum, Taxol, and Keytruda on day 1 with carboplatinum and Taxol only on day 8.  This will be a 21-day cycle.  Plan to do 4 cycles and then reimage followed by maintenance Keytruda.  Patient has now had port placement.  Proceed with cycle 3, day 1 of treatment today.  Return to clinic in 1 week for further evaluation and consideration of cycle 3, day 8.   2.  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  PET scan results from November 12, 2019 reviewed independently with no obvious evidence of recurrent or progressive disease.  Level 2 lymph node is no longer hypermetabolic.  3.  Renal insufficiency: Creatinine is trended up slightly to 1.34, monitor. 4.  Anemia: Chronic and unchanged.  Patient's hemoglobin is 9.1 today. 5.  Hypokalemia: Mild,  patient was given dietary changes. 6.  Chemotherapy extravasation: Patient now has a port.  Patient has skin discoloration, but no obvious infection. 7.  Pulmonary embolism: Diagnosed in September 2022.  Continue Eliquis as prescribed.  Patient will require a minimum of 6 months of treatment. 8.  Leukopenia: Mild, monitor.  Proceed with treatment as above.  Patient expressed understanding and was in agreement with this plan. He also understands that He can call clinic at any time with any questions, concerns, or complaints.   Cancer Staging Malignant neoplasm of supraglottis [facility]) Staging form: Larynx - Supraglottis, AJCC 8th Edition - Clinical stage from 12/11/2020: Stage IVC (cT3, cN2b, cM1) - Signed by Lloyd Huger, MD on 01/26/2021  Primary squamous cell carcinoma of tonsil ([place]) Staging form: Pharynx - HPV-Mediated Oropharynx, AJCC 8th Edition - Clinical: No stage assigned - Unsigned   Lloyd Huger, MD   02/10/2021 10:15 AM

## 2021-02-09 ENCOUNTER — Ambulatory Visit: Payer: 59

## 2021-02-10 ENCOUNTER — Inpatient Hospital Stay (HOSPITAL_BASED_OUTPATIENT_CLINIC_OR_DEPARTMENT_OTHER): Payer: 59 | Admitting: Oncology

## 2021-02-10 ENCOUNTER — Other Ambulatory Visit: Payer: Self-pay

## 2021-02-10 ENCOUNTER — Inpatient Hospital Stay: Payer: 59

## 2021-02-10 ENCOUNTER — Ambulatory Visit: Payer: 59

## 2021-02-10 VITALS — BP 135/69 | HR 64 | Temp 97.4°F | Resp 16 | Wt 203.4 lb

## 2021-02-10 DIAGNOSIS — Z5111 Encounter for antineoplastic chemotherapy: Secondary | ICD-10-CM | POA: Diagnosis not present

## 2021-02-10 DIAGNOSIS — C321 Malignant neoplasm of supraglottis: Secondary | ICD-10-CM

## 2021-02-10 LAB — COMPREHENSIVE METABOLIC PANEL
ALT: 15 U/L (ref 0–44)
AST: 14 U/L — ABNORMAL LOW (ref 15–41)
Albumin: 3.6 g/dL (ref 3.5–5.0)
Alkaline Phosphatase: 36 U/L — ABNORMAL LOW (ref 38–126)
Anion gap: 5 (ref 5–15)
BUN: 17 mg/dL (ref 8–23)
CO2: 24 mmol/L (ref 22–32)
Calcium: 8.5 mg/dL — ABNORMAL LOW (ref 8.9–10.3)
Chloride: 109 mmol/L (ref 98–111)
Creatinine, Ser: 1.34 mg/dL — ABNORMAL HIGH (ref 0.61–1.24)
GFR, Estimated: 59 mL/min — ABNORMAL LOW (ref >60)
Glucose, Bld: 99 mg/dL (ref 70–99)
Potassium: 3.3 mmol/L — ABNORMAL LOW (ref 3.5–5.1)
Sodium: 138 mmol/L (ref 135–145)
Total Bilirubin: 0.5 mg/dL (ref 0.3–1.2)
Total Protein: 6.8 g/dL (ref 6.5–8.1)

## 2021-02-10 LAB — CBC WITH DIFFERENTIAL/PLATELET
Abs Immature Granulocytes: 0.01 10*3/uL (ref 0.00–0.07)
Basophils Absolute: 0 10*3/uL (ref 0.0–0.1)
Basophils Relative: 1 %
Eosinophils Absolute: 0 10*3/uL (ref 0.0–0.5)
Eosinophils Relative: 1 %
HCT: 28.1 % — ABNORMAL LOW (ref 39.0–52.0)
Hemoglobin: 9.1 g/dL — ABNORMAL LOW (ref 13.0–17.0)
Immature Granulocytes: 0 %
Lymphocytes Relative: 21 %
Lymphs Abs: 0.8 10*3/uL (ref 0.7–4.0)
MCH: 32.6 pg (ref 26.0–34.0)
MCHC: 32.4 g/dL (ref 30.0–36.0)
MCV: 100.7 fL — ABNORMAL HIGH (ref 80.0–100.0)
Monocytes Absolute: 0.6 10*3/uL (ref 0.1–1.0)
Monocytes Relative: 17 %
Neutro Abs: 2.2 10*3/uL (ref 1.7–7.7)
Neutrophils Relative %: 60 %
Platelets: 195 10*3/uL (ref 150–400)
RBC: 2.79 MIL/uL — ABNORMAL LOW (ref 4.22–5.81)
RDW: 14.5 % (ref 11.5–15.5)
WBC: 3.7 10*3/uL — ABNORMAL LOW (ref 4.0–10.5)
nRBC: 0 % (ref 0.0–0.2)

## 2021-02-10 LAB — TSH: TSH: 1.22 u[IU]/mL (ref 0.350–4.500)

## 2021-02-10 MED ORDER — SODIUM CHLORIDE 0.9 % IV SOLN
10.0000 mg | Freq: Once | INTRAVENOUS | Status: AC
  Administered 2021-02-10: 10 mg via INTRAVENOUS
  Filled 2021-02-10: qty 10

## 2021-02-10 MED ORDER — SODIUM CHLORIDE 0.9 % IV SOLN
80.0000 mg/m2 | Freq: Once | INTRAVENOUS | Status: AC
  Administered 2021-02-10: 168 mg via INTRAVENOUS
  Filled 2021-02-10: qty 28

## 2021-02-10 MED ORDER — FAMOTIDINE 20 MG IN NS 100 ML IVPB
20.0000 mg | Freq: Once | INTRAVENOUS | Status: AC
  Administered 2021-02-10: 20 mg via INTRAVENOUS
  Filled 2021-02-10: qty 20

## 2021-02-10 MED ORDER — HEPARIN SOD (PORK) LOCK FLUSH 100 UNIT/ML IV SOLN
500.0000 [IU] | Freq: Once | INTRAVENOUS | Status: AC | PRN
  Administered 2021-02-10: 500 [IU]
  Filled 2021-02-10: qty 5

## 2021-02-10 MED ORDER — SODIUM CHLORIDE 0.9 % IV SOLN
200.0000 mg | Freq: Once | INTRAVENOUS | Status: AC
  Administered 2021-02-10: 200 mg via INTRAVENOUS
  Filled 2021-02-10: qty 8

## 2021-02-10 MED ORDER — PALONOSETRON HCL INJECTION 0.25 MG/5ML
0.2500 mg | Freq: Once | INTRAVENOUS | Status: AC
Start: 2021-02-10 — End: 2021-02-10
  Administered 2021-02-10: 0.25 mg via INTRAVENOUS
  Filled 2021-02-10: qty 5

## 2021-02-10 MED ORDER — DIPHENHYDRAMINE HCL 50 MG/ML IJ SOLN
25.0000 mg | Freq: Once | INTRAMUSCULAR | Status: AC
  Administered 2021-02-10: 25 mg via INTRAVENOUS
  Filled 2021-02-10: qty 1

## 2021-02-10 MED ORDER — SODIUM CHLORIDE 0.9 % IV SOLN
150.0000 mg | Freq: Once | INTRAVENOUS | Status: AC
  Administered 2021-02-10: 150 mg via INTRAVENOUS
  Filled 2021-02-10: qty 150

## 2021-02-10 MED ORDER — SODIUM CHLORIDE 0.9 % IV SOLN
210.0000 mg | Freq: Once | INTRAVENOUS | Status: AC
  Administered 2021-02-10: 210 mg via INTRAVENOUS
  Filled 2021-02-10: qty 21

## 2021-02-10 MED ORDER — SODIUM CHLORIDE 0.9 % IV SOLN
Freq: Once | INTRAVENOUS | Status: AC
  Filled 2021-02-10: qty 250

## 2021-02-10 NOTE — Progress Notes (Signed)
Nutrition Follow-up:  Patient with history of stage IV squamous cell carcinoma of left tonsil, p 16+ now with second primary in supraglottis stage IV, p 16+.  S/p emergent trach on 8/12.  Patient receiving Norma Fredrickson, taxol, [place].   Met with patient during infusion.  Patient reports that his appetite is good and he is feeling hungry.  Says he is eating eggs and toast or pop tart for breakfast. Recently had spaghetti, chicken pot pie and fried okra, and steak.  Has to remind himself to chew foods well.  Does not like oral nutrition supplements.  Patient is lactose intolerant.    Medications: reviewed  Labs: reviewed  Anthropometrics:   Weight 203 lb today  201 lb 6.4 oz on 10/11 198 lb on 10/4 205 lb on 9/20 205 lb on 9/13 217 lb on 05/20/20   NUTRITION DIAGNOSIS: Inadequate oral intake improving   INTERVENTION:  Patient to continue eating soft foods per SLP recommendations/guidance.    MONITORING, EVALUATION, GOAL: weight trends, intake   NEXT VISIT: to be determined during treatment  Lawsen Arnott B. Zenia Resides, [place], [place] Registered Dietitian [phone] (mobile)

## 2021-02-10 NOTE — Patient Instructions (Signed)
CANCER CENTER [place] [facility]  Discharge Instructions: Thank you for choosing [facility] to provide your oncology and hematology care.  If you have a lab appointment with the Cancer Center, please go directly to the Cancer Center and check in at the registration area.  Wear comfortable clothing and clothing appropriate for easy access to any Portacath or PICC line.   We strive to give you quality time with your provider. You may need to reschedule your appointment if you arrive late (15 or more minutes).  Arriving late affects you and other patients whose appointments are after yours.  Also, if you miss three or more appointments without notifying the office, you may be dismissed from the clinic at the provider's discretion.      For prescription refill requests, have your pharmacy contact our office and allow 72 hours for refills to be completed.    Today you received the following chemotherapy and/or immunotherapy agents : Keytruda / Taxol / Carboplatin   To help prevent nausea and vomiting after your treatment, we encourage you to take your nausea medication as directed.  BELOW ARE SYMPTOMS THAT SHOULD BE REPORTED IMMEDIATELY: *FEVER GREATER THAN 100.4 F (38 C) OR HIGHER *CHILLS OR SWEATING *NAUSEA AND VOMITING THAT IS NOT CONTROLLED WITH YOUR NAUSEA MEDICATION *UNUSUAL SHORTNESS OF BREATH *UNUSUAL BRUISING OR BLEEDING *URINARY PROBLEMS (pain or burning when urinating, or frequent urination) *BOWEL PROBLEMS (unusual diarrhea, constipation, pain near the anus) TENDERNESS IN MOUTH AND THROAT WITH OR WITHOUT PRESENCE OF ULCERS (sore throat, sores in mouth, or a toothache) UNUSUAL RASH, SWELLING OR PAIN  UNUSUAL VAGINAL DISCHARGE OR ITCHING   Items with * indicate a potential emergency and should be followed up as soon as possible or go to the Emergency Department if any problems should occur.  Please show the CHEMOTHERAPY ALERT CARD or IMMUNOTHERAPY  ALERT CARD at check-in to the Emergency Department and triage nurse.  Should you have questions after your visit or need to cancel or reschedule your appointment, please contact CANCER CENTER [place] [facility]  [phone] and follow the prompts.  Office hours are 8:00 a.m. to 4:30 p.m. Monday - Friday. Please note that voicemails left after 4:00 p.m. may not be returned until the following business day.  We are closed weekends and major holidays. You have access to a nurse at all times for urgent questions. Please call the main number to the clinic [phone] and follow the prompts.  For any non-urgent questions, you may also contact your provider using MyChart. We now offer e-Visits for anyone 18 and older to request care online for non-urgent symptoms. For details visit [email or web address].   Also download the MyChart app! Go to the app store, search "MyChart", open the app, select [place], and log in with your MyChart username and password.  Due to Covid, a mask is required upon entering the hospital/clinic. If you do not have a mask, one will be given to you upon arrival. For doctor visits, patients may have 1 support person aged 18 or older with them. For treatment visits, patients cannot have anyone with them due to current Covid guidelines and our immunocompromised population.  

## 2021-02-10 NOTE — Progress Notes (Signed)
Pt has no new concerns/complaints at this time 

## 2021-02-11 ENCOUNTER — Ambulatory Visit: Payer: 59

## 2021-02-11 LAB — T4: T4, Total: 5.7 ug/dL (ref 4.5–12.0)

## 2021-02-12 ENCOUNTER — Ambulatory Visit: Payer: 59

## 2021-02-12 ENCOUNTER — Other Ambulatory Visit (HOSPITAL_COMMUNITY): Payer: Self-pay

## 2021-02-12 ENCOUNTER — Encounter: Payer: Self-pay | Admitting: Oncology

## 2021-02-12 NOTE — Progress Notes (Signed)
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  INTERVAL HISTORY: Patient returns to clinic today for further evaluation and consideration of cycle 3, day 8 of carboplatinum, Taxol, and Keytruda.  Carboplatinum and Taxol only today.  He continues to tolerate his treatments well without significant side effects.  He currently feels well and is asymptomatic. He does not complain of pain today.  He has no neurologic complaints.  He denies any recent fevers or illnesses. He has no chest pain, shortness of breath, cough, or hemoptysis.  He denies any nausea, vomiting, constipation, or diarrhea.  He has no urinary complaints.  Patient offers no specific complaints today.  REVIEW OF SYSTEMS:   Review of Systems  Constitutional: Negative.  Negative for fever, malaise/fatigue and weight loss.  Respiratory: Negative.  Negative for cough, hemoptysis and shortness of breath.   Cardiovascular: Negative.  Negative for chest pain and leg swelling.  Gastrointestinal: Negative.  Negative for abdominal pain.  Genitourinary: Negative.  Negative for dysuria.  Musculoskeletal: Negative.  Negative for neck pain.  Skin: Negative.  Negative for rash.  Neurological: Negative.  Negative for dizziness, focal weakness, weakness and headaches.  Psychiatric/Behavioral: Negative.  The patient is not nervous/anxious.    As per HPI. Otherwise, a complete review of systems is negative.  PAST MEDICAL HISTORY: Past Medical History:   Diagnosis Date   Cancer [facility])    Coronary artery disease    Hypertension     PAST SURGICAL HISTORY: Past Surgical History:  Procedure Laterality Date   CORONARY ARTERY BYPASS GRAFT  2010   Quadruple   DIRECT LARYNGOSCOPY  11/28/2020   Procedure: DIRECT LARYNGOSCOPY WITH BIOPSY;  Surgeon: Beverly Gust, MD;  Location: [facility] ORS;  Service: ENT;;   IR IMAGING GUIDED PORT INSERTION  01/01/2021   PULMONARY THROMBECTOMY Bilateral 01/13/2021   Procedure: PULMONARY THROMBECTOMY;  Surgeon: Katha Cabal, MD;  Location: [place] [facility];  Service: Cardiovascular;  Laterality: Bilateral;   TRACHEOSTOMY TUBE PLACEMENT N/A 11/28/2020   Procedure: AWAKE TRACHEOSTOMY;  Surgeon: Beverly Gust, MD;  Location: [facility] ORS;  Service: ENT;  Laterality: N/A;    FAMILY HISTORY: Family History  Problem Relation Age of Onset   Arthritis Mother    Heart attack Father    Brain cancer Sister     ADVANCED DIRECTIVES (Y/N):  N  HEALTH MAINTENANCE: Social History   Tobacco Use   Smoking status: Former    Types: Cigarettes    Quit date: 10/17/2008    Years since quitting: 12.3   Smokeless tobacco: Never  Vaping Use   Vaping Use: Never used  Substance Use Topics   Alcohol use: Yes    Comment: occasionally   Drug use: Never     Colonoscopy:  PAP:  Bone density:  Lipid panel:  No Known Allergies  Current Outpatient Medications  Medication Sig Dispense Refill   ALPRAZolam (XANAX) 0.5 MG tablet Take 1 tablet (0.5 mg total) by mouth daily as needed for anxiety (Take 1 hour prior to radiation). 30 tablet 1   apixaban (ELIQUIS) 5 MG TABS tablet Take  1 tablet (5 mg total) by mouth 2 (two) times daily. 180 tablet 1   feeding supplement (ENSURE ENLIVE / ENSURE PLUS) LIQD Take 237 mLs by mouth 3 (three) times daily between meals. 21330 mL 0   fluticasone (FLONASE) 50 MCG/ACT nasal spray Place 1 spray into both nostrils daily. 15.8 mL 0   hydrALAZINE (APRESOLINE) 50 MG tablet Take 50 mg by  mouth 2 (two) times daily.     ipratropium-albuterol (DUONEB) 0.5-2.5 (3) MG/3ML SOLN Take 3 mLs by nebulization every 6 (six) hours. 360 mL 0   ondansetron (ZOFRAN) 8 MG tablet Take 1 tablet (8 mg total) by mouth every 8 (eight) hours as needed for nausea or vomiting. 60 tablet 2   prochlorperazine (COMPAZINE) 10 MG tablet Take 1 tablet (10 mg total) by mouth every 6 (six) hours as needed for nausea or vomiting. 60 tablet 2   rosuvastatin (CRESTOR) 40 MG tablet Take 40 mg by mouth daily.     ascorbic acid (VITAMIN C) 500 MG tablet Take by mouth. (Patient not taking: No sig reported)     cyanocobalamin 1000 MCG tablet Take by mouth. (Patient not taking: No sig reported)     DENTA 5000 PLUS 1.1 % CREA dental cream BRUSH WITH PASTE 2 3 TIMES PER DAY FOR AT LEAST 1 MINUTE (Patient not taking: No sig reported)     No current facility-administered medications for this visit.   Facility-Administered Medications Ordered in Other Visits  Medication Dose Route Frequency Provider Last Rate Last Admin   heparin lock flush 100 UNIT/ML injection            heparin lock flush 100 unit/mL  500 Units Intravenous Once Grayland Ormond, Kathlene November, MD       heparin lock flush 100 unit/mL  500 Units Intravenous Once Lloyd Huger, MD       sodium chloride flush (NS) 0.9 % injection 10 mL  10 mL Intravenous PRN Lloyd Huger, MD   10 mL at 01/06/21 1305   sodium chloride flush (NS) 0.9 % injection 10 mL  10 mL Intravenous PRN Lloyd Huger, MD   10 mL at 02/17/21 0845    OBJECTIVE: Vitals:   02/17/21 0855  BP: (!) 120/55  Pulse: 65  Resp: 16  Temp: (!) 97.2 F (36.2 C)  SpO2: 100%      Body mass index is 29.82 kg/m.    ECOG FS:0 - Asymptomatic  General: Well-developed, well-nourished, no acute distress. Eyes: Pink conjunctiva, anicteric sclera. HEENT: Normocephalic, moist mucous membranes.  Trach in place. Lungs: No audible wheezing or coughing. Heart: Regular rate and rhythm. Abdomen:  Soft, nontender, no obvious distention. Musculoskeletal: No edema, cyanosis, or clubbing. Neuro: Alert, answering all questions appropriately. Cranial nerves grossly intact. Skin: No rashes or petechiae noted. Psych: Normal affect.   LAB RESULTS:  Lab Results  Component Value Date   NA 137 02/17/2021   K 3.8 02/17/2021   CL 105 02/17/2021   CO2 25 02/17/2021   GLUCOSE 100 (H) 02/17/2021   BUN 22 02/17/2021   CREATININE 1.48 (H) 02/17/2021   CALCIUM 8.6 (L) 02/17/2021   PROT 6.9 02/17/2021   ALBUMIN 3.7 02/17/2021   AST 15 02/17/2021   ALT 16 02/17/2021   ALKPHOS 38 02/17/2021   BILITOT 0.4 02/17/2021   GFRNONAA 52 (L) 02/17/2021   GFRAA 37 (L) 11/13/2019    Lab Results  Component Value Date   WBC 4.8 02/17/2021   NEUTROABS 3.2 02/17/2021  HGB 9.7 (L) 02/17/2021   HCT 30.0 (L) 02/17/2021   MCV 100.7 (H) 02/17/2021   PLT 208 02/17/2021     STUDIES: No results found.  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   PLAN:    1.  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  Both malignancies are p16 positive.  Patient has had an emergency trach placed secondary to airway protection.  PET scan results from December 16, 2020 reviewed independently with 3 metastatic lesions in patient's lung.  Case discussed with surgery and radiation oncology and it was determined to proceed with systemic chemotherapy using carboplatinum, Taxol, and Keytruda on day 1 with carboplatinum and Taxol only on day 8.  This will be a 21-day cycle.  Plan to do 4 cycles and then reimage followed by maintenance Keytruda.  Patient has now had port placement.  Proceed with cycle 3, day 8 of treatment today, carboplatinum and Taxol only.  Return to clinic in 2 weeks for further evaluation and consideration of cycle 4,  day 1. 2.  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  PET scan results from November 12, 2019 reviewed independently with no obvious evidence of recurrent or progressive disease.  Level 2 lymph node is no longer hypermetabolic.  3.  Renal insufficiency: Creatinine continues to slowly trend up to 1.48.  Proceed with treatment as above and encourage increased fluid intake. 4.  Anemia: Chronic and unchanged.  Patient's hemoglobin is slightly improved to 9.7. 5.  Hypokalemia: Resolved.   6.  Chemotherapy extravasation: Patient now has a port.  Patient has skin discoloration, but no obvious infection. 7.  Pulmonary embolism: Diagnosed in September 2022.  Continue Eliquis as prescribed.  Patient will require a minimum of 6 months of treatment. 8.  Leukopenia: Resolved.  Patient expressed understanding and was in agreement with this plan. He also understands that He can call clinic at any time with any questions, concerns, or complaints.   Cancer Staging Malignant neoplasm of supraglottis [facility]) Staging form: Larynx - Supraglottis, AJCC 8th Edition - Clinical stage from 12/11/2020: Stage IVC (cT3, cN2b, cM1) - Signed by Lloyd Huger, MD on 01/26/2021  Primary squamous cell carcinoma of tonsil ([place]) Staging form: Pharynx - HPV-Mediated Oropharynx, AJCC 8th Edition - Clinical: No stage assigned - Unsigned   Lloyd Huger, MD   02/17/2021 9:32 AM

## 2021-02-13 ENCOUNTER — Telehealth: Payer: Self-pay | Admitting: Pharmacy Technician

## 2021-02-13 ENCOUNTER — Ambulatory Visit: Payer: 59

## 2021-02-13 NOTE — Telephone Encounter (Signed)
Oral Oncology Patient Advocate Encounter   Was successful in obtaining a copay card for Eliquis.  This copay card will make the patients copay $10.  I have spoken with the patient.    The billing information is as follows and has been shared with [facility] [employer] [address], [place].   RxBin: [id number] PCN: [id number] Member ID: [id number] Group ID: [id number]  Card expires 24 months after activation then patient must re-enroll.   [place] Patient [name] Phone [phone] Fax [phone] 02/13/2021 3:30 PM

## 2021-02-16 ENCOUNTER — Other Ambulatory Visit: Payer: Self-pay | Admitting: Oncology

## 2021-02-17 ENCOUNTER — Inpatient Hospital Stay (HOSPITAL_BASED_OUTPATIENT_CLINIC_OR_DEPARTMENT_OTHER): Payer: Medicare Other | Admitting: Oncology

## 2021-02-17 ENCOUNTER — Inpatient Hospital Stay: Payer: Medicare Other | Attending: Oncology

## 2021-02-17 ENCOUNTER — Inpatient Hospital Stay: Payer: Medicare Other

## 2021-02-17 ENCOUNTER — Other Ambulatory Visit: Payer: Self-pay

## 2021-02-17 VITALS — BP 120/55 | HR 65 | Temp 97.2°F | Resp 16 | Wt 201.9 lb

## 2021-02-17 VITALS — BP 127/83 | HR 71

## 2021-02-17 DIAGNOSIS — Z5111 Encounter for antineoplastic chemotherapy: Secondary | ICD-10-CM | POA: Diagnosis present

## 2021-02-17 DIAGNOSIS — Z87891 Personal history of nicotine dependence: Secondary | ICD-10-CM | POA: Insufficient documentation

## 2021-02-17 DIAGNOSIS — Z5112 Encounter for antineoplastic immunotherapy: Secondary | ICD-10-CM | POA: Insufficient documentation

## 2021-02-17 DIAGNOSIS — Z79899 Other long term (current) drug therapy: Secondary | ICD-10-CM | POA: Diagnosis not present

## 2021-02-17 DIAGNOSIS — C321 Malignant neoplasm of supraglottis: Secondary | ICD-10-CM

## 2021-02-17 DIAGNOSIS — D649 Anemia, unspecified: Secondary | ICD-10-CM | POA: Insufficient documentation

## 2021-02-17 DIAGNOSIS — Z7901 Long term (current) use of anticoagulants: Secondary | ICD-10-CM | POA: Diagnosis not present

## 2021-02-17 DIAGNOSIS — C099 Malignant neoplasm of tonsil, unspecified: Secondary | ICD-10-CM | POA: Insufficient documentation

## 2021-02-17 DIAGNOSIS — Z930 Tracheostomy status: Secondary | ICD-10-CM | POA: Diagnosis not present

## 2021-02-17 DIAGNOSIS — N189 Chronic kidney disease, unspecified: Secondary | ICD-10-CM | POA: Diagnosis not present

## 2021-02-17 DIAGNOSIS — I2699 Other pulmonary embolism without acute cor pulmonale: Secondary | ICD-10-CM | POA: Insufficient documentation

## 2021-02-17 LAB — COMPREHENSIVE METABOLIC PANEL
ALT: 16 U/L (ref 0–44)
AST: 15 U/L (ref 15–41)
Albumin: 3.7 g/dL (ref 3.5–5.0)
Alkaline Phosphatase: 38 U/L (ref 38–126)
Anion gap: 7 (ref 5–15)
BUN: 22 mg/dL (ref 8–23)
CO2: 25 mmol/L (ref 22–32)
Calcium: 8.6 mg/dL — ABNORMAL LOW (ref 8.9–10.3)
Chloride: 105 mmol/L (ref 98–111)
Creatinine, Ser: 1.48 mg/dL — ABNORMAL HIGH (ref 0.61–1.24)
GFR, Estimated: 52 mL/min — ABNORMAL LOW (ref >60)
Glucose, Bld: 100 mg/dL — ABNORMAL HIGH (ref 70–99)
Potassium: 3.8 mmol/L (ref 3.5–5.1)
Sodium: 137 mmol/L (ref 135–145)
Total Bilirubin: 0.4 mg/dL (ref 0.3–1.2)
Total Protein: 6.9 g/dL (ref 6.5–8.1)

## 2021-02-17 LAB — CBC WITH DIFFERENTIAL/PLATELET
Abs Immature Granulocytes: 0.06 10*3/uL (ref 0.00–0.07)
Basophils Absolute: 0 10*3/uL (ref 0.0–0.1)
Basophils Relative: 0 %
Eosinophils Absolute: 0.1 10*3/uL (ref 0.0–0.5)
Eosinophils Relative: 2 %
HCT: 30 % — ABNORMAL LOW (ref 39.0–52.0)
Hemoglobin: 9.7 g/dL — ABNORMAL LOW (ref 13.0–17.0)
Immature Granulocytes: 1 %
Lymphocytes Relative: 23 %
Lymphs Abs: 1.1 10*3/uL (ref 0.7–4.0)
MCH: 32.6 pg (ref 26.0–34.0)
MCHC: 32.3 g/dL (ref 30.0–36.0)
MCV: 100.7 fL — ABNORMAL HIGH (ref 80.0–100.0)
Monocytes Absolute: 0.4 10*3/uL (ref 0.1–1.0)
Monocytes Relative: 8 %
Neutro Abs: 3.2 10*3/uL (ref 1.7–7.7)
Neutrophils Relative %: 66 %
Platelets: 208 10*3/uL (ref 150–400)
RBC: 2.98 MIL/uL — ABNORMAL LOW (ref 4.22–5.81)
RDW: 14.7 % (ref 11.5–15.5)
WBC: 4.8 10*3/uL (ref 4.0–10.5)
nRBC: 0 % (ref 0.0–0.2)

## 2021-02-17 LAB — TSH: TSH: 2.492 u[IU]/mL (ref 0.350–4.500)

## 2021-02-17 MED ORDER — HEPARIN SOD (PORK) LOCK FLUSH 100 UNIT/ML IV SOLN
500.0000 [IU] | Freq: Once | INTRAVENOUS | Status: DC
  Filled 2021-02-17: qty 5

## 2021-02-17 MED ORDER — SODIUM CHLORIDE 0.9 % IV SOLN
181.0000 mg | Freq: Once | INTRAVENOUS | Status: AC
  Administered 2021-02-17: 180 mg via INTRAVENOUS
  Filled 2021-02-17: qty 18

## 2021-02-17 MED ORDER — DIPHENHYDRAMINE HCL 50 MG/ML IJ SOLN
25.0000 mg | Freq: Once | INTRAMUSCULAR | Status: AC
  Administered 2021-02-17: 25 mg via INTRAVENOUS
  Filled 2021-02-17: qty 1

## 2021-02-17 MED ORDER — FAMOTIDINE 20 MG IN NS 100 ML IVPB
20.0000 mg | Freq: Once | INTRAVENOUS | Status: AC
  Administered 2021-02-17: 20 mg via INTRAVENOUS
  Filled 2021-02-17: qty 20

## 2021-02-17 MED ORDER — SODIUM CHLORIDE 0.9 % IV SOLN
80.0000 mg/m2 | Freq: Once | INTRAVENOUS | Status: AC
  Administered 2021-02-17: 168 mg via INTRAVENOUS
  Filled 2021-02-17: qty 28

## 2021-02-17 MED ORDER — PALONOSETRON HCL INJECTION 0.25 MG/5ML
0.2500 mg | Freq: Once | INTRAVENOUS | Status: AC
  Administered 2021-02-17: 0.25 mg via INTRAVENOUS
  Filled 2021-02-17: qty 5

## 2021-02-17 MED ORDER — SODIUM CHLORIDE 0.9 % IV SOLN
Freq: Once | INTRAVENOUS | Status: AC
  Filled 2021-02-17: qty 250

## 2021-02-17 MED ORDER — SODIUM CHLORIDE 0.9 % IV SOLN
10.0000 mg | Freq: Once | INTRAVENOUS | Status: AC
  Administered 2021-02-17: 10 mg via INTRAVENOUS
  Filled 2021-02-17: qty 10

## 2021-02-17 MED ORDER — HEPARIN SOD (PORK) LOCK FLUSH 100 UNIT/ML IV SOLN
500.0000 [IU] | Freq: Once | INTRAVENOUS | Status: AC | PRN
  Administered 2021-02-17: 500 [IU]
  Filled 2021-02-17: qty 5

## 2021-02-17 MED ORDER — SODIUM CHLORIDE 0.9% FLUSH
10.0000 mL | INTRAVENOUS | Status: DC | PRN
  Administered 2021-02-17: 10 mL via INTRAVENOUS
  Filled 2021-02-17: qty 10

## 2021-02-17 MED ORDER — SODIUM CHLORIDE 0.9 % IV SOLN
150.0000 mg | Freq: Once | INTRAVENOUS | Status: AC
  Administered 2021-02-17: 150 mg via INTRAVENOUS
  Filled 2021-02-17: qty 150

## 2021-02-17 NOTE — Patient Instructions (Signed)
CANCER CENTER [place] [facility]COLOG[facility]  Discharge Instructions: Thank you for choosing [facility] to provide your oncology and hematology care.  If you have a lab appointment with the Cancer Center, please go directly to the Cancer Center and check in at the registration area.  Wear comfortable clothing and clothing appropriate for easy access to any Portacath or PICC line.   We strive to give you quality time with your provider. You may need to reschedule your appointment if you arrive late (15 or more minutes).  Arriving late affects you and other patients whose appointments are after yours.  Also, if you miss three or more appointments without notifying the office, you may be dismissed from the clinic at the provider's discretion.      For prescription refill requests, have your pharmacy contact our office and allow 72 hours for refills to be completed.    Today you received the following chemotherapy and/or immunotherapy agents: Taxol, Carboplatin      To help prevent nausea and vomiting after your treatment, we encourage you to take your nausea medication as directed.  BELOW ARE SYMPTOMS THAT SHOULD BE REPORTED IMMEDIATELY: *FEVER GREATER THAN 100.4 F (38 C) OR HIGHER *CHILLS OR SWEATING *NAUSEA AND VOMITING THAT IS NOT CONTROLLED WITH YOUR NAUSEA MEDICATION *UNUSUAL SHORTNESS OF BREATH *UNUSUAL BRUISING OR BLEEDING *URINARY PROBLEMS (pain or burning when urinating, or frequent urination) *BOWEL PROBLEMS (unusual diarrhea, constipation, pain near the anus) TENDERNESS IN MOUTH AND THROAT WITH OR WITHOUT PRESENCE OF ULCERS (sore throat, sores in mouth, or a toothache) UNUSUAL RASH, SWELLING OR PAIN  UNUSUAL VAGINAL DISCHARGE OR ITCHING   Items with * indicate a potential emergency and should be followed up as soon as possible or go to the Emergency Department if any problems should occur.  Please show the CHEMOTHERAPY ALERT CARD or IMMUNOTHERAPY ALERT CARD at  check-in to the Emergency Department and triage nurse.  Should you have questions after your visit or need to cancel or reschedule your appointment, please contact CANCER CENTER [place] [facility]  [phone] and follow the prompts.  Office hours are 8:00 a.m. to 4:30 p.m. Monday - Friday. Please note that voicemails left after 4:00 p.m. may not be returned until the following business day.  We are closed weekends and major holidays. You have access to a nurse at all times for urgent questions. Please call the main number to the clinic [phone] and follow the prompts.  For any non-urgent questions, you may also contact your provider using MyChart. We now offer e-Visits for anyone 18 and older to request care online for non-urgent symptoms. For details visit [email or web address].   Also download the MyChart app! Go to the app store, search "MyChart", open the app, select [place], and log in with your MyChart username and password.  Due to Covid, a mask is required upon entering the hospital/clinic. If you do not have a mask, one will be given to you upon arrival. For doctor visits, patients may have 1 support person aged 18 or older with them. For treatment visits, patients cannot have anyone with them due to current Covid guidelines and our immunocompromised population.  

## 2021-02-17 NOTE — Progress Notes (Signed)
Pt has no concerns at this time. 

## 2021-02-18 DIAGNOSIS — Z79899 Other long term (current) drug therapy: Secondary | ICD-10-CM | POA: Diagnosis not present

## 2021-02-18 DIAGNOSIS — C099 Malignant neoplasm of tonsil, unspecified: Secondary | ICD-10-CM | POA: Diagnosis not present

## 2021-02-18 DIAGNOSIS — N189 Chronic kidney disease, unspecified: Secondary | ICD-10-CM | POA: Diagnosis not present

## 2021-02-18 DIAGNOSIS — Z5111 Encounter for antineoplastic chemotherapy: Secondary | ICD-10-CM | POA: Diagnosis present

## 2021-02-18 DIAGNOSIS — D649 Anemia, unspecified: Secondary | ICD-10-CM | POA: Diagnosis not present

## 2021-02-18 DIAGNOSIS — C321 Malignant neoplasm of supraglottis: Secondary | ICD-10-CM | POA: Diagnosis present

## 2021-02-18 DIAGNOSIS — Z7901 Long term (current) use of anticoagulants: Secondary | ICD-10-CM | POA: Diagnosis not present

## 2021-02-18 DIAGNOSIS — Z87891 Personal history of nicotine dependence: Secondary | ICD-10-CM | POA: Diagnosis not present

## 2021-02-18 DIAGNOSIS — Z5112 Encounter for antineoplastic immunotherapy: Secondary | ICD-10-CM | POA: Diagnosis present

## 2021-02-18 DIAGNOSIS — Z930 Tracheostomy status: Secondary | ICD-10-CM | POA: Diagnosis not present

## 2021-02-18 DIAGNOSIS — I2699 Other pulmonary embolism without acute cor pulmonale: Secondary | ICD-10-CM | POA: Diagnosis not present

## 2021-02-18 LAB — T4: T4, Total: 7.1 ug/dL (ref 4.5–12.0)

## 2021-02-24 ENCOUNTER — Encounter: Payer: Self-pay | Admitting: Oncology

## 2021-02-26 NOTE — Progress Notes (Signed)
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  INTERVAL HISTORY: Patient returns to clinic today for further evaluation and consideration of cycle 4, day 1 of carboplatinum, Taxol, and Keytruda.  He currently feels well and is asymptomatic.  He continues to tolerate his treatments well without significant side effects.  He does not complain of pain today.  He has no neurologic complaints.  He denies any recent fevers or illnesses. He has no chest pain, shortness of breath, cough, or hemoptysis.  He denies any nausea, vomiting, constipation, or diarrhea.  He has no urinary complaints.  Patient offers no specific complaints today.  REVIEW OF SYSTEMS:   Review of Systems  Constitutional: Negative.  Negative for fever, malaise/fatigue and weight loss.  Respiratory: Negative.  Negative for cough, hemoptysis and shortness of breath.   Cardiovascular: Negative.  Negative for chest pain and leg swelling.  Gastrointestinal: Negative.  Negative for abdominal pain.  Genitourinary: Negative.  Negative for dysuria.  Musculoskeletal: Negative.  Negative for neck pain.  Skin: Negative.  Negative for rash.  Neurological: Negative.  Negative for dizziness, focal weakness, weakness and headaches.  Psychiatric/Behavioral: Negative.  The patient is not nervous/anxious.    As per HPI. Otherwise, a complete review of systems is negative.  PAST MEDICAL HISTORY: Past Medical History:  Diagnosis Date   Cancer [facility])     Coronary artery disease    Hypertension     PAST SURGICAL HISTORY: Past Surgical History:  Procedure Laterality Date   CORONARY ARTERY BYPASS GRAFT  2010   Quadruple   DIRECT LARYNGOSCOPY  11/28/2020   Procedure: DIRECT LARYNGOSCOPY WITH BIOPSY;  Surgeon: Beverly Gust, MD;  Location: [facility] ORS;  Service: ENT;;   IR IMAGING GUIDED PORT INSERTION  01/01/2021   PULMONARY THROMBECTOMY Bilateral 01/13/2021   Procedure: PULMONARY THROMBECTOMY;  Surgeon: Katha Cabal, MD;  Location: [facility];  Service: Cardiovascular;  Laterality: Bilateral;   TRACHEOSTOMY TUBE PLACEMENT N/A 11/28/2020   Procedure: AWAKE TRACHEOSTOMY;  Surgeon: Beverly Gust, MD;  Location: [facility] ORS;  Service: ENT;  Laterality: N/A;    FAMILY HISTORY: Family History  Problem Relation Age of Onset   Arthritis Mother    Heart attack Father    Brain cancer Sister     ADVANCED DIRECTIVES (Y/N):  N  HEALTH MAINTENANCE: Social History   Tobacco Use   Smoking status: Former    Types: Cigarettes    Quit date: 10/17/2008    Years since quitting: 12.3   Smokeless tobacco: Never  Vaping Use   Vaping Use: Never used  Substance Use Topics   Alcohol use: Yes    Comment: occasionally   Drug use: Never     Colonoscopy:  PAP:  Bone density:  Lipid panel:  No Known Allergies  Current Outpatient Medications  Medication Sig Dispense Refill   ALPRAZolam (XANAX) 0.5 MG tablet Take 1 tablet (0.5 mg total) by mouth daily as needed for anxiety (Take 1 hour prior to radiation). 30 tablet 1   apixaban (ELIQUIS) 5 MG TABS tablet Take 1 tablet (5 mg total)  by mouth 2 (two) times daily. 180 tablet 1   feeding supplement (ENSURE ENLIVE / ENSURE PLUS) LIQD Take 237 mLs by mouth 3 (three) times daily between meals. 21330 mL 0   fluticasone (FLONASE) 50 MCG/ACT nasal spray Place 1 spray into both nostrils daily. 15.8 mL 0   hydrALAZINE (APRESOLINE) 50 MG tablet Take 50 mg by mouth 2 (two) times daily.      ipratropium-albuterol (DUONEB) 0.5-2.5 (3) MG/3ML SOLN Take 3 mLs by nebulization every 6 (six) hours. 360 mL 0   ondansetron (ZOFRAN) 8 MG tablet Take 1 tablet (8 mg total) by mouth every 8 (eight) hours as needed for nausea or vomiting. 60 tablet 2   prochlorperazine (COMPAZINE) 10 MG tablet Take 1 tablet (10 mg total) by mouth every 6 (six) hours as needed for nausea or vomiting. 60 tablet 2   rosuvastatin (CRESTOR) 40 MG tablet Take 40 mg by mouth daily.     ascorbic acid (VITAMIN C) 500 MG tablet Take by mouth. (Patient not taking: No sig reported)     cyanocobalamin 1000 MCG tablet Take by mouth. (Patient not taking: No sig reported)     DENTA 5000 PLUS 1.1 % CREA dental cream BRUSH WITH PASTE 2 3 TIMES PER DAY FOR AT LEAST 1 MINUTE (Patient not taking: No sig reported)     No current facility-administered medications for this visit.   Facility-Administered Medications Ordered in Other Visits  Medication Dose Route Frequency Provider Last Rate Last Admin   CARBOplatin (PARAPLATIN) 190 mg in sodium chloride 0.9 % 100 mL chemo infusion  190 mg Intravenous Once Lloyd Huger, MD       heparin lock flush 100 UNIT/ML injection            heparin lock flush 100 unit/mL  500 Units Intravenous Once Lloyd Huger, MD       PACLitaxel (TAXOL) 168 mg in sodium chloride 0.9 % 250 mL chemo infusion (</= 80mg /m2)  80 mg/m2 (Treatment Plan Recorded) Intravenous Once Lloyd Huger, MD 278 mL/hr at 03/03/21 1227 168 mg at 03/03/21 1227   sodium chloride flush (NS) 0.9 % injection 10 mL  10 mL Intravenous PRN Lloyd Huger, MD   10 mL at 01/06/21 1305    OBJECTIVE: Vitals:   03/03/21 1007  BP: 108/67  Pulse: 74  Resp: 18  Temp: (!) 97.2 F (36.2 C)  SpO2: 99%      Body mass index is 30.89 kg/m.    ECOG FS:0 - Asymptomatic  General: Well-developed, well-nourished, no acute distress. Eyes: Pink conjunctiva, anicteric sclera. HEENT: Normocephalic, moist mucous membranes.   Trach tube in place. Lungs: No audible wheezing or coughing. Heart: Regular rate and rhythm. Abdomen: Soft, nontender, no obvious distention. Musculoskeletal: No edema, cyanosis, or clubbing. Neuro: Alert, answering all questions appropriately. Cranial nerves grossly intact. Skin: No rashes or petechiae noted. Psych: Normal affect.   LAB RESULTS:  Lab Results  Component Value Date   NA 137 03/03/2021   K 3.3 (L) 03/03/2021   CL 101 03/03/2021   CO2 24 03/03/2021   GLUCOSE 168 (H) 03/03/2021   BUN 19 03/03/2021   CREATININE 1.34 (H) 03/03/2021   CALCIUM 8.7 (L) 03/03/2021   PROT 7.2 03/03/2021   ALBUMIN 3.7 03/03/2021   AST 18 03/03/2021   ALT 12 03/03/2021   ALKPHOS 39 03/03/2021   BILITOT 0.4 03/03/2021   GFRNONAA 59 (L) 03/03/2021   GFRAA 37 (L) 11/13/2019    Lab Results  Component Value Date   WBC 2.5 (L) 03/03/2021   NEUTROABS 1.3 (L) 03/03/2021   HGB 9.9 (L) 03/03/2021   HCT 30.4 (L) 03/03/2021   MCV 99.0 03/03/2021   PLT 202 03/03/2021     STUDIES: No results found.  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   PLAN:    1.  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  Both malignancies are p16 positive.  Patient has had an emergency trach placed secondary to airway protection.  PET scan results from December 16, 2020 reviewed independently with 3 metastatic lesions in patient's lung.  Case discussed with surgery and radiation oncology and it was determined to proceed with systemic chemotherapy using carboplatinum, Taxol, and Keytruda on day 1 with carboplatinum and Taxol only on day 8.  This will be a 21-day cycle.  Plan to do 4 cycles and then reimage followed by maintenance Keytruda.  Patient has now had port placement.  Proceed with cycle 4, day 1 of treatment today.   Return to clinic in 1 week for further evaluation and consideration of cycle 4, day 8.    2.  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  PET scan results from November 12, 2019 reviewed independently with no obvious evidence of recurrent or progressive disease.  Level 2 lymph node is no longer hypermetabolic.  3.  Renal insufficiency: Creatinine has improved to 1.34.  Monitor. 4.  Anemia: Chronic and unchanged.  Patient's hemoglobin is 9.9 today. 5.  Hypokalemia: Mild, monitor. 6.  Chemotherapy extravasation: Patient now has a port.  Patient has skin discoloration, but no obvious infection. 7.  Pulmonary embolism: Diagnosed in September 2022.  Continue Eliquis as prescribed.  Patient will require a minimum of 6 months of treatment. 8.  Leukopenia: ANC has decreased to 1.3, monitor.  Proceed with treatment as above.  Patient expressed understanding and was in agreement with this plan. He also understands that He can call clinic at any time with any questions, concerns, or complaints.   Cancer Staging Malignant neoplasm of supraglottis [facility]) Staging form: Larynx - Supraglottis, AJCC 8th Edition - Clinical stage from 12/11/2020: Stage IVC (cT3, cN2b, cM1) - Signed by Lloyd Huger, MD on 01/26/2021  Primary squamous cell carcinoma of tonsil ([place]) Staging form: Pharynx - HPV-Mediated Oropharynx, AJCC 8th Edition - Clinical: No stage assigned - Unsigned   Lloyd Huger, MD   03/03/2021 1:26 PM

## 2021-03-03 ENCOUNTER — Inpatient Hospital Stay: Payer: Medicare Other

## 2021-03-03 ENCOUNTER — Inpatient Hospital Stay (HOSPITAL_BASED_OUTPATIENT_CLINIC_OR_DEPARTMENT_OTHER): Payer: Medicare Other | Admitting: Oncology

## 2021-03-03 ENCOUNTER — Encounter: Payer: Self-pay | Admitting: Oncology

## 2021-03-03 ENCOUNTER — Other Ambulatory Visit: Payer: Self-pay

## 2021-03-03 VITALS — BP 108/67 | HR 74 | Temp 97.2°F | Resp 18 | Wt 209.2 lb

## 2021-03-03 DIAGNOSIS — C321 Malignant neoplasm of supraglottis: Secondary | ICD-10-CM | POA: Diagnosis not present

## 2021-03-03 DIAGNOSIS — Z5112 Encounter for antineoplastic immunotherapy: Secondary | ICD-10-CM | POA: Diagnosis not present

## 2021-03-03 LAB — TSH: TSH: 2.48 u[IU]/mL (ref 0.350–4.500)

## 2021-03-03 LAB — CBC WITH DIFFERENTIAL/PLATELET
Abs Immature Granulocytes: 0.02 10*3/uL (ref 0.00–0.07)
Basophils Absolute: 0 10*3/uL (ref 0.0–0.1)
Basophils Relative: 0 %
Eosinophils Absolute: 0.1 10*3/uL (ref 0.0–0.5)
Eosinophils Relative: 2 %
HCT: 30.4 % — ABNORMAL LOW (ref 39.0–52.0)
Hemoglobin: 9.9 g/dL — ABNORMAL LOW (ref 13.0–17.0)
Immature Granulocytes: 1 %
Lymphocytes Relative: 27 %
Lymphs Abs: 0.7 10*3/uL (ref 0.7–4.0)
MCH: 32.2 pg (ref 26.0–34.0)
MCHC: 32.6 g/dL (ref 30.0–36.0)
MCV: 99 fL (ref 80.0–100.0)
Monocytes Absolute: 0.4 10*3/uL (ref 0.1–1.0)
Monocytes Relative: 18 %
Neutro Abs: 1.3 10*3/uL — ABNORMAL LOW (ref 1.7–7.7)
Neutrophils Relative %: 52 %
Platelets: 202 10*3/uL (ref 150–400)
RBC: 3.07 MIL/uL — ABNORMAL LOW (ref 4.22–5.81)
RDW: 15.8 % — ABNORMAL HIGH (ref 11.5–15.5)
WBC: 2.5 10*3/uL — ABNORMAL LOW (ref 4.0–10.5)
nRBC: 0 % (ref 0.0–0.2)

## 2021-03-03 LAB — COMPREHENSIVE METABOLIC PANEL
ALT: 12 U/L (ref 0–44)
AST: 18 U/L (ref 15–41)
Albumin: 3.7 g/dL (ref 3.5–5.0)
Alkaline Phosphatase: 39 U/L (ref 38–126)
Anion gap: 12 (ref 5–15)
BUN: 19 mg/dL (ref 8–23)
CO2: 24 mmol/L (ref 22–32)
Calcium: 8.7 mg/dL — ABNORMAL LOW (ref 8.9–10.3)
Chloride: 101 mmol/L (ref 98–111)
Creatinine, Ser: 1.34 mg/dL — ABNORMAL HIGH (ref 0.61–1.24)
GFR, Estimated: 59 mL/min — ABNORMAL LOW (ref >60)
Glucose, Bld: 168 mg/dL — ABNORMAL HIGH (ref 70–99)
Potassium: 3.3 mmol/L — ABNORMAL LOW (ref 3.5–5.1)
Sodium: 137 mmol/L (ref 135–145)
Total Bilirubin: 0.4 mg/dL (ref 0.3–1.2)
Total Protein: 7.2 g/dL (ref 6.5–8.1)

## 2021-03-03 MED ORDER — SODIUM CHLORIDE 0.9 % IV SOLN
Freq: Once | INTRAVENOUS | Status: AC
  Filled 2021-03-03: qty 250

## 2021-03-03 MED ORDER — SODIUM CHLORIDE 0.9 % IV SOLN
80.0000 mg/m2 | Freq: Once | INTRAVENOUS | Status: AC
  Administered 2021-03-03: 168 mg via INTRAVENOUS
  Filled 2021-03-03: qty 28

## 2021-03-03 MED ORDER — DIPHENHYDRAMINE HCL 50 MG/ML IJ SOLN
25.0000 mg | Freq: Once | INTRAMUSCULAR | Status: AC
Start: 2021-03-03 — End: 2021-03-03
  Administered 2021-03-03: 25 mg via INTRAVENOUS
  Filled 2021-03-03: qty 1

## 2021-03-03 MED ORDER — HEPARIN SOD (PORK) LOCK FLUSH 100 UNIT/ML IV SOLN
500.0000 [IU] | Freq: Once | INTRAVENOUS | Status: AC | PRN
  Administered 2021-03-03: 500 [IU]
  Filled 2021-03-03: qty 5

## 2021-03-03 MED ORDER — PALONOSETRON HCL INJECTION 0.25 MG/5ML
0.2500 mg | Freq: Once | INTRAVENOUS | Status: AC
  Administered 2021-03-03: 0.25 mg via INTRAVENOUS
  Filled 2021-03-03: qty 5

## 2021-03-03 MED ORDER — FAMOTIDINE 20 MG IN NS 100 ML IVPB
20.0000 mg | Freq: Once | INTRAVENOUS | Status: AC
  Administered 2021-03-03: 20 mg via INTRAVENOUS
  Filled 2021-03-03: qty 20

## 2021-03-03 MED ORDER — SODIUM CHLORIDE 0.9 % IV SOLN
194.8000 mg | Freq: Once | INTRAVENOUS | Status: AC
  Administered 2021-03-03: 190 mg via INTRAVENOUS
  Filled 2021-03-03: qty 19

## 2021-03-03 MED ORDER — SODIUM CHLORIDE 0.9 % IV SOLN
200.0000 mg | Freq: Once | INTRAVENOUS | Status: AC
  Administered 2021-03-03: 200 mg via INTRAVENOUS
  Filled 2021-03-03: qty 8

## 2021-03-03 MED ORDER — SODIUM CHLORIDE 0.9 % IV SOLN
150.0000 mg | Freq: Once | INTRAVENOUS | Status: AC
  Administered 2021-03-03: 150 mg via INTRAVENOUS
  Filled 2021-03-03: qty 150

## 2021-03-03 MED ORDER — SODIUM CHLORIDE 0.9 % IV SOLN
10.0000 mg | Freq: Once | INTRAVENOUS | Status: AC
  Administered 2021-03-03: 10 mg via INTRAVENOUS
  Filled 2021-03-03: qty 10

## 2021-03-03 NOTE — Progress Notes (Signed)
Pt reports trach been irritating him. Cough x2wks. Denies fever.

## 2021-03-03 NOTE — Progress Notes (Signed)
Nutrition Follow-up:  Patient with history of stage IV squamous cell carcinoma of let tonsil, p 16+ now with second primary in supraglottis stage IV, p 16+.  S/p emergent trach on 8/12.  Patient receiving Norma Fredrickson, taxol, [place].    Met with patient during infusion.  Patient reports that he is eating more and more variety of foods.  Has been eating fish, shrimp, chicken, hamburger, steak, sub sandwich without difficulty.  Reports that he is taking it slow and chewing well.      Medications: reviewed  Labs: reviewed  Anthropometrics:   Weight 209 lb 3.2 oz today 203 lb on 10/25 201 lb 6.4 oz on 10/11 198 lb on 10/4 205 lb on 9/20 205 lb on 9/13 217 lb on 05/20/20   NUTRITION DIAGNOSIS: Inadequate oral intake improving   INTERVENTION:  Patient to continue to eat high calorie, high protein foods for weight maintenance    MONITORING, EVALUATION, GOAL: weight trends, intake   NEXT VISIT: Tuesday, Dec 6 during infusion  Larrisa Cravey B. Zenia Resides, [place], [place] Registered Dietitian [phone] (mobile)

## 2021-03-03 NOTE — Patient Instructions (Signed)
[facility] ONCOLOGY  Discharge Instructions: Thank you for choosing [facility] to provide your oncology and hematology care.  If you have a lab appointment with the [facility], please go directly to the [facility] and check in at the registration area.  Wear comfortable clothing and clothing appropriate for easy access to any Portacath or PICC line.   We strive to give you quality time with your provider. You may need to reschedule your appointment if you arrive late (15 or more minutes).  Arriving late affects you and other patients whose appointments are after yours.  Also, if you miss three or more appointments without notifying the office, you may be dismissed from the clinic at the provider's discretion.      For prescription refill requests, have your pharmacy contact our office and allow 72 hours for refills to be completed.    Today you received the following chemotherapy and/or immunotherapy agents : Keytuda / Taxol / Carboplatin   To help prevent nausea and vomiting after your treatment, we encourage you to take your nausea medication as directed.  BELOW ARE SYMPTOMS THAT SHOULD BE REPORTED IMMEDIATELY: *FEVER GREATER THAN 100.4 F (38 C) OR HIGHER *CHILLS OR SWEATING *NAUSEA AND VOMITING THAT IS NOT CONTROLLED WITH YOUR NAUSEA MEDICATION *UNUSUAL SHORTNESS OF BREATH *UNUSUAL BRUISING OR BLEEDING *URINARY PROBLEMS (pain or burning when urinating, or frequent urination) *BOWEL PROBLEMS (unusual diarrhea, constipation, pain near the anus) TENDERNESS IN MOUTH AND THROAT WITH OR WITHOUT PRESENCE OF ULCERS (sore throat, sores in mouth, or a toothache) UNUSUAL RASH, SWELLING OR PAIN  UNUSUAL VAGINAL DISCHARGE OR ITCHING   Items with * indicate a potential emergency and should be followed up as soon as possible or go to the Emergency Department if any problems should occur.  Please show the CHEMOTHERAPY ALERT CARD or IMMUNOTHERAPY ALERT  CARD at check-in to the Emergency Department and triage nurse.  Should you have questions after your visit or need to cancel or reschedule your appointment, please contact [facility]  [phone] and follow the prompts.  Office hours are 8:00 a.m. to 4:30 p.m. Monday - Friday. Please note that voicemails left after 4:00 p.m. may not be returned until the following business day.  We are closed weekends and major holidays. You have access to a nurse at all times for urgent questions. Please call the main number to the clinic [phone] and follow the prompts.  For any non-urgent questions, you may also contact your provider using MyChart. We now offer e-Visits for anyone 28 and older to request care online for non-urgent symptoms. For details visit [email or web address].   Also download the MyChart app! Go to the app store, search "MyChart", open the app, select [facility], and log in with your MyChart username and password.  Due to Covid, a mask is required upon entering the hospital/clinic. If you do not have a mask, one will be given to you upon arrival. For doctor visits, patients may have 1 support person aged 56 or older with them. For treatment visits, patients cannot have anyone with them due to current Covid guidelines and our immunocompromised population.

## 2021-03-04 LAB — T4: T4, Total: 6.4 ug/dL (ref 4.5–12.0)

## 2021-03-09 NOTE — Progress Notes (Signed)
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  INTERVAL HISTORY: Patient returns to clinic today for further evaluation and consideration of cycle 4, day 8 of carboplatinum, Taxol, and Keytruda.  Carboplatinum and Taxol only today.  He continues to feel well and remains asymptomatic.  He is tolerating his treatments well without significant side effects.  He does not complain of pain today.  He has no neurologic complaints.  He denies any recent fevers or illnesses. He has no chest pain, shortness of breath, cough, or hemoptysis.  He denies any nausea, vomiting, constipation, or diarrhea.  He has no urinary complaints.  Patient offers no specific complaints today.  REVIEW OF SYSTEMS:   Review of Systems  Constitutional: Negative.  Negative for fever, malaise/fatigue and weight loss.  Respiratory: Negative.  Negative for cough, hemoptysis and shortness of breath.   Cardiovascular: Negative.  Negative for chest pain and leg swelling.  Gastrointestinal: Negative.  Negative for abdominal pain.  Genitourinary: Negative.  Negative for dysuria.  Musculoskeletal: Negative.  Negative for neck pain.  Skin: Negative.  Negative for rash.  Neurological: Negative.  Negative for dizziness, focal weakness, weakness and headaches.  Psychiatric/Behavioral: Negative.  The patient is not nervous/anxious.    As per HPI. Otherwise, a complete review of systems is negative.  PAST MEDICAL HISTORY: Past Medical History:   Diagnosis Date   Cancer [facility])    Coronary artery disease    Hypertension     PAST SURGICAL HISTORY: Past Surgical History:  Procedure Laterality Date   CORONARY ARTERY BYPASS GRAFT  2010   Quadruple   DIRECT LARYNGOSCOPY  11/28/2020   Procedure: DIRECT LARYNGOSCOPY WITH BIOPSY;  Surgeon: Beverly Gust, MD;  Location: [facility] ORS;  Service: ENT;;   IR IMAGING GUIDED PORT INSERTION  01/01/2021   PULMONARY THROMBECTOMY Bilateral 01/13/2021   Procedure: PULMONARY THROMBECTOMY;  Surgeon: Katha Cabal, MD;  Location: [facility];  Service: Cardiovascular;  Laterality: Bilateral;   TRACHEOSTOMY TUBE PLACEMENT N/A 11/28/2020   Procedure: AWAKE TRACHEOSTOMY;  Surgeon: Beverly Gust, MD;  Location: [facility] ORS;  Service: ENT;  Laterality: N/A;    FAMILY HISTORY: Family History  Problem Relation Age of Onset   Arthritis Mother    Heart attack Father    Brain cancer Sister     ADVANCED DIRECTIVES (Y/N):  N  HEALTH MAINTENANCE: Social History   Tobacco Use   Smoking status: Former    Types: Cigarettes    Quit date: 10/17/2008    Years since quitting: 12.4   Smokeless tobacco: Never  Vaping Use   Vaping Use: Never used  Substance Use Topics   Alcohol use: Yes    Comment: occasionally   Drug use: Never     Colonoscopy:  PAP:  Bone density:  Lipid panel:  No Known Allergies  Current Outpatient Medications  Medication Sig Dispense Refill   ALPRAZolam (XANAX) 0.5 MG tablet Take 1 tablet (0.5 mg total) by mouth daily as needed for anxiety (Take 1 hour prior to radiation). 30 tablet 1   apixaban (ELIQUIS) 5 MG TABS tablet  Take 1 tablet (5 mg total) by mouth 2 (two) times daily. 180 tablet 1   feeding supplement (ENSURE ENLIVE / ENSURE PLUS) LIQD Take 237 mLs by mouth 3 (three) times daily between meals. 21330 mL 0   fluticasone (FLONASE) 50 MCG/ACT nasal spray Place 1 spray into both nostrils daily. 15.8 mL 0   hydrALAZINE (APRESOLINE) 50 MG tablet Take 50 mg by  mouth 2 (two) times daily.     ipratropium-albuterol (DUONEB) 0.5-2.5 (3) MG/3ML SOLN Take 3 mLs by nebulization every 6 (six) hours. 360 mL 0   ondansetron (ZOFRAN) 8 MG tablet Take 1 tablet (8 mg total) by mouth every 8 (eight) hours as needed for nausea or vomiting. 60 tablet 2   prochlorperazine (COMPAZINE) 10 MG tablet Take 1 tablet (10 mg total) by mouth every 6 (six) hours as needed for nausea or vomiting. 60 tablet 2   rosuvastatin (CRESTOR) 40 MG tablet Take 40 mg by mouth daily.     ascorbic acid (VITAMIN C) 500 MG tablet Take by mouth. (Patient not taking: Reported on 01/27/2021)     cyanocobalamin 1000 MCG tablet Take by mouth. (Patient not taking: Reported on 01/27/2021)     DENTA 5000 PLUS 1.1 % CREA dental cream BRUSH WITH PASTE 2 3 TIMES PER DAY FOR AT LEAST 1 MINUTE (Patient not taking: Reported on 01/27/2021)     No current facility-administered medications for this visit.   Facility-Administered Medications Ordered in Other Visits  Medication Dose Route Frequency Provider Last Rate Last Admin   CARBOplatin (PARAPLATIN) 200 mg in sodium chloride 0.9 % 250 mL chemo infusion  200 mg Intravenous Once Lloyd Huger, MD       heparin lock flush 100 UNIT/ML injection            heparin lock flush 100 unit/mL  500 Units Intravenous Once Grayland Ormond, Kathlene November, MD       heparin lock flush 100 unit/mL  500 Units Intracatheter Once PRN Lloyd Huger, MD       PACLitaxel (TAXOL) 168 mg in sodium chloride 0.9 % 250 mL chemo infusion (</= 80mg /m2)  80 mg/m2 (Treatment Plan Recorded) Intravenous Once Lloyd Huger, MD 278 mL/hr at 03/10/21 1124 168 mg at 03/10/21 1124   sodium chloride flush (NS) 0.9 % injection 10 mL  10 mL Intravenous PRN Lloyd Huger, MD   10 mL at 01/06/21 1305    OBJECTIVE: Vitals:   03/10/21 0855  BP: 128/70  Pulse: 67  Resp: 18  Temp: 98.2 F (36.8 C)  SpO2: 98%      Body mass index is 31.17 kg/m.    ECOG FS:0 -  Asymptomatic  General: Well-developed, well-nourished, no acute distress. Eyes: Pink conjunctiva, anicteric sclera. HEENT: Normocephalic, moist mucous membranes.  Trach tube in place. Lungs: No audible wheezing or coughing. Heart: Regular rate and rhythm. Abdomen: Soft, nontender, no obvious distention. Musculoskeletal: No edema, cyanosis, or clubbing. Neuro: Alert, answering all questions appropriately. Cranial nerves grossly intact. Skin: No rashes or petechiae noted. Psych: Normal affect.  LAB RESULTS:  Lab Results  Component Value Date   NA 136 03/10/2021   K 3.8 03/10/2021   CL 106 03/10/2021   CO2 25 03/10/2021   GLUCOSE 99 03/10/2021   BUN 21 03/10/2021   CREATININE 1.29 (H) 03/10/2021   CALCIUM 8.6 (L) 03/10/2021   PROT 7.0 03/10/2021   ALBUMIN 3.7 03/10/2021   AST 17 03/10/2021   ALT 15 03/10/2021   ALKPHOS 38  03/10/2021   BILITOT 0.4 03/10/2021   GFRNONAA >60 03/10/2021   GFRAA 37 (L) 11/13/2019    Lab Results  Component Value Date   WBC 4.1 03/10/2021   NEUTROABS 2.8 03/10/2021   HGB 9.6 (L) 03/10/2021   HCT 29.4 (L) 03/10/2021   MCV 99.3 03/10/2021   PLT 190 03/10/2021     STUDIES: No results found.  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   PLAN:    1.  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  Both malignancies are p16 positive.  Patient has had an emergency trach placed secondary to airway protection.  PET scan results from December 16, 2020 reviewed independently with 3 metastatic lesions in patient's lung.  Case discussed with surgery and radiation oncology and it was determined to proceed with systemic chemotherapy using carboplatinum, Taxol, and Keytruda on day 1 with carboplatinum and Taxol only on day 8.  This will be a 21-day cycle.  Plan to do 4  cycles and then reimage followed by maintenance Keytruda.  Patient has now had port placement.  Proceed with cycle 4, day 8 of treatment today.  Carboplatin and Taxol only.  Patient has restaging PET scan scheduled for March 23, 2021.  Return to clinic in 2 weeks for further evaluation, discussion of his imaging results, and continuation of maintenance Keytruda. 2.  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  PET scan results from November 12, 2019 reviewed independently with no obvious evidence of recurrent or progressive disease.  Level 2 lymph node is no longer hypermetabolic.  3.  Renal insufficiency: Chronic and unchanged.  Patient's creatinine is 1.29 today.   4.  Anemia: Chronic and unchanged.  Patient's hemoglobin is 9.6 today.   5.  Hypokalemia: Resolved. 6.  Pulmonary embolism: Diagnosed in September 2022.  Continue Eliquis as prescribed.  Patient will require a minimum of 6 months of treatment. 7.  Leukopenia: Resolved.  Patient expressed understanding and was in agreement with this plan. He also understands that He can call clinic at any time with any questions, concerns, or complaints.    Cancer Staging  Malignant neoplasm of supraglottis [facility]) Staging form: Larynx - Supraglottis, AJCC 8th Edition - Clinical stage from 12/11/2020: Stage IVC (cT3, cN2b, cM1) - Signed by Lloyd Huger, MD on 01/26/2021  Primary squamous cell carcinoma of tonsil ([place]) Staging form: Pharynx - HPV-Mediated Oropharynx, AJCC 8th Edition - Clinical: No stage assigned - Unsigned   Lloyd Huger, MD   03/10/2021 12:18 PM

## 2021-03-10 ENCOUNTER — Inpatient Hospital Stay: Payer: Medicare Other

## 2021-03-10 ENCOUNTER — Other Ambulatory Visit: Payer: Self-pay

## 2021-03-10 ENCOUNTER — Encounter: Payer: Self-pay | Admitting: Oncology

## 2021-03-10 ENCOUNTER — Inpatient Hospital Stay (HOSPITAL_BASED_OUTPATIENT_CLINIC_OR_DEPARTMENT_OTHER): Payer: Medicare Other | Admitting: Oncology

## 2021-03-10 VITALS — BP 128/70 | HR 67 | Temp 98.2°F | Resp 18 | Wt 211.1 lb

## 2021-03-10 VITALS — BP 125/83 | HR 64 | Temp 97.3°F | Resp 18 | Wt 211.1 lb

## 2021-03-10 DIAGNOSIS — C321 Malignant neoplasm of supraglottis: Secondary | ICD-10-CM

## 2021-03-10 DIAGNOSIS — Z5112 Encounter for antineoplastic immunotherapy: Secondary | ICD-10-CM | POA: Diagnosis not present

## 2021-03-10 LAB — COMPREHENSIVE METABOLIC PANEL
ALT: 15 U/L (ref 0–44)
AST: 17 U/L (ref 15–41)
Albumin: 3.7 g/dL (ref 3.5–5.0)
Alkaline Phosphatase: 38 U/L (ref 38–126)
Anion gap: 5 (ref 5–15)
BUN: 21 mg/dL (ref 8–23)
CO2: 25 mmol/L (ref 22–32)
Calcium: 8.6 mg/dL — ABNORMAL LOW (ref 8.9–10.3)
Chloride: 106 mmol/L (ref 98–111)
Creatinine, Ser: 1.29 mg/dL — ABNORMAL HIGH (ref 0.61–1.24)
GFR, Estimated: >60 mL/min (ref >60)
Glucose, Bld: 99 mg/dL (ref 70–99)
Potassium: 3.8 mmol/L (ref 3.5–5.1)
Sodium: 136 mmol/L (ref 135–145)
Total Bilirubin: 0.4 mg/dL (ref 0.3–1.2)
Total Protein: 7 g/dL (ref 6.5–8.1)

## 2021-03-10 LAB — CBC WITH DIFFERENTIAL/PLATELET
Abs Immature Granulocytes: 0.03 10*3/uL (ref 0.00–0.07)
Basophils Absolute: 0 10*3/uL (ref 0.0–0.1)
Basophils Relative: 0 %
Eosinophils Absolute: 0.1 10*3/uL (ref 0.0–0.5)
Eosinophils Relative: 2 %
HCT: 29.4 % — ABNORMAL LOW (ref 39.0–52.0)
Hemoglobin: 9.6 g/dL — ABNORMAL LOW (ref 13.0–17.0)
Immature Granulocytes: 1 %
Lymphocytes Relative: 23 %
Lymphs Abs: 0.9 10*3/uL (ref 0.7–4.0)
MCH: 32.4 pg (ref 26.0–34.0)
MCHC: 32.7 g/dL (ref 30.0–36.0)
MCV: 99.3 fL (ref 80.0–100.0)
Monocytes Absolute: 0.2 10*3/uL (ref 0.1–1.0)
Monocytes Relative: 5 %
Neutro Abs: 2.8 10*3/uL (ref 1.7–7.7)
Neutrophils Relative %: 69 %
Platelets: 190 10*3/uL (ref 150–400)
RBC: 2.96 MIL/uL — ABNORMAL LOW (ref 4.22–5.81)
RDW: 15.7 % — ABNORMAL HIGH (ref 11.5–15.5)
WBC: 4.1 10*3/uL (ref 4.0–10.5)
nRBC: 0 % (ref 0.0–0.2)

## 2021-03-10 LAB — TSH: TSH: 3.333 u[IU]/mL (ref 0.350–4.500)

## 2021-03-10 MED ORDER — SODIUM CHLORIDE 0.9 % IV SOLN
200.4000 mg | Freq: Once | INTRAVENOUS | Status: AC
  Administered 2021-03-10: 200 mg via INTRAVENOUS
  Filled 2021-03-10: qty 20

## 2021-03-10 MED ORDER — FAMOTIDINE 20 MG IN NS 100 ML IVPB
20.0000 mg | Freq: Once | INTRAVENOUS | Status: AC
  Administered 2021-03-10: 20 mg via INTRAVENOUS
  Filled 2021-03-10: qty 20

## 2021-03-10 MED ORDER — SODIUM CHLORIDE 0.9 % IV SOLN
80.0000 mg/m2 | Freq: Once | INTRAVENOUS | Status: AC
  Administered 2021-03-10: 168 mg via INTRAVENOUS
  Filled 2021-03-10: qty 28

## 2021-03-10 MED ORDER — SODIUM CHLORIDE 0.9 % IV SOLN
10.0000 mg | Freq: Once | INTRAVENOUS | Status: AC
  Administered 2021-03-10: 10 mg via INTRAVENOUS
  Filled 2021-03-10: qty 10

## 2021-03-10 MED ORDER — DIPHENHYDRAMINE HCL 50 MG/ML IJ SOLN
25.0000 mg | Freq: Once | INTRAMUSCULAR | Status: AC
  Administered 2021-03-10: 25 mg via INTRAVENOUS
  Filled 2021-03-10: qty 1

## 2021-03-10 MED ORDER — HEPARIN SOD (PORK) LOCK FLUSH 100 UNIT/ML IV SOLN
INTRAVENOUS | Status: AC
  Filled 2021-03-10: qty 5

## 2021-03-10 MED ORDER — SODIUM CHLORIDE 0.9 % IV SOLN
Freq: Once | INTRAVENOUS | Status: AC
  Filled 2021-03-10: qty 250

## 2021-03-10 MED ORDER — PALONOSETRON HCL INJECTION 0.25 MG/5ML
0.2500 mg | Freq: Once | INTRAVENOUS | Status: AC
  Administered 2021-03-10: 0.25 mg via INTRAVENOUS
  Filled 2021-03-10: qty 5

## 2021-03-10 MED ORDER — SODIUM CHLORIDE 0.9 % IV SOLN
150.0000 mg | Freq: Once | INTRAVENOUS | Status: AC
  Administered 2021-03-10: 150 mg via INTRAVENOUS
  Filled 2021-03-10: qty 150

## 2021-03-10 MED ORDER — HEPARIN SOD (PORK) LOCK FLUSH 100 UNIT/ML IV SOLN
500.0000 [IU] | Freq: Once | INTRAVENOUS | Status: AC | PRN
  Administered 2021-03-10: 500 [IU]
  Filled 2021-03-10: qty 5

## 2021-03-10 NOTE — Progress Notes (Signed)
Pt has no concerns at this time. 

## 2021-03-10 NOTE — Patient Instructions (Signed)
[facility] [place] [facility]  Discharge Instructions: Thank you for choosing [facility] to provide your oncology and hematology care.  If you have a lab appointment with the Cancer Center, please go directly to the Cancer Center and check in at the registration area.  Wear comfortable clothing and clothing appropriate for easy access to any Portacath or PICC line.   We strive to give you quality time with your provider. You may need to reschedule your appointment if you arrive late (15 or more minutes).  Arriving late affects you and other patients whose appointments are after yours.  Also, if you miss three or more appointments without notifying the office, you may be dismissed from the clinic at the provider's discretion.      For prescription refill requests, have your pharmacy contact our office and allow 72 hours for refills to be completed.    Today you received the following chemotherapy and/or immunotherapy agents       To help prevent nausea and vomiting after your treatment, we encourage you to take your nausea medication as directed.  BELOW ARE SYMPTOMS THAT SHOULD BE REPORTED IMMEDIATELY: *FEVER GREATER THAN 100.4 F (38 C) OR HIGHER *CHILLS OR SWEATING *NAUSEA AND VOMITING THAT IS NOT CONTROLLED WITH YOUR NAUSEA MEDICATION *UNUSUAL SHORTNESS OF BREATH *UNUSUAL BRUISING OR BLEEDING *URINARY PROBLEMS (pain or burning when urinating, or frequent urination) *BOWEL PROBLEMS (unusual diarrhea, constipation, pain near the anus) TENDERNESS IN MOUTH AND THROAT WITH OR WITHOUT PRESENCE OF ULCERS (sore throat, sores in mouth, or a toothache) UNUSUAL RASH, SWELLING OR PAIN  UNUSUAL VAGINAL DISCHARGE OR ITCHING   Items with * indicate a potential emergency and should be followed up as soon as possible or go to the Emergency Department if any problems should occur.  Please show the CHEMOTHERAPY ALERT CARD or IMMUNOTHERAPY ALERT CARD at check-in to the  Emergency Department and triage nurse.  Should you have questions after your visit or need to cancel or reschedule your appointment, please contact [facility] [place] [facility]  [phone] and follow the prompts.  Office hours are 8:00 a.m. to 4:30 p.m. Monday - Friday. Please note that voicemails left after 4:00 p.m. may not be returned until the following business day.  We are closed weekends and major holidays. You have access to a nurse at all times for urgent questions. Please call the main number to the clinic [phone] and follow the prompts.  For any non-urgent questions, you may also contact your provider using MyChart. We now offer e-Visits for anyone 18 and older to request care online for non-urgent symptoms. For details visit [email or web address].   Also download the MyChart app! Go to the app store, search "MyChart", open the app, select , and log in with your MyChart username and password.  Due to Covid, a mask is required upon entering the hospital/clinic. If you do not have a mask, one will be given to you upon arrival. For doctor visits, patients may have 1 support person aged 18 or older with them. For treatment visits, patients cannot have anyone with them due to current Covid guidelines and our immunocompromised population.  

## 2021-03-11 LAB — T4: T4, Total: 7 ug/dL (ref 4.5–12.0)

## 2021-03-16 ENCOUNTER — Encounter: Payer: Self-pay | Admitting: Oncology

## 2021-03-18 ENCOUNTER — Encounter: Payer: Self-pay | Admitting: Oncology

## 2021-03-20 NOTE — Progress Notes (Signed)
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  INTERVAL HISTORY: Patient returns to clinic today for further evaluation, discussion of his PET scan results, and continuation of maintenance Keytruda.  He continues to feel well and remains asymptomatic.  He continues to tolerate his treatments without significant side effects. He does not complain of pain today.  He has no neurologic complaints.  He denies any recent fevers or illnesses. He has no chest pain, shortness of breath, cough, or hemoptysis.  He denies any nausea, vomiting, constipation, or diarrhea.  He has no urinary complaints.  Patient offers no specific complaints today.  REVIEW OF SYSTEMS:   Review of Systems  Constitutional: Negative.  Negative for fever, malaise/fatigue and weight loss.  Respiratory: Negative.  Negative for cough, hemoptysis and shortness of breath.   Cardiovascular: Negative.  Negative for chest pain and leg swelling.  Gastrointestinal: Negative.  Negative for abdominal pain.  Genitourinary: Negative.  Negative for dysuria.  Musculoskeletal: Negative.  Negative for neck pain.  Skin: Negative.  Negative for rash.  Neurological: Negative.  Negative for dizziness, focal weakness, weakness and headaches.  Psychiatric/Behavioral: Negative.  The patient is not nervous/anxious.    As per HPI. Otherwise, a complete review of systems is negative.  PAST MEDICAL HISTORY: Past Medical History:  Diagnosis Date   Cancer [facility])     Coronary artery disease    Hypertension     PAST SURGICAL HISTORY: Past Surgical History:  Procedure Laterality Date   CORONARY ARTERY BYPASS GRAFT  2010   Quadruple   DIRECT LARYNGOSCOPY  11/28/2020   Procedure: DIRECT LARYNGOSCOPY WITH BIOPSY;  Surgeon: Beverly Gust, MD;  Location: [facility] ORS;  Service: ENT;;   IR IMAGING GUIDED PORT INSERTION  01/01/2021   PULMONARY THROMBECTOMY Bilateral 01/13/2021   Procedure: PULMONARY THROMBECTOMY;  Surgeon: Katha Cabal, MD;  Location: [facility];  Service: Cardiovascular;  Laterality: Bilateral;   TRACHEOSTOMY TUBE PLACEMENT N/A 11/28/2020   Procedure: AWAKE TRACHEOSTOMY;  Surgeon: Beverly Gust, MD;  Location: [facility] ORS;  Service: ENT;  Laterality: N/A;    FAMILY HISTORY: Family History  Problem Relation Age of Onset   Arthritis Mother    Heart attack Father    Brain cancer Sister     ADVANCED DIRECTIVES (Y/N):  N  HEALTH MAINTENANCE: Social History   Tobacco Use   Smoking status: Former    Types: Cigarettes    Quit date: 10/17/2008    Years since quitting: 12.4   Smokeless tobacco: Never  Vaping Use   Vaping Use: Never used  Substance Use Topics   Alcohol use: Yes    Comment: occasionally   Drug use: Never     Colonoscopy:  PAP:  Bone density:  Lipid panel:  No Known Allergies  Current Outpatient Medications  Medication Sig Dispense Refill   amLODipine (NORVASC) 10 MG tablet Take 10 mg by mouth daily.     apixaban (ELIQUIS) 5 MG TABS tablet Take 1 tablet (5 mg total) by mouth 2 (two) times daily. 180 tablet 1   ascorbic acid (VITAMIN C) 500  MG tablet Take by mouth.     cyanocobalamin 1000 MCG tablet Take by mouth.     DENTA 5000 PLUS 1.1 % CREA dental cream      hydrALAZINE (APRESOLINE) 50 MG tablet Take 50 mg by mouth 2 (two) times daily.     olmesartan (BENICAR) 40 MG tablet Take 40 mg by mouth daily.     rivaroxaban (XARELTO) 20 MG TABS tablet Take 1 tablet (20 mg total) by mouth daily with  supper. 30 tablet 2   rosuvastatin (CRESTOR) 40 MG tablet Take 40 mg by mouth daily.     ALPRAZolam (XANAX) 0.5 MG tablet Take 1 tablet (0.5 mg total) by mouth daily as needed for anxiety (Take 1 hour prior to radiation). (Patient not taking: Reported on 03/24/2021) 30 tablet 1   feeding supplement (ENSURE ENLIVE / ENSURE PLUS) LIQD Take 237 mLs by mouth 3 (three) times daily between meals. (Patient not taking: Reported on 03/24/2021) 21330 mL 0   fluticasone (FLONASE) 50 MCG/ACT nasal spray Place 1 spray into both nostrils daily. (Patient not taking: Reported on 03/24/2021) 15.8 mL 0   ipratropium-albuterol (DUONEB) 0.5-2.5 (3) MG/3ML SOLN Take 3 mLs by nebulization every 6 (six) hours. (Patient not taking: Reported on 03/24/2021) 360 mL 0   ondansetron (ZOFRAN) 8 MG tablet Take 1 tablet (8 mg total) by mouth every 8 (eight) hours as needed for nausea or vomiting. (Patient not taking: Reported on 03/24/2021) 60 tablet 2   prochlorperazine (COMPAZINE) 10 MG tablet Take 1 tablet (10 mg total) by mouth every 6 (six) hours as needed for nausea or vomiting. (Patient not taking: Reported on 03/24/2021) 60 tablet 2   No current facility-administered medications for this visit.   Facility-Administered Medications Ordered in Other Visits  Medication Dose Route Frequency Provider Last Rate Last Admin   heparin lock flush 100 UNIT/ML injection            heparin lock flush 100 unit/mL  500 Units Intravenous Once Lloyd Huger, MD       sodium chloride flush (NS) 0.9 % injection 10 mL  10 mL Intravenous PRN Lloyd Huger, MD   10 mL at 01/06/21 1305    OBJECTIVE: Vitals:   03/24/21 1431  BP: 110/66  Pulse: 74  Resp: 18  SpO2: 99%      Body mass index is 31.31 kg/m.    ECOG FS:0 - Asymptomatic  General: Well-developed, well-nourished, no acute distress. Eyes: Pink conjunctiva, anicteric sclera. HEENT: Normocephalic, moist mucous membranes.  Trach in place. Lungs: No audible wheezing or  coughing. Heart: Regular rate and rhythm. Abdomen: Soft, nontender, no obvious distention. Musculoskeletal: No edema, cyanosis, or clubbing. Neuro: Alert, answering all questions appropriately. Cranial nerves grossly intact. Skin: No rashes or petechiae noted. Psych: Normal affect.  LAB RESULTS:  Lab Results  Component Value Date   NA 138 03/24/2021   K 3.6 03/24/2021   CL 101 03/24/2021   CO2 24 03/24/2021   GLUCOSE 100 (H) 03/24/2021   BUN 18 03/24/2021   CREATININE 1.51 (H) 03/24/2021   CALCIUM 8.9 03/24/2021   PROT 6.8 03/24/2021   ALBUMIN 3.7 03/24/2021   AST 19 03/24/2021   ALT 15 03/24/2021   ALKPHOS 44 03/24/2021   BILITOT 0.4 03/24/2021   GFRNONAA 51 (L) 03/24/2021   GFRAA 37 (L) 11/13/2019    Lab Results  Component Value Date   WBC 3.1 (L) 03/24/2021   NEUTROABS 1.8 03/24/2021   HGB 9.9 (L) 03/24/2021  HCT 31.0 (L) 03/24/2021   MCV 100.0 03/24/2021   PLT 249 03/24/2021     STUDIES: NM PET Image Restag (PS) Skull Base To Thigh  Result Date: 03/24/2021 CLINICAL DATA:  Subsequent treatment strategy for tonsillar carcinoma. EXAM: NUCLEAR MEDICINE PET SKULL BASE TO THIGH TECHNIQUE: 11.4 mCi F-18 FDG was injected intravenously. Full-ring PET imaging was performed from the skull base to thigh after the radiotracer. CT data was obtained and used for attenuation correction and anatomic localization. Fasting blood glucose: 93 mg/dl COMPARISON:  PET-CT 12/16/2020 FINDINGS: Mediastinal blood pool activity: SUV max 2.42 Liver activity: SUV max NA NECK: Complete interval resolution of the large hypermetabolic hypopharyngeal mass. Bilateral lymphadenopathy has also resolved. Incidental CT findings: None CHEST: Interval resolution of the hypermetabolic pulmonary metastatic disease. No residual measurable pulmonary nodules are identified. No enlarged or hypermetabolic mediastinal or hilar lymph nodes. No supraclavicular or axillary adenopathy. Incidental CT findings: Stable age  advanced vascular disease and evidence of prior coronary artery bypass surgery. ABDOMEN/PELVIS: No findings suspicious for abdominal/pelvic metastatic disease. Incidental CT findings: Stable small gallstones. Stable age advanced vascular disease but no aneurysm. SKELETON: No findings suspicious for osseous metastatic disease. Incidental CT findings: none IMPRESSION: 1. Complete metabolic response to treatment. The large hypopharyngeal/tonsillar mass has completely resolved as has the neck adenopathy and metastatic pulmonary nodules. 2. Stable age advanced vascular disease. Electronically Signed   By: Marijo Sanes M.D.   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   PLAN:    1.  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  Both malignancies are p16 positive.  Patient has had an emergency trach placed secondary to airway protection.  Repeat PET scan results from March 23, 2021 reviewed independently and report as above with complete metabolic response in patient's neck as well as the 3 metastatic lesions in his lung.  Patient completed cycle 4 of carboplatinum, Taxol, and Keytruda on March 10, 2021.  Proceed with maintenance Keytruda every 3 weeks up to 24 months.  A referral has been sent back to ENT for consideration of removal of trach.  Return to clinic in 3 weeks for further evaluation and consideration of cycle 6 of Keytruda.   2.  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  PET scan results from November 12, 2019 reviewed independently with no obvious evidence of recurrent or progressive disease.  Level 2 lymph node is no longer hypermetabolic.  3.  Renal insufficiency: Creatinine slightly elevated  today at 1.51.  Monitor.  Proceed with treatment as above. 4.  Anemia: Chronic and unchanged.  Patient's hemoglobin is 9.9 today. 5.  Hypokalemia: Resolved. 6.  Pulmonary embolism: Diagnosed in September 2022.  Continue Eliquis as prescribed.  Patient will require a minimum of 6 months of treatment. 7.  Leukopenia: Mild, monitor.  Patient expressed understanding and was in agreement with this plan. He also understands that He can call clinic at any time with any questions, concerns, or complaints.    Cancer Staging  Malignant neoplasm of supraglottis [facility]) Staging form: Larynx - Supraglottis, AJCC 8th Edition - Clinical stage from 12/11/2020: Stage IVC (cT3, cN2b, cM1) - Signed by Lloyd Huger, MD on 01/26/2021  Primary squamous cell  carcinoma of tonsil ([place]) Staging form: Pharynx - HPV-Mediated Oropharynx, AJCC 8th Edition - Clinical: No stage assigned - Unsigned   Lloyd Huger, MD   03/25/2021 11:27 AM

## 2021-03-23 ENCOUNTER — Ambulatory Visit
Admission: RE | Admit: 2021-03-23 | Discharge: 2021-03-23 | Disposition: A | Discharge status: Home / Self Care | Payer: Medicare Other | Source: Ambulatory Visit | Attending: Oncology | Admitting: Oncology

## 2021-03-23 ENCOUNTER — Other Ambulatory Visit: Payer: Self-pay

## 2021-03-23 DIAGNOSIS — C321 Malignant neoplasm of supraglottis: Secondary | ICD-10-CM | POA: Insufficient documentation

## 2021-03-23 LAB — GLUCOSE, CAPILLARY: Glucose-Capillary: 93 mg/dL (ref 70–99)

## 2021-03-23 MED ORDER — FLUDEOXYGLUCOSE F - 18 (FDG) INJECTION
10.9000 | Freq: Once | INTRAVENOUS | Status: AC | PRN
  Administered 2021-03-23: 11.4 via INTRAVENOUS

## 2021-03-24 ENCOUNTER — Inpatient Hospital Stay: Payer: Medicare Other

## 2021-03-24 ENCOUNTER — Other Ambulatory Visit (HOSPITAL_COMMUNITY): Payer: Self-pay

## 2021-03-24 ENCOUNTER — Other Ambulatory Visit: Payer: Self-pay

## 2021-03-24 ENCOUNTER — Inpatient Hospital Stay (HOSPITAL_BASED_OUTPATIENT_CLINIC_OR_DEPARTMENT_OTHER): Payer: Medicare Other | Admitting: Oncology

## 2021-03-24 ENCOUNTER — Inpatient Hospital Stay: Payer: Medicare Other | Attending: Oncology

## 2021-03-24 ENCOUNTER — Encounter: Payer: Self-pay | Admitting: Oncology

## 2021-03-24 VITALS — BP 110/66 | HR 74 | Resp 18 | Wt 212.0 lb

## 2021-03-24 DIAGNOSIS — Z7901 Long term (current) use of anticoagulants: Secondary | ICD-10-CM | POA: Diagnosis not present

## 2021-03-24 DIAGNOSIS — C321 Malignant neoplasm of supraglottis: Secondary | ICD-10-CM | POA: Insufficient documentation

## 2021-03-24 DIAGNOSIS — Z87891 Personal history of nicotine dependence: Secondary | ICD-10-CM | POA: Diagnosis not present

## 2021-03-24 DIAGNOSIS — I2699 Other pulmonary embolism without acute cor pulmonale: Secondary | ICD-10-CM | POA: Insufficient documentation

## 2021-03-24 DIAGNOSIS — N289 Disorder of kidney and ureter, unspecified: Secondary | ICD-10-CM | POA: Diagnosis not present

## 2021-03-24 DIAGNOSIS — Z79899 Other long term (current) drug therapy: Secondary | ICD-10-CM | POA: Diagnosis not present

## 2021-03-24 DIAGNOSIS — D72819 Decreased white blood cell count, unspecified: Secondary | ICD-10-CM | POA: Insufficient documentation

## 2021-03-24 DIAGNOSIS — Z930 Tracheostomy status: Secondary | ICD-10-CM | POA: Insufficient documentation

## 2021-03-24 DIAGNOSIS — Z5112 Encounter for antineoplastic immunotherapy: Secondary | ICD-10-CM | POA: Insufficient documentation

## 2021-03-24 DIAGNOSIS — D649 Anemia, unspecified: Secondary | ICD-10-CM | POA: Insufficient documentation

## 2021-03-24 LAB — CBC WITH DIFFERENTIAL/PLATELET
Abs Immature Granulocytes: 0.02 10*3/uL (ref 0.00–0.07)
Basophils Absolute: 0 10*3/uL (ref 0.0–0.1)
Basophils Relative: 1 %
Eosinophils Absolute: 0 10*3/uL (ref 0.0–0.5)
Eosinophils Relative: 1 %
HCT: 31 % — ABNORMAL LOW (ref 39.0–52.0)
Hemoglobin: 9.9 g/dL — ABNORMAL LOW (ref 13.0–17.0)
Immature Granulocytes: 1 %
Lymphocytes Relative: 19 %
Lymphs Abs: 0.6 10*3/uL — ABNORMAL LOW (ref 0.7–4.0)
MCH: 31.9 pg (ref 26.0–34.0)
MCHC: 31.9 g/dL (ref 30.0–36.0)
MCV: 100 fL (ref 80.0–100.0)
Monocytes Absolute: 0.7 10*3/uL (ref 0.1–1.0)
Monocytes Relative: 21 %
Neutro Abs: 1.8 10*3/uL (ref 1.7–7.7)
Neutrophils Relative %: 57 %
Platelets: 249 10*3/uL (ref 150–400)
RBC: 3.1 MIL/uL — ABNORMAL LOW (ref 4.22–5.81)
RDW: 15.9 % — ABNORMAL HIGH (ref 11.5–15.5)
WBC: 3.1 10*3/uL — ABNORMAL LOW (ref 4.0–10.5)
nRBC: 0 % (ref 0.0–0.2)

## 2021-03-24 LAB — COMPREHENSIVE METABOLIC PANEL
ALT: 15 U/L (ref 0–44)
AST: 19 U/L (ref 15–41)
Albumin: 3.7 g/dL (ref 3.5–5.0)
Alkaline Phosphatase: 44 U/L (ref 38–126)
Anion gap: 13 (ref 5–15)
BUN: 18 mg/dL (ref 8–23)
CO2: 24 mmol/L (ref 22–32)
Calcium: 8.9 mg/dL (ref 8.9–10.3)
Chloride: 101 mmol/L (ref 98–111)
Creatinine, Ser: 1.51 mg/dL — ABNORMAL HIGH (ref 0.61–1.24)
GFR, Estimated: 51 mL/min — ABNORMAL LOW (ref >60)
Glucose, Bld: 100 mg/dL — ABNORMAL HIGH (ref 70–99)
Potassium: 3.6 mmol/L (ref 3.5–5.1)
Sodium: 138 mmol/L (ref 135–145)
Total Bilirubin: 0.4 mg/dL (ref 0.3–1.2)
Total Protein: 6.8 g/dL (ref 6.5–8.1)

## 2021-03-24 LAB — TSH: TSH: 1.849 u[IU]/mL (ref 0.350–4.500)

## 2021-03-24 MED ORDER — HEPARIN SOD (PORK) LOCK FLUSH 100 UNIT/ML IV SOLN
500.0000 [IU] | Freq: Once | INTRAVENOUS | Status: AC | PRN
  Administered 2021-03-24: 500 [IU]
  Filled 2021-03-24: qty 5

## 2021-03-24 MED ORDER — RIVAROXABAN 20 MG PO TABS
20.0000 mg | ORAL_TABLET | Freq: Every day | ORAL | 2 refills | Status: DC
  Filled 2021-03-24: qty 30, 30d supply, fill #0

## 2021-03-24 MED ORDER — SODIUM CHLORIDE 0.9% FLUSH
10.0000 mL | INTRAVENOUS | Status: DC | PRN
  Administered 2021-03-24: 10 mL via INTRAVENOUS
  Filled 2021-03-24: qty 10

## 2021-03-24 MED ORDER — HEPARIN SOD (PORK) LOCK FLUSH 100 UNIT/ML IV SOLN
500.0000 [IU] | Freq: Once | INTRAVENOUS | Status: DC
  Filled 2021-03-24: qty 5

## 2021-03-24 MED ORDER — HEPARIN SOD (PORK) LOCK FLUSH 100 UNIT/ML IV SOLN
INTRAVENOUS | Status: AC
  Filled 2021-03-24: qty 5

## 2021-03-24 MED ORDER — SODIUM CHLORIDE 0.9 % IV SOLN
200.0000 mg | Freq: Once | INTRAVENOUS | Status: AC
  Administered 2021-03-24: 200 mg via INTRAVENOUS
  Filled 2021-03-24: qty 8

## 2021-03-24 MED ORDER — SODIUM CHLORIDE 0.9 % IV SOLN
Freq: Once | INTRAVENOUS | Status: AC
  Filled 2021-03-24: qty 250

## 2021-03-24 NOTE — Patient Instructions (Signed)
[facility]  Discharge Instructions: °Thank you for choosing [facility] to provide your oncology and hematology care.  °If you have a lab appointment with the Cancer Center, please go directly to the Cancer Center and check in at the registration area. ° °Wear comfortable clothing and clothing appropriate for easy access to any Portacath or PICC line.  ° °We strive to give you quality time with your provider. You may need to reschedule your appointment if you arrive late (15 or more minutes).  Arriving late affects you and other patients whose appointments are after yours.  Also, if you miss three or more appointments without notifying the office, you may be dismissed from the clinic at the provider’s discretion.    °  °For prescription refill requests, have your pharmacy contact our office and allow 72 hours for refills to be completed.   ° °Today you received the following chemotherapy and/or immunotherapy agents Keytruda °    °  °To help prevent nausea and vomiting after your treatment, we encourage you to take your nausea medication as directed. ° °BELOW ARE SYMPTOMS THAT SHOULD BE REPORTED IMMEDIATELY: °*FEVER GREATER THAN 100.4 F (38 °C) OR HIGHER °*CHILLS OR SWEATING °*NAUSEA AND VOMITING THAT IS NOT CONTROLLED WITH YOUR NAUSEA MEDICATION °*UNUSUAL SHORTNESS OF BREATH °*UNUSUAL BRUISING OR BLEEDING °*URINARY PROBLEMS (pain or burning when urinating, or frequent urination) °*BOWEL PROBLEMS (unusual diarrhea, constipation, pain near the anus) °TENDERNESS IN MOUTH AND THROAT WITH OR WITHOUT PRESENCE OF ULCERS (sore throat, sores in mouth, or a toothache) °UNUSUAL RASH, SWELLING OR PAIN  °UNUSUAL VAGINAL DISCHARGE OR ITCHING  ° °Items with * indicate a potential emergency and should be followed up as soon as possible or go to the Emergency Department if any problems should occur. ° °Please show the CHEMOTHERAPY ALERT CARD or IMMUNOTHERAPY ALERT CARD at check-in to  the Emergency Department and triage nurse. ° °Should you have questions after your visit or need to cancel or reschedule your appointment, please contact [facility]  [phone] and follow the prompts.  Office hours are 8:00 a.m. to 4:30 p.m. Monday - Friday. Please note that voicemails left after 4:00 p.m. may not be returned until the following business day.  We are closed weekends and major holidays. You have access to a nurse at all times for urgent questions. Please call the main number to the clinic [phone] and follow the prompts. ° °For any non-urgent questions, you may also contact your provider using MyChart. We now offer e-Visits for anyone 18 and older to request care online for non-urgent symptoms. For details visit [email or web address]. °  °Also download the MyChart app! Go to the app store, search "MyChart", open the app, select [facility], and log in with your MyChart username and password. ° °Due to Covid, a mask is required upon entering the hospital/clinic. If you do not have a mask, one will be given to you upon arrival. For doctor visits, patients may have 1 support person aged 18 or older with them. For treatment visits, patients cannot have anyone with them due to current Covid guidelines and our immunocompromised population.  °

## 2021-03-24 NOTE — Progress Notes (Signed)
Nutrition Follow-up:  Patient with history of stage IV squamous cell carcinoma of left tonsil, P 16+ now with second primary in supraglottis stage IV, p 16+.  S/p emergent trach on 8/12.  Patient receiving keytruda only.  Met with patient during infusion.  Patient reports that appetite is good. Swallowing is better and continues to eat more foods.  Denies any nutritional concerns.   Medications: reviewed  Labs: reviewed  Anthropometrics:   Weight 212 lb today  209 lb 3.2 oz on 11/15 201 lb 6.4 oz on 10/11 198 lb on 10/4 205 lb on 9/20 217 lb on 05/20/20   NUTRITION DIAGNOSIS: Inadequate oral intake improving   INTERVENTION:  Patient to continue eating well balanced diet with good sources of protein.      MONITORING, EVALUATION, GOAL: weight trends, intake   NEXT VISIT: ~ 6 weeks with treatment  Joli B. Allen, RD, LDN Registered Dietitian [phone] (mobile)   

## 2021-03-25 ENCOUNTER — Encounter: Payer: Self-pay | Admitting: Oncology

## 2021-03-25 LAB — T4: T4, Total: 6.9 ug/dL (ref 4.5–12.0)

## 2021-04-05 ENCOUNTER — Other Ambulatory Visit: Payer: Self-pay

## 2021-04-13 NOTE — Progress Notes (Signed)
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  INTERVAL HISTORY: Patient returns to clinic today for further evaluation and continuation of maintenance Keytruda.  He continues to feel well and remains asymptomatic.  He is tolerating his treatments without significant side effects.  He does not complain of pain today.  He has no neurologic complaints.  He denies any recent fevers or illnesses. He has no chest pain, shortness of breath, cough, or hemoptysis.  He denies any nausea, vomiting, constipation, or diarrhea.  He has no urinary complaints.  Patient has no specific complaints today.  REVIEW OF SYSTEMS:   Review of Systems  Constitutional: Negative.  Negative for fever, malaise/fatigue and weight loss.  Respiratory: Negative.  Negative for cough, hemoptysis and shortness of breath.   Cardiovascular: Negative.  Negative for chest pain and leg swelling.  Gastrointestinal: Negative.  Negative for abdominal pain.  Genitourinary: Negative.  Negative for dysuria.  Musculoskeletal: Negative.  Negative for neck pain.  Skin: Negative.  Negative for rash.  Neurological: Negative.  Negative for dizziness, focal weakness, weakness and headaches.  Psychiatric/Behavioral: Negative.  The patient is not nervous/anxious.    As per HPI. Otherwise, a complete review of systems is negative.  PAST MEDICAL HISTORY: Past Medical History:  Diagnosis Date   Cancer [facility])    Coronary artery disease    Hypertension      PAST SURGICAL HISTORY: Past Surgical History:  Procedure Laterality Date   CORONARY ARTERY BYPASS GRAFT  2010   Quadruple   DIRECT LARYNGOSCOPY  11/28/2020   Procedure: DIRECT LARYNGOSCOPY WITH BIOPSY;  Surgeon: Beverly Gust, MD;  Location: [facility] ORS;  Service: ENT;;   IR IMAGING GUIDED PORT INSERTION  01/01/2021   PULMONARY THROMBECTOMY Bilateral 01/13/2021   Procedure: PULMONARY THROMBECTOMY;  Surgeon: Katha Cabal, MD;  Location: [facility];  Service: Cardiovascular;  Laterality: Bilateral;   TRACHEOSTOMY TUBE PLACEMENT N/A 11/28/2020   Procedure: AWAKE TRACHEOSTOMY;  Surgeon: Beverly Gust, MD;  Location: [facility] OR[facility];  Service: ENT;  Laterality: N/A;    FAMILY HISTORY: Family History  Problem Relation Age of Onset   Arthritis Mother    Heart attack Father    Brain cancer Sister     ADVANCED DIRECTIVES (Y/N):  N  HEALTH MAINTENANCE: Social History   Tobacco Use   Smoking status: Former    Types: Cigarettes    Quit date: 10/17/2008    Years since quitting: 12.4   Smokeless tobacco: Never  Vaping Use   Vaping Use: Never used  Substance Use Topics   Alcohol use: Yes    Comment: occasionally   Drug use: Never     Colonoscopy:  PAP:  Bone density:  Lipid panel:  No Known Allergies  Current Outpatient Medications  Medication Sig Dispense Refill   amLODipine (NORVASC) 10 MG tablet Take 10 mg by mouth daily.     ascorbic acid (VITAMIN C) 500 MG tablet Take by mouth.     cyanocobalamin 1000 MCG tablet Take by mouth.     DENTA 5000 PLUS 1.1 % CREA dental cream  hydrALAZINE (APRESOLINE) 50 MG tablet Take 50 mg by mouth 2 (two) times daily.     olmesartan (BENICAR) 40 MG tablet Take 40 mg by mouth daily.     rivaroxaban (XARELTO) 20 MG TABS tablet Take 1 tablet (20 mg total) by mouth daily with supper. 30 tablet 2   rosuvastatin (CRESTOR) 40 MG tablet Take 40 mg by mouth daily.     ALPRAZolam (XANAX) 0.5 MG tablet Take 1 tablet (0.5 mg total) by  mouth daily as needed for anxiety (Take 1 hour prior to radiation). (Patient not taking: Reported on 04/14/2021) 30 tablet 1   apixaban (ELIQUIS) 5 MG TABS tablet Take 1 tablet (5 mg total) by mouth 2 (two) times daily. (Patient not taking: Reported on 04/14/2021) 180 tablet 1   feeding supplement (ENSURE ENLIVE / ENSURE PLUS) LIQD Take 237 mLs by mouth 3 (three) times daily between meals. (Patient not taking: Reported on 03/24/2021) 21330 mL 0   fluticasone (FLONASE) 50 MCG/ACT nasal spray Place 1 spray into both nostrils daily. (Patient not taking: Reported on 03/24/2021) 15.8 mL 0   gentamicin ointment (GARAMYCIN) 0.1 % Apply topically. (Patient not taking: Reported on 04/14/2021)     ipratropium-albuterol (DUONEB) 0.5-2.5 (3) MG/3ML SOLN Take 3 mLs by nebulization every 6 (six) hours. (Patient not taking: Reported on 03/24/2021) 360 mL 0   ondansetron (ZOFRAN) 8 MG tablet Take 1 tablet (8 mg total) by mouth every 8 (eight) hours as needed for nausea or vomiting. (Patient not taking: Reported on 04/14/2021) 60 tablet 2   prochlorperazine (COMPAZINE) 10 MG tablet Take 1 tablet (10 mg total) by mouth every 6 (six) hours as needed for nausea or vomiting. (Patient not taking: Reported on 04/14/2021) 60 tablet 2   No current facility-administered medications for this visit.   Facility-Administered Medications Ordered in Other Visits  Medication Dose Route Frequency Provider Last Rate Last Admin   heparin lock flush 100 UNIT/ML injection            heparin lock flush 100 UNIT/ML injection            heparin lock flush 100 unit/mL  500 Units Intravenous Once Grayland Ormond, Kathlene November, MD       heparin lock flush 100 unit/mL  500 Units Intracatheter Once PRN Lloyd Huger, MD       pembrolizumab [facility]) 200 mg in sodium chloride 0.9 % 50 mL chemo infusion  200 mg Intravenous Once Lloyd Huger, MD 116 mL/hr at 04/14/21 1443 200 mg at 04/14/21 1443   sodium chloride flush (NS) 0.9 % injection 10 mL   10 mL Intravenous PRN Lloyd Huger, MD   10 mL at 01/06/21 1305   sodium chloride flush (NS) 0.9 % injection 10 mL  10 mL Intracatheter PRN Lloyd Huger, MD        OBJECTIVE: Vitals:   04/14/21 1355  BP: 119/77  Pulse: 76  Resp: 16  Temp: (!) 97.2 F (36.2 C)  SpO2: 98%      Body mass index is 31.5 kg/m.    ECOG FS:0 - Asymptomatic  General: Well-developed, well-nourished, no acute distress. Eyes: Pink conjunctiva, anicteric sclera. HEENT: Normocephalic, moist mucous membranes.  Trach in place. Lungs: No audible wheezing or coughing. Heart: Regular rate and rhythm. Abdomen: Soft, nontender, no obvious distention. Musculoskeletal: No edema, cyanosis, or clubbing. Neuro: Alert, answering all questions appropriately. Cranial nerves grossly intact. Skin: No rashes or petechiae noted. Psych: Normal affect.   LAB RESULTS:  Lab Results  Component Value Date   NA 136 04/14/2021   K 3.3 (L) 04/14/2021   CL 102 04/14/2021   CO2 25 04/14/2021   GLUCOSE 106 (H) 04/14/2021   BUN 22 04/14/2021   CREATININE 1.42 (H) 04/14/2021   CALCIUM 8.9 04/14/2021   PROT 7.0 04/14/2021   ALBUMIN 3.9 04/14/2021   AST 20 04/14/2021   ALT 13 04/14/2021   ALKPHOS 44 04/14/2021   BILITOT 0.6 04/14/2021   GFRNONAA 55 (L) 04/14/2021   GFRAA 37 (L) 11/13/2019    Lab Results  Component Value Date   WBC 4.7 04/14/2021   NEUTROABS 3.3 04/14/2021   HGB 10.9 (L) 04/14/2021   HCT 34.3 (L) 04/14/2021   MCV 99.7 04/14/2021   PLT 204 04/14/2021     STUDIES: NM PET Image Restag (PS) Skull Base To Thigh  Result Date: 03/24/2021 CLINICAL DATA:  Subsequent treatment strategy for tonsillar carcinoma. EXAM: NUCLEAR MEDICINE PET SKULL BASE TO THIGH TECHNIQUE: 11.4 mCi F-18 FDG was injected intravenously. Full-ring PET imaging was performed from the skull base to thigh after the radiotracer. CT data was obtained and used for attenuation correction and anatomic localization. Fasting blood  glucose: 93 mg/dl COMPARISON:  PET-CT 12/16/2020 FINDINGS: Mediastinal blood pool activity: SUV max 2.42 Liver activity: SUV max NA NECK: Complete interval resolution of the large hypermetabolic hypopharyngeal mass. Bilateral lymphadenopathy has also resolved. Incidental CT findings: None CHEST: Interval resolution of the hypermetabolic pulmonary metastatic disease. No residual measurable pulmonary nodules are identified. No enlarged or hypermetabolic mediastinal or hilar lymph nodes. No supraclavicular or axillary adenopathy. Incidental CT findings: Stable age advanced vascular disease and evidence of prior coronary artery bypass surgery. ABDOMEN/PELVIS: No findings suspicious for abdominal/pelvic metastatic disease. Incidental CT findings: Stable small gallstones. Stable age advanced vascular disease but no aneurysm. SKELETON: No findings suspicious for osseous metastatic disease. Incidental CT findings: none IMPRESSION: 1. Complete metabolic response to treatment. The large hypopharyngeal/tonsillar mass has completely resolved as has the neck adenopathy and metastatic pulmonary nodules. 2. Stable age advanced vascular disease. Electronically Signed   By: Marijo Sanes M.D.   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   PLAN:    1.  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  Both malignancies are p16 positive.  Patient has had an emergency trach placed secondary to airway protection.  Repeat PET scan results from March 23, 2021 reviewed independently and reported as above with complete metabolic response in patient's neck as well as the 3 metastatic lesions in his lung.  Patient completed cycle 4 of carboplatinum, Taxol, and Keytruda on March 10, 2021.  Continue maintenance Keytruda  every 3 weeks up to 24 months.  Proceed with treatment today.  Patient was seen by ENT with plans to downsize his trach and then ultimately removed completely.  Return to clinic in 3 weeks for further evaluation and consideration of cycle 7 of Keytruda.   2.  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  PET scan results from November 12, 2019 reviewed independently with no obvious evidence of recurrent or progressive disease.  Level 2 lymph node is no longer hypermetabolic.  3.  Renal insufficiency: Creatinine mildly improved to 1.42.  Monitor.  Proceed with treatment as above. 4.  Anemia: Hemoglobin improved to 10.9.   5.  Hypokalemia: Mild, monitor. 6.  Pulmonary embolism: Diagnosed in September 2022.  Continue Eliquis as prescribed.  Patient will require a minimum of 6 months of treatment. 7.  Leukopenia: Resolved.  Patient expressed understanding and was in agreement with this plan. He also understands that He can call clinic at any time with any questions, concerns, or complaints.    Cancer Staging  Malignant neoplasm of supraglottis [facility]) Staging form: Larynx - Supraglottis, AJCC 8th Edition - Clinical stage from 12/11/2020: Stage IVC (cT3, cN2b, cM1) - Signed by Lloyd Huger, MD on 01/26/2021  Primary squamous cell carcinoma of tonsil ([place]) Staging form: Pharynx - HPV-Mediated Oropharynx, AJCC 8th Edition - Clinical: No stage assigned - Unsigned   Lloyd Huger, MD   04/14/2021 3:01 PM

## 2021-04-14 ENCOUNTER — Inpatient Hospital Stay: Payer: Medicare Other

## 2021-04-14 ENCOUNTER — Other Ambulatory Visit: Payer: Self-pay

## 2021-04-14 ENCOUNTER — Inpatient Hospital Stay (HOSPITAL_BASED_OUTPATIENT_CLINIC_OR_DEPARTMENT_OTHER): Payer: Medicare Other | Admitting: Oncology

## 2021-04-14 ENCOUNTER — Encounter: Payer: Self-pay | Admitting: Oncology

## 2021-04-14 VITALS — BP 119/77 | HR 76 | Temp 97.2°F | Resp 16 | Wt 213.3 lb

## 2021-04-14 DIAGNOSIS — C321 Malignant neoplasm of supraglottis: Secondary | ICD-10-CM | POA: Diagnosis not present

## 2021-04-14 DIAGNOSIS — Z5112 Encounter for antineoplastic immunotherapy: Secondary | ICD-10-CM | POA: Diagnosis not present

## 2021-04-14 LAB — CBC WITH DIFFERENTIAL/PLATELET
Abs Immature Granulocytes: 0.01 10*3/uL (ref 0.00–0.07)
Basophils Absolute: 0 10*3/uL (ref 0.0–0.1)
Basophils Relative: 0 %
Eosinophils Absolute: 0.1 10*3/uL (ref 0.0–0.5)
Eosinophils Relative: 3 %
HCT: 34.3 % — ABNORMAL LOW (ref 39.0–52.0)
Hemoglobin: 10.9 g/dL — ABNORMAL LOW (ref 13.0–17.0)
Immature Granulocytes: 0 %
Lymphocytes Relative: 14 %
Lymphs Abs: 0.6 10*3/uL — ABNORMAL LOW (ref 0.7–4.0)
MCH: 31.7 pg (ref 26.0–34.0)
MCHC: 31.8 g/dL (ref 30.0–36.0)
MCV: 99.7 fL (ref 80.0–100.0)
Monocytes Absolute: 0.6 10*3/uL (ref 0.1–1.0)
Monocytes Relative: 14 %
Neutro Abs: 3.3 10*3/uL (ref 1.7–7.7)
Neutrophils Relative %: 69 %
Platelets: 204 10*3/uL (ref 150–400)
RBC: 3.44 MIL/uL — ABNORMAL LOW (ref 4.22–5.81)
RDW: 15.7 % — ABNORMAL HIGH (ref 11.5–15.5)
WBC: 4.7 10*3/uL (ref 4.0–10.5)
nRBC: 0 % (ref 0.0–0.2)

## 2021-04-14 LAB — COMPREHENSIVE METABOLIC PANEL
ALT: 13 U/L (ref 0–44)
AST: 20 U/L (ref 15–41)
Albumin: 3.9 g/dL (ref 3.5–5.0)
Alkaline Phosphatase: 44 U/L (ref 38–126)
Anion gap: 9 (ref 5–15)
BUN: 22 mg/dL (ref 8–23)
CO2: 25 mmol/L (ref 22–32)
Calcium: 8.9 mg/dL (ref 8.9–10.3)
Chloride: 102 mmol/L (ref 98–111)
Creatinine, Ser: 1.42 mg/dL — ABNORMAL HIGH (ref 0.61–1.24)
GFR, Estimated: 55 mL/min — ABNORMAL LOW (ref >60)
Glucose, Bld: 106 mg/dL — ABNORMAL HIGH (ref 70–99)
Potassium: 3.3 mmol/L — ABNORMAL LOW (ref 3.5–5.1)
Sodium: 136 mmol/L (ref 135–145)
Total Bilirubin: 0.6 mg/dL (ref 0.3–1.2)
Total Protein: 7 g/dL (ref 6.5–8.1)

## 2021-04-14 LAB — TSH: TSH: 1.916 u[IU]/mL (ref 0.350–4.500)

## 2021-04-14 MED ORDER — SODIUM CHLORIDE 0.9 % IV SOLN
Freq: Once | INTRAVENOUS | Status: AC
  Filled 2021-04-14: qty 250

## 2021-04-14 MED ORDER — SODIUM CHLORIDE 0.9 % IV SOLN
200.0000 mg | Freq: Once | INTRAVENOUS | Status: AC
  Administered 2021-04-14: 15:00:00 200 mg via INTRAVENOUS
  Filled 2021-04-14: qty 8

## 2021-04-14 MED ORDER — SODIUM CHLORIDE 0.9% FLUSH
10.0000 mL | INTRAVENOUS | Status: DC | PRN
  Filled 2021-04-14: qty 10

## 2021-04-14 MED ORDER — HEPARIN SOD (PORK) LOCK FLUSH 100 UNIT/ML IV SOLN
INTRAVENOUS | Status: AC
  Administered 2021-04-14: 16:00:00 500 [IU]
  Filled 2021-04-14: qty 5

## 2021-04-14 MED ORDER — HEPARIN SOD (PORK) LOCK FLUSH 100 UNIT/ML IV SOLN
500.0000 [IU] | Freq: Once | INTRAVENOUS | Status: AC | PRN
  Filled 2021-04-14: qty 5

## 2021-04-14 NOTE — Patient Instructions (Signed)
[facility]  Discharge Instructions: °Thank you for choosing [facility] to provide your oncology and hematology care.  °If you have a lab appointment with the Cancer Center, please go directly to the Cancer Center and check in at the registration area. ° °Wear comfortable clothing and clothing appropriate for easy access to any Portacath or PICC line.  ° °We strive to give you quality time with your provider. You may need to reschedule your appointment if you arrive late (15 or more minutes).  Arriving late affects you and other patients whose appointments are after yours.  Also, if you miss three or more appointments without notifying the office, you may be dismissed from the clinic at the provider’s discretion.    °  °For prescription refill requests, have your pharmacy contact our office and allow 72 hours for refills to be completed.   ° °Today you received the following chemotherapy and/or immunotherapy agents Keytruda °    °  °To help prevent nausea and vomiting after your treatment, we encourage you to take your nausea medication as directed. ° °BELOW ARE SYMPTOMS THAT SHOULD BE REPORTED IMMEDIATELY: °*FEVER GREATER THAN 100.4 F (38 °C) OR HIGHER °*CHILLS OR SWEATING °*NAUSEA AND VOMITING THAT IS NOT CONTROLLED WITH YOUR NAUSEA MEDICATION °*UNUSUAL SHORTNESS OF BREATH °*UNUSUAL BRUISING OR BLEEDING °*URINARY PROBLEMS (pain or burning when urinating, or frequent urination) °*BOWEL PROBLEMS (unusual diarrhea, constipation, pain near the anus) °TENDERNESS IN MOUTH AND THROAT WITH OR WITHOUT PRESENCE OF ULCERS (sore throat, sores in mouth, or a toothache) °UNUSUAL RASH, SWELLING OR PAIN  °UNUSUAL VAGINAL DISCHARGE OR ITCHING  ° °Items with * indicate a potential emergency and should be followed up as soon as possible or go to the Emergency Department if any problems should occur. ° °Please show the CHEMOTHERAPY ALERT CARD or IMMUNOTHERAPY ALERT CARD at check-in to  the Emergency Department and triage nurse. ° °Should you have questions after your visit or need to cancel or reschedule your appointment, please contact [facility]  [phone] and follow the prompts.  Office hours are 8:00 a.m. to 4:30 p.m. Monday - Friday. Please note that voicemails left after 4:00 p.m. may not be returned until the following business day.  We are closed weekends and major holidays. You have access to a nurse at all times for urgent questions. Please call the main number to the clinic [phone] and follow the prompts. ° °For any non-urgent questions, you may also contact your provider using MyChart. We now offer e-Visits for anyone 18 and older to request care online for non-urgent symptoms. For details visit [email or web address]. °  °Also download the MyChart app! Go to the app store, search "MyChart", open the app, select [facility], and log in with your MyChart username and password. ° °Due to Covid, a mask is required upon entering the hospital/clinic. If you do not have a mask, one will be given to you upon arrival. For doctor visits, patients may have 1 support person aged 18 or older with them. For treatment visits, patients cannot have anyone with them due to current Covid guidelines and our immunocompromised population.  °

## 2021-04-15 LAB — T4: T4, Total: 6.1 ug/dL (ref 4.5–12.0)

## 2021-04-28 ENCOUNTER — Encounter: Payer: Self-pay | Admitting: Oncology

## 2021-05-04 NOTE — Progress Notes (Signed)
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  INTERVAL HISTORY: Patient returns to clinic today for further evaluation and continuation of maintenance Keytruda.  He is tolerating his treatment well without significant side effects.  He recently had his trach removed.  He currently feels well and is asymptomatic.  He does admit to a mild peripheral neuropathy in his feet that does not affect his day-to-day activity.  He has no other neurologic complaints.  He denies any pain.  He denies any recent fevers or illnesses. He has no chest pain, shortness of breath, cough, or hemoptysis.  He denies any nausea, vomiting, constipation, or diarrhea.  He has no urinary complaints.  Patient offers no further specific complaints today.  REVIEW OF SYSTEMS:   Review of Systems  Constitutional: Negative.  Negative for fever, malaise/fatigue and weight loss.  Respiratory: Negative.  Negative for cough, hemoptysis and shortness of breath.   Cardiovascular: Negative.  Negative for chest pain and leg swelling.  Gastrointestinal: Negative.  Negative for abdominal pain.  Genitourinary: Negative.  Negative for dysuria.  Musculoskeletal: Negative.  Negative for neck pain.  Skin: Negative.  Negative for rash.  Neurological: Negative.  Negative for dizziness, focal weakness, weakness and headaches.  Psychiatric/Behavioral: Negative.  The patient is not nervous/anxious.    As per HPI. Otherwise, a complete review of systems is  negative.  PAST MEDICAL HISTORY: Past Medical History:  Diagnosis Date   Cancer [facility])    Coronary artery disease    Hypertension     PAST SURGICAL HISTORY: Past Surgical History:  Procedure Laterality Date   CORONARY ARTERY BYPASS GRAFT  2010   Quadruple   DIRECT LARYNGOSCOPY  11/28/2020   Procedure: DIRECT LARYNGOSCOPY WITH BIOPSY;  Surgeon: Beverly Gust, MD;  Location: [facility];  Service: ENT;;   IR IMAGING GUIDED PORT INSERTION  01/01/2021   PULMONARY THROMBECTOMY Bilateral 01/13/2021   Procedure: PULMONARY THROMBECTOMY;  Surgeon: Katha Cabal, MD;  Location: [facility];  Service: Cardiovascular;  Laterality: Bilateral;   TRACHEOSTOMY TUBE PLACEMENT N/A 11/28/2020   Procedure: AWAKE TRACHEOSTOMY;  Surgeon: Beverly Gust, MD;  Location: [facility];  Service: ENT;  Laterality: N/A;    FAMILY HISTORY: Family History  Problem Relation Age of Onset   Arthritis Mother    Heart attack Father    Brain cancer Sister     ADVANCED DIRECTIVES (Y/N):  N  HEALTH MAINTENANCE: Social History   Tobacco Use   Smoking status: Former    Types: Cigarettes    Quit date: 10/17/2008    Years since quitting: 12.5   Smokeless tobacco: Never  Vaping Use   Vaping Use: Never used  Substance Use Topics   Alcohol use: Yes    Comment: occasionally   Drug use: Never     Colonoscopy:  PAP:  Bone density:  Lipid panel:  No Known Allergies  Current Outpatient Medications  Medication Sig Dispense Refill   amLODipine (NORVASC) 10 MG tablet Take 10 mg by mouth daily.     ascorbic acid (VITAMIN C) 500 MG tablet Take by  mouth.     cyanocobalamin 1000 MCG tablet Take by mouth.     DENTA 5000 PLUS 1.1 % CREA dental cream      hydrALAZINE (APRESOLINE) 50 MG tablet Take 50 mg by mouth 2 (two) times daily.     olmesartan (BENICAR) 40 MG tablet Take 40 mg by mouth daily.     rosuvastatin (CRESTOR) 40 MG tablet Take 40 mg by mouth daily.     ALPRAZolam (XANAX) 0.5 MG tablet Take  1 tablet (0.5 mg total) by mouth daily as needed for anxiety (Take 1 hour prior to radiation). (Patient not taking: Reported on 04/14/2021) 30 tablet 1   apixaban (ELIQUIS) 5 MG TABS tablet Take 1 tablet (5 mg total) by mouth 2 (two) times daily. (Patient not taking: Reported on 05/05/2021) 180 tablet 1   feeding supplement (ENSURE ENLIVE / ENSURE PLUS) LIQD Take 237 mLs by mouth 3 (three) times daily between meals. (Patient not taking: Reported on 03/24/2021) 21330 mL 0   fluticasone (FLONASE) 50 MCG/ACT nasal spray Place 1 spray into both nostrils daily. (Patient not taking: Reported on 03/24/2021) 15.8 mL 0   gentamicin ointment (GARAMYCIN) 0.1 % Apply topically. (Patient not taking: Reported on 04/14/2021)     ipratropium-albuterol (DUONEB) 0.5-2.5 (3) MG/3ML SOLN Take 3 mLs by nebulization every 6 (six) hours. (Patient not taking: Reported on 03/24/2021) 360 mL 0   ondansetron (ZOFRAN) 8 MG tablet Take 1 tablet (8 mg total) by mouth every 8 (eight) hours as needed for nausea or vomiting. (Patient not taking: Reported on 04/14/2021) 60 tablet 2   prochlorperazine (COMPAZINE) 10 MG tablet Take 1 tablet (10 mg total) by mouth every 6 (six) hours as needed for nausea or vomiting. (Patient not taking: Reported on 04/14/2021) 60 tablet 2   rivaroxaban (XARELTO) 20 MG TABS tablet Take 1 tablet (20 mg total) by mouth daily with supper. 30 tablet 2   No current facility-administered medications for this visit.   Facility-Administered Medications Ordered in Other Visits  Medication Dose Route Frequency Provider Last Rate Last Admin   heparin lock flush 100 UNIT/ML injection            heparin lock flush 100 unit/mL  500 Units Intravenous Once Grayland Ormond, Kathlene November, MD       heparin lock flush 100 unit/mL  500 Units Intracatheter Once PRN Lloyd Huger, MD       pembrolizumab [facility]) 200 mg in sodium chloride 0.9 % 50 mL chemo infusion  200 mg Intravenous Once Lloyd Huger, MD 116 mL/hr at  05/05/21 1507 200 mg at 05/05/21 1507   sodium chloride flush (NS) 0.9 % injection 10 mL  10 mL Intravenous PRN Lloyd Huger, MD   10 mL at 01/06/21 1305    OBJECTIVE: Vitals:   05/05/21 1417  BP: 122/75  Pulse: 81  Resp: 16  Temp: (!) 97.5 F (36.4 C)  SpO2: 99%      Body mass index is 31.47 kg/m.    ECOG FS:0 - Asymptomatic  General: Well-developed, well-nourished, no acute distress. Eyes: Pink conjunctiva, anicteric sclera. HEENT: Normocephalic, moist mucous membranes. Lungs: No audible wheezing or coughing. Heart: Regular rate and rhythm. Abdomen: Soft, nontender, no obvious distention. Musculoskeletal: No edema, cyanosis, or clubbing. Neuro: Alert, answering all questions appropriately. Cranial nerves grossly intact. Skin: No rashes or petechiae noted. Psych: Normal affect.   LAB RESULTS:  Lab Results  Component Value Date   NA 138 05/05/2021   K 3.7 05/05/2021  CL 103 05/05/2021   CO2 27 05/05/2021   GLUCOSE 91 05/05/2021   BUN 18 05/05/2021   CREATININE 1.28 (H) 05/05/2021   CALCIUM 9.2 05/05/2021   PROT 7.2 05/05/2021   ALBUMIN 4.1 05/05/2021   AST 19 05/05/2021   ALT 15 05/05/2021   ALKPHOS 47 05/05/2021   BILITOT 0.5 05/05/2021   GFRNONAA >60 05/05/2021   GFRAA 37 (L) 11/13/2019    Lab Results  Component Value Date   WBC 5.7 05/05/2021   NEUTROABS 4.0 05/05/2021   HGB 11.8 (L) 05/05/2021   HCT 36.0 (L) 05/05/2021   MCV 97.8 05/05/2021   PLT 192 05/05/2021     STUDIES: No results found.  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   PLAN:    1.  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  Both malignancies are p16 positive.  Patient had an emergency trach placed secondary to airway protection which now has been removed.  Repeat PET scan  results from March 23, 2021 reviewed independently with complete metabolic response in patient's neck as well as the 3 metastatic lesions in his lung.  Patient completed cycle 4 of carboplatinum, Taxol, and Keytruda on March 10, 2021.  Continue maintenance Keytruda every 3 weeks up to 24 months.  Proceed with treatment today.  Return to clinic in 3 weeks for treatment only and then in 6 weeks for further evaluation and consideration of cycle 9 Keytruda.   2.  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  PET scan results from November 12, 2019 reviewed independently with no obvious evidence of recurrent or progressive disease.  Level 2 lymph node is no longer hypermetabolic.  3.  Renal insufficiency: Creatinine improved to 1.28.  Monitor. 4.  Anemia: Nearly resolved.  Hemoglobin 11.8 today. 5.  Hypokalemia: Resolved. 6.  Pulmonary embolism: Diagnosed in September 2022.  Continue Eliquis as prescribed.  Patient will require a minimum of 6 months of treatment. 7.  Leukopenia: Resolved. 8.  Peripheral neuropathy: Mild, monitor.  Possibly related to Taxol.  Patient expressed understanding and was in agreement with this plan. He also understands that He can call clinic at any time with any questions, concerns, or complaints.    Cancer Staging  Malignant neoplasm of supraglottis [facility]) Staging form: Larynx - Supraglottis, AJCC 8th Edition - Clinical stage from 12/11/2020: Stage IVC (cT3, cN2b, cM1) - Signed by Lloyd Huger, MD on 01/26/2021  Primary squamous cell carcinoma of tonsil ([place]) Staging form: Pharynx - HPV-Mediated Oropharynx, AJCC 8th Edition - Clinical: No stage assigned - Unsigned   Lloyd Huger, MD   05/05/2021 3:16 PM

## 2021-05-05 ENCOUNTER — Inpatient Hospital Stay: Payer: Medicare Other | Attending: Oncology

## 2021-05-05 ENCOUNTER — Inpatient Hospital Stay: Payer: Medicare Other

## 2021-05-05 ENCOUNTER — Inpatient Hospital Stay (HOSPITAL_BASED_OUTPATIENT_CLINIC_OR_DEPARTMENT_OTHER): Payer: Medicare Other | Admitting: Oncology

## 2021-05-05 ENCOUNTER — Other Ambulatory Visit (HOSPITAL_COMMUNITY): Payer: Self-pay

## 2021-05-05 ENCOUNTER — Other Ambulatory Visit: Payer: Self-pay

## 2021-05-05 VITALS — BP 122/75 | HR 81 | Temp 97.5°F | Resp 16 | Wt 213.1 lb

## 2021-05-05 DIAGNOSIS — Z7901 Long term (current) use of anticoagulants: Secondary | ICD-10-CM | POA: Insufficient documentation

## 2021-05-05 DIAGNOSIS — Z87891 Personal history of nicotine dependence: Secondary | ICD-10-CM | POA: Insufficient documentation

## 2021-05-05 DIAGNOSIS — C321 Malignant neoplasm of supraglottis: Secondary | ICD-10-CM | POA: Insufficient documentation

## 2021-05-05 DIAGNOSIS — G629 Polyneuropathy, unspecified: Secondary | ICD-10-CM | POA: Diagnosis not present

## 2021-05-05 DIAGNOSIS — Z930 Tracheostomy status: Secondary | ICD-10-CM | POA: Diagnosis not present

## 2021-05-05 DIAGNOSIS — Z79899 Other long term (current) drug therapy: Secondary | ICD-10-CM | POA: Diagnosis not present

## 2021-05-05 DIAGNOSIS — D649 Anemia, unspecified: Secondary | ICD-10-CM | POA: Diagnosis not present

## 2021-05-05 DIAGNOSIS — Z5112 Encounter for antineoplastic immunotherapy: Secondary | ICD-10-CM | POA: Insufficient documentation

## 2021-05-05 DIAGNOSIS — I2699 Other pulmonary embolism without acute cor pulmonale: Secondary | ICD-10-CM | POA: Diagnosis not present

## 2021-05-05 DIAGNOSIS — N289 Disorder of kidney and ureter, unspecified: Secondary | ICD-10-CM | POA: Insufficient documentation

## 2021-05-05 LAB — CBC WITH DIFFERENTIAL/PLATELET
Abs Immature Granulocytes: 0 10*3/uL (ref 0.00–0.07)
Basophils Absolute: 0 10*3/uL (ref 0.0–0.1)
Basophils Relative: 1 %
Eosinophils Absolute: 0.2 10*3/uL (ref 0.0–0.5)
Eosinophils Relative: 4 %
HCT: 36 % — ABNORMAL LOW (ref 39.0–52.0)
Hemoglobin: 11.8 g/dL — ABNORMAL LOW (ref 13.0–17.0)
Immature Granulocytes: 0 %
Lymphocytes Relative: 16 %
Lymphs Abs: 0.9 10*3/uL (ref 0.7–4.0)
MCH: 32.1 pg (ref 26.0–34.0)
MCHC: 32.8 g/dL (ref 30.0–36.0)
MCV: 97.8 fL (ref 80.0–100.0)
Monocytes Absolute: 0.6 10*3/uL (ref 0.1–1.0)
Monocytes Relative: 11 %
Neutro Abs: 4 10*3/uL (ref 1.7–7.7)
Neutrophils Relative %: 68 %
Platelets: 192 10*3/uL (ref 150–400)
RBC: 3.68 MIL/uL — ABNORMAL LOW (ref 4.22–5.81)
RDW: 14 % (ref 11.5–15.5)
WBC: 5.7 10*3/uL (ref 4.0–10.5)
nRBC: 0 % (ref 0.0–0.2)

## 2021-05-05 LAB — COMPREHENSIVE METABOLIC PANEL
ALT: 15 U/L (ref 0–44)
AST: 19 U/L (ref 15–41)
Albumin: 4.1 g/dL (ref 3.5–5.0)
Alkaline Phosphatase: 47 U/L (ref 38–126)
Anion gap: 8 (ref 5–15)
BUN: 18 mg/dL (ref 8–23)
CO2: 27 mmol/L (ref 22–32)
Calcium: 9.2 mg/dL (ref 8.9–10.3)
Chloride: 103 mmol/L (ref 98–111)
Creatinine, Ser: 1.28 mg/dL — ABNORMAL HIGH (ref 0.61–1.24)
GFR, Estimated: >60 mL/min (ref >60)
Glucose, Bld: 91 mg/dL (ref 70–99)
Potassium: 3.7 mmol/L (ref 3.5–5.1)
Sodium: 138 mmol/L (ref 135–145)
Total Bilirubin: 0.5 mg/dL (ref 0.3–1.2)
Total Protein: 7.2 g/dL (ref 6.5–8.1)

## 2021-05-05 LAB — TSH: TSH: 4.031 u[IU]/mL (ref 0.350–4.500)

## 2021-05-05 MED ORDER — RIVAROXABAN 20 MG PO TABS: 20.0000 mg | ORAL_TABLET | Freq: Every day | ORAL | 2 refills | Status: DC

## 2021-05-05 MED ORDER — SODIUM CHLORIDE 0.9 % IV SOLN
Freq: Once | INTRAVENOUS | Status: AC
  Filled 2021-05-05: qty 250

## 2021-05-05 MED ORDER — HEPARIN SOD (PORK) LOCK FLUSH 100 UNIT/ML IV SOLN
500.0000 [IU] | Freq: Once | INTRAVENOUS | Status: AC | PRN
  Administered 2021-05-05: 500 [IU]
  Filled 2021-05-05: qty 5

## 2021-05-05 MED ORDER — SODIUM CHLORIDE 0.9 % IV SOLN
200.0000 mg | Freq: Once | INTRAVENOUS | Status: AC
  Administered 2021-05-05: 200 mg via INTRAVENOUS
  Filled 2021-05-05: qty 200

## 2021-05-05 NOTE — Patient Instructions (Signed)
[facility]  Discharge Instructions: °Thank you for choosing [facility] to provide your oncology and hematology care.  °If you have a lab appointment with the Cancer Center, please go directly to the Cancer Center and check in at the registration area. ° °Wear comfortable clothing and clothing appropriate for easy access to any Portacath or PICC line.  ° °We strive to give you quality time with your provider. You may need to reschedule your appointment if you arrive late (15 or more minutes).  Arriving late affects you and other patients whose appointments are after yours.  Also, if you miss three or more appointments without notifying the office, you may be dismissed from the clinic at the provider’s discretion.    °  °For prescription refill requests, have your pharmacy contact our office and allow 72 hours for refills to be completed.   ° °Today you received the following chemotherapy and/or immunotherapy agents: Keytruda    °  °To help prevent nausea and vomiting after your treatment, we encourage you to take your nausea medication as directed. ° °BELOW ARE SYMPTOMS THAT SHOULD BE REPORTED IMMEDIATELY: °*FEVER GREATER THAN 100.4 F (38 °C) OR HIGHER °*CHILLS OR SWEATING °*NAUSEA AND VOMITING THAT IS NOT CONTROLLED WITH YOUR NAUSEA MEDICATION °*UNUSUAL SHORTNESS OF BREATH °*UNUSUAL BRUISING OR BLEEDING °*URINARY PROBLEMS (pain or burning when urinating, or frequent urination) °*BOWEL PROBLEMS (unusual diarrhea, constipation, pain near the anus) °TENDERNESS IN MOUTH AND THROAT WITH OR WITHOUT PRESENCE OF ULCERS (sore throat, sores in mouth, or a toothache) °UNUSUAL RASH, SWELLING OR PAIN  °UNUSUAL VAGINAL DISCHARGE OR ITCHING  ° °Items with * indicate a potential emergency and should be followed up as soon as possible or go to the Emergency Department if any problems should occur. ° °Please show the CHEMOTHERAPY ALERT CARD or IMMUNOTHERAPY ALERT CARD at check-in to  the Emergency Department and triage nurse. ° °Should you have questions after your visit or need to cancel or reschedule your appointment, please contact [facility]  [phone] and follow the prompts.  Office hours are 8:00 a.m. to 4:30 p.m. Monday - Friday. Please note that voicemails left after 4:00 p.m. may not be returned until the following business day.  We are closed weekends and major holidays. You have access to a nurse at all times for urgent questions. Please call the main number to the clinic [phone] and follow the prompts. ° °For any non-urgent questions, you may also contact your provider using MyChart. We now offer e-Visits for anyone 18 and older to request care online for non-urgent symptoms. For details visit [email or web address]. °  °Also download the MyChart app! Go to the app store, search "MyChart", open the app, select [facility], and log in with your MyChart username and password. ° °Due to Covid, a mask is required upon entering the hospital/clinic. If you do not have a mask, one will be given to you upon arrival. For doctor visits, patients may have 1 support person aged 18 or older with them. For treatment visits, patients cannot have anyone with them due to current Covid guidelines and our immunocompromised population. Pembrolizumab injection °What is this medication? °PEMBROLIZUMAB (pem broe liz ue mab) is a monoclonal antibody. It is used to treat certain types of cancer. °This medicine may be used for other purposes; ask your health care provider or pharmacist if you have questions. °COMMON BRAND NAME(S): Keytruda °What should I tell my care team before I   take this medication? °They need to know if you have any of these conditions: °autoimmune diseases like Crohn's disease, ulcerative colitis, or lupus °have had or planning to have an allogeneic stem cell transplant (uses someone else's stem cells) °history of organ transplant °history of  chest radiation °nervous system problems like myasthenia gravis or Guillain-Barre syndrome °an unusual or allergic reaction to pembrolizumab, other medicines, foods, dyes, or preservatives °pregnant or trying to get pregnant °breast-feeding °How should I use this medication? °This medicine is for infusion into a vein. It is given by a health care professional in a hospital or clinic setting. °A special MedGuide will be given to you before each treatment. Be sure to read this information carefully each time. °Talk to your pediatrician regarding the use of this medicine in children. While this drug may be prescribed for children as young as 6 months for selected conditions, precautions do apply. °Overdosage: If you think you have taken too much of this medicine contact a poison control center or emergency room at once. °NOTE: This medicine is only for you. Do not share this medicine with others. °What if I miss a dose? °It is important not to miss your dose. Call your doctor or health care professional if you are unable to keep an appointment. °What may interact with this medication? °Interactions have not been studied. °This list may not describe all possible interactions. Give your health care provider a list of all the medicines, herbs, non-prescription drugs, or dietary supplements you use. Also tell them if you smoke, drink alcohol, or use illegal drugs. Some items may interact with your medicine. °What should I watch for while using this medication? °Your condition will be monitored carefully while you are receiving this medicine. °You may need blood work done while you are taking this medicine. °Do not become pregnant while taking this medicine or for 4 months after stopping it. Women should inform their doctor if they wish to become pregnant or think they might be pregnant. There is a potential for serious side effects to an unborn child. Talk to your health care professional or pharmacist for more  information. Do not breast-feed an infant while taking this medicine or for 4 months after the last dose. °What side effects may I notice from receiving this medication? °Side effects that you should report to your doctor or health care professional as soon as possible: °allergic reactions like skin rash, itching or hives, swelling of the face, lips, or tongue °bloody or black, tarry °breathing problems °changes in vision °chest pain °chills °confusion °constipation °cough °diarrhea °dizziness or feeling faint or lightheaded °fast or irregular heartbeat °fever °flushing °joint pain °low blood counts - this medicine may decrease the number of white blood cells, red blood cells and platelets. You may be at increased risk for infections and bleeding. °muscle pain °muscle weakness °pain, tingling, numbness in the hands or feet °persistent headache °redness, blistering, peeling or loosening of the skin, including inside the mouth °signs and symptoms of high blood sugar such as dizziness; dry mouth; dry skin; fruity breath; nausea; stomach pain; increased hunger or thirst; increased urination °signs and symptoms of kidney injury like trouble passing urine or change in the amount of urine °signs and symptoms of liver injury like dark urine, light-colored stools, loss of appetite, nausea, right upper belly pain, yellowing of the eyes or skin °sweating °swollen lymph nodes °weight loss °Side effects that usually do not require medical attention (report to your doctor or health care   professional if they continue or are bothersome): °decreased appetite °hair loss °tiredness °This list may not describe all possible side effects. Call your doctor for medical advice about side effects. You may report side effects to FDA at 1-800-FDA-1088. °Where should I keep my medication? °This drug is given in a hospital or clinic and will not be stored at home. °NOTE: This sheet is a summary. It may not cover all possible information. If you  have questions about this medicine, talk to your doctor, pharmacist, or health care provider. °© 2022 Elsevier/Gold Standard (2020-12-23 00:00:00) ° °

## 2021-05-05 NOTE — Progress Notes (Signed)
Pt requesting refill of xarelto. Also reports tingling in both feet x 2-3 weeks that seemed to start after coming off of chemo drugs. Pt states "It just doesn't feel right." Pt had trach removed last week. Post-op f/u scheduled with Dr. Tami Ribas tomorrow.

## 2021-05-05 NOTE — Progress Notes (Signed)
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  S/p emergent trach on 8/12.  Patient had trach removed about 1 week ago.  Currently receiving [place].  Met with patient during infusion.  Patient reports that he is eating a variety of foods (fruits, vegetables, meats).  Appetite is good.  Denies any nutrition impact symptoms at this time.    Medications: reviewed  Labs: reviewed  Anthropometrics:   Weight 213 lb 1.6 oz today  212 lb on 12/6 209 lb 3.2 oz on 11/15 210 lb 6.4 oz on 10/11 198 lb on 10/4 205 lb on 9/20 217 lb on 05/20/20   NUTRITION DIAGNOSIS: Inadequate oral intake improved   INTERVENTION:  Patient to continue eating well balanced diet including good sources of protein.  Patient to call RD if changes in nutrition occur.      MONITORING, EVALUATION, GOAL: weight trends, intake   NEXT VISIT: no follow-up at this time RD available as needed  Karron Goens B. Zenia Resides, [place], [place] Registered Dietitian [phone] (mobile)

## 2021-05-06 LAB — T4: T4, Total: 6.7 ug/dL (ref 4.5–12.0)

## 2021-05-18 ENCOUNTER — Telehealth: Payer: Self-pay | Admitting: *Deleted

## 2021-05-18 NOTE — Telephone Encounter (Signed)
Patient would like to know if there is an alternative to Eliquis or Xeralto. his first prescription of either drug is going to cost $500.00. After the initial fill it will come down to $146.00 a month. He would like to avoid the $500.00 if clinically feasible.

## 2021-05-20 ENCOUNTER — Ambulatory Visit
Admission: RE | Admit: 2021-05-20 | Discharge: 2021-05-20 | Disposition: A | Discharge status: Home / Self Care | Payer: Medicare Other | Source: Ambulatory Visit | Attending: Radiation Oncology | Admitting: Radiation Oncology

## 2021-05-20 ENCOUNTER — Encounter: Payer: Self-pay | Admitting: Radiation Oncology

## 2021-05-20 ENCOUNTER — Other Ambulatory Visit: Payer: Self-pay

## 2021-05-20 VITALS — BP 135/81 | HR 74 | Temp 98.0°F | Resp 18

## 2021-05-20 DIAGNOSIS — Z85810 Personal history of malignant neoplasm of tongue: Secondary | ICD-10-CM | POA: Diagnosis present

## 2021-05-20 DIAGNOSIS — Z85819 Personal history of malignant neoplasm of unspecified site of lip, oral cavity, and pharynx: Secondary | ICD-10-CM | POA: Diagnosis not present

## 2021-05-20 DIAGNOSIS — R918 Other nonspecific abnormal finding of lung field: Secondary | ICD-10-CM | POA: Insufficient documentation

## 2021-05-20 DIAGNOSIS — C760 Malignant neoplasm of head, face and neck: Secondary | ICD-10-CM

## 2021-05-20 NOTE — Progress Notes (Signed)
Radiation Oncology Follow up Note  Name: Scott Harding   Date:   05/20/2021 MRN:  [id number] DOB: 03/02/1956    This 66 y.o. 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  Seen today in routine follow-up he is doing well he just had a revision of his tracheostomy still has a little soreness in that region.  No specific dysphagia or other head and neck pain.  He recently had a PET CT scan.  Showing complete metabolic response to treatment with a large hypopharyngeal tonsillar mass completely resolved as well as the neck adenopathy and metastatic pulmonary nodules.  COMPLICATIONS OF TREATMENT: none  FOLLOW UP COMPLIANCE: keeps appointments   PHYSICAL EXAM:  BP 135/81    Pulse 74    Temp 98 F (36.7 C)    Resp 18  No evidence of cervical or supraclavicular adenopathy.  Has had revision of his tracheostomy.  Well-developed well-nourished patient in NAD. HEENT reveals PERLA, EOMI, discs not visualized.  Oral cavity is clear. No oral mucosal lesions are identified. Neck is clear without evidence of cervical or supraclavicular adenopathy. Lungs are clear to A&P. Cardiac examination is essentially unremarkable with regular rate and rhythm without murmur rub or thrill. Abdomen is benign with no organomegaly or masses noted. Motor sensory and DTR levels are equal and symmetric in the upper and lower extremities. Cranial nerves II through XII are grossly intact. Proprioception is intact. No peripheral adenopathy or edema is identified. No motor or sensory levels are noted. Crude visual fields are within normal range.  RADIOLOGY  RESULTS: PET CT scan reviewed compatible with above-stated findings  PLAN: Present time patient is doing well 2 years out with no evidence of disease by PET/CT criteria and pleased with his overall progress.  I have asked to see him back in 1 year for follow-up.  Continues close follow-up care with ENT with regular inspection.  Patient is to call with any concerns.  I would like to take this opportunity to thank you for allowing me to participate in the care of your patient.Noreene Filbert, MD

## 2021-05-26 ENCOUNTER — Inpatient Hospital Stay: Payer: Medicare Other | Attending: Oncology

## 2021-05-26 ENCOUNTER — Other Ambulatory Visit: Payer: Self-pay

## 2021-05-26 VITALS — BP 112/46 | HR 64 | Temp 97.9°F | Resp 16 | Wt 210.3 lb

## 2021-05-26 DIAGNOSIS — C321 Malignant neoplasm of supraglottis: Secondary | ICD-10-CM | POA: Diagnosis present

## 2021-05-26 DIAGNOSIS — Z5112 Encounter for antineoplastic immunotherapy: Secondary | ICD-10-CM | POA: Insufficient documentation

## 2021-05-26 DIAGNOSIS — D649 Anemia, unspecified: Secondary | ICD-10-CM | POA: Diagnosis not present

## 2021-05-26 DIAGNOSIS — I2699 Other pulmonary embolism without acute cor pulmonale: Secondary | ICD-10-CM | POA: Insufficient documentation

## 2021-05-26 DIAGNOSIS — Z87891 Personal history of nicotine dependence: Secondary | ICD-10-CM | POA: Insufficient documentation

## 2021-05-26 DIAGNOSIS — Z79899 Other long term (current) drug therapy: Secondary | ICD-10-CM | POA: Insufficient documentation

## 2021-05-26 DIAGNOSIS — N289 Disorder of kidney and ureter, unspecified: Secondary | ICD-10-CM | POA: Insufficient documentation

## 2021-05-26 DIAGNOSIS — G629 Polyneuropathy, unspecified: Secondary | ICD-10-CM | POA: Insufficient documentation

## 2021-05-26 DIAGNOSIS — Z930 Tracheostomy status: Secondary | ICD-10-CM | POA: Insufficient documentation

## 2021-05-26 DIAGNOSIS — Z7901 Long term (current) use of anticoagulants: Secondary | ICD-10-CM | POA: Insufficient documentation

## 2021-05-26 MED ORDER — HEPARIN SOD (PORK) LOCK FLUSH 100 UNIT/ML IV SOLN
500.0000 [IU] | Freq: Once | INTRAVENOUS | Status: AC | PRN
  Filled 2021-05-26: qty 5

## 2021-05-26 MED ORDER — SODIUM CHLORIDE 0.9 % IV SOLN
Freq: Once | INTRAVENOUS | Status: AC
  Filled 2021-05-26: qty 250

## 2021-05-26 MED ORDER — HEPARIN SOD (PORK) LOCK FLUSH 100 UNIT/ML IV SOLN
INTRAVENOUS | Status: AC
  Administered 2021-05-26: 500 [IU]
  Filled 2021-05-26: qty 5

## 2021-05-26 MED ORDER — SODIUM CHLORIDE 0.9 % IV SOLN
200.0000 mg | Freq: Once | INTRAVENOUS | Status: AC
  Administered 2021-05-26: 200 mg via INTRAVENOUS
  Filled 2021-05-26: qty 200

## 2021-05-26 MED ORDER — SODIUM CHLORIDE 0.9% FLUSH
10.0000 mL | INTRAVENOUS | Status: DC | PRN
  Filled 2021-05-26: qty 10

## 2021-05-26 NOTE — Patient Instructions (Signed)
[facility]  Discharge Instructions: °Thank you for choosing [facility] to provide your oncology and hematology care.  °If you have a lab appointment with the Cancer Center, please go directly to the Cancer Center and check in at the registration area. ° °Wear comfortable clothing and clothing appropriate for easy access to any Portacath or PICC line.  ° °We strive to give you quality time with your provider. You may need to reschedule your appointment if you arrive late (15 or more minutes).  Arriving late affects you and other patients whose appointments are after yours.  Also, if you miss three or more appointments without notifying the office, you may be dismissed from the clinic at the provider’s discretion.    °  °For prescription refill requests, have your pharmacy contact our office and allow 72 hours for refills to be completed.   ° °Today you received the following chemotherapy and/or immunotherapy agents - pembrolizumab    °  °To help prevent nausea and vomiting after your treatment, we encourage you to take your nausea medication as directed. ° °BELOW ARE SYMPTOMS THAT SHOULD BE REPORTED IMMEDIATELY: °*FEVER GREATER THAN 100.4 F (38 °C) OR HIGHER °*CHILLS OR SWEATING °*NAUSEA AND VOMITING THAT IS NOT CONTROLLED WITH YOUR NAUSEA MEDICATION °*UNUSUAL SHORTNESS OF BREATH °*UNUSUAL BRUISING OR BLEEDING °*URINARY PROBLEMS (pain or burning when urinating, or frequent urination) °*BOWEL PROBLEMS (unusual diarrhea, constipation, pain near the anus) °TENDERNESS IN MOUTH AND THROAT WITH OR WITHOUT PRESENCE OF ULCERS (sore throat, sores in mouth, or a toothache) °UNUSUAL RASH, SWELLING OR PAIN  °UNUSUAL VAGINAL DISCHARGE OR ITCHING  ° °Items with * indicate a potential emergency and should be followed up as soon as possible or go to the Emergency Department if any problems should occur. ° °Please show the CHEMOTHERAPY ALERT CARD or IMMUNOTHERAPY ALERT CARD at  check-in to the Emergency Department and triage nurse. ° °Should you have questions after your visit or need to cancel or reschedule your appointment, please contact [facility]  [phone] and follow the prompts.  Office hours are 8:00 a.m. to 4:30 p.m. Monday - Friday. Please note that voicemails left after 4:00 p.m. may not be returned until the following business day.  We are closed weekends and major holidays. You have access to a nurse at all times for urgent questions. Please call the main number to the clinic [phone] and follow the prompts. ° °For any non-urgent questions, you may also contact your provider using MyChart. We now offer e-Visits for anyone 18 and older to request care online for non-urgent symptoms. For details visit [email or web address]. °  °Also download the MyChart app! Go to the app store, search "MyChart", open the app, select [facility], and log in with your MyChart username and password. ° °Due to Covid, a mask is required upon entering the hospital/clinic. If you do not have a mask, one will be given to you upon arrival. For doctor visits, patients may have 1 support person aged 18 or older with them. For treatment visits, patients cannot have anyone with them due to current Covid guidelines and our immunocompromised population.  ° °Pembrolizumab injection °What is this medication? °PEMBROLIZUMAB (pem broe liz ue mab) is a monoclonal antibody. It is used to treat certain types of cancer. °This medicine may be used for other purposes; ask your health care provider or pharmacist if you have questions. °COMMON BRAND NAME(S): Keytruda °What should I tell my care   team before I take this medication? °They need to know if you have any of these conditions: °autoimmune diseases like Crohn's disease, ulcerative colitis, or lupus °have had or planning to have an allogeneic stem cell transplant (uses someone else's stem cells) °history of organ  transplant °history of chest radiation °nervous system problems like myasthenia gravis or Guillain-Barre syndrome °an unusual or allergic reaction to pembrolizumab, other medicines, foods, dyes, or preservatives °pregnant or trying to get pregnant °breast-feeding °How should I use this medication? °This medicine is for infusion into a vein. It is given by a health care professional in a hospital or clinic setting. °A special MedGuide will be given to you before each treatment. Be sure to read this information carefully each time. °Talk to your pediatrician regarding the use of this medicine in children. While this drug may be prescribed for children as young as 6 months for selected conditions, precautions do apply. °Overdosage: If you think you have taken too much of this medicine contact a poison control center or emergency room at once. °NOTE: This medicine is only for you. Do not share this medicine with others. °What if I miss a dose? °It is important not to miss your dose. Call your doctor or health care professional if you are unable to keep an appointment. °What may interact with this medication? °Interactions have not been studied. °This list may not describe all possible interactions. Give your health care provider a list of all the medicines, herbs, non-prescription drugs, or dietary supplements you use. Also tell them if you smoke, drink alcohol, or use illegal drugs. Some items may interact with your medicine. °What should I watch for while using this medication? °Your condition will be monitored carefully while you are receiving this medicine. °You may need blood work done while you are taking this medicine. °Do not become pregnant while taking this medicine or for 4 months after stopping it. Women should inform their doctor if they wish to become pregnant or think they might be pregnant. There is a potential for serious side effects to an unborn child. Talk to your health care professional or  pharmacist for more information. Do not breast-feed an infant while taking this medicine or for 4 months after the last dose. °What side effects may I notice from receiving this medication? °Side effects that you should report to your doctor or health care professional as soon as possible: °allergic reactions like skin rash, itching or hives, swelling of the face, lips, or tongue °bloody or black, tarry °breathing problems °changes in vision °chest pain °chills °confusion °constipation °cough °diarrhea °dizziness or feeling faint or lightheaded °fast or irregular heartbeat °fever °flushing °joint pain °low blood counts - this medicine may decrease the number of white blood cells, red blood cells and platelets. You may be at increased risk for infections and bleeding. °muscle pain °muscle weakness °pain, tingling, numbness in the hands or feet °persistent headache °redness, blistering, peeling or loosening of the skin, including inside the mouth °signs and symptoms of high blood sugar such as dizziness; dry mouth; dry skin; fruity breath; nausea; stomach pain; increased hunger or thirst; increased urination °signs and symptoms of kidney injury like trouble passing urine or change in the amount of urine °signs and symptoms of liver injury like dark urine, light-colored stools, loss of appetite, nausea, right upper belly pain, yellowing of the eyes or skin °sweating °swollen lymph nodes °weight loss °Side effects that usually do not require medical attention (report to your doctor   or health care professional if they continue or are bothersome): °decreased appetite °hair loss °tiredness °This list may not describe all possible side effects. Call your doctor for medical advice about side effects. You may report side effects to FDA at 1-800-FDA-1088. °Where should I keep my medication? °This drug is given in a hospital or clinic and will not be stored at home. °NOTE: This sheet is a summary. It may not cover all possible  information. If you have questions about this medicine, talk to your doctor, pharmacist, or health care provider. °© 2022 [employer] Standard (2020-12-23 00:00:00) ° °

## 2021-06-03 ENCOUNTER — Encounter: Payer: Self-pay | Admitting: Oncology

## 2021-06-12 NOTE — Progress Notes (Signed)
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  INTERVAL HISTORY: Patient returns to clinic today for further evaluation and continuation of maintenance Keytruda.  He currently feels well and is asymptomatic.  He is tolerating his treatment well without significant side effects. He does admit to a mild peripheral neuropathy in his feet that does not affect his day-to-day activity.  He has no other neurologic complaints.  He denies any pain.  He denies any recent fevers or illnesses. He has no chest pain, shortness of breath, cough, or hemoptysis.  He denies any nausea, vomiting, constipation, or diarrhea.  He has no urinary complaints.  Patient offers no further specific complaints today.  REVIEW OF SYSTEMS:   Review of Systems  Constitutional: Negative.  Negative for fever, malaise/fatigue and weight loss.  Respiratory: Negative.  Negative for cough, hemoptysis and shortness of breath.   Cardiovascular: Negative.  Negative for chest pain and leg swelling.  Gastrointestinal: Negative.  Negative for abdominal pain.  Genitourinary: Negative.  Negative for dysuria.  Musculoskeletal: Negative.  Negative for neck pain.  Skin: Negative.  Negative for rash.  Neurological:  Positive for tingling. Negative for dizziness, focal weakness, weakness and headaches.  Psychiatric/Behavioral: Negative.  The patient is not nervous/anxious.    As per HPI. Otherwise, a complete review of systems is negative.  PAST  MEDICAL HISTORY: Past Medical History:  Diagnosis Date   Cancer [facility])    Coronary artery disease    Hypertension     PAST SURGICAL HISTORY: Past Surgical History:  Procedure Laterality Date   CORONARY ARTERY BYPASS GRAFT  2010   Quadruple   DIRECT LARYNGOSCOPY  11/28/2020   Procedure: DIRECT LARYNGOSCOPY WITH BIOPSY;  Surgeon: Beverly Gust, MD;  Location: [facility] ORS;  Service: ENT;;   IR IMAGING GUIDED PORT INSERTION  01/01/2021   PULMONARY THROMBECTOMY Bilateral 01/13/2021   Procedure: PULMONARY THROMBECTOMY;  Surgeon: Katha Cabal, MD;  Location: [facility];  Service: Cardiovascular;  Laterality: Bilateral;   TRACHEOSTOMY TUBE PLACEMENT N/A 11/28/2020   Procedure: AWAKE TRACHEOSTOMY;  Surgeon: Beverly Gust, MD;  Location: [facility] OR[facility];  Service: ENT;  Laterality: N/A;    FAMILY HISTORY: Family History  Problem Relation Age of Onset   Arthritis Mother    Heart attack Father    Brain cancer Sister     ADVANCED DIRECTIVES (Y/N):  N  HEALTH MAINTENANCE: Social History   Tobacco Use   Smoking status: Former    Types: Cigarettes    Quit date: 10/17/2008    Years since quitting: 12.6   Smokeless tobacco: Never  Vaping Use   Vaping Use: Never used  Substance Use Topics   Alcohol use: Yes    Comment: occasionally   Drug use: Never     Colonoscopy:  PAP:  Bone density:  Lipid panel:  No Known Allergies  Current Outpatient Medications  Medication Sig Dispense Refill   amLODipine (NORVASC) 10 MG tablet Take 10 mg by mouth daily.     ascorbic acid (VITAMIN C) 500 MG tablet Take by mouth.     cyanocobalamin  1000 MCG tablet Take by mouth.     DENTA 5000 PLUS 1.1 % CREA dental cream      hydrALAZINE (APRESOLINE) 50 MG tablet Take 50 mg by mouth 2 (two) times daily.     olmesartan (BENICAR) 40 MG tablet Take 40 mg by mouth daily.     rivaroxaban (XARELTO) 20 MG TABS tablet Take 1 tablet (20 mg total) by mouth daily with supper. 30 tablet 2   rosuvastatin  (CRESTOR) 40 MG tablet Take 40 mg by mouth daily.     ALPRAZolam (XANAX) 0.5 MG tablet Take 1 tablet (0.5 mg total) by mouth daily as needed for anxiety (Take 1 hour prior to radiation). (Patient not taking: Reported on 04/14/2021) 30 tablet 1   feeding supplement (ENSURE ENLIVE / ENSURE PLUS) LIQD Take 237 mLs by mouth 3 (three) times daily between meals. (Patient not taking: Reported on 03/24/2021) 21330 mL 0   fluticasone (FLONASE) 50 MCG/ACT nasal spray Place 1 spray into both nostrils daily. (Patient not taking: Reported on 03/24/2021) 15.8 mL 0   gentamicin ointment (GARAMYCIN) 0.1 % Apply topically. (Patient not taking: Reported on 04/14/2021)     ipratropium-albuterol (DUONEB) 0.5-2.5 (3) MG/3ML SOLN Take 3 mLs by nebulization every 6 (six) hours. (Patient not taking: Reported on 03/24/2021) 360 mL 0   ondansetron (ZOFRAN) 8 MG tablet Take 1 tablet (8 mg total) by mouth every 8 (eight) hours as needed for nausea or vomiting. (Patient not taking: Reported on 04/14/2021) 60 tablet 2   prochlorperazine (COMPAZINE) 10 MG tablet Take 1 tablet (10 mg total) by mouth every 6 (six) hours as needed for nausea or vomiting. (Patient not taking: Reported on 04/14/2021) 60 tablet 2   No current facility-administered medications for this visit.   Facility-Administered Medications Ordered in Other Visits  Medication Dose Route Frequency Provider Last Rate Last Admin   0.9 %  sodium chloride infusion   Intravenous Once Grayland Ormond, Kathlene November, MD       heparin lock flush 100 UNIT/ML injection            heparin lock flush 100 unit/mL  500 Units Intravenous Once Grayland Ormond, Kathlene November, MD       heparin lock flush 100 unit/mL  500 Units Intracatheter Once PRN Lloyd Huger, MD       pembrolizumab [facility]) 200 mg in sodium chloride 0.9 % 50 mL chemo infusion  200 mg Intravenous Once Lloyd Huger, MD       sodium chloride flush (NS) 0.9 % injection 10 mL  10 mL Intravenous PRN Lloyd Huger, MD   10  mL at 01/06/21 1305   sodium chloride flush (NS) 0.9 % injection 10 mL  10 mL Intracatheter PRN Lloyd Huger, MD        OBJECTIVE: Vitals:   06/16/21 1332  BP: 119/76  Pulse: 62  Resp: 16  Temp: 97.8 F (36.6 C)  SpO2: 98%      Body mass index is 31.23 kg/m.    ECOG FS:0 - Asymptomatic  General: Well-developed, well-nourished, no acute distress. Eyes: Pink conjunctiva, anicteric sclera. HEENT: Normocephalic, moist mucous membranes. Lungs: No audible wheezing or coughing. Heart: Regular rate and rhythm. Abdomen: Soft, nontender, no obvious distention. Musculoskeletal: No edema, cyanosis, or clubbing. Neuro: Alert, answering all questions appropriately. Cranial nerves grossly intact. Skin: No rashes or petechiae noted. Psych: Normal affect.   LAB RESULTS:  Lab Results  Component Value Date   NA 138 06/16/2021   K  3.8 06/16/2021   CL 104 06/16/2021   CO2 27 06/16/2021   GLUCOSE 107 (H) 06/16/2021   BUN 19 06/16/2021   CREATININE 1.28 (H) 06/16/2021   CALCIUM 9.2 06/16/2021   PROT 7.4 06/16/2021   ALBUMIN 4.1 06/16/2021   AST 21 06/16/2021   ALT 17 06/16/2021   ALKPHOS 44 06/16/2021   BILITOT <0.1 (L) 06/16/2021   GFRNONAA >60 06/16/2021   GFRAA 37 (L) 11/13/2019    Lab Results  Component Value Date   WBC 5.5 06/16/2021   NEUTROABS 3.7 06/16/2021   HGB 12.6 (L) 06/16/2021   HCT 39.0 06/16/2021   MCV 94.7 06/16/2021   PLT 184 06/16/2021     STUDIES: No results found.  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   PLAN:    1.  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  Both malignancies are p16 positive.  Patient had an emergency trach placed secondary to airway protection which now has been removed.  Repeat PET scan results from March 23, 2021  reviewed independently with complete metabolic response in patient's neck as well as the 3 metastatic lesions in his lung.  Patient completed cycle 4 of carboplatinum, Taxol, and Keytruda on March 10, 2021.  Continue maintenance Keytruda every 3 weeks up to 24 months.  Proceed with treatment today.  Return to clinic in 3 weeks for treatment only and then in 6 weeks for further evaluation and consideration of cycle 11 of Keytruda.  Will reimage with CT scan in approximately June 2023.   2.  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  PET scan results from November 12, 2019 reviewed independently with no obvious evidence of recurrent or progressive disease.  Level 2 lymph node is no longer hypermetabolic.  3.  Renal insufficiency: Chronic and unchanged.  Patient's creatinine is 1.28. 4.  Anemia: Resolved. 5.  Hypokalemia: Resolved. 6.  Pulmonary embolism: Diagnosed in September 2022.  Continue Eliquis as prescribed.  Patient will require a minimum of 6 months of treatment. 7.  Leukopenia: Resolved. 8.  Peripheral neuropathy: Chronic and unchanged.  Possibly related to Taxol.  Patient expressed understanding and was in agreement with this plan. He also understands that He can call clinic at any time with any questions, concerns, or complaints.    Cancer Staging  Malignant neoplasm of supraglottis [facility]) Staging form: Larynx - Supraglottis, AJCC 8th Edition - Clinical stage from 12/11/2020: Stage IVC (cT3, cN2b, cM1) - Signed by Lloyd Huger, MD on 01/26/2021  Primary squamous cell carcinoma of tonsil ([place]) Staging form: Pharynx - HPV-Mediated Oropharynx, AJCC 8th Edition - Clinical: No stage assigned - Unsigned   Lloyd Huger, MD   06/16/2021 1:49 PM

## 2021-06-16 ENCOUNTER — Inpatient Hospital Stay: Payer: Medicare Other

## 2021-06-16 ENCOUNTER — Other Ambulatory Visit: Payer: Self-pay

## 2021-06-16 ENCOUNTER — Inpatient Hospital Stay (HOSPITAL_BASED_OUTPATIENT_CLINIC_OR_DEPARTMENT_OTHER): Payer: Medicare Other | Admitting: Oncology

## 2021-06-16 VITALS — BP 119/76 | HR 62 | Temp 97.8°F | Resp 16 | Wt 211.5 lb

## 2021-06-16 DIAGNOSIS — C321 Malignant neoplasm of supraglottis: Secondary | ICD-10-CM | POA: Diagnosis not present

## 2021-06-16 DIAGNOSIS — Z5112 Encounter for antineoplastic immunotherapy: Secondary | ICD-10-CM | POA: Diagnosis not present

## 2021-06-16 LAB — CBC WITH DIFFERENTIAL/PLATELET
Abs Immature Granulocytes: 0.01 10*3/uL (ref 0.00–0.07)
Basophils Absolute: 0 10*3/uL (ref 0.0–0.1)
Basophils Relative: 1 %
Eosinophils Absolute: 0.3 10*3/uL (ref 0.0–0.5)
Eosinophils Relative: 5 %
HCT: 39 % (ref 39.0–52.0)
Hemoglobin: 12.6 g/dL — ABNORMAL LOW (ref 13.0–17.0)
Immature Granulocytes: 0 %
Lymphocytes Relative: 16 %
Lymphs Abs: 0.9 10*3/uL (ref 0.7–4.0)
MCH: 30.6 pg (ref 26.0–34.0)
MCHC: 32.3 g/dL (ref 30.0–36.0)
MCV: 94.7 fL (ref 80.0–100.0)
Monocytes Absolute: 0.6 10*3/uL (ref 0.1–1.0)
Monocytes Relative: 12 %
Neutro Abs: 3.7 10*3/uL (ref 1.7–7.7)
Neutrophils Relative %: 66 %
Platelets: 184 10*3/uL (ref 150–400)
RBC: 4.12 MIL/uL — ABNORMAL LOW (ref 4.22–5.81)
RDW: 13.6 % (ref 11.5–15.5)
WBC: 5.5 10*3/uL (ref 4.0–10.5)
nRBC: 0 % (ref 0.0–0.2)

## 2021-06-16 LAB — COMPREHENSIVE METABOLIC PANEL
ALT: 17 U/L (ref 0–44)
AST: 21 U/L (ref 15–41)
Albumin: 4.1 g/dL (ref 3.5–5.0)
Alkaline Phosphatase: 44 U/L (ref 38–126)
Anion gap: 7 (ref 5–15)
BUN: 19 mg/dL (ref 8–23)
CO2: 27 mmol/L (ref 22–32)
Calcium: 9.2 mg/dL (ref 8.9–10.3)
Chloride: 104 mmol/L (ref 98–111)
Creatinine, Ser: 1.28 mg/dL — ABNORMAL HIGH (ref 0.61–1.24)
GFR, Estimated: >60 mL/min (ref >60)
Glucose, Bld: 107 mg/dL — ABNORMAL HIGH (ref 70–99)
Potassium: 3.8 mmol/L (ref 3.5–5.1)
Sodium: 138 mmol/L (ref 135–145)
Total Bilirubin: <0.1 mg/dL — ABNORMAL LOW (ref 0.3–1.2)
Total Protein: 7.4 g/dL (ref 6.5–8.1)

## 2021-06-16 LAB — TSH: TSH: 3.705 u[IU]/mL (ref 0.350–4.500)

## 2021-06-16 MED ORDER — HEPARIN SOD (PORK) LOCK FLUSH 100 UNIT/ML IV SOLN
INTRAVENOUS | Status: AC
  Filled 2021-06-16: qty 5

## 2021-06-16 MED ORDER — HEPARIN SOD (PORK) LOCK FLUSH 100 UNIT/ML IV SOLN
500.0000 [IU] | Freq: Once | INTRAVENOUS | Status: AC | PRN
  Administered 2021-06-16: 500 [IU]
  Filled 2021-06-16: qty 5

## 2021-06-16 MED ORDER — SODIUM CHLORIDE 0.9 % IV SOLN
Freq: Once | INTRAVENOUS | Status: AC
  Filled 2021-06-16: qty 250

## 2021-06-16 MED ORDER — SODIUM CHLORIDE 0.9% FLUSH
10.0000 mL | INTRAVENOUS | Status: DC | PRN
  Filled 2021-06-16: qty 10

## 2021-06-16 MED ORDER — SODIUM CHLORIDE 0.9 % IV SOLN
200.0000 mg | Freq: Once | INTRAVENOUS | Status: AC
  Administered 2021-06-16: 200 mg via INTRAVENOUS
  Filled 2021-06-16: qty 200

## 2021-06-16 NOTE — Patient Instructions (Signed)
[facility] [place]  Discharge Instructions: °Thank you for choosing [facility] to provide your oncology and hematology care.  °If you have a lab appointment with the Cancer Center, please go directly to the Cancer Center and check in at the registration area. ° °Wear comfortable clothing and clothing appropriate for easy access to any Portacath or PICC line.  ° °We strive to give you quality time with your provider. You may need to reschedule your appointment if you arrive late (15 or more minutes).  Arriving late affects you and other patients whose appointments are after yours.  Also, if you miss three or more appointments without notifying the office, you may be dismissed from the clinic at the provider’s discretion.    °  °For prescription refill requests, have your pharmacy contact our office and allow 72 hours for refills to be completed.   ° °Today you received the following chemotherapy and/or immunotherapy agents - pembrolizumab    °  °To help prevent nausea and vomiting after your treatment, we encourage you to take your nausea medication as directed. ° °BELOW ARE SYMPTOMS THAT SHOULD BE REPORTED IMMEDIATELY: °*FEVER GREATER THAN 100.4 F (38 °C) OR HIGHER °*CHILLS OR SWEATING °*NAUSEA AND VOMITING THAT IS NOT CONTROLLED WITH YOUR NAUSEA MEDICATION °*UNUSUAL SHORTNESS OF BREATH °*UNUSUAL BRUISING OR BLEEDING °*URINARY PROBLEMS (pain or burning when urinating, or frequent urination) °*BOWEL PROBLEMS (unusual diarrhea, constipation, pain near the anus) °TENDERNESS IN MOUTH AND THROAT WITH OR WITHOUT PRESENCE OF ULCERS (sore throat, sores in mouth, or a toothache) °UNUSUAL RASH, SWELLING OR PAIN  °UNUSUAL VAGINAL DISCHARGE OR ITCHING  ° °Items with * indicate a potential emergency and should be followed up as soon as possible or go to the Emergency Department if any problems should occur. ° °Please show the CHEMOTHERAPY ALERT CARD or IMMUNOTHERAPY ALERT CARD at  check-in to the Emergency Department and triage nurse. ° °Should you have questions after your visit or need to cancel or reschedule your appointment, please contact [facility] [place]  [phone] and follow the prompts.  Office hours are 8:00 a.m. to 4:30 p.m. Monday - Friday. Please note that voicemails left after 4:00 p.m. may not be returned until the following business day.  We are closed weekends and major holidays. You have access to a nurse at all times for urgent questions. Please call the main number to the clinic [phone] and follow the prompts. ° °For any non-urgent questions, you may also contact your provider using MyChart. We now offer e-Visits for anyone 18 and older to request care online for non-urgent symptoms. For details visit [email or web address]. °  °Also download the MyChart app! Go to the app store, search "MyChart", open the app, select [facility], and log in with your MyChart username and password. ° °Due to Covid, a mask is required upon entering the hospital/clinic. If you do not have a mask, one will be given to you upon arrival. For doctor visits, patients may have 1 support person aged 18 or older with them. For treatment visits, patients cannot have anyone with them due to current Covid guidelines and our immunocompromised population.  ° °Pembrolizumab injection °What is this medication? °PEMBROLIZUMAB (pem broe liz ue mab) is a monoclonal antibody. It is used to treat certain types of cancer. °This medicine may be used for other purposes; ask your health care provider or pharmacist if you have questions. °COMMON BRAND NAME(S): Keytruda °What should I tell my care   team before I take this medication? °They need to know if you have any of these conditions: °autoimmune diseases like Crohn's disease, ulcerative colitis, or lupus °have had or planning to have an allogeneic stem cell transplant (uses someone else's stem cells) °history of organ  transplant °history of chest radiation °nervous system problems like myasthenia gravis or Guillain-Barre syndrome °an unusual or allergic reaction to pembrolizumab, other medicines, foods, dyes, or preservatives °pregnant or trying to get pregnant °breast-feeding °How should I use this medication? °This medicine is for infusion into a vein. It is given by a health care professional in a hospital or clinic setting. °A special MedGuide will be given to you before each treatment. Be sure to read this information carefully each time. °Talk to your pediatrician regarding the use of this medicine in children. While this drug may be prescribed for children as young as 6 months for selected conditions, precautions do apply. °Overdosage: If you think you have taken too much of this medicine contact a poison control center or emergency room at once. °NOTE: This medicine is only for you. Do not share this medicine with others. °What if I miss a dose? °It is important not to miss your dose. Call your doctor or health care professional if you are unable to keep an appointment. °What may interact with this medication? °Interactions have not been studied. °This list may not describe all possible interactions. Give your health care provider a list of all the medicines, herbs, non-prescription drugs, or dietary supplements you use. Also tell them if you smoke, drink alcohol, or use illegal drugs. Some items may interact with your medicine. °What should I watch for while using this medication? °Your condition will be monitored carefully while you are receiving this medicine. °You may need blood work done while you are taking this medicine. °Do not become pregnant while taking this medicine or for 4 months after stopping it. Women should inform their doctor if they wish to become pregnant or think they might be pregnant. There is a potential for serious side effects to an unborn child. Talk to your health care professional or  pharmacist for more information. Do not breast-feed an infant while taking this medicine or for 4 months after the last dose. °What side effects may I notice from receiving this medication? °Side effects that you should report to your doctor or health care professional as soon as possible: °allergic reactions like skin rash, itching or hives, swelling of the face, lips, or tongue °bloody or black, tarry °breathing problems °changes in vision °chest pain °chills °confusion °constipation °cough °diarrhea °dizziness or feeling faint or lightheaded °fast or irregular heartbeat °fever °flushing °joint pain °low blood counts - this medicine may decrease the number of white blood cells, red blood cells and platelets. You may be at increased risk for infections and bleeding. °muscle pain °muscle weakness °pain, tingling, numbness in the hands or feet °persistent headache °redness, blistering, peeling or loosening of the skin, including inside the mouth °signs and symptoms of high blood sugar such as dizziness; dry mouth; dry skin; fruity breath; nausea; stomach pain; increased hunger or thirst; increased urination °signs and symptoms of kidney injury like trouble passing urine or change in the amount of urine °signs and symptoms of liver injury like dark urine, light-colored stools, loss of appetite, nausea, right upper belly pain, yellowing of the eyes or skin °sweating °swollen lymph nodes °weight loss °Side effects that usually do not require medical attention (report to your doctor   or health care professional if they continue or are bothersome): °decreased appetite °hair loss °tiredness °This list may not describe all possible side effects. Call your doctor for medical advice about side effects. You may report side effects to FDA at 1-800-FDA-1088. °Where should I keep my medication? °This drug is given in a hospital or clinic and will not be stored at home. °NOTE: This sheet is a summary. It may not cover all possible  information. If you have questions about this medicine, talk to your doctor, pharmacist, or health care provider. °© 2022 [employer] (2020-12-23 00:00:00) ° °

## 2021-06-17 LAB — T4: T4, Total: 6.1 ug/dL (ref 4.5–12.0)

## 2021-07-01 ENCOUNTER — Encounter: Payer: Self-pay | Admitting: Oncology

## 2021-07-07 ENCOUNTER — Inpatient Hospital Stay (HOSPITAL_BASED_OUTPATIENT_CLINIC_OR_DEPARTMENT_OTHER): Payer: Medicare Other | Admitting: Medical Oncology

## 2021-07-07 ENCOUNTER — Inpatient Hospital Stay: Payer: Medicare Other | Attending: Oncology

## 2021-07-07 ENCOUNTER — Other Ambulatory Visit: Payer: Self-pay

## 2021-07-07 VITALS — BP 113/66 | HR 72 | Temp 97.4°F

## 2021-07-07 DIAGNOSIS — Z5112 Encounter for antineoplastic immunotherapy: Secondary | ICD-10-CM | POA: Diagnosis not present

## 2021-07-07 DIAGNOSIS — L2489 Irritant contact dermatitis due to other agents: Secondary | ICD-10-CM | POA: Diagnosis not present

## 2021-07-07 DIAGNOSIS — R21 Rash and other nonspecific skin eruption: Secondary | ICD-10-CM | POA: Insufficient documentation

## 2021-07-07 DIAGNOSIS — C321 Malignant neoplasm of supraglottis: Secondary | ICD-10-CM | POA: Diagnosis present

## 2021-07-07 DIAGNOSIS — Z930 Tracheostomy status: Secondary | ICD-10-CM | POA: Insufficient documentation

## 2021-07-07 DIAGNOSIS — Z87891 Personal history of nicotine dependence: Secondary | ICD-10-CM | POA: Insufficient documentation

## 2021-07-07 DIAGNOSIS — Z862 Personal history of diseases of the blood and blood-forming organs and certain disorders involving the immune mechanism: Secondary | ICD-10-CM | POA: Diagnosis not present

## 2021-07-07 DIAGNOSIS — Z7901 Long term (current) use of anticoagulants: Secondary | ICD-10-CM | POA: Diagnosis not present

## 2021-07-07 DIAGNOSIS — Z79899 Other long term (current) drug therapy: Secondary | ICD-10-CM | POA: Diagnosis not present

## 2021-07-07 DIAGNOSIS — I2699 Other pulmonary embolism without acute cor pulmonale: Secondary | ICD-10-CM | POA: Insufficient documentation

## 2021-07-07 DIAGNOSIS — N289 Disorder of kidney and ureter, unspecified: Secondary | ICD-10-CM | POA: Insufficient documentation

## 2021-07-07 MED ORDER — SODIUM CHLORIDE 0.9% FLUSH
10.0000 mL | INTRAVENOUS | Status: DC | PRN
  Administered 2021-07-07: 10 mL
  Filled 2021-07-07: qty 10

## 2021-07-07 MED ORDER — SODIUM CHLORIDE 0.9 % IV SOLN
200.0000 mg | Freq: Once | INTRAVENOUS | Status: AC
  Administered 2021-07-07: 200 mg via INTRAVENOUS
  Filled 2021-07-07: qty 200

## 2021-07-07 MED ORDER — SODIUM CHLORIDE 0.9 % IV SOLN
Freq: Once | INTRAVENOUS | Status: AC
  Filled 2021-07-07: qty 250

## 2021-07-07 MED ORDER — HEPARIN SOD (PORK) LOCK FLUSH 100 UNIT/ML IV SOLN
500.0000 [IU] | Freq: Once | INTRAVENOUS | Status: AC | PRN
  Administered 2021-07-07: 500 [IU]
  Filled 2021-07-07: qty 5

## 2021-07-07 MED ORDER — BETAMETHASONE VALERATE 0.1 % EX OINT: 1.0000 "application " | TOPICAL_OINTMENT | Freq: Two times a day (BID) | CUTANEOUS | 0 refills | Status: AC

## 2021-07-07 NOTE — Progress Notes (Signed)
? ?[facility] ?[place] [facility] ?[phone] Fax:([phone] ? ?Patient Care Team: ?Idelle Crouch, MD as PCP - General (Internal Medicine) ?Beverly Gust, MD (Otolaryngology) ?Lloyd Huger, MD as Consulting Physician (Oncology) ?Noreene Filbert, MD as Referring Physician (Radiation Oncology)  ? ?Name of the patient: Scott Harding  ?[id number]  ?[birth year]  ? ?Date of visit: 07/07/21 ? ?Reason for Consult: ?Scott Harding is a 66 y.o. male  ? ?Patient presented to our infusion clinic today for his normally scheduled infusion of Keytruda. He reports that about 2 weeks ago he developed an itchy rash on his bilateral hands, arms and chest.  This did start after he had been outside working but also started after he had changed laundry detergents.  He is unsure which was potentially the cause. He denies any other new products, medications, exposures. He states that he did switch back to his original laundry detergent and the rash has improved slightly.  He states at this time the rash is not progressing.  He denies any shortness of breath, wheezes, trouble breathing or swallowing or fevers.  He has tried Human resources officer for the rash with potential mild improvement.  ? ?  ?Denies any neurologic complaints. Denies recent fevers or illnesses. Denies any easy bleeding or bruising. Reports good appetite and denies weight loss. Denies chest pain. Denies any nausea, vomiting, constipation, or diarrhea. Denies urinary complaints. Patient offers no further specific complaints today. ? ? ? ?PAST MEDICAL HISTORY: ?Past Medical History:  ?Diagnosis Date  ? Cancer [facility])   ? Coronary artery disease   ? Hypertension   ? ? ?PAST SURGICAL HISTORY:  ?Past Surgical History:  ?Procedure Laterality Date  ? CORONARY ARTERY BYPASS GRAFT  2010  ? Quadruple  ? DIRECT LARYNGOSCOPY  11/28/2020  ? Procedure: DIRECT LARYNGOSCOPY WITH BIOPSY;  Surgeon: Beverly Gust, MD;  Location: [facility]  ORS;  Service: ENT;;  ? IR IMAGING GUIDED PORT INSERTION  01/01/2021  ? PULMONARY THROMBECTOMY Bilateral 01/13/2021  ? Procedure: PULMONARY THROMBECTOMY;  Surgeon: Katha Cabal, MD;  Location: [facility];  Service: Cardiovascular;  Laterality: Bilateral;  ? TRACHEOSTOMY TUBE PLACEMENT N/A 11/28/2020  ? Procedure: AWAKE TRACHEOSTOMY;  Surgeon: Beverly Gust, MD;  Location: [facility] ORS;  Service: ENT;  Laterality: N/A;  ? ? 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 Squamous cell carcinoma of left tonsil ([place]) ?  ?10/04/2018 - 11/22/2018 Chemotherapy  ?  ? ?  ? ?  ?Malignant neoplasm of supraglottis ([place])  ?12/11/2020 Initial Diagnosis  ? Malignant neoplasm of supraglottis ([place]) ?  ?12/11/2020 Cancer Staging  ? Staging form: Larynx - Supraglottis, AJCC 8th Edition ?- Clinical stage from 12/11/2020: Stage IVC (cT3, cN2b, cM1) - Signed by Lloyd Huger, MD on 01/26/2021 ? ?  ?12/30/2020 -  Chemotherapy  ? Patient is on Treatment Plan : LUNG NSCLC Carboplatin + Paclitaxel + Pembrolizumab q21d x 4 cycles / Pembrolizumab Maintenance Q21D  ?   ? ? ?ALLERGIES:  has No Known Allergies. ? ?MEDICATIONS:  ?Current Outpatient Medications  ?Medication Sig Dispense Refill  ? betamethasone valerate ointment (VALISONE) 0.1 % Apply 1 application. topically 2 (two) times daily. For up to 2 weeks 30 g 0  ? ALPRAZolam (XANAX) 0.5 MG tablet Take 1 tablet (0.5 mg total) by mouth daily as needed for anxiety (Take 1 hour prior to radiation). (Patient not taking: Reported on 04/14/2021) 30 tablet 1  ?  amLODipine (NORVASC) 10 MG tablet Take 10 mg by mouth daily.    ? ascorbic acid (VITAMIN C) 500 MG tablet Take by mouth.    ? cyanocobalamin 1000 MCG tablet Take by mouth.    ? DENTA 5000 PLUS 1.1 % CREA dental cream     ? feeding supplement (ENSURE ENLIVE / ENSURE PLUS) LIQD Take 237 mLs by mouth 3 (three) times daily between meals. (Patient not  taking: Reported on 03/24/2021) 21330 mL 0  ? fluticasone (FLONASE) 50 MCG/ACT nasal spray Place 1 spray into both nostrils daily. (Patient not taking: Reported on 03/24/2021) 15.8 mL 0  ? gentamicin ointment (GARAMYCIN) 0.1 % Apply topically. (Patient not taking: Reported on 04/14/2021)    ? hydrALAZINE (APRESOLINE) 50 MG tablet Take 50 mg by mouth in the morning and at bedtime. 1.5 tab BID    ? ipratropium-albuterol (DUONEB) 0.5-2.5 (3) MG/3ML SOLN Take 3 mLs by nebulization every 6 (six) hours. (Patient not taking: Reported on 03/24/2021) 360 mL 0  ? metoprolol succinate (TOPROL-XL) 25 MG 24 hr tablet Take 25 mg by mouth daily.    ? olmesartan (BENICAR) 40 MG tablet Take 40 mg by mouth daily.    ? ondansetron (ZOFRAN) 8 MG tablet Take 1 tablet (8 mg total) by mouth every 8 (eight) hours as needed for nausea or vomiting. (Patient not taking: Reported on 04/14/2021) 60 tablet 2  ? prochlorperazine (COMPAZINE) 10 MG tablet Take 1 tablet (10 mg total) by mouth every 6 (six) hours as needed for nausea or vomiting. (Patient not taking: Reported on 04/14/2021) 60 tablet 2  ? rivaroxaban (XARELTO) 20 MG TABS tablet Take 1 tablet (20 mg total) by mouth daily with supper. 30 tablet 2  ? rosuvastatin (CRESTOR) 40 MG tablet Take 40 mg by mouth daily.    ? ?No current facility-administered medications for this visit.  ? ?Facility-Administered Medications Ordered in Other Visits  ?Medication Dose Route Frequency Provider Last Rate Last Admin  ? heparin lock flush 100 UNIT/ML injection           ? heparin lock flush 100 unit/mL  500 Units Intravenous Once Lloyd Huger, MD      ? heparin lock flush 100 unit/mL  500 Units Intracatheter Once PRN Lloyd Huger, MD      ? pembrolizumab [facility]) 200 mg in sodium chloride 0.9 % 50 mL chemo infusion  200 mg Intravenous Once Lloyd Huger, MD 116 mL/hr at 07/07/21 1439 200 mg at 07/07/21 1439  ? sodium chloride flush (NS) 0.9 % injection 10 mL  10 mL Intravenous PRN  Lloyd Huger, MD   10 mL at 01/06/21 1305  ? sodium chloride flush (NS) 0.9 % injection 10 mL  10 mL Intracatheter PRN Lloyd Huger, MD   10 mL at 07/07/21 1415  ? ? ?VITAL SIGNS: ?There were no vitals taken for this visit. ?There were no vitals filed for this visit.  ?Estimated body mass index is 31.23 kg/m? as calculated from the following: ?  Height as of 01/13/21: '5\' 9"'$  (1.753 m). ?  Weight as of 06/16/21: 211 lb 8 oz (95.9 kg). ? ?LABS: ?CBC: ?   ?Component Value Date/Time  ? WBC 5.5 06/16/2021 1303  ? HGB 12.6 (L) 06/16/2021 1303  ? HCT 39.0 06/16/2021 1303  ? PLT 184 06/16/2021 1303  ? MCV 94.7 06/16/2021 1303  ? NEUTROABS 3.7 06/16/2021 1303  ? LYMPHSABS 0.9 06/16/2021 1303  ? MONOABS 0.6 06/16/2021 1303  ?  EOSABS 0.3 06/16/2021 1303  ? BASOSABS 0.0 06/16/2021 1303  ? ?Comprehensive Metabolic Panel: ?   ?Component Value Date/Time  ? NA 138 06/16/2021 1303  ? K 3.8 06/16/2021 1303  ? CL 104 06/16/2021 1303  ? CO2 27 06/16/2021 1303  ? BUN 19 06/16/2021 1303  ? CREATININE 1.28 (H) 06/16/2021 1303  ? GLUCOSE 107 (H) 06/16/2021 1303  ? CALCIUM 9.2 06/16/2021 1303  ? AST 21 06/16/2021 1303  ? ALT 17 06/16/2021 1303  ? ALKPHOS 44 06/16/2021 1303  ? BILITOT <0.1 (L) 06/16/2021 1303  ? PROT 7.4 06/16/2021 1303  ? ALBUMIN 4.1 06/16/2021 1303  ? ? ?RADIOGRAPHIC STUDIES: ?No results found. ? ?PERFORMANCE STATUS (ECOG) : 1 - Symptomatic but completely ambulatory ? ?Review of Systems  ?Skin:  Positive for rash.  ?Unless otherwise noted, a complete review of systems is negative. ? ?Physical Exam ?General: NAD ?Cardiovascular: regular rate and rhythm ?Pulmonary: clear ant fields ?Abdomen: soft, nontender, + bowel sounds ?GU: no suprapubic tenderness ?Extremities: no edema, no joint deformities ?Skin: There is a slightly raise and slightly glossy erythematic maculopapular rash of his bilateral dorsal hands, arms and upper chest. No weeping, discharge or bleeding.  ?Neurological: Weakness but otherwise  nonfocal ? ?Assessment and Plan- Patient is a 66 y.o. male with what appears to be a contact dermatitis either secondary to his new laundry detergent versus outdoor exposure such as poison ivy.  The rash is not

## 2021-07-07 NOTE — Progress Notes (Signed)
Pt with red raised rash over both arms and chest, esp noted on left han.d  States it itches sometimes.  DR Grayland Ormond ok to proceed. Raymond PA to see pt in chair. ?

## 2021-07-07 NOTE — Patient Instructions (Signed)
[facility]  Discharge Instructions: ?Thank you for choosing [facility] to provide your oncology and hematology care.  ?If you have a lab appointment with the Cancer Center, please go directly to the Cancer Center and check in at the registration area. ? ?Wear comfortable clothing and clothing appropriate for easy access to any Portacath or PICC line.  ? ?We strive to give you quality time with your provider. You may need to reschedule your appointment if you arrive late (15 or more minutes).  Arriving late affects you and other patients whose appointments are after yours.  Also, if you miss three or more appointments without notifying the office, you may be dismissed from the clinic at the provider?s discretion.    ?  ?For prescription refill requests, have your pharmacy contact our office and allow 72 hours for refills to be completed.   ? ?Today you received the following chemotherapy and/or immunotherapy agents KEYTRUDA ?    ?  ?To help prevent nausea and vomiting after your treatment, we encourage you to take your nausea medication as directed. ? ?BELOW ARE SYMPTOMS THAT SHOULD BE REPORTED IMMEDIATELY: ?*FEVER GREATER THAN 100.4 F (38 ?C) OR HIGHER ?*CHILLS OR SWEATING ?*NAUSEA AND VOMITING THAT IS NOT CONTROLLED WITH YOUR NAUSEA MEDICATION ?*UNUSUAL SHORTNESS OF BREATH ?*UNUSUAL BRUISING OR BLEEDING ?*URINARY PROBLEMS (pain or burning when urinating, or frequent urination) ?*BOWEL PROBLEMS (unusual diarrhea, constipation, pain near the anus) ?TENDERNESS IN MOUTH AND THROAT WITH OR WITHOUT PRESENCE OF ULCERS (sore throat, sores in mouth, or a toothache) ?UNUSUAL RASH, SWELLING OR PAIN  ?UNUSUAL VAGINAL DISCHARGE OR ITCHING  ? ?Items with * indicate a potential emergency and should be followed up as soon as possible or go to the Emergency Department if any problems should occur. ? ?Please show the CHEMOTHERAPY ALERT CARD or IMMUNOTHERAPY ALERT CARD at check-in to  the Emergency Department and triage nurse. ? ?Should you have questions after your visit or need to cancel or reschedule your appointment, please contact [facility]  [phone] and follow the prompts.  Office hours are 8:00 a.m. to 4:30 p.m. Monday - Friday. Please note that voicemails left after 4:00 p.m. may not be returned until the following business day.  We are closed weekends and major holidays. You have access to a nurse at all times for urgent questions. Please call the main number to the clinic [phone] and follow the prompts. ? ?For any non-urgent questions, you may also contact your provider using MyChart. We now offer e-Visits for anyone 18 and older to request care online for non-urgent symptoms. For details visit [email or web address]. ?  ?Also download the MyChart app! Go to the app store, search "MyChart", open the app, select [place], and log in with your MyChart username and password. ? ?Due to Covid, a mask is required upon entering the hospital/clinic. If you do not have a mask, one will be given to you upon arrival. For doctor visits, patients may have 1 support person aged 18 or older with them. For treatment visits, patients cannot have anyone with them due to current Covid guidelines and our immunocompromised population.  ? ?Pembrolizumab injection ?What is this medication? ?PEMBROLIZUMAB (pem broe liz ue mab) is a monoclonal antibody. It is used to treat certain types of cancer. ?This medicine may be used for other purposes; ask your health care provider or pharmacist if you have questions. ?COMMON BRAND NAME(S): Keytruda ?What should I tell my care   team before I take this medication? ?They need to know if you have any of these conditions: ?autoimmune diseases like Crohn's disease, ulcerative colitis, or lupus ?have had or planning to have an allogeneic stem cell transplant (uses someone else's stem cells) ?history of organ transplant ?history  of chest radiation ?nervous system problems like myasthenia gravis or Guillain-Barre syndrome ?an unusual or allergic reaction to pembrolizumab, other medicines, foods, dyes, or preservatives ?pregnant or trying to get pregnant ?breast-feeding ?How should I use this medication? ?This medicine is for infusion into a vein. It is given by a health care professional in a hospital or clinic setting. ?A special MedGuide will be given to you before each treatment. Be sure to read this information carefully each time. ?Talk to your pediatrician regarding the use of this medicine in children. While this drug may be prescribed for children as young as 6 months for selected conditions, precautions do apply. ?Overdosage: If you think you have taken too much of this medicine contact a poison control center or emergency room at once. ?NOTE: This medicine is only for you. Do not share this medicine with others. ?What if I miss a dose? ?It is important not to miss your dose. Call your doctor or health care professional if you are unable to keep an appointment. ?What may interact with this medication? ?Interactions have not been studied. ?This list may not describe all possible interactions. Give your health care provider a list of all the medicines, herbs, non-prescription drugs, or dietary supplements you use. Also tell them if you smoke, drink alcohol, or use illegal drugs. Some items may interact with your medicine. ?What should I watch for while using this medication? ?Your condition will be monitored carefully while you are receiving this medicine. ?You may need blood work done while you are taking this medicine. ?Do not become pregnant while taking this medicine or for 4 months after stopping it. Women should inform their doctor if they wish to become pregnant or think they might be pregnant. There is a potential for serious side effects to an unborn child. Talk to your health care professional or pharmacist for more  information. Do not breast-feed an infant while taking this medicine or for 4 months after the last dose. ?What side effects may I notice from receiving this medication? ?Side effects that you should report to your doctor or health care professional as soon as possible: ?allergic reactions like skin rash, itching or hives, swelling of the face, lips, or tongue ?bloody or black, tarry ?breathing problems ?changes in vision ?chest pain ?chills ?confusion ?constipation ?cough ?diarrhea ?dizziness or feeling faint or lightheaded ?fast or irregular heartbeat ?fever ?flushing ?joint pain ?low blood counts - this medicine may decrease the number of white blood cells, red blood cells and platelets. You may be at increased risk for infections and bleeding. ?muscle pain ?muscle weakness ?pain, tingling, numbness in the hands or feet ?persistent headache ?redness, blistering, peeling or loosening of the skin, including inside the mouth ?signs and symptoms of high blood sugar such as dizziness; dry mouth; dry skin; fruity breath; nausea; stomach pain; increased hunger or thirst; increased urination ?signs and symptoms of kidney injury like trouble passing urine or change in the amount of urine ?signs and symptoms of liver injury like dark urine, light-colored stools, loss of appetite, nausea, right upper belly pain, yellowing of the eyes or skin ?sweating ?swollen lymph nodes ?weight loss ?Side effects that usually do not require medical attention (report to your doctor   or health care professional if they continue or are bothersome): ?decreased appetite ?hair loss ?tiredness ?This list may not describe all possible side effects. Call your doctor for medical advice about side effects. You may report side effects to FDA at 1-800-FDA-1088. ?Where should I keep my medication? ?This drug is given in a hospital or clinic and will not be stored at home. ?NOTE: This sheet is a summary. It may not cover all possible information. If you  have questions about this medicine, talk to your doctor, pharmacist, or health care provider. ?? 2022 Elsevier/Gold Standard (2020-12-23 00:00:00) ? ?

## 2021-07-16 ENCOUNTER — Other Ambulatory Visit: Payer: Self-pay | Admitting: Oncology

## 2021-07-27 NOTE — Progress Notes (Signed)
?[place]  ?[phone] [phone] ? ?ID: [name] OB: 1956/02/12  MR#: [id number]  BZJ#:[id number] ? ?[facility] Care Team: ?Idelle Crouch, MD as PCP - General (Internal Medicine) ?Beverly Gust, MD (Otolaryngology) ?Lloyd Huger, MD as Consulting Physician (Oncology) ?Noreene Filbert, MD as Referring Physician (Radiation Oncology) ? 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 ? ?INTERVAL HISTORY: Harding returns to clinic today for further evaluation and continuation of his maintenance Keytruda.  He reports he noticed a lump on his neck about 3 to 4 weeks ago that has significantly grown in size since then.  He now has increased wheezing and difficulty swallowing.  He has a chronic peripheral neuropathy that is unchanged.  He has no other neurologic complaints.  He denies any other pain.  He denies any recent fevers or illnesses. He has no chest pain, shortness of breath, cough, or hemoptysis.  He denies any nausea, vomiting, constipation, or diarrhea.  He has no urinary complaints.  Harding offers no further specific complaints today. ? ?REVIEW OF SYSTEMS:   ?Review of Systems  ?Constitutional: Negative.  Negative for fever, malaise/fatigue and weight loss.  ?Respiratory: Negative.  Negative for cough, hemoptysis and shortness of breath.   ?Cardiovascular: Negative.  Negative for chest pain and leg swelling.  ?Gastrointestinal: Negative.  Negative for abdominal pain.  ?Genitourinary: Negative.  Negative for dysuria.  ?Musculoskeletal: Negative.  Negative for neck pain.  ?Skin: Negative.  Negative for rash.  ?Neurological:  Positive for tingling. Negative for dizziness, focal weakness, weakness and headaches.  ?Psychiatric/Behavioral: Negative.  [place] Harding is not nervous/anxious.   ? ?As per HPI. Otherwise, a complete review of systems is  negative. ? ?PAST MEDICAL HISTORY: ?Past Medical History:  ?Diagnosis Date  ? Cancer [facility])   ? Coronary artery disease   ? Hypertension   ? ? ?PAST SURGICAL HISTORY: ?Past Surgical History:  ?Procedure Laterality Date  ? CORONARY ARTERY BYPASS GRAFT  2010  ? Quadruple  ? DIRECT LARYNGOSCOPY  11/28/2020  ? Procedure: DIRECT LARYNGOSCOPY WITH BIOPSY;  Surgeon: Beverly Gust, MD;  Location: [facility] ORS;  Service: ENT;;  ? IR IMAGING GUIDED PORT INSERTION  01/01/2021  ? PULMONARY THROMBECTOMY Bilateral 01/13/2021  ? Procedure: PULMONARY THROMBECTOMY;  Surgeon: Katha Cabal, MD;  Location: [place] [facility];  Service: Cardiovascular;  Laterality: Bilateral;  ? TRACHEOSTOMY TUBE PLACEMENT N/A 11/28/2020  ? Procedure: AWAKE TRACHEOSTOMY;  Surgeon: Beverly Gust, MD;  Location: [facility] ORS;  Service: ENT;  Laterality: N/A;  ? ? ?FAMILY HISTORY: ?Family History  ?Problem Relation Age of Onset  ? Arthritis Mother   ? Heart attack Father   ? Brain cancer Sister   ? ? ?ADVANCED DIRECTIVES (Y/N):  N ? ?HEALTH MAINTENANCE: ?Social History  ? ?Tobacco Use  ? Smoking status: Former  ?  Types: Cigarettes  ?  Quit date: 10/17/2008  ?  Years since quitting: 12.7  ? Smokeless tobacco: Never  ?Vaping Use  ? Vaping Use: Never used  ?Substance Use Topics  ? Alcohol use: Yes  ?  Comment: occasionally  ? Drug use: Never  ? ? ? Colonoscopy: ? PAP: ? Bone density: ? Lipid panel: ? ?No Known Allergies ? ?Current Outpatient Medications  ?Medication Sig Dispense Refill  ? amLODipine (NORVASC) 10 MG tablet Take 10 mg by mouth daily.    ? ascorbic acid (VITAMIN C) 500 MG tablet  Take by mouth.    ? betamethasone valerate ointment (VALISONE) 0.1 % Apply 1 application. topically 2 (two) times daily. For up to 2 weeks 30 g 0  ? cyanocobalamin 1000 MCG tablet Take by mouth.    ? DENTA 5000 PLUS 1.1 % CREA dental cream     ? metoprolol succinate (TOPROL-XL) 25 MG 24 hr tablet Take 25 mg by mouth daily.    ? olmesartan (BENICAR) 40 MG tablet Take 40  mg by mouth daily.    ? predniSONE (DELTASONE) 10 MG tablet Take 6 tablets (60 mg total) by mouth daily with breakfast. Then start taper pack. 42 tablet 0  ? predniSONE (STERAPRED UNI-PAK 21 TAB) 10 MG (21) TBPK tablet Start with 6 tablets and every day decrease by 1 tablet until pills are gone. Take with food 21 tablet 0  ? rivaroxaban (XARELTO) 20 MG TABS tablet Take 1 tablet (20 mg total) by mouth daily with supper. 30 tablet 2  ? rosuvastatin (CRESTOR) 40 MG tablet Take 40 mg by mouth daily.    ? ALPRAZolam (XANAX) 0.5 MG tablet Take 1 tablet (0.5 mg total) by mouth daily as needed for anxiety (Take 1 hour prior to radiation). ([facility] not taking: Reported on 04/14/2021) 30 tablet 1  ? fluticasone (FLONASE) 50 MCG/ACT nasal spray Place 1 spray into both nostrils daily. ([facility] not taking: Reported on 03/24/2021) 15.8 mL 0  ? gentamicin ointment (GARAMYCIN) 0.1 % Apply topically.    ? hydrALAZINE (APRESOLINE) 50 MG tablet Take 50 mg by mouth in Scott morning and at bedtime. 1.5 tab BID ([facility]arding not taking: Reported on 07/28/2021)    ? ipratropium-albuterol (DUONEB) 0.5-2.5 (3) MG/3ML SOLN Take 3 mLs by nebulization every 6 (six) hours. ([facility]arding not taking: Reported on 03/24/2021) 360 mL 0  ? ondansetron (ZOFRAN) 8 MG tablet Take 1 tablet (8 mg total) by mouth every 8 (eight) hours as needed for nausea or vomiting. ([facility] not taking: Reported on 04/14/2021) 60 tablet 2  ? prochlorperazine (COMPAZINE) 10 MG tablet Take 1 tablet (10 mg total) by mouth every 6 (six) hours as needed for nausea or vomiting. ([facility] not taking: Reported on 04/14/2021) 60 tablet 2  ? ?No current facility-administered medications for this visit.  ? ?Facility-Administered Medications Ordered in Other Visits  ?Medication Dose Route Frequency Provider Last Rate Last Admin  ? heparin lock flush 100 UNIT/ML injection           ? heparin lock flush 100 unit/mL  500 Units Intravenous Once Lloyd Huger, MD      ? heparin lock flush 100  unit/mL  500 Units Intracatheter Once PRN Lloyd Huger, MD      ? sodium chloride flush (NS) 0.9 % injection 10 mL  10 mL Intravenous PRN Lloyd Huger, MD   10 mL at 01/06/21 1305  ? ? ?OBJECTIVE: ?Vitals:  ? 07/28/21 1402  ?BP: (!) 144/84  ?Pulse: 69  ?Resp: 16  ?Temp: 97.6 ?F (36.4 ?C)  ?SpO2: 99%  ? ?   Body mass index is 30.2 kg/m?Marland Kitchen    ECOG FS:0 - Asymptomatic ? ?General: Well-developed, well-nourished, no acute distress. ?Eyes: Pink conjunctiva, anicteric sclera. ?HEENT: Normocephalic, moist mucous membranes.  Palpable mass on Scott right side of neck. ?Lungs: Audible wheezing.   ?Heart: Regular rate and rhythm. ?Abdomen: Soft, nontender, no obvious distention. ?Musculoskeletal: No edema, cyanosis, or clubbing. ?Neuro: Alert, answering all questions appropriately. Cranial nerves grossly intact. ?Skin: No rashes or petechiae noted. ?Psych: Normal  affect. ? ? ?LAB RESULTS: ? ?Lab Results  ?Component Value Date  ? NA 136 07/28/2021  ? K 3.8 07/28/2021  ? CL 102 07/28/2021  ? CO2 27 07/28/2021  ? GLUCOSE 105 (H) 07/28/2021  ? BUN 16 07/28/2021  ? CREATININE 1.12 07/28/2021  ? CALCIUM 9.1 07/28/2021  ? PROT 7.7 07/28/2021  ? ALBUMIN 4.1 07/28/2021  ? AST 22 07/28/2021  ? ALT 18 07/28/2021  ? ALKPHOS 42 07/28/2021  ? BILITOT 0.3 07/28/2021  ? GFRNONAA >60 07/28/2021  ? GFRAA 37 (L) 11/13/2019  ? ? ?Lab Results  ?Component Value Date  ? WBC 6.4 07/28/2021  ? NEUTROABS 4.7 07/28/2021  ? HGB 13.2 07/28/2021  ? HCT 40.4 07/28/2021  ? MCV 91.0 07/28/2021  ? PLT 214 07/28/2021  ? ? ? ?STUDIES: ?CT SOFT TISSUE NECK W CONTRAST ? ?Result Date: 07/28/2021 ?CLINICAL DATA:  Head and neck cancer, follow-up EXAM: CT NECK WITH CONTRAST TECHNIQUE: Multidetector CT imaging of Scott neck was performed using Scott standard protocol following Scott bolus administration of intravenous contrast. RADIATION DOSE REDUCTION: This exam was performed according to Scott departmental dose-optimization program which includes automated exposure  control, adjustment of the mA and/or kV according to [facility] size and/or use of iterative reconstruction technique. CONTRAST:  89m OMNIPAQUE IOHEXOL 300 MG/ML  SOLN COMPARISON:  PET CT 03/23/2021 FINDINGS: P

## 2021-07-28 ENCOUNTER — Inpatient Hospital Stay (HOSPITAL_BASED_OUTPATIENT_CLINIC_OR_DEPARTMENT_OTHER): Payer: Medicare Other | Admitting: Oncology

## 2021-07-28 ENCOUNTER — Ambulatory Visit
Admission: RE | Admit: 2021-07-28 | Discharge: 2021-07-28 | Disposition: A | Discharge status: Home / Self Care | Payer: Medicare Other | Source: Ambulatory Visit | Attending: Oncology | Admitting: Oncology

## 2021-07-28 ENCOUNTER — Inpatient Hospital Stay: Payer: Medicare Other | Attending: Oncology

## 2021-07-28 ENCOUNTER — Encounter: Payer: Self-pay | Admitting: Oncology

## 2021-07-28 ENCOUNTER — Inpatient Hospital Stay: Payer: Medicare Other

## 2021-07-28 VITALS — BP 144/84 | HR 69 | Temp 97.6°F | Resp 16 | Wt 204.5 lb

## 2021-07-28 DIAGNOSIS — I2699 Other pulmonary embolism without acute cor pulmonale: Secondary | ICD-10-CM | POA: Insufficient documentation

## 2021-07-28 DIAGNOSIS — Z930 Tracheostomy status: Secondary | ICD-10-CM | POA: Diagnosis not present

## 2021-07-28 DIAGNOSIS — C321 Malignant neoplasm of supraglottis: Secondary | ICD-10-CM | POA: Insufficient documentation

## 2021-07-28 DIAGNOSIS — Z79899 Other long term (current) drug therapy: Secondary | ICD-10-CM | POA: Insufficient documentation

## 2021-07-28 DIAGNOSIS — G629 Polyneuropathy, unspecified: Secondary | ICD-10-CM | POA: Insufficient documentation

## 2021-07-28 DIAGNOSIS — Z7901 Long term (current) use of anticoagulants: Secondary | ICD-10-CM | POA: Diagnosis not present

## 2021-07-28 DIAGNOSIS — Z862 Personal history of diseases of the blood and blood-forming organs and certain disorders involving the immune mechanism: Secondary | ICD-10-CM | POA: Diagnosis not present

## 2021-07-28 DIAGNOSIS — Z87891 Personal history of nicotine dependence: Secondary | ICD-10-CM | POA: Insufficient documentation

## 2021-07-28 LAB — CBC WITH DIFFERENTIAL/PLATELET
Abs Immature Granulocytes: 0 10*3/uL (ref 0.00–0.07)
Basophils Absolute: 0 10*3/uL (ref 0.0–0.1)
Basophils Relative: 0 %
Eosinophils Absolute: 0.3 10*3/uL (ref 0.0–0.5)
Eosinophils Relative: 4 %
HCT: 40.4 % (ref 39.0–52.0)
Hemoglobin: 13.2 g/dL (ref 13.0–17.0)
Immature Granulocytes: 0 %
Lymphocytes Relative: 12 %
Lymphs Abs: 0.8 10*3/uL (ref 0.7–4.0)
MCH: 29.7 pg (ref 26.0–34.0)
MCHC: 32.7 g/dL (ref 30.0–36.0)
MCV: 91 fL (ref 80.0–100.0)
Monocytes Absolute: 0.6 10*3/uL (ref 0.1–1.0)
Monocytes Relative: 10 %
Neutro Abs: 4.7 10*3/uL (ref 1.7–7.7)
Neutrophils Relative %: 74 %
Platelets: 214 10*3/uL (ref 150–400)
RBC: 4.44 MIL/uL (ref 4.22–5.81)
RDW: 13.3 % (ref 11.5–15.5)
WBC: 6.4 10*3/uL (ref 4.0–10.5)
nRBC: 0 % (ref 0.0–0.2)

## 2021-07-28 LAB — COMPREHENSIVE METABOLIC PANEL
ALT: 18 U/L (ref 0–44)
AST: 22 U/L (ref 15–41)
Albumin: 4.1 g/dL (ref 3.5–5.0)
Alkaline Phosphatase: 42 U/L (ref 38–126)
Anion gap: 7 (ref 5–15)
BUN: 16 mg/dL (ref 8–23)
CO2: 27 mmol/L (ref 22–32)
Calcium: 9.1 mg/dL (ref 8.9–10.3)
Chloride: 102 mmol/L (ref 98–111)
Creatinine, Ser: 1.12 mg/dL (ref 0.61–1.24)
GFR, Estimated: >60 mL/min (ref >60)
Glucose, Bld: 105 mg/dL — ABNORMAL HIGH (ref 70–99)
Potassium: 3.8 mmol/L (ref 3.5–5.1)
Sodium: 136 mmol/L (ref 135–145)
Total Bilirubin: 0.3 mg/dL (ref 0.3–1.2)
Total Protein: 7.7 g/dL (ref 6.5–8.1)

## 2021-07-28 LAB — TSH: TSH: 2.637 u[IU]/mL (ref 0.350–4.500)

## 2021-07-28 MED ORDER — METHYLPREDNISOLONE SODIUM SUCC 125 MG IJ SOLR
125.0000 mg | Freq: Once | INTRAMUSCULAR | Status: AC
  Administered 2021-07-28: 125 mg via INTRAVENOUS
  Filled 2021-07-28: qty 2

## 2021-07-28 MED ORDER — PREDNISONE 10 MG (21) PO TBPK: ORAL_TABLET | ORAL | 0 refills | Status: DC

## 2021-07-28 MED ORDER — IOHEXOL 300 MG/ML  SOLN
75.0000 mL | Freq: Once | INTRAMUSCULAR | Status: AC | PRN
  Administered 2021-07-28: 75 mL via INTRAVENOUS

## 2021-07-28 MED ORDER — SODIUM CHLORIDE 0.9 % IV SOLN
Freq: Once | INTRAVENOUS | Status: AC
  Filled 2021-07-28: qty 250

## 2021-07-28 MED ORDER — SODIUM CHLORIDE 0.9 % IV SOLN: 200.0000 mg | Freq: Once | INTRAVENOUS | Status: DC

## 2021-07-28 MED ORDER — HEPARIN SOD (PORK) LOCK FLUSH 100 UNIT/ML IV SOLN
INTRAVENOUS | Status: AC
  Administered 2021-07-28: 500 [IU]
  Filled 2021-07-28: qty 5

## 2021-07-28 MED ORDER — HEPARIN SOD (PORK) LOCK FLUSH 100 UNIT/ML IV SOLN
500.0000 [IU] | Freq: Once | INTRAVENOUS | Status: DC | PRN
  Filled 2021-07-28: qty 5

## 2021-07-28 MED ORDER — PREDNISONE 10 MG PO TABS: 60.0000 mg | ORAL_TABLET | Freq: Every day | ORAL | 0 refills | Status: DC

## 2021-07-28 NOTE — Progress Notes (Signed)
Patient here for follow up he reports that he feels a lump on the right side of the neck tender with palpation. ?

## 2021-07-28 NOTE — Progress Notes (Signed)
Per Dr. Grayland Ormond, pt to receive 125 mg IV Solumedrol and hold Keytruda, pt to go straight to CT scan. Pt aware to report back to clinic for port de-access.  Pt stable.  ? ?

## 2021-07-29 ENCOUNTER — Telehealth: Payer: Self-pay | Admitting: *Deleted

## 2021-07-29 LAB — T4: T4, Total: 7.3 ug/dL (ref 4.5–12.0)

## 2021-07-29 NOTE — Telephone Encounter (Signed)
Called report ? ?IMPRESSION: ?Recurrent disease with bulky soft tissue at the level of the ?hypopharynx and supraglottic larynx. There is also asymmetric right ?paraglottic soft tissue. Displacement and significant partial ?effacement of the airway at these levels. There is extension through ?the right thyrohyoid membrane. Cartilage appears intact but ?asymmetric soft tissue to the right of the thyroid cartilage is ?suspicious. ?  ?Increase in size of nonenlarged right level 2 and supraclavicular ?lymph nodes also likely reflecting recurrent disease. ?  ?New mediastinal adenopathy. Chest CT is recommended to evaluate for ?recurrent pulmonary metastases. ?  ?These results will be called to the ordering clinician or ?representative by the Radiologist Assistant, and communication ?documented in the PACS or [employer]. ?  ?  ?Electronically Signed ?  By: Macy Mis M.D. ?  On: 07/28/2021 16:05 ?  ?

## 2021-07-30 DIAGNOSIS — C140 Malignant neoplasm of pharynx, unspecified: Secondary | ICD-10-CM | POA: Insufficient documentation

## 2021-07-31 NOTE — Telephone Encounter (Signed)
Scott Harding did you see results for patient?  ? ?Also patient was seen on 4/11 and wrap up states  ? ?Tx today ?Stat CT neck ?3wk lab/md/keytruda tse ? ?There were no future appointments made. Can one of you schedule patient please? ?

## 2021-08-11 ENCOUNTER — Other Ambulatory Visit (HOSPITAL_COMMUNITY): Payer: Self-pay

## 2021-08-11 ENCOUNTER — Other Ambulatory Visit: Payer: Self-pay | Admitting: Oncology

## 2021-08-11 DIAGNOSIS — C321 Malignant neoplasm of supraglottis: Secondary | ICD-10-CM

## 2021-08-12 ENCOUNTER — Telehealth: Payer: Self-pay | Admitting: *Deleted

## 2021-08-12 NOTE — Telephone Encounter (Signed)
Call from [facility] stating patient saw Dr Harriette Ohara who recommends 3 more cycles of Pembro/Carbo then he will be rescanned at [facility] after that. He requests we schedule patient for these treatments ?

## 2021-08-14 ENCOUNTER — Encounter: Payer: Self-pay | Admitting: Oncology

## 2021-08-14 ENCOUNTER — Other Ambulatory Visit: Payer: Self-pay | Admitting: Oncology

## 2021-08-14 NOTE — Progress Notes (Signed)
?[place]  ?Telephone:([phone]) [phone] [phone] ? ?ID: Scott Harding OB: [birth year]  MR#: [id number]  QKM#:[id number] ? ?Patient Care Team: ?Idelle Crouch, MD as PCP - General (Internal Medicine) ?Beverly Gust, MD (Otolaryngology) ?Lloyd Huger, MD as Consulting Physician (Oncology) ?Noreene Filbert, MD as Referring Physician (Radiation Oncology) ? 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 ? ?INTERVAL HISTORY: Patient returns to clinic today for further evaluation and reinitiation of carboplatinum, Taxol, and Keytruda.  He was recently evaluated at [facility] and now has his trach back in place.  He was also noted to have progressive metastatic disease.  Case discussed with [facility] head and neck physician.  He currently feels well.  He has a chronic peripheral neuropathy that is unchanged.  He has no other neurologic complaints.  He denies any pain today.  He denies any recent fevers or illnesses. He has no chest pain, shortness of breath, cough, or hemoptysis.  He denies any nausea, vomiting, constipation, or diarrhea.  He has no urinary complaints.  Patient offers no further specific complaints today. ? ?REVIEW OF SYSTEMS:   ?Review of Systems  ?Constitutional: Negative.  Negative for fever, malaise/fatigue and weight loss.  ?Respiratory: Negative.  Negative for cough, hemoptysis and shortness of breath.   ?Cardiovascular: Negative.  Negative for chest pain and leg swelling.  ?Gastrointestinal: Negative.  Negative for abdominal pain.  ?Genitourinary: Negative.  Negative for dysuria.  ?Musculoskeletal: Negative.  Negative for neck pain.  ?Skin: Negative.  Negative for rash.  ?Neurological:  Positive for tingling. Negative for dizziness, focal weakness, weakness and headaches.  ?Psychiatric/Behavioral: Negative.  The patient is not nervous/anxious.   ? ?As per HPI. Otherwise, a complete review of systems is negative. ? ?PAST  MEDICAL HISTORY: ?Past Medical History:  ?Diagnosis Date  ? Cancer [facility])   ? Coronary artery disease   ? Hypertension   ? ? ?PAST SURGICAL HISTORY: ?Past Surgical History:  ?Procedure Laterality Date  ? CORONARY ARTERY BYPASS GRAFT  2010  ? Quadruple  ? DIRECT LARYNGOSCOPY  11/28/2020  ? Procedure: DIRECT LARYNGOSCOPY WITH BIOPSY;  Surgeon: Beverly Gust, MD;  Location: [facility] ORS;  Service: ENT;;  ? IR IMAGING GUIDED PORT INSERTION  01/01/2021  ? PULMONARY THROMBECTOMY Bilateral 01/13/2021  ? Procedure: PULMONARY THROMBECTOMY;  Surgeon: Katha Cabal, MD;  Location: [place] [facility];  Service: Cardiovascular;  Laterality: Bilateral;  ? TRACHEOSTOMY TUBE PLACEMENT N/A 11/28/2020  ? Procedure: AWAKE TRACHEOSTOMY;  Surgeon: Beverly Gust, MD;  Location: [facility] ORS;  Service: ENT;  Laterality: N/A;  ? ? ?FAMILY HISTORY: ?Family History  ?Problem Relation Age of Onset  ? Arthritis Mother   ? Heart attack Father   ? Brain cancer Sister   ? ? ?ADVANCED DIRECTIVES (Y/N):  N ? ?HEALTH MAINTENANCE: ?Social History  ? ?Tobacco Use  ? Smoking status: Former  ?  Types: Cigarettes  ?  Quit date: 10/17/2008  ?  Years since quitting: 12.8  ? Smokeless tobacco: Never  ?Vaping Use  ? Vaping Use: Never used  ?Substance Use Topics  ? Alcohol use: Yes  ?  Comment: occasionally  ? Drug use: Never  ? ? ? Colonoscopy: ? PAP: ? Bone density: ? Lipid panel: ? ?No Known Allergies ? ?Current Outpatient Medications  ?Medication Sig Dispense Refill  ? amLODipine (NORVASC) 10 MG tablet Take 10 mg by mouth daily.    ? ascorbic acid (VITAMIN C) 500 MG tablet Take by mouth.    ?  betamethasone valerate ointment (VALISONE) 0.1 % Apply 1 application. topically 2 (two) times daily. For up to 2 weeks 30 g 0  ? cyanocobalamin 1000 MCG tablet Take by mouth.    ? DENTA 5000 PLUS 1.1 % CREA dental cream     ? gentamicin ointment (GARAMYCIN) 0.1 % Apply topically.    ? hydrALAZINE (APRESOLINE) 50 MG tablet Take 50 mg by mouth in the morning and at  bedtime. 1.5 tab BID    ? metoprolol succinate (TOPROL-XL) 25 MG 24 hr tablet Take 25 mg by mouth daily.    ? olmesartan (BENICAR) 40 MG tablet Take 40 mg by mouth daily.    ? predniSONE (DELTASONE) 10 MG tablet Take 6 tablets (60 mg total) by mouth daily with breakfast. Then start taper pack. 42 tablet 0  ? rosuvastatin (CRESTOR) 40 MG tablet Take 40 mg by mouth daily.    ? XARELTO 20 MG TABS tablet TAKE 1 TABLET BY MOUTH DAILY WITH SUPPER. 30 tablet 2  ? ALPRAZolam (XANAX) 0.5 MG tablet Take 1 tablet (0.5 mg total) by mouth daily as needed for anxiety (Take 1 hour prior to radiation). (Patient not taking: Reported on 04/14/2021) 30 tablet 1  ? fluticasone (FLONASE) 50 MCG/ACT nasal spray Place 1 spray into both nostrils daily. (Patient not taking: Reported on 03/24/2021) 15.8 mL 0  ? ipratropium-albuterol (DUONEB) 0.5-2.5 (3) MG/3ML SOLN Take 3 mLs by nebulization every 6 (six) hours. (Patient not taking: Reported on 03/24/2021) 360 mL 0  ? ondansetron (ZOFRAN) 8 MG tablet Take 1 tablet (8 mg total) by mouth every 8 (eight) hours as needed for nausea or vomiting. (Patient not taking: Reported on 04/14/2021) 60 tablet 2  ? prochlorperazine (COMPAZINE) 10 MG tablet Take 1 tablet (10 mg total) by mouth every 6 (six) hours as needed for nausea or vomiting. (Patient not taking: Reported on 04/14/2021) 60 tablet 2  ? ?No current facility-administered medications for this visit.  ? ?Facility-Administered Medications Ordered in Other Visits  ?Medication Dose Route Frequency Provider Last Rate Last Admin  ? heparin lock flush 100 UNIT/ML injection           ? heparin lock flush 100 unit/mL  500 Units Intravenous Once Lloyd Huger, MD      ? sodium chloride flush (NS) 0.9 % injection 10 mL  10 mL Intravenous PRN Lloyd Huger, MD   10 mL at 01/06/21 1305  ? ? ?OBJECTIVE: ?Vitals:  ? 08/20/21 0826  ?BP: 127/77  ?Pulse: 77  ?Resp: 16  ?Temp: 97.6 ?F (36.4 ?C)  ?SpO2: 99%  ? ?   Body mass index is 29.83 kg/m?Marland Kitchen     ECOG FS:0 - Asymptomatic ? ?General: Well-developed, well-nourished, no acute distress. ?Eyes: Pink conjunctiva, anicteric sclera. ?HEENT: Trach in place.  Easily palpable mass on right neck.   ?Lungs: No audible wheezing or coughing. ?Heart: Regular rate and rhythm. ?Abdomen: Soft, nontender, no obvious distention. ?Musculoskeletal: No edema, cyanosis, or clubbing. ?Neuro: Alert, answering all questions appropriately. Cranial nerves grossly intact. ?Skin: No rashes or petechiae noted. ?Psych: Normal affect. ? ? ? ?LAB RESULTS: ? ?Lab Results  ?Component Value Date  ? NA 134 (L) 08/20/2021  ? K 3.7 08/20/2021  ? CL 106 08/20/2021  ? CO2 22 08/20/2021  ? GLUCOSE 229 (H) 08/20/2021  ? BUN 30 (H) 08/20/2021  ? CREATININE 1.24 08/20/2021  ? CALCIUM 8.4 (L) 08/20/2021  ? PROT 6.5 08/20/2021  ? ALBUMIN 3.4 (L) 08/20/2021  ? AST 19  08/20/2021  ? ALT 27 08/20/2021  ? ALKPHOS 48 08/20/2021  ? BILITOT 0.8 08/20/2021  ? GFRNONAA >60 08/20/2021  ? GFRAA 37 (L) 11/13/2019  ? ? ?Lab Results  ?Component Value Date  ? WBC 6.4 08/20/2021  ? NEUTROABS 5.9 08/20/2021  ? HGB 11.1 (L) 08/20/2021  ? HCT 33.7 (L) 08/20/2021  ? MCV 91.8 08/20/2021  ? PLT 229 08/20/2021  ? ? ? ?STUDIES: ?CT SOFT TISSUE NECK W CONTRAST ? ?Result Date: 07/28/2021 ?CLINICAL DATA:  Head and neck cancer, follow-up EXAM: CT NECK WITH CONTRAST TECHNIQUE: Multidetector CT imaging of the neck was performed using the standard protocol following the bolus administration of intravenous contrast. RADIATION DOSE REDUCTION: This exam was performed according to the departmental dose-optimization program which includes automated exposure control, adjustment of the mA and/or kV according to patient size and/or use of iterative reconstruction technique. CONTRAST:  53m OMNIPAQUE IOHEXOL 300 MG/ML  SOLN COMPARISON:  PET CT 03/23/2021 FINDINGS: Pharynx and larynx: Return of bulky posterior soft tissue thickening asymmetric to the right at the level of the hypopharynx and  supraglottic larynx. Asymmetric right paraglottic soft tissue thickening. Asymmetric soft tissue lateral to the thyroid cartilage and the thyrohyoid membrane on the right. Stable thickening of the epiglottis. Displace

## 2021-08-18 ENCOUNTER — Ambulatory Visit: Payer: Medicare Other | Admitting: Oncology

## 2021-08-18 ENCOUNTER — Ambulatory Visit: Payer: Medicare Other

## 2021-08-18 ENCOUNTER — Other Ambulatory Visit: Payer: Medicare Other

## 2021-08-20 ENCOUNTER — Inpatient Hospital Stay: Payer: Medicare Other | Attending: Oncology

## 2021-08-20 ENCOUNTER — Inpatient Hospital Stay (HOSPITAL_BASED_OUTPATIENT_CLINIC_OR_DEPARTMENT_OTHER): Payer: Medicare Other | Admitting: Oncology

## 2021-08-20 ENCOUNTER — Inpatient Hospital Stay: Payer: Medicare Other

## 2021-08-20 ENCOUNTER — Encounter: Payer: Self-pay | Admitting: Oncology

## 2021-08-20 VITALS — BP 99/56 | HR 49 | Temp 96.3°F | Resp 17

## 2021-08-20 VITALS — BP 127/77 | HR 77 | Temp 97.6°F | Resp 16 | Ht 69.0 in | Wt 202.0 lb

## 2021-08-20 DIAGNOSIS — I2699 Other pulmonary embolism without acute cor pulmonale: Secondary | ICD-10-CM | POA: Diagnosis not present

## 2021-08-20 DIAGNOSIS — C321 Malignant neoplasm of supraglottis: Secondary | ICD-10-CM

## 2021-08-20 DIAGNOSIS — Z7901 Long term (current) use of anticoagulants: Secondary | ICD-10-CM | POA: Diagnosis not present

## 2021-08-20 DIAGNOSIS — Z930 Tracheostomy status: Secondary | ICD-10-CM | POA: Insufficient documentation

## 2021-08-20 DIAGNOSIS — Z629 Problem related to upbringing, unspecified: Secondary | ICD-10-CM | POA: Insufficient documentation

## 2021-08-20 DIAGNOSIS — D649 Anemia, unspecified: Secondary | ICD-10-CM | POA: Insufficient documentation

## 2021-08-20 DIAGNOSIS — Z5112 Encounter for antineoplastic immunotherapy: Secondary | ICD-10-CM | POA: Diagnosis present

## 2021-08-20 DIAGNOSIS — Z5111 Encounter for antineoplastic chemotherapy: Secondary | ICD-10-CM | POA: Insufficient documentation

## 2021-08-20 DIAGNOSIS — Z79899 Other long term (current) drug therapy: Secondary | ICD-10-CM | POA: Insufficient documentation

## 2021-08-20 DIAGNOSIS — Z87891 Personal history of nicotine dependence: Secondary | ICD-10-CM | POA: Insufficient documentation

## 2021-08-20 DIAGNOSIS — G629 Polyneuropathy, unspecified: Secondary | ICD-10-CM | POA: Diagnosis not present

## 2021-08-20 LAB — COMPREHENSIVE METABOLIC PANEL
ALT: 27 U/L (ref 0–44)
AST: 19 U/L (ref 15–41)
Albumin: 3.4 g/dL — ABNORMAL LOW (ref 3.5–5.0)
Alkaline Phosphatase: 48 U/L (ref 38–126)
Anion gap: 6 (ref 5–15)
BUN: 30 mg/dL — ABNORMAL HIGH (ref 8–23)
CO2: 22 mmol/L (ref 22–32)
Calcium: 8.4 mg/dL — ABNORMAL LOW (ref 8.9–10.3)
Chloride: 106 mmol/L (ref 98–111)
Creatinine, Ser: 1.24 mg/dL (ref 0.61–1.24)
GFR, Estimated: >60 mL/min (ref >60)
Glucose, Bld: 229 mg/dL — ABNORMAL HIGH (ref 70–99)
Potassium: 3.7 mmol/L (ref 3.5–5.1)
Sodium: 134 mmol/L — ABNORMAL LOW (ref 135–145)
Total Bilirubin: 0.8 mg/dL (ref 0.3–1.2)
Total Protein: 6.5 g/dL (ref 6.5–8.1)

## 2021-08-20 LAB — CBC WITH DIFFERENTIAL/PLATELET
Abs Immature Granulocytes: 0.02 10*3/uL (ref 0.00–0.07)
Basophils Absolute: 0 10*3/uL (ref 0.0–0.1)
Basophils Relative: 0 %
Eosinophils Absolute: 0 10*3/uL (ref 0.0–0.5)
Eosinophils Relative: 0 %
HCT: 33.7 % — ABNORMAL LOW (ref 39.0–52.0)
Hemoglobin: 11.1 g/dL — ABNORMAL LOW (ref 13.0–17.0)
Immature Granulocytes: 0 %
Lymphocytes Relative: 4 %
Lymphs Abs: 0.3 10*3/uL — ABNORMAL LOW (ref 0.7–4.0)
MCH: 30.2 pg (ref 26.0–34.0)
MCHC: 32.9 g/dL (ref 30.0–36.0)
MCV: 91.8 fL (ref 80.0–100.0)
Monocytes Absolute: 0.2 10*3/uL (ref 0.1–1.0)
Monocytes Relative: 3 %
Neutro Abs: 5.9 10*3/uL (ref 1.7–7.7)
Neutrophils Relative %: 93 %
Platelets: 229 10*3/uL (ref 150–400)
RBC: 3.67 MIL/uL — ABNORMAL LOW (ref 4.22–5.81)
RDW: 15.4 % (ref 11.5–15.5)
WBC: 6.4 10*3/uL (ref 4.0–10.5)
nRBC: 0 % (ref 0.0–0.2)

## 2021-08-20 LAB — TSH: TSH: 0.536 u[IU]/mL (ref 0.350–4.500)

## 2021-08-20 MED ORDER — SODIUM CHLORIDE 0.9 % IV SOLN: 80.0000 mg/m2 | Freq: Once | INTRAVENOUS | Status: DC

## 2021-08-20 MED ORDER — SODIUM CHLORIDE 0.9 % IV SOLN
206.4000 mg | Freq: Once | INTRAVENOUS | Status: AC
  Administered 2021-08-20: 210 mg via INTRAVENOUS
  Filled 2021-08-20: qty 21

## 2021-08-20 MED ORDER — HEPARIN SOD (PORK) LOCK FLUSH 100 UNIT/ML IV SOLN
INTRAVENOUS | Status: AC
  Administered 2021-08-20: 500 [IU]
  Filled 2021-08-20: qty 5

## 2021-08-20 MED ORDER — SODIUM CHLORIDE 0.9 % IV SOLN
200.0000 mg | Freq: Once | INTRAVENOUS | Status: AC
  Administered 2021-08-20: 200 mg via INTRAVENOUS
  Filled 2021-08-20: qty 200

## 2021-08-20 MED ORDER — HEPARIN SOD (PORK) LOCK FLUSH 100 UNIT/ML IV SOLN
500.0000 [IU] | Freq: Once | INTRAVENOUS | Status: AC | PRN
  Filled 2021-08-20: qty 5

## 2021-08-20 MED ORDER — DIPHENHYDRAMINE HCL 50 MG/ML IJ SOLN
25.0000 mg | Freq: Once | INTRAMUSCULAR | Status: AC
  Administered 2021-08-20: 25 mg via INTRAVENOUS
  Filled 2021-08-20: qty 1

## 2021-08-20 MED ORDER — SODIUM CHLORIDE 0.9 % IV SOLN
150.0000 mg | Freq: Once | INTRAVENOUS | Status: AC
  Administered 2021-08-20: 150 mg via INTRAVENOUS
  Filled 2021-08-20: qty 150

## 2021-08-20 MED ORDER — SODIUM CHLORIDE 0.9 % IV SOLN
Freq: Once | INTRAVENOUS | Status: AC
  Filled 2021-08-20: qty 250

## 2021-08-20 MED ORDER — SODIUM CHLORIDE 0.9 % IV SOLN
80.0000 mg/m2 | Freq: Once | INTRAVENOUS | Status: AC
  Administered 2021-08-20: 168 mg via INTRAVENOUS
  Filled 2021-08-20: qty 28

## 2021-08-20 MED ORDER — PALONOSETRON HCL INJECTION 0.25 MG/5ML
0.2500 mg | Freq: Once | INTRAVENOUS | Status: AC
  Administered 2021-08-20: 0.25 mg via INTRAVENOUS
  Filled 2021-08-20: qty 5

## 2021-08-20 MED ORDER — SODIUM CHLORIDE 0.9 % IV SOLN
10.0000 mg | Freq: Once | INTRAVENOUS | Status: AC
  Administered 2021-08-20: 10 mg via INTRAVENOUS
  Filled 2021-08-20: qty 10

## 2021-08-20 MED ORDER — FAMOTIDINE IN NACL 20-0.9 MG/50ML-% IV SOLN
20.0000 mg | Freq: Once | INTRAVENOUS | Status: AC
  Administered 2021-08-20: 20 mg via INTRAVENOUS
  Filled 2021-08-20: qty 50

## 2021-08-20 NOTE — Patient Instructions (Signed)
[facility] CANCER CTR AT [place]  Discharge Instructions: ?Thank you for choosing [facility] to provide your oncology and hematology care.  ?If you have a lab appointment with the [facility], please go directly to the [facility] and check in at the registration area. ? ?Wear comfortable clothing and clothing appropriate for easy access to any Portacath or PICC line.  ? ?We strive to give you quality time with your provider. You may need to reschedule your appointment if you arrive late (15 or more minutes).  Arriving late affects you and other patients whose appointments are after yours.  Also, if you miss three or more appointments without notifying the office, you may be dismissed from the clinic at the provider?s discretion.    ?  ?For prescription refill requests, have your pharmacy contact our office and allow 72 hours for refills to be completed.   ? ?Today you received the following chemotherapy and/or immunotherapy agents Keytruda, taxol and Carboplatin    ?  ?To help prevent nausea and vomiting after your treatment, we encourage you to take your nausea medication as directed. ? ?BELOW ARE SYMPTOMS THAT SHOULD BE REPORTED IMMEDIATELY: ?*FEVER GREATER THAN 100.4 F (38 ?C) OR HIGHER ?*CHILLS OR SWEATING ?*NAUSEA AND VOMITING THAT IS NOT CONTROLLED WITH YOUR NAUSEA MEDICATION ?*UNUSUAL SHORTNESS OF BREATH ?*UNUSUAL BRUISING OR BLEEDING ?*URINARY PROBLEMS (pain or burning when urinating, or frequent urination) ?*BOWEL PROBLEMS (unusual diarrhea, constipation, pain near the anus) ?TENDERNESS IN MOUTH AND THROAT WITH OR WITHOUT PRESENCE OF ULCERS (sore throat, sores in mouth, or a toothache) ?UNUSUAL RASH, SWELLING OR PAIN  ?UNUSUAL VAGINAL DISCHARGE OR ITCHING  ? ?Items with * indicate a potential emergency and should be followed up as soon as possible or go to the Emergency Department if any problems should occur. ? ?Please show the CHEMOTHERAPY ALERT CARD or IMMUNOTHERAPY ALERT  CARD at check-in to the Emergency Department and triage nurse. ? ?Should you have questions after your visit or need to cancel or reschedule your appointment, please contact [facility] CANCER [place] AT [place]  [phone] and follow the prompts.  Office hours are 8:00 a.m. to 4:30 p.m. Monday - Friday. Please note that voicemails left after 4:00 p.m. may not be returned until the following business day.  We are closed weekends and major holidays. You have access to a nurse at all times for urgent questions. Please call the main number to the clinic [phone] and follow the prompts. ? ?For any non-urgent questions, you may also contact your provider using MyChart. We now offer e-Visits for anyone 12 and older to request care online for non-urgent symptoms. For details visit [email or web address]. ?  ?Also download the MyChart app! Go to the app store, search "MyChart", open the app, select [facility], and log in with your MyChart username and password. ? ?Due to Covid, a mask is required upon entering the hospital/clinic. If you do not have a mask, one will be given to you upon arrival. For doctor visits, patients may have 1 support person aged 80 or older with them. For treatment visits, patients cannot have anyone with them due to current Covid guidelines and our immunocompromised population.  ?

## 2021-08-21 ENCOUNTER — Encounter: Payer: Self-pay | Admitting: Oncology

## 2021-08-21 LAB — T4: T4, Total: 5.6 ug/dL (ref 4.5–12.0)

## 2021-08-22 NOTE — Progress Notes (Signed)
?[place]  ?[phone] [phone] ? ?ID: Scott Harding OB: February 13, 1956  MR#: [id number]  TSV#:[id number] ? ?Patient Care Team: ?Idelle Crouch, MD as PCP - General (Internal Medicine) ?Beverly Gust, MD (Otolaryngology) ?Lloyd Huger, MD as Consulting Physician (Oncology) ?Noreene Filbert, MD as Referring Physician (Radiation Oncology) ? 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 ? ?INTERVAL HISTORY: Patient returns to clinic today for further evaluation and consideration of cycle 1, day 8 of treatment which is carboplatinum and Taxol only. He currently feels well.  He has a chronic peripheral neuropathy that is unchanged.  He has no other neurologic complaints.  He denies any pain today.  He denies any recent fevers or illnesses. He has no chest pain, shortness of breath, cough, or hemoptysis.  He denies any nausea, vomiting, constipation, or diarrhea.  He has no urinary complaints.  Patient offers no further specific complaints today. ? ?REVIEW OF SYSTEMS:   ?Review of Systems  ?Constitutional: Negative.  Negative for fever, malaise/fatigue and weight loss.  ?Respiratory: Negative.  Negative for cough, hemoptysis and shortness of breath.   ?Cardiovascular: Negative.  Negative for chest pain and leg swelling.  ?Gastrointestinal: Negative.  Negative for abdominal pain.  ?Genitourinary: Negative.  Negative for dysuria.  ?Musculoskeletal: Negative.  Negative for neck pain.  ?Skin: Negative.  Negative for rash.  ?Neurological:  Positive for tingling. Negative for dizziness, focal weakness, weakness and headaches.  ?Psychiatric/Behavioral: Negative.  The patient is not nervous/anxious.   ? ?As per HPI. Otherwise, a complete review of systems is negative. ? ?PAST MEDICAL HISTORY: ?Past Medical History:  ?Diagnosis Date  ? Cancer [facility])   ? Coronary artery disease   ? Hypertension   ? ? ?PAST SURGICAL  HISTORY: ?Past Surgical History:  ?Procedure Laterality Date  ? CORONARY ARTERY BYPASS GRAFT  2010  ? Quadruple  ? DIRECT LARYNGOSCOPY  11/28/2020  ? Procedure: DIRECT LARYNGOSCOPY WITH BIOPSY;  Surgeon: Beverly Gust, MD;  Location: [facility] ORS;  Service: ENT;;  ? IR IMAGING GUIDED PORT INSERTION  01/01/2021  ? PULMONARY THROMBECTOMY Bilateral 01/13/2021  ? Procedure: PULMONARY THROMBECTOMY;  Surgeon: Katha Cabal, MD;  Location: [place] [facility];  Service: Cardiovascular;  Laterality: Bilateral;  ? TRACHEOSTOMY TUBE PLACEMENT N/A 11/28/2020  ? Procedure: AWAKE TRACHEOSTOMY;  Surgeon: Beverly Gust, MD;  Location: [facility] ORS;  Service: ENT;  Laterality: N/A;  ? ? ?FAMILY HISTORY: ?Family History  ?Problem Relation Age of Onset  ? Arthritis Mother   ? Heart attack Father   ? Brain cancer Sister   ? ? ?ADVANCED DIRECTIVES (Y/N):  N ? ?HEALTH MAINTENANCE: ?Social History  ? ?Tobacco Use  ? Smoking status: Former  ?  Types: Cigarettes  ?  Quit date: 10/17/2008  ?  Years since quitting: 12.8  ? Smokeless tobacco: Never  ?Vaping Use  ? Vaping Use: Never used  ?Substance Use Topics  ? Alcohol use: Yes  ?  Comment: occasionally  ? Drug use: Never  ? ? ? Colonoscopy: ? PAP: ? Bone density: ? Lipid panel: ? ?No Known Allergies ? ?Current Outpatient Medications  ?Medication Sig Dispense Refill  ? amLODipine (NORVASC) 10 MG tablet Take 10 mg by mouth daily.    ? ascorbic acid (VITAMIN C) 500 MG tablet Take by mouth.    ? betamethasone valerate ointment (VALISONE) 0.1 % Apply 1 application. topically 2 (two) times daily. For up to 2 weeks 30 g 0  ? cyanocobalamin  1000 MCG tablet Take by mouth.    ? DENTA 5000 PLUS 1.1 % CREA dental cream     ? gentamicin ointment (GARAMYCIN) 0.1 % Apply topically.    ? hydrALAZINE (APRESOLINE) 50 MG tablet Take 50 mg by mouth in the morning and at bedtime. 1.5 tab BID    ? metoprolol succinate (TOPROL-XL) 25 MG 24 hr tablet Take 25 mg by mouth daily.    ? olmesartan (BENICAR) 40 MG tablet  Take 40 mg by mouth daily.    ? predniSONE (DELTASONE) 10 MG tablet Take 6 tablets (60 mg total) by mouth daily with breakfast. Then start taper pack. 42 tablet 0  ? rosuvastatin (CRESTOR) 40 MG tablet Take 40 mg by mouth daily.    ? XARELTO 20 MG TABS tablet TAKE 1 TABLET BY MOUTH DAILY WITH SUPPER. 30 tablet 2  ? ALPRAZolam (XANAX) 0.5 MG tablet Take 1 tablet (0.5 mg total) by mouth daily as needed for anxiety (Take 1 hour prior to radiation). (Patient not taking: Reported on 04/14/2021) 30 tablet 1  ? fluticasone (FLONASE) 50 MCG/ACT nasal spray Place 1 spray into both nostrils daily. (Patient not taking: Reported on 03/24/2021) 15.8 mL 0  ? ipratropium-albuterol (DUONEB) 0.5-2.5 (3) MG/3ML SOLN Take 3 mLs by nebulization every 6 (six) hours. (Patient not taking: Reported on 03/24/2021) 360 mL 0  ? ondansetron (ZOFRAN) 8 MG tablet Take 1 tablet (8 mg total) by mouth every 8 (eight) hours as needed for nausea or vomiting. (Patient not taking: Reported on 04/14/2021) 60 tablet 2  ? prochlorperazine (COMPAZINE) 10 MG tablet Take 1 tablet (10 mg total) by mouth every 6 (six) hours as needed for nausea or vomiting. (Patient not taking: Reported on 04/14/2021) 60 tablet 2  ? ?No current facility-administered medications for this visit.  ? ?Facility-Administered Medications Ordered in Other Visits  ?Medication Dose Route Frequency Provider Last Rate Last Admin  ? CARBOplatin (PARAPLATIN) 210 mg in sodium chloride 0.9 % 250 mL chemo infusion  210 mg Intravenous Once Lloyd Huger, MD      ? dexamethasone (DECADRON) 10 mg in sodium chloride 0.9 % 50 mL IVPB  10 mg Intravenous Once Lloyd Huger, MD 204 mL/hr at 08/28/21 1005 10 mg at 08/28/21 1005  ? diphenhydrAMINE (BENADRYL) injection 25 mg  25 mg Intravenous Once Lloyd Huger, MD      ? famotidine (PEPCID) IVPB 20 mg premix  20 mg Intravenous Once Lloyd Huger, MD      ? fosaprepitant (EMEND) 150 mg in sodium chloride 0.9 % 145 mL IVPB  150 mg  Intravenous Once Lloyd Huger, MD      ? heparin lock flush 100 UNIT/ML injection           ? heparin lock flush 100 UNIT/ML injection           ? heparin lock flush 100 unit/mL  500 Units Intravenous Once Lloyd Huger, MD      ? PACLitaxel (TAXOL) 168 mg in sodium chloride 0.9 % 250 mL chemo infusion (</= '80mg'$ /m2)  80 mg/m2 (Treatment Plan Recorded) Intravenous Once Lloyd Huger, MD      ? sodium chloride flush (NS) 0.9 % injection 10 mL  10 mL Intravenous PRN Lloyd Huger, MD   10 mL at 01/06/21 1305  ? ? ?OBJECTIVE: ?Vitals:  ? 08/28/21 0900  ?BP: 131/67  ?Pulse: (!) 59  ?Resp: 18  ?Temp: (!) 96.7 ?F (35.9 ?C)  ?SpO2: 99%  ? ?  Body mass index is 29.68 kg/m?Marland Kitchen    ECOG FS:0 - Asymptomatic ? ?General: Well-developed, well-nourished, no acute distress. ?Eyes: Pink conjunctiva, anicteric sclera. ?HEENT: Normocephalic, moist mucous membranes.  Trach in place.  Easily palpable right neck mass.  ?Lungs: No audible wheezing or coughing. ?Heart: Regular rate and rhythm. ?Abdomen: Soft, nontender, no obvious distention. ?Musculoskeletal: No edema, cyanosis, or clubbing. ?Neuro: Alert, answering all questions appropriately. Cranial nerves grossly intact. ?Skin: No rashes or petechiae noted. ?Psych: Normal affect. ? ?LAB RESULTS: ? ?Lab Results  ?Component Value Date  ? NA 136 08/28/2021  ? K 4.1 08/28/2021  ? CL 103 08/28/2021  ? CO2 23 08/28/2021  ? GLUCOSE 90 08/28/2021  ? BUN 42 (H) 08/28/2021  ? CREATININE 1.30 (H) 08/28/2021  ? CALCIUM 8.7 (L) 08/28/2021  ? PROT 6.3 (L) 08/28/2021  ? ALBUMIN 3.4 (L) 08/28/2021  ? AST 16 08/28/2021  ? ALT 23 08/28/2021  ? ALKPHOS 38 08/28/2021  ? BILITOT 0.8 08/28/2021  ? GFRNONAA >60 08/28/2021  ? GFRAA 37 (L) 11/13/2019  ? ? ?Lab Results  ?Component Value Date  ? WBC 6.1 08/28/2021  ? NEUTROABS 4.3 08/28/2021  ? HGB 10.9 (L) 08/28/2021  ? HCT 33.9 (L) 08/28/2021  ? MCV 92.1 08/28/2021  ? PLT 286 08/28/2021  ? ? ? ?STUDIES: ?No results found. ? 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 ? ?PLAN:   ? ?1.  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

## 2021-08-25 ENCOUNTER — Other Ambulatory Visit: Payer: Self-pay

## 2021-08-28 ENCOUNTER — Inpatient Hospital Stay (HOSPITAL_BASED_OUTPATIENT_CLINIC_OR_DEPARTMENT_OTHER): Payer: Medicare Other | Admitting: Oncology

## 2021-08-28 ENCOUNTER — Inpatient Hospital Stay: Payer: Medicare Other

## 2021-08-28 VITALS — BP 131/67 | HR 59 | Temp 96.7°F | Resp 18 | Ht 69.0 in | Wt 201.0 lb

## 2021-08-28 VITALS — BP 101/63 | HR 72 | Temp 97.0°F

## 2021-08-28 DIAGNOSIS — Z5112 Encounter for antineoplastic immunotherapy: Secondary | ICD-10-CM | POA: Diagnosis not present

## 2021-08-28 DIAGNOSIS — C321 Malignant neoplasm of supraglottis: Secondary | ICD-10-CM

## 2021-08-28 LAB — COMPREHENSIVE METABOLIC PANEL
ALT: 23 U/L (ref 0–44)
AST: 16 U/L (ref 15–41)
Albumin: 3.4 g/dL — ABNORMAL LOW (ref 3.5–5.0)
Alkaline Phosphatase: 38 U/L (ref 38–126)
Anion gap: 10 (ref 5–15)
BUN: 42 mg/dL — ABNORMAL HIGH (ref 8–23)
CO2: 23 mmol/L (ref 22–32)
Calcium: 8.7 mg/dL — ABNORMAL LOW (ref 8.9–10.3)
Chloride: 103 mmol/L (ref 98–111)
Creatinine, Ser: 1.3 mg/dL — ABNORMAL HIGH (ref 0.61–1.24)
GFR, Estimated: >60 mL/min (ref >60)
Glucose, Bld: 90 mg/dL (ref 70–99)
Potassium: 4.1 mmol/L (ref 3.5–5.1)
Sodium: 136 mmol/L (ref 135–145)
Total Bilirubin: 0.8 mg/dL (ref 0.3–1.2)
Total Protein: 6.3 g/dL — ABNORMAL LOW (ref 6.5–8.1)

## 2021-08-28 LAB — CBC WITH DIFFERENTIAL/PLATELET
Abs Immature Granulocytes: 0.2 10*3/uL — ABNORMAL HIGH (ref 0.00–0.07)
Basophils Absolute: 0 10*3/uL (ref 0.0–0.1)
Basophils Relative: 0 %
Eosinophils Absolute: 0 10*3/uL (ref 0.0–0.5)
Eosinophils Relative: 1 %
HCT: 33.9 % — ABNORMAL LOW (ref 39.0–52.0)
Hemoglobin: 10.9 g/dL — ABNORMAL LOW (ref 13.0–17.0)
Immature Granulocytes: 3 %
Lymphocytes Relative: 17 %
Lymphs Abs: 1 10*3/uL (ref 0.7–4.0)
MCH: 29.6 pg (ref 26.0–34.0)
MCHC: 32.2 g/dL (ref 30.0–36.0)
MCV: 92.1 fL (ref 80.0–100.0)
Monocytes Absolute: 0.5 10*3/uL (ref 0.1–1.0)
Monocytes Relative: 9 %
Neutro Abs: 4.3 10*3/uL (ref 1.7–7.7)
Neutrophils Relative %: 70 %
Platelets: 286 10*3/uL (ref 150–400)
RBC: 3.68 MIL/uL — ABNORMAL LOW (ref 4.22–5.81)
RDW: 16.5 % — ABNORMAL HIGH (ref 11.5–15.5)
WBC: 6.1 10*3/uL (ref 4.0–10.5)
nRBC: 0.3 % — ABNORMAL HIGH (ref 0.0–0.2)

## 2021-08-28 LAB — TSH: TSH: 1.562 u[IU]/mL (ref 0.350–4.500)

## 2021-08-28 MED ORDER — SODIUM CHLORIDE 0.9 % IV SOLN
10.0000 mg | Freq: Once | INTRAVENOUS | Status: AC
  Administered 2021-08-28: 10 mg via INTRAVENOUS
  Filled 2021-08-28: qty 10

## 2021-08-28 MED ORDER — DIPHENHYDRAMINE HCL 50 MG/ML IJ SOLN
25.0000 mg | Freq: Once | INTRAMUSCULAR | Status: AC
  Administered 2021-08-28: 25 mg via INTRAVENOUS
  Filled 2021-08-28: qty 1

## 2021-08-28 MED ORDER — PALONOSETRON HCL INJECTION 0.25 MG/5ML
0.2500 mg | Freq: Once | INTRAVENOUS | Status: AC
  Administered 2021-08-28: 0.25 mg via INTRAVENOUS
  Filled 2021-08-28: qty 5

## 2021-08-28 MED ORDER — SODIUM CHLORIDE 0.9 % IV SOLN
150.0000 mg | Freq: Once | INTRAVENOUS | Status: AC
  Administered 2021-08-28: 150 mg via INTRAVENOUS
  Filled 2021-08-28: qty 150

## 2021-08-28 MED ORDER — SODIUM CHLORIDE 0.9 % IV SOLN
Freq: Once | INTRAVENOUS | Status: AC
  Filled 2021-08-28: qty 250

## 2021-08-28 MED ORDER — FAMOTIDINE IN NACL 20-0.9 MG/50ML-% IV SOLN
20.0000 mg | Freq: Once | INTRAVENOUS | Status: AC
  Administered 2021-08-28: 20 mg via INTRAVENOUS
  Filled 2021-08-28: qty 50

## 2021-08-28 MED ORDER — SODIUM CHLORIDE 0.9 % IV SOLN
80.0000 mg/m2 | Freq: Once | INTRAVENOUS | Status: AC
  Administered 2021-08-28: 168 mg via INTRAVENOUS
  Filled 2021-08-28: qty 28

## 2021-08-28 MED ORDER — SODIUM CHLORIDE 0.9 % IV SOLN
210.0000 mg | Freq: Once | INTRAVENOUS | Status: AC
  Administered 2021-08-28: 210 mg via INTRAVENOUS
  Filled 2021-08-28: qty 21

## 2021-08-28 MED ORDER — HEPARIN SOD (PORK) LOCK FLUSH 100 UNIT/ML IV SOLN
INTRAVENOUS | Status: AC
  Administered 2021-08-28: 500 [IU]
  Filled 2021-08-28: qty 5

## 2021-08-28 NOTE — Patient Instructions (Signed)
[facility]  Discharge Instructions: °Thank you for choosing [facility] to provide your oncology and hematology care.  °If you have a lab appointment with the Cancer Center, please go directly to the Cancer Center and check in at the registration area. ° °Wear comfortable clothing and clothing appropriate for easy access to any Portacath or PICC line.  ° °We strive to give you quality time with your provider. You may need to reschedule your appointment if you arrive late (15 or more minutes).  Arriving late affects you and other patients whose appointments are after yours.  Also, if you miss three or more appointments without notifying the office, you may be dismissed from the clinic at the provider’s discretion.    °  °For prescription refill requests, have your pharmacy contact our office and allow 72 hours for refills to be completed.   ° °Today you received the following chemotherapy and/or immunotherapy agents taxol, carboplatin    °  °To help prevent nausea and vomiting after your treatment, we encourage you to take your nausea medication as directed. ° °BELOW ARE SYMPTOMS THAT SHOULD BE REPORTED IMMEDIATELY: °*FEVER GREATER THAN 100.4 F (38 °C) OR HIGHER °*CHILLS OR SWEATING °*NAUSEA AND VOMITING THAT IS NOT CONTROLLED WITH YOUR NAUSEA MEDICATION °*UNUSUAL SHORTNESS OF BREATH °*UNUSUAL BRUISING OR BLEEDING °*URINARY PROBLEMS (pain or burning when urinating, or frequent urination) °*BOWEL PROBLEMS (unusual diarrhea, constipation, pain near the anus) °TENDERNESS IN MOUTH AND THROAT WITH OR WITHOUT PRESENCE OF ULCERS (sore throat, sores in mouth, or a toothache) °UNUSUAL RASH, SWELLING OR PAIN  °UNUSUAL VAGINAL DISCHARGE OR ITCHING  ° °Items with * indicate a potential emergency and should be followed up as soon as possible or go to the Emergency Department if any problems should occur. ° °Please show the CHEMOTHERAPY ALERT CARD or IMMUNOTHERAPY ALERT CARD at  check-in to the Emergency Department and triage nurse. ° °Should you have questions after your visit or need to cancel or reschedule your appointment, please contact [facility]  [phone] and follow the prompts.  Office hours are 8:00 a.m. to 4:30 p.m. Monday - Friday. Please note that voicemails left after 4:00 p.m. may not be returned until the following business day.  We are closed weekends and major holidays. You have access to a nurse at all times for urgent questions. Please call the main number to the clinic [phone] and follow the prompts. ° °For any non-urgent questions, you may also contact your provider using MyChart. We now offer e-Visits for anyone 18 and older to request care online for non-urgent symptoms. For details visit [email or web address]. °  °Also download the MyChart app! Go to the app store, search "MyChart", open the app, select , and log in with your MyChart username and password. ° °Due to Covid, a mask is required upon entering the hospital/clinic. If you do not have a mask, one will be given to you upon arrival. For doctor visits, patients may have 1 support person aged 18 or older with them. For treatment visits, patients cannot have anyone with them due to current Covid guidelines and our immunocompromised population.  °

## 2021-08-29 LAB — T4: T4, Total: 5.8 ug/dL (ref 4.5–12.0)

## 2021-09-06 NOTE — Progress Notes (Signed)
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  INTERVAL HISTORY: Patient returns to clinic today for further evaluation and consideration of cycle 2, day 1 of carboplatinum, Taxol, and Keytruda.  He is tolerating his treatments well without significant side effects.  He currently feels well and is asymptomatic.  He has a chronic peripheral neuropathy that is unchanged.  He has no other neurologic complaints.  He denies any pain today.  He denies any recent fevers or illnesses. He has no chest pain, shortness of breath, cough, or hemoptysis.  He denies any nausea, vomiting, constipation, or diarrhea.  He has no urinary complaints.  Patient offers no further specific complaints today.  REVIEW OF SYSTEMS:   Review of Systems  Constitutional: Negative.  Negative for fever, malaise/fatigue and weight loss.  Respiratory: Negative.  Negative for cough, hemoptysis and shortness of breath.   Cardiovascular: Negative.  Negative for chest pain and leg swelling.  Gastrointestinal: Negative.  Negative for abdominal pain.  Genitourinary: Negative.  Negative for dysuria.  Musculoskeletal: Negative.  Negative for neck pain.  Skin: Negative.  Negative for rash.  Neurological:  Positive for tingling. Negative for dizziness, focal weakness, weakness and headaches.  Psychiatric/Behavioral: Negative.  The patient is not nervous/anxious.    As per HPI. Otherwise, a complete review of systems is negative.  PAST MEDICAL HISTORY: Past Medical History:  Diagnosis Date   Cancer [facility])     Coronary artery disease    Hypertension     PAST SURGICAL HISTORY: Past Surgical History:  Procedure Laterality Date   CORONARY ARTERY BYPASS GRAFT  2010   Quadruple   DIRECT LARYNGOSCOPY  11/28/2020   Procedure: DIRECT LARYNGOSCOPY WITH BIOPSY;  Surgeon: Beverly Gust, MD;  Location: [facility] ORS;  Service: ENT;;   IR IMAGING GUIDED PORT INSERTION  01/01/2021   PULMONARY THROMBECTOMY Bilateral 01/13/2021   Procedure: PULMONARY THROMBECTOMY;  Surgeon: Katha Cabal, MD;  Location: [place] [facility];  Service: Cardiovascular;  Laterality: Bilateral;   TRACHEOSTOMY TUBE PLACEMENT N/A 11/28/2020   Procedure: AWAKE TRACHEOSTOMY;  Surgeon: Beverly Gust, MD;  Location: [facility] ORS;  Service: ENT;  Laterality: N/A;    FAMILY HISTORY: Family History  Problem Relation Age of Onset   Arthritis Mother    Heart attack Father    Brain cancer Sister     ADVANCED DIRECTIVES (Y/N):  N  HEALTH MAINTENANCE: Social History   Tobacco Use   Smoking status: Former    Types: Cigarettes    Quit date: 10/17/2008    Years since quitting: 12.9   Smokeless tobacco: Never  Vaping Use   Vaping Use: Never used  Substance Use Topics   Alcohol use: Yes    Comment: occasionally   Drug use: Never     Colonoscopy:  PAP:  Bone density:  Lipid panel:  No Known Allergies  Current Outpatient Medications  Medication Sig Dispense Refill   amLODipine (NORVASC) 10 MG tablet Take 10 mg by mouth daily.     apixaban (ELIQUIS) 5 MG TABS tablet Take 1 tablet (5 mg total) by mouth 2 (two) times daily. 60 tablet 3   ascorbic acid (VITAMIN C)  500 MG tablet Take by mouth.     betamethasone valerate ointment (VALISONE) 0.1 % Apply 1 application. topically 2 (two) times daily. For up to 2 weeks 30 g 0   cyanocobalamin 1000 MCG tablet Take by mouth.     DENTA 5000 PLUS 1.1 % CREA dental cream      gentamicin ointment (GARAMYCIN) 0.1 % Apply topically.     hydrALAZINE (APRESOLINE) 50 MG tablet Take 50 mg  by mouth in the morning and at bedtime. 1.5 tab BID     metoprolol succinate (TOPROL-XL) 25 MG 24 hr tablet Take 25 mg by mouth daily.     olmesartan (BENICAR) 40 MG tablet Take 40 mg by mouth daily.     predniSONE (DELTASONE) 10 MG tablet Take 6 tablets (60 mg total) by mouth daily with breakfast. Then start taper pack. 42 tablet 0   rosuvastatin (CRESTOR) 40 MG tablet Take 40 mg by mouth daily.     ALPRAZolam (XANAX) 0.5 MG tablet Take 1 tablet (0.5 mg total) by mouth daily as needed for anxiety (Take 1 hour prior to radiation). (Patient not taking: Reported on 04/14/2021) 30 tablet 1   fluticasone (FLONASE) 50 MCG/ACT nasal spray Place 1 spray into both nostrils daily. (Patient not taking: Reported on 03/24/2021) 15.8 mL 0   ipratropium-albuterol (DUONEB) 0.5-2.5 (3) MG/3ML SOLN Take 3 mLs by nebulization every 6 (six) hours. (Patient not taking: Reported on 03/24/2021) 360 mL 0   ondansetron (ZOFRAN) 8 MG tablet Take 1 tablet (8 mg total) by mouth every 8 (eight) hours as needed for nausea or vomiting. (Patient not taking: Reported on 04/14/2021) 60 tablet 2   prochlorperazine (COMPAZINE) 10 MG tablet Take 1 tablet (10 mg total) by mouth every 6 (six) hours as needed for nausea or vomiting. (Patient not taking: Reported on 04/14/2021) 60 tablet 2   No current facility-administered medications for this visit.   Facility-Administered Medications Ordered in Other Visits  Medication Dose Route Frequency Provider Last Rate Last Admin   heparin lock flush 100 UNIT/ML injection            heparin lock flush 100 unit/mL  500 Units Intravenous Once Lloyd Huger, MD       sodium chloride flush (NS) 0.9 % injection 10 mL  10 mL Intravenous PRN Lloyd Huger, MD   10 mL at 01/06/21 1305    OBJECTIVE: Vitals:   09/10/21 0948  BP: 122/79  Pulse: 83  Resp: 18  Temp: (!) 97.3 F (36.3 C)  SpO2: 100%      Body mass index is 29.55 kg/m.    ECOG FS:0 - Asymptomatic  General:  Well-developed, well-nourished, no acute distress. Eyes: Pink conjunctiva, anicteric sclera. HEENT: Normocephalic, moist mucous membranes.  Trach in place.  Minimally palpable lymphadenopathy. Lungs: No audible wheezing or coughing. Heart: Regular rate and rhythm. Abdomen: Soft, nontender, no obvious distention. Musculoskeletal: No edema, cyanosis, or clubbing. Neuro: Alert, answering all questions appropriately. Cranial nerves grossly intact. Skin: No rashes or petechiae noted. Psych: Normal affect.  LAB RESULTS:  Lab Results  Component Value Date   NA 136 09/10/2021   K 3.7 09/10/2021   CL 105 09/10/2021   CO2 25 09/10/2021   GLUCOSE 114 (H) 09/10/2021   BUN 21 09/10/2021   CREATININE 1.41 (H) 09/10/2021   CALCIUM 8.4 (L) 09/10/2021   PROT 6.7 09/10/2021   ALBUMIN 3.3 (L) 09/10/2021   AST 17 09/10/2021   ALT 20 09/10/2021   ALKPHOS 45  09/10/2021   BILITOT 0.4 09/10/2021   GFRNONAA 55 (L) 09/10/2021   GFRAA 37 (L) 11/13/2019    Lab Results  Component Value Date   WBC 2.9 (L) 09/10/2021   NEUTROABS 1.5 (L) 09/10/2021   HGB 10.1 (L) 09/10/2021   HCT 31.3 (L) 09/10/2021   MCV 94.0 09/10/2021   PLT 180 09/10/2021     STUDIES: No results found.  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  PLAN:    1.  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  Case discussed with [facility] head and neck physician and consensus was to restart chemotherapy with carboplatinum, Taxol, and Keytruda on a 21-day cycle.  Plan to do 4 cycles and then reimage.  Patient will require some sort of maintenance treatment more than just single agent Keytruda.  Possibly Keytruda and low-dose Taxol.  Proceed with cycle 2, day 1 of treatment today.  Because of the Memorial Day holiday, patient will return to clinic in 2  weeks for further evaluation and consideration of cycle 2, day 8.      2.  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  PET scan results from November 12, 2019 reviewed independently with no obvious evidence of recurrent or progressive disease.  Level 2 lymph node is no longer hypermetabolic.  3.  Renal insufficiency: Chronic and unchanged.  Patient's creatinine is 1.41 today. 4.  Anemia: Hemoglobin has trended down slightly to 10.1, monitor. 5.  Hypokalemia: Resolved. 6.  Pulmonary embolism: Diagnosed in September 2022.  Continue Eliquis as prescribed.  Patient will require a minimum of 6 months of treatment. 7.  Leukopenia: Mild, proceed with treatment as above.   8.  Peripheral neuropathy: Chronic and unchanged.  Possibly related to Taxol.  Monitor closely now but Taxol is being reinitiated. 9.  Trach: Continue follow-up with ENT as indicated.  Patient expressed understanding and was in agreement with this plan. He also understands that He can call clinic at any time with any questions, concerns, or complaints.    Cancer Staging  Malignant neoplasm of supraglottis [facility]) Staging form: Larynx - Supraglottis, AJCC 8th Edition - Clinical stage from 12/11/2020: Stage IVC (cT3, cN2b, cM1) - Signed by Lloyd Huger, MD on 01/26/2021  Primary squamous cell carcinoma of tonsil ([place]) Staging form: Pharynx - HPV-Mediated Oropharynx, AJCC 8th Edition - Clinical: No stage assigned - Unsigned   Lloyd Huger, MD   09/11/2021 8:41 AM

## 2021-09-09 MED FILL — Fosaprepitant Dimeglumine For IV Infusion 150 MG (Base Eq): INTRAVENOUS | Qty: 5 | Status: AC

## 2021-09-09 MED FILL — Dexamethasone Sodium Phosphate Inj 100 MG/10ML: INTRAMUSCULAR | Qty: 1 | Status: AC

## 2021-09-10 ENCOUNTER — Other Ambulatory Visit (HOSPITAL_COMMUNITY): Payer: Self-pay

## 2021-09-10 ENCOUNTER — Inpatient Hospital Stay (HOSPITAL_BASED_OUTPATIENT_CLINIC_OR_DEPARTMENT_OTHER): Payer: Medicare Other | Admitting: Oncology

## 2021-09-10 ENCOUNTER — Encounter: Payer: Self-pay | Admitting: Oncology

## 2021-09-10 ENCOUNTER — Inpatient Hospital Stay: Payer: Medicare Other

## 2021-09-10 VITALS — BP 122/79 | HR 83 | Temp 97.3°F | Resp 18 | Wt 200.1 lb

## 2021-09-10 DIAGNOSIS — C321 Malignant neoplasm of supraglottis: Secondary | ICD-10-CM

## 2021-09-10 DIAGNOSIS — Z5112 Encounter for antineoplastic immunotherapy: Secondary | ICD-10-CM | POA: Diagnosis not present

## 2021-09-10 LAB — CBC WITH DIFFERENTIAL/PLATELET
Abs Immature Granulocytes: 0.13 10*3/uL — ABNORMAL HIGH (ref 0.00–0.07)
Basophils Absolute: 0 10*3/uL (ref 0.0–0.1)
Basophils Relative: 1 %
Eosinophils Absolute: 0 10*3/uL (ref 0.0–0.5)
Eosinophils Relative: 1 %
HCT: 31.3 % — ABNORMAL LOW (ref 39.0–52.0)
Hemoglobin: 10.1 g/dL — ABNORMAL LOW (ref 13.0–17.0)
Immature Granulocytes: 4 %
Lymphocytes Relative: 25 %
Lymphs Abs: 0.7 10*3/uL (ref 0.7–4.0)
MCH: 30.3 pg (ref 26.0–34.0)
MCHC: 32.3 g/dL (ref 30.0–36.0)
MCV: 94 fL (ref 80.0–100.0)
Monocytes Absolute: 0.5 10*3/uL (ref 0.1–1.0)
Monocytes Relative: 18 %
Neutro Abs: 1.5 10*3/uL — ABNORMAL LOW (ref 1.7–7.7)
Neutrophils Relative %: 51 %
Platelets: 180 10*3/uL (ref 150–400)
RBC: 3.33 MIL/uL — ABNORMAL LOW (ref 4.22–5.81)
RDW: 16.7 % — ABNORMAL HIGH (ref 11.5–15.5)
WBC: 2.9 10*3/uL — ABNORMAL LOW (ref 4.0–10.5)
nRBC: 0 % (ref 0.0–0.2)

## 2021-09-10 LAB — COMPREHENSIVE METABOLIC PANEL
ALT: 20 U/L (ref 0–44)
AST: 17 U/L (ref 15–41)
Albumin: 3.3 g/dL — ABNORMAL LOW (ref 3.5–5.0)
Alkaline Phosphatase: 45 U/L (ref 38–126)
Anion gap: 6 (ref 5–15)
BUN: 21 mg/dL (ref 8–23)
CO2: 25 mmol/L (ref 22–32)
Calcium: 8.4 mg/dL — ABNORMAL LOW (ref 8.9–10.3)
Chloride: 105 mmol/L (ref 98–111)
Creatinine, Ser: 1.41 mg/dL — ABNORMAL HIGH (ref 0.61–1.24)
GFR, Estimated: 55 mL/min — ABNORMAL LOW (ref >60)
Glucose, Bld: 114 mg/dL — ABNORMAL HIGH (ref 70–99)
Potassium: 3.7 mmol/L (ref 3.5–5.1)
Sodium: 136 mmol/L (ref 135–145)
Total Bilirubin: 0.4 mg/dL (ref 0.3–1.2)
Total Protein: 6.7 g/dL (ref 6.5–8.1)

## 2021-09-10 LAB — TSH: TSH: 1.898 u[IU]/mL (ref 0.350–4.500)

## 2021-09-10 MED ORDER — SODIUM CHLORIDE 0.9 % IV SOLN
150.0000 mg | Freq: Once | INTRAVENOUS | Status: AC
  Administered 2021-09-10: 150 mg via INTRAVENOUS
  Filled 2021-09-10: qty 150

## 2021-09-10 MED ORDER — HEPARIN SOD (PORK) LOCK FLUSH 100 UNIT/ML IV SOLN
INTRAVENOUS | Status: AC
  Filled 2021-09-10: qty 5

## 2021-09-10 MED ORDER — SODIUM CHLORIDE 0.9 % IV SOLN
80.0000 mg/m2 | Freq: Once | INTRAVENOUS | Status: AC
  Administered 2021-09-10: 168 mg via INTRAVENOUS
  Filled 2021-09-10: qty 28

## 2021-09-10 MED ORDER — DIPHENHYDRAMINE HCL 50 MG/ML IJ SOLN
25.0000 mg | Freq: Once | INTRAMUSCULAR | Status: AC
  Administered 2021-09-10: 25 mg via INTRAVENOUS
  Filled 2021-09-10: qty 1

## 2021-09-10 MED ORDER — SODIUM CHLORIDE 0.9% FLUSH
10.0000 mL | INTRAVENOUS | Status: DC | PRN
  Administered 2021-09-10: 10 mL
  Filled 2021-09-10: qty 10

## 2021-09-10 MED ORDER — PALONOSETRON HCL INJECTION 0.25 MG/5ML
0.2500 mg | Freq: Once | INTRAVENOUS | Status: AC
  Administered 2021-09-10: 0.25 mg via INTRAVENOUS
  Filled 2021-09-10: qty 5

## 2021-09-10 MED ORDER — SODIUM CHLORIDE 0.9 % IV SOLN
10.0000 mg | Freq: Once | INTRAVENOUS | Status: AC
  Administered 2021-09-10: 10 mg via INTRAVENOUS
  Filled 2021-09-10: qty 10

## 2021-09-10 MED ORDER — HEPARIN SOD (PORK) LOCK FLUSH 100 UNIT/ML IV SOLN
500.0000 [IU] | Freq: Once | INTRAVENOUS | Status: AC | PRN
  Administered 2021-09-10: 500 [IU]
  Filled 2021-09-10: qty 5

## 2021-09-10 MED ORDER — SODIUM CHLORIDE 0.9 % IV SOLN
210.0000 mg | Freq: Once | INTRAVENOUS | Status: AC
  Administered 2021-09-10: 210 mg via INTRAVENOUS
  Filled 2021-09-10: qty 21

## 2021-09-10 MED ORDER — SODIUM CHLORIDE 0.9 % IV SOLN
Freq: Once | INTRAVENOUS | Status: AC
  Filled 2021-09-10: qty 250

## 2021-09-10 MED ORDER — FAMOTIDINE IN NACL 20-0.9 MG/50ML-% IV SOLN
20.0000 mg | Freq: Once | INTRAVENOUS | Status: AC
  Administered 2021-09-10: 20 mg via INTRAVENOUS
  Filled 2021-09-10: qty 50

## 2021-09-10 MED ORDER — SODIUM CHLORIDE 0.9 % IV SOLN
200.0000 mg | Freq: Once | INTRAVENOUS | Status: AC
  Administered 2021-09-10: 200 mg via INTRAVENOUS
  Filled 2021-09-10: qty 8

## 2021-09-10 MED ORDER — APIXABAN 5 MG PO TABS: 5.0000 mg | ORAL_TABLET | Freq: Two times a day (BID) | ORAL | 3 refills | Status: AC

## 2021-09-10 NOTE — Progress Notes (Signed)
Pt tolerated all infusions well today with no problems or complaints.  Pt left infusion suite stable and ambulatory.  

## 2021-09-10 NOTE — Progress Notes (Signed)
Patient denies any concerns today. Patient is requesting to be switched from xarelto to eliquis if possible.

## 2021-09-10 NOTE — Progress Notes (Signed)
Per MD keep current carboplatin dose of '210mg'$ 

## 2021-09-10 NOTE — Patient Instructions (Signed)
[facility] [place]  Discharge Instructions: Thank you for choosing [facility] to provide your oncology and hematology care.  If you have a lab appointment with the [facility], please go directly to the [facility] and check in at the registration area.  Wear comfortable clothing and clothing appropriate for easy access to any Portacath or PICC line.   We strive to give you quality time with your provider. You may need to reschedule your appointment if you arrive late (15 or more minutes).  Arriving late affects you and other patients whose appointments are after yours.  Also, if you miss three or more appointments without notifying the office, you may be dismissed from the clinic at the provider's discretion.      For prescription refill requests, have your pharmacy contact our office and allow 72 hours for refills to be completed.    Today you received the following chemotherapy and/or immunotherapy agents keytruda, taxol, carboplatin      To help prevent nausea and vomiting after your treatment, we encourage you to take your nausea medication as directed.  BELOW ARE SYMPTOMS THAT SHOULD BE REPORTED IMMEDIATELY: *FEVER GREATER THAN 100.4 F (38 C) OR HIGHER *CHILLS OR SWEATING *NAUSEA AND VOMITING THAT IS NOT CONTROLLED WITH YOUR NAUSEA MEDICATION *UNUSUAL SHORTNESS OF BREATH *UNUSUAL BRUISING OR BLEEDING *URINARY PROBLEMS (pain or burning when urinating, or frequent urination) *BOWEL PROBLEMS (unusual diarrhea, constipation, pain near the anus) TENDERNESS IN MOUTH AND THROAT WITH OR WITHOUT PRESENCE OF ULCERS (sore throat, sores in mouth, or a toothache) UNUSUAL RASH, SWELLING OR PAIN  UNUSUAL VAGINAL DISCHARGE OR ITCHING   Items with * indicate a potential emergency and should be followed up as soon as possible or go to the Emergency Department if any problems should occur.  Please show the CHEMOTHERAPY ALERT CARD or IMMUNOTHERAPY ALERT  CARD at check-in to the Emergency Department and triage nurse.  Should you have questions after your visit or need to cancel or reschedule your appointment, please contact [facility] [place] [facility] [place]  [phone] and follow the prompts.  Office hours are 8:00 a.m. to 4:30 p.m. Monday - Friday. Please note that voicemails left after 4:00 p.m. may not be returned until the following business day.  We are closed weekends and major holidays. You have access to a nurse at all times for urgent questions. Please call the main number to the clinic [phone] and follow the prompts.  For any non-urgent questions, you may also contact your provider using MyChart. We now offer e-Visits for anyone 34 and older to request care online for non-urgent symptoms. For details visit [email or web address].   Also download the MyChart app! Go to the app store, search "MyChart", open the app, select [facility], and log in with your MyChart username and password.  Due to Covid, a mask is required upon entering the hospital/clinic. If you do not have a mask, one will be given to you upon arrival. For doctor visits, patients may have 1 support person aged 54 or older with them. For treatment visits, patients cannot have anyone with them due to current Covid guidelines and our immunocompromised population.   Pembrolizumab injection What is this medication? PEMBROLIZUMAB (pem broe liz ue mab) is a monoclonal antibody. It is used to treat certain types of cancer. This medicine may be used for other purposes; ask your health care provider or pharmacist if you have questions. COMMON BRAND NAME(S): Keytruda What should I tell my  care team before I take this medication? They need to know if you have any of these conditions: autoimmune diseases like Crohn's disease, ulcerative colitis, or lupus have had or planning to have an allogeneic stem cell transplant (uses someone else's stem cells) history of organ  transplant history of chest radiation nervous system problems like myasthenia gravis or Guillain-Barre syndrome an unusual or allergic reaction to pembrolizumab, other medicines, foods, dyes, or preservatives pregnant or trying to get pregnant breast-feeding How should I use this medication? This medicine is for infusion into a vein. It is given by a health care professional in a hospital or clinic setting. A special MedGuide will be given to you before each treatment. Be sure to read this information carefully each time. Talk to your pediatrician regarding the use of this medicine in children. While this drug may be prescribed for children as young as 6 months for selected conditions, precautions do apply. Overdosage: If you think you have taken too much of this medicine contact a poison control center or emergency room at once. NOTE: This medicine is only for you. Do not share this medicine with others. What if I miss a dose? It is important not to miss your dose. Call your doctor or health care professional if you are unable to keep an appointment. What may interact with this medication? Interactions have not been studied. This list may not describe all possible interactions. Give your health care provider a list of all the medicines, herbs, non-prescription drugs, or dietary supplements you use. Also tell them if you smoke, drink alcohol, or use illegal drugs. Some items may interact with your medicine. What should I watch for while using this medication? Your condition will be monitored carefully while you are receiving this medicine. You may need blood work done while you are taking this medicine. Do not become pregnant while taking this medicine or for 4 months after stopping it. Women should inform their doctor if they wish to become pregnant or think they might be pregnant. There is a potential for serious side effects to an unborn child. Talk to your health care professional or  pharmacist for more information. Do not breast-feed an infant while taking this medicine or for 4 months after the last dose. What side effects may I notice from receiving this medication? Side effects that you should report to your doctor or health care professional as soon as possible: allergic reactions like skin rash, itching or hives, swelling of the face, lips, or tongue bloody or black, tarry breathing problems changes in vision chest pain chills confusion constipation cough diarrhea dizziness or feeling faint or lightheaded fast or irregular heartbeat fever flushing joint pain low blood counts - this medicine may decrease the number of white blood cells, red blood cells and platelets. You may be at increased risk for infections and bleeding. muscle pain muscle weakness pain, tingling, numbness in the hands or feet persistent headache redness, blistering, peeling or loosening of the skin, including inside the mouth signs and symptoms of high blood sugar such as dizziness; dry mouth; dry skin; fruity breath; nausea; stomach pain; increased hunger or thirst; increased urination signs and symptoms of kidney injury like trouble passing urine or change in the amount of urine signs and symptoms of liver injury like dark urine, light-colored stools, loss of appetite, nausea, right upper belly pain, yellowing of the eyes or skin sweating swollen lymph nodes weight loss Side effects that usually do not require medical attention (report to your  doctor or health care professional if they continue or are bothersome): decreased appetite hair loss tiredness This list may not describe all possible side effects. Call your doctor for medical advice about side effects. You may report side effects to FDA at 1-800-FDA-1088. Where should I keep my medication? This drug is given in a hospital or clinic and will not be stored at home. NOTE: This sheet is a summary. It may not cover all possible  information. If you have questions about this medicine, talk to your doctor, pharmacist, or health care provider.  2023 Elsevier/Gold Standard (2021-03-06 00:00:00)  Paclitaxel injection What is this medication? PACLITAXEL (PAK li TAX el) is a chemotherapy drug. It targets fast dividing cells, like cancer cells, and causes these cells to die. This medicine is used to treat ovarian cancer, breast cancer, lung cancer, Kaposi's sarcoma, and other cancers. This medicine may be used for other purposes; ask your health care provider or pharmacist if you have questions. COMMON BRAND NAME(S): Onxol, Taxol What should I tell my care team before I take this medication? They need to know if you have any of these conditions: history of irregular heartbeat liver disease low blood counts, like low white cell, platelet, or red cell counts lung or breathing disease, like asthma tingling of the fingers or toes, or other nerve disorder an unusual or allergic reaction to paclitaxel, alcohol, polyoxyethylated castor oil, other chemotherapy, other medicines, foods, dyes, or preservatives pregnant or trying to get pregnant breast-feeding How should I use this medication? This drug is given as an infusion into a vein. It is administered in a hospital or clinic by a specially trained health care professional. Talk to your pediatrician regarding the use of this medicine in children. Special care may be needed. Overdosage: If you think you have taken too much of this medicine contact a poison control center or emergency room at once. NOTE: This medicine is only for you. Do not share this medicine with others. What if I miss a dose? It is important not to miss your dose. Call your doctor or health care professional if you are unable to keep an appointment. What may interact with this medication? Do not take this medicine with any of the following medications: live virus vaccines This medicine may also interact  with the following medications: antiviral medicines for hepatitis, HIV or AIDS certain antibiotics like erythromycin and clarithromycin certain medicines for fungal infections like ketoconazole and itraconazole certain medicines for seizures like carbamazepine, phenobarbital, phenytoin gemfibrozil nefazodone rifampin St. John's wort This list may not describe all possible interactions. Give your health care provider a list of all the medicines, herbs, non-prescription drugs, or dietary supplements you use. Also tell them if you smoke, drink alcohol, or use illegal drugs. Some items may interact with your medicine. What should I watch for while using this medication? Your condition will be monitored carefully while you are receiving this medicine. You will need important blood work done while you are taking this medicine. This medicine can cause serious allergic reactions. To reduce your risk you will need to take other medicine(s) before treatment with this medicine. If you experience allergic reactions like skin rash, itching or hives, swelling of the face, lips, or tongue, tell your doctor or health care professional right away. In some cases, you may be given additional medicines to help with side effects. Follow all directions for their use. This drug may make you feel generally unwell. This is not uncommon, as chemotherapy can affect  healthy cells as well as cancer cells. Report any side effects. Continue your course of treatment even though you feel ill unless your doctor tells you to stop. Call your doctor or health care professional for advice if you get a fever, chills or sore throat, or other symptoms of a cold or flu. Do not treat yourself. This drug decreases your body's ability to fight infections. Try to avoid being around people who are sick. This medicine may increase your risk to bruise or bleed. Call your doctor or health care professional if you notice any unusual bleeding. Be  careful brushing and flossing your teeth or using a toothpick because you may get an infection or bleed more easily. If you have any dental work done, tell your dentist you are receiving this medicine. Avoid taking products that contain aspirin, acetaminophen, ibuprofen, naproxen, or ketoprofen unless instructed by your doctor. These medicines may hide a fever. Do not become pregnant while taking this medicine. Women should inform their doctor if they wish to become pregnant or think they might be pregnant. There is a potential for serious side effects to an unborn child. Talk to your health care professional or pharmacist for more information. Do not breast-feed an infant while taking this medicine. Men are advised not to father a child while receiving this medicine. This product may contain alcohol. Ask your pharmacist or healthcare provider if this medicine contains alcohol. Be sure to tell all healthcare providers you are taking this medicine. Certain medicines, like metronidazole and disulfiram, can cause an unpleasant reaction when taken with alcohol. The reaction includes flushing, headache, nausea, vomiting, sweating, and increased thirst. The reaction can last from 30 minutes to several hours. What side effects may I notice from receiving this medication? Side effects that you should report to your doctor or health care professional as soon as possible: allergic reactions like skin rash, itching or hives, swelling of the face, lips, or tongue breathing problems changes in vision fast, irregular heartbeat high or low blood pressure mouth sores pain, tingling, numbness in the hands or feet signs of decreased platelets or bleeding - bruising, pinpoint red spots on the skin, black, tarry stools, blood in the urine signs of decreased red blood cells - unusually weak or tired, feeling faint or lightheaded, falls signs of infection - fever or chills, cough, sore throat, pain or difficulty passing  urine signs and symptoms of liver injury like dark yellow or brown urine; general ill feeling or flu-like symptoms; light-colored stools; loss of appetite; nausea; right upper belly pain; unusually weak or tired; yellowing of the eyes or skin swelling of the ankles, feet, hands unusually slow heartbeat Side effects that usually do not require medical attention (report to your doctor or health care professional if they continue or are bothersome): diarrhea hair loss loss of appetite muscle or joint pain nausea, vomiting pain, redness, or irritation at site where injected tiredness This list may not describe all possible side effects. Call your doctor for medical advice about side effects. You may report side effects to FDA at 1-800-FDA-1088. Where should I keep my medication? This drug is given in a hospital or clinic and will not be stored at home. NOTE: This sheet is a summary. It may not cover all possible information. If you have questions about this medicine, talk to your doctor, pharmacist, or health care provider.  2023 Elsevier/Gold Standard (2021-03-06 00:00:00)  Carboplatin injection What is this medication? CARBOPLATIN (KAR boe pla tin) is a chemotherapy drug.  It targets fast dividing cells, like cancer cells, and causes these cells to die. This medicine is used to treat ovarian cancer and many other cancers. This medicine may be used for other purposes; ask your health care provider or pharmacist if you have questions. COMMON BRAND NAME(S): Paraplatin What should I tell my care team before I take this medication? They need to know if you have any of these conditions: blood disorders hearing problems kidney disease recent or ongoing radiation therapy an unusual or allergic reaction to carboplatin, cisplatin, other chemotherapy, other medicines, foods, dyes, or preservatives pregnant or trying to get pregnant breast-feeding How should I use this medication? This drug is  usually given as an infusion into a vein. It is administered in a hospital or clinic by a specially trained health care professional. Talk to your pediatrician regarding the use of this medicine in children. Special care may be needed. Overdosage: If you think you have taken too much of this medicine contact a poison control center or emergency room at once. NOTE: This medicine is only for you. Do not share this medicine with others. What if I miss a dose? It is important not to miss a dose. Call your doctor or health care professional if you are unable to keep an appointment. What may interact with this medication? medicines for seizures medicines to increase blood counts like filgrastim, pegfilgrastim, sargramostim some antibiotics like amikacin, gentamicin, neomycin, streptomycin, tobramycin vaccines Talk to your doctor or health care professional before taking any of these medicines: acetaminophen aspirin ibuprofen ketoprofen naproxen This list may not describe all possible interactions. Give your health care provider a list of all the medicines, herbs, non-prescription drugs, or dietary supplements you use. Also tell them if you smoke, drink alcohol, or use illegal drugs. Some items may interact with your medicine. What should I watch for while using this medication? Your condition will be monitored carefully while you are receiving this medicine. You will need important blood work done while you are taking this medicine. This drug may make you feel generally unwell. This is not uncommon, as chemotherapy can affect healthy cells as well as cancer cells. Report any side effects. Continue your course of treatment even though you feel ill unless your doctor tells you to stop. In some cases, you may be given additional medicines to help with side effects. Follow all directions for their use. Call your doctor or health care professional for advice if you get a fever, chills or sore throat, or  other symptoms of a cold or flu. Do not treat yourself. This drug decreases your body's ability to fight infections. Try to avoid being around people who are sick. This medicine may increase your risk to bruise or bleed. Call your doctor or health care professional if you notice any unusual bleeding. Be careful brushing and flossing your teeth or using a toothpick because you may get an infection or bleed more easily. If you have any dental work done, tell your dentist you are receiving this medicine. Avoid taking products that contain aspirin, acetaminophen, ibuprofen, naproxen, or ketoprofen unless instructed by your doctor. These medicines may hide a fever. Do not become pregnant while taking this medicine. Women should inform their doctor if they wish to become pregnant or think they might be pregnant. There is a potential for serious side effects to an unborn child. Talk to your health care professional or pharmacist for more information. Do not breast-feed an infant while taking this medicine. What side  effects may I notice from receiving this medication? Side effects that you should report to your doctor or health care professional as soon as possible: allergic reactions like skin rash, itching or hives, swelling of the face, lips, or tongue signs of infection - fever or chills, cough, sore throat, pain or difficulty passing urine signs of decreased platelets or bleeding - bruising, pinpoint red spots on the skin, black, tarry stools, nosebleeds signs of decreased red blood cells - unusually weak or tired, fainting spells, lightheadedness breathing problems changes in hearing changes in vision chest pain high blood pressure low blood counts - This drug may decrease the number of white blood cells, red blood cells and platelets. You may be at increased risk for infections and bleeding. nausea and vomiting pain, swelling, redness or irritation at the injection site pain, tingling, numbness in  the hands or feet problems with balance, talking, walking trouble passing urine or change in the amount of urine Side effects that usually do not require medical attention (report to your doctor or health care professional if they continue or are bothersome): hair loss loss of appetite metallic taste in the mouth or changes in taste This list may not describe all possible side effects. Call your doctor for medical advice about side effects. You may report side effects to FDA at 1-800-FDA-1088. Where should I keep my medication? This drug is given in a hospital or clinic and will not be stored at home. NOTE: This sheet is a summary. It may not cover all possible information. If you have questions about this medicine, talk to your doctor, pharmacist, or health care provider.  2023 Elsevier/Gold Standard (2007-09-13 00:00:00)

## 2021-09-11 ENCOUNTER — Encounter: Payer: Self-pay | Admitting: Oncology

## 2021-09-11 LAB — T4: T4, Total: 5.4 ug/dL (ref 4.5–12.0)

## 2021-09-21 NOTE — Progress Notes (Unsigned)
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  INTERVAL HISTORY: Patient returns to clinic today for further evaluation and consideration of cycle 2, day 8 of carboplatinum, Taxol, and Keytruda.  Carboplatinum and Taxol only today.  He continues to feel well and remains asymptomatic.  He is tolerating his treatments without significant side effects.  He has a chronic peripheral neuropathy that is unchanged.  He has no other neurologic complaints.  He denies any pain today.  He denies any recent fevers or illnesses. He has no chest pain, shortness of breath, cough, or hemoptysis.  He denies any nausea, vomiting, constipation, or diarrhea.  He has no urinary complaints.  Patient offers no further specific complaints today.  REVIEW OF SYSTEMS:   Review of Systems  Constitutional: Negative.  Negative for fever, malaise/fatigue and weight loss.  Respiratory: Negative.  Negative for cough, hemoptysis and shortness of breath.   Cardiovascular: Negative.  Negative for chest pain and leg swelling.  Gastrointestinal: Negative.  Negative for abdominal pain.  Genitourinary: Negative.  Negative for dysuria.  Musculoskeletal: Negative.  Negative for neck pain.  Skin:  Positive for itching and rash.  Neurological:  Positive for tingling. Negative for dizziness, focal weakness, weakness and headaches.  Psychiatric/Behavioral: Negative.  The patient is not nervous/anxious.    As per HPI. Otherwise, a complete review of systems is negative.  PAST MEDICAL HISTORY: Past Medical  History:  Diagnosis Date   Cancer [facility])    Coronary artery disease    Hypertension     PAST SURGICAL HISTORY: Past Surgical History:  Procedure Laterality Date   CORONARY ARTERY BYPASS GRAFT  2010   Quadruple   DIRECT LARYNGOSCOPY  11/28/2020   Procedure: DIRECT LARYNGOSCOPY WITH BIOPSY;  Surgeon: Beverly Gust, MD;  Location: [facility] ORS;  Service: ENT;;   IR IMAGING GUIDED PORT INSERTION  01/01/2021   PULMONARY THROMBECTOMY Bilateral 01/13/2021   Procedure: PULMONARY THROMBECTOMY;  Surgeon: Katha Cabal, MD;  Location: [place] [facility];  Service: Cardiovascular;  Laterality: Bilateral;   TRACHEOSTOMY TUBE PLACEMENT N/A 11/28/2020   Procedure: AWAKE TRACHEOSTOMY;  Surgeon: Beverly Gust, MD;  Location: [facility] ORS;  Service: ENT;  Laterality: N/A;    FAMILY HISTORY: Family History  Problem Relation Age of Onset   Arthritis Mother    Heart attack Father    Brain cancer Sister     ADVANCED DIRECTIVES (Y/N):  N  HEALTH MAINTENANCE: Social History   Tobacco Use   Smoking status: Former    Types: Cigarettes    Quit date: 10/17/2008    Years since quitting: 12.9   Smokeless tobacco: Never  Vaping Use   Vaping Use: Never used  Substance Use Topics   Alcohol use: Yes    Comment: occasionally   Drug use: Never     Colonoscopy:  PAP:  Bone density:  Lipid panel:  No Known Allergies  Current Outpatient Medications  Medication Sig Dispense Refill   amLODipine (NORVASC) 10 MG tablet Take 10 mg by mouth daily.     apixaban (ELIQUIS) 5 MG TABS tablet Take 1 tablet (5 mg total) by mouth 2 (two) times daily. 60 tablet  3   ascorbic acid (VITAMIN C) 500 MG tablet Take by mouth.     cyanocobalamin 1000 MCG tablet Take by mouth.     DENTA 5000 PLUS 1.1 % CREA dental cream      gentamicin ointment (GARAMYCIN) 0.1 % Apply topically.     hydrALAZINE (APRESOLINE) 50 MG tablet Take 50 mg by mouth in the morning and at bedtime. 1.5 tab BID     metoprolol succinate  (TOPROL-XL) 25 MG 24 hr tablet Take 25 mg by mouth daily.     olmesartan (BENICAR) 40 MG tablet Take 40 mg by mouth daily.     predniSONE (STERAPRED UNI-PAK 21 TAB) 10 MG (21) TBPK tablet 6 tablets (60 mg total) by mouth first day, 5 tablets second day, 4 tablets third day, 3 tablets fourth day, 2 fifth day and 1 tablet last day for a total of 21 tablets 21 tablet 0   rosuvastatin (CRESTOR) 40 MG tablet Take 40 mg by mouth daily.     ALPRAZolam (XANAX) 0.5 MG tablet Take 1 tablet (0.5 mg total) by mouth daily as needed for anxiety (Take 1 hour prior to radiation). (Patient not taking: Reported on 04/14/2021) 30 tablet 1   betamethasone valerate ointment (VALISONE) 0.1 % Apply 1 application. topically 2 (two) times daily. For up to 2 weeks (Patient not taking: Reported on 09/23/2021) 30 g 0   fluticasone (FLONASE) 50 MCG/ACT nasal spray Place 1 spray into both nostrils daily. (Patient not taking: Reported on 03/24/2021) 15.8 mL 0   ipratropium-albuterol (DUONEB) 0.5-2.5 (3) MG/3ML SOLN Take 3 mLs by nebulization every 6 (six) hours. (Patient not taking: Reported on 03/24/2021) 360 mL 0   ondansetron (ZOFRAN) 8 MG tablet Take 1 tablet (8 mg total) by mouth every 8 (eight) hours as needed for nausea or vomiting. (Patient not taking: Reported on 04/14/2021) 60 tablet 2   predniSONE (DELTASONE) 10 MG tablet Take 6 tablets (60 mg total) by mouth daily with breakfast. Then start taper pack. (Patient not taking: Reported on 09/23/2021) 42 tablet 0   prochlorperazine (COMPAZINE) 10 MG tablet Take 1 tablet (10 mg total) by mouth every 6 (six) hours as needed for nausea or vomiting. (Patient not taking: Reported on 09/23/2021) 60 tablet 2   No current facility-administered medications for this visit.   Facility-Administered Medications Ordered in Other Visits  Medication Dose Route Frequency Provider Last Rate Last Admin   heparin lock flush 100 UNIT/ML injection            heparin lock flush 100 unit/mL  500 Units  Intravenous Once Lloyd Huger, MD       sodium chloride flush (NS) 0.9 % injection 10 mL  10 mL Intravenous PRN Lloyd Huger, MD   10 mL at 01/06/21 1305    OBJECTIVE: Vitals:   09/23/21 0948  BP: (!) 155/85  Pulse: 77  Resp: 16  Temp: (!) 96.8 F (36 C)  SpO2: 99%      Body mass index is 30.42 kg/m.    ECOG FS:0 - Asymptomatic  General: Well-developed, well-nourished, no acute distress. Eyes: Pink conjunctiva, anicteric sclera. HEENT: Normocephalic, moist mucous membranes.  Trach in place. Lungs: No audible wheezing or coughing. Heart: Regular rate and rhythm. Abdomen: Soft, nontender, no obvious distention. Musculoskeletal: No edema, cyanosis, or clubbing. Neuro: Alert, answering all questions appropriately. Cranial nerves grossly intact. Skin: No rashes or petechiae noted. Psych: Normal affect.  LAB RESULTS:  Lab Results  Component Value Date  NA 136 09/23/2021   K 3.5 09/23/2021   CL 105 09/23/2021   CO2 25 09/23/2021   GLUCOSE 97 09/23/2021   BUN 22 09/23/2021   CREATININE 1.28 (H) 09/23/2021   CALCIUM 8.3 (L) 09/23/2021   PROT 6.8 09/23/2021   ALBUMIN 3.4 (L) 09/23/2021   AST 16 09/23/2021   ALT 15 09/23/2021   ALKPHOS 41 09/23/2021   BILITOT 0.3 09/23/2021   GFRNONAA >60 09/23/2021   GFRAA 37 (L) 11/13/2019    Lab Results  Component Value Date   WBC 3.2 (L) 09/23/2021   NEUTROABS 1.9 09/23/2021   HGB 9.9 (L) 09/23/2021   HCT 31.0 (L) 09/23/2021   MCV 96.6 09/23/2021   PLT 184 09/23/2021     STUDIES: No results found.  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  PLAN:    1.  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  Case discussed with [facility] head and neck physician and consensus was to restart chemotherapy with carboplatinum, Taxol, and  Keytruda on a 21-day cycle.  Plan to do 4 cycles and then reimage.  Patient will require some sort of maintenance treatment more than just single agent Keytruda.  Possibly Keytruda and low-dose Taxol.  Proceed with cycle 2, day 8 of treatment today.  Return to clinic in 2 weeks for further evaluation and consideration of cycle 3, day 1.     2.  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  PET scan results from November 12, 2019 reviewed independently with no obvious evidence of recurrent or progressive disease.  Level 2 lymph node is no longer hypermetabolic.  3.  Renal insufficiency: Improved.  Patient's creatinine is 1.28.   4.  Anemia: Chronic and unchanged.  Patient's hemoglobin is 9.9. 5.  Hypokalemia: Resolved. 6.  Pulmonary embolism: Diagnosed in September 2022.  Continue Eliquis as prescribed.  Patient will require a minimum of 6 months of treatment. 7.  Leukopenia: Chronic and unchanged.  Proceed with treatment as above.   8.  Peripheral neuropathy: Chronic and unchanged.  Possibly related to Taxol.  Monitor closely now but Taxol is being reinitiated. 9.  Trach: Continue follow-up with ENT as indicated. 10.  Rash: Did not improve with topical steroids, therefore patient was given a prednisone taper.  Patient expressed understanding and was in agreement with this plan. He also understands that He can call clinic at any time with any questions, concerns, or complaints.    Cancer Staging  Malignant neoplasm of supraglottis [facility]) Staging form: Larynx - Supraglottis, AJCC 8th Edition - Clinical stage from 12/11/2020: Stage IVC (cT3, cN2b, cM1) - Signed by Lloyd Huger, MD on 01/26/2021  Primary squamous cell carcinoma of tonsil ([place]) Staging form: Pharynx - HPV-Mediated Oropharynx, AJCC 8th Edition - Clinical: No stage assigned - Unsigned   Lloyd Huger, MD   09/23/2021 3:27 PM

## 2021-09-23 ENCOUNTER — Inpatient Hospital Stay: Payer: Medicare Other

## 2021-09-23 ENCOUNTER — Inpatient Hospital Stay (HOSPITAL_BASED_OUTPATIENT_CLINIC_OR_DEPARTMENT_OTHER): Payer: Medicare Other | Admitting: Oncology

## 2021-09-23 ENCOUNTER — Inpatient Hospital Stay: Payer: Medicare Other | Attending: Oncology

## 2021-09-23 ENCOUNTER — Encounter: Payer: Self-pay | Admitting: Oncology

## 2021-09-23 VITALS — BP 155/85 | HR 77 | Temp 96.8°F | Resp 16 | Ht 69.0 in | Wt 206.0 lb

## 2021-09-23 DIAGNOSIS — Z87891 Personal history of nicotine dependence: Secondary | ICD-10-CM | POA: Diagnosis not present

## 2021-09-23 DIAGNOSIS — Z5112 Encounter for antineoplastic immunotherapy: Secondary | ICD-10-CM | POA: Insufficient documentation

## 2021-09-23 DIAGNOSIS — N289 Disorder of kidney and ureter, unspecified: Secondary | ICD-10-CM | POA: Insufficient documentation

## 2021-09-23 DIAGNOSIS — D72819 Decreased white blood cell count, unspecified: Secondary | ICD-10-CM | POA: Diagnosis not present

## 2021-09-23 DIAGNOSIS — Z930 Tracheostomy status: Secondary | ICD-10-CM | POA: Insufficient documentation

## 2021-09-23 DIAGNOSIS — R21 Rash and other nonspecific skin eruption: Secondary | ICD-10-CM | POA: Insufficient documentation

## 2021-09-23 DIAGNOSIS — I129 Hypertensive chronic kidney disease with stage 1 through stage 4 chronic kidney disease, or unspecified chronic kidney disease: Secondary | ICD-10-CM | POA: Insufficient documentation

## 2021-09-23 DIAGNOSIS — Z79899 Other long term (current) drug therapy: Secondary | ICD-10-CM | POA: Diagnosis not present

## 2021-09-23 DIAGNOSIS — C321 Malignant neoplasm of supraglottis: Secondary | ICD-10-CM | POA: Diagnosis present

## 2021-09-23 DIAGNOSIS — Z629 Problem related to upbringing, unspecified: Secondary | ICD-10-CM | POA: Diagnosis not present

## 2021-09-23 DIAGNOSIS — I2699 Other pulmonary embolism without acute cor pulmonale: Secondary | ICD-10-CM | POA: Insufficient documentation

## 2021-09-23 DIAGNOSIS — D649 Anemia, unspecified: Secondary | ICD-10-CM | POA: Diagnosis not present

## 2021-09-23 DIAGNOSIS — Z7952 Long term (current) use of systemic steroids: Secondary | ICD-10-CM | POA: Insufficient documentation

## 2021-09-23 DIAGNOSIS — G629 Polyneuropathy, unspecified: Secondary | ICD-10-CM | POA: Diagnosis not present

## 2021-09-23 DIAGNOSIS — Z7901 Long term (current) use of anticoagulants: Secondary | ICD-10-CM | POA: Diagnosis not present

## 2021-09-23 DIAGNOSIS — Z5111 Encounter for antineoplastic chemotherapy: Secondary | ICD-10-CM | POA: Diagnosis present

## 2021-09-23 LAB — COMPREHENSIVE METABOLIC PANEL
ALT: 15 U/L (ref 0–44)
AST: 16 U/L (ref 15–41)
Albumin: 3.4 g/dL — ABNORMAL LOW (ref 3.5–5.0)
Alkaline Phosphatase: 41 U/L (ref 38–126)
Anion gap: 6 (ref 5–15)
BUN: 22 mg/dL (ref 8–23)
CO2: 25 mmol/L (ref 22–32)
Calcium: 8.3 mg/dL — ABNORMAL LOW (ref 8.9–10.3)
Chloride: 105 mmol/L (ref 98–111)
Creatinine, Ser: 1.28 mg/dL — ABNORMAL HIGH (ref 0.61–1.24)
GFR, Estimated: >60 mL/min (ref >60)
Glucose, Bld: 97 mg/dL (ref 70–99)
Potassium: 3.5 mmol/L (ref 3.5–5.1)
Sodium: 136 mmol/L (ref 135–145)
Total Bilirubin: 0.3 mg/dL (ref 0.3–1.2)
Total Protein: 6.8 g/dL (ref 6.5–8.1)

## 2021-09-23 LAB — CBC WITH DIFFERENTIAL/PLATELET
Abs Immature Granulocytes: 0.03 10*3/uL (ref 0.00–0.07)
Basophils Absolute: 0 10*3/uL (ref 0.0–0.1)
Basophils Relative: 1 %
Eosinophils Absolute: 0.1 10*3/uL (ref 0.0–0.5)
Eosinophils Relative: 2 %
HCT: 31 % — ABNORMAL LOW (ref 39.0–52.0)
Hemoglobin: 9.9 g/dL — ABNORMAL LOW (ref 13.0–17.0)
Immature Granulocytes: 1 %
Lymphocytes Relative: 18 %
Lymphs Abs: 0.6 10*3/uL — ABNORMAL LOW (ref 0.7–4.0)
MCH: 30.8 pg (ref 26.0–34.0)
MCHC: 31.9 g/dL (ref 30.0–36.0)
MCV: 96.6 fL (ref 80.0–100.0)
Monocytes Absolute: 0.6 10*3/uL (ref 0.1–1.0)
Monocytes Relative: 20 %
Neutro Abs: 1.9 10*3/uL (ref 1.7–7.7)
Neutrophils Relative %: 58 %
Platelets: 184 10*3/uL (ref 150–400)
RBC: 3.21 MIL/uL — ABNORMAL LOW (ref 4.22–5.81)
RDW: 17.5 % — ABNORMAL HIGH (ref 11.5–15.5)
WBC: 3.2 10*3/uL — ABNORMAL LOW (ref 4.0–10.5)
nRBC: 0 % (ref 0.0–0.2)

## 2021-09-23 LAB — TSH: TSH: 3.109 u[IU]/mL (ref 0.350–4.500)

## 2021-09-23 MED ORDER — HEPARIN SOD (PORK) LOCK FLUSH 100 UNIT/ML IV SOLN
500.0000 [IU] | Freq: Once | INTRAVENOUS | Status: AC | PRN
  Filled 2021-09-23: qty 5

## 2021-09-23 MED ORDER — DIPHENHYDRAMINE HCL 50 MG/ML IJ SOLN
25.0000 mg | Freq: Once | INTRAMUSCULAR | Status: AC
  Administered 2021-09-23: 25 mg via INTRAVENOUS
  Filled 2021-09-23: qty 1

## 2021-09-23 MED ORDER — SODIUM CHLORIDE 0.9 % IV SOLN
201.6000 mg | Freq: Once | INTRAVENOUS | Status: AC
  Administered 2021-09-23: 200 mg via INTRAVENOUS
  Filled 2021-09-23: qty 20

## 2021-09-23 MED ORDER — PALONOSETRON HCL INJECTION 0.25 MG/5ML
0.2500 mg | Freq: Once | INTRAVENOUS | Status: AC
  Administered 2021-09-23: 0.25 mg via INTRAVENOUS
  Filled 2021-09-23: qty 5

## 2021-09-23 MED ORDER — PREDNISONE 10 MG (21) PO TBPK: ORAL_TABLET | ORAL | 0 refills | Status: DC

## 2021-09-23 MED ORDER — SODIUM CHLORIDE 0.9 % IV SOLN
Freq: Once | INTRAVENOUS | Status: AC
  Filled 2021-09-23: qty 250

## 2021-09-23 MED ORDER — FAMOTIDINE IN NACL 20-0.9 MG/50ML-% IV SOLN
20.0000 mg | Freq: Once | INTRAVENOUS | Status: AC
  Administered 2021-09-23: 20 mg via INTRAVENOUS
  Filled 2021-09-23: qty 50

## 2021-09-23 MED ORDER — HEPARIN SOD (PORK) LOCK FLUSH 100 UNIT/ML IV SOLN
INTRAVENOUS | Status: AC
  Administered 2021-09-23: 500 [IU]
  Filled 2021-09-23: qty 5

## 2021-09-23 MED ORDER — SODIUM CHLORIDE 0.9 % IV SOLN
80.0000 mg/m2 | Freq: Once | INTRAVENOUS | Status: AC
  Administered 2021-09-23: 168 mg via INTRAVENOUS
  Filled 2021-09-23: qty 28

## 2021-09-23 MED ORDER — SODIUM CHLORIDE 0.9 % IV SOLN
10.0000 mg | Freq: Once | INTRAVENOUS | Status: AC
  Administered 2021-09-23: 10 mg via INTRAVENOUS
  Filled 2021-09-23: qty 10

## 2021-09-23 MED ORDER — SODIUM CHLORIDE 0.9 % IV SOLN
150.0000 mg | Freq: Once | INTRAVENOUS | Status: AC
  Administered 2021-09-23: 150 mg via INTRAVENOUS
  Filled 2021-09-23: qty 150

## 2021-09-23 NOTE — Patient Instructions (Signed)
[facility]  Discharge Instructions: °Thank you for choosing [facility] to provide your oncology and hematology care.  °If you have a lab appointment with the Cancer Center, please go directly to the Cancer Center and check in at the registration area. ° °Wear comfortable clothing and clothing appropriate for easy access to any Portacath or PICC line.  ° °We strive to give you quality time with your provider. You may need to reschedule your appointment if you arrive late (15 or more minutes).  Arriving late affects you and other patients whose appointments are after yours.  Also, if you miss three or more appointments without notifying the office, you may be dismissed from the clinic at the provider’s discretion.    °  °For prescription refill requests, have your pharmacy contact our office and allow 72 hours for refills to be completed.   ° °Today you received the following chemotherapy and/or immunotherapy agents : Taxol / Carboplatin   °  °To help prevent nausea and vomiting after your treatment, we encourage you to take your nausea medication as directed. ° °BELOW ARE SYMPTOMS THAT SHOULD BE REPORTED IMMEDIATELY: °*FEVER GREATER THAN 100.4 F (38 °C) OR HIGHER °*CHILLS OR SWEATING °*NAUSEA AND VOMITING THAT IS NOT CONTROLLED WITH YOUR NAUSEA MEDICATION °*UNUSUAL SHORTNESS OF BREATH °*UNUSUAL BRUISING OR BLEEDING °*URINARY PROBLEMS (pain or burning when urinating, or frequent urination) °*BOWEL PROBLEMS (unusual diarrhea, constipation, pain near the anus) °TENDERNESS IN MOUTH AND THROAT WITH OR WITHOUT PRESENCE OF ULCERS (sore throat, sores in mouth, or a toothache) °UNUSUAL RASH, SWELLING OR PAIN  °UNUSUAL VAGINAL DISCHARGE OR ITCHING  ° °Items with * indicate a potential emergency and should be followed up as soon as possible or go to the Emergency Department if any problems should occur. ° °Please show the CHEMOTHERAPY ALERT CARD or IMMUNOTHERAPY ALERT CARD at  check-in to the Emergency Department and triage nurse. ° °Should you have questions after your visit or need to cancel or reschedule your appointment, please contact [facility]  [phone] and follow the prompts.  Office hours are 8:00 a.m. to 4:30 p.m. Monday - Friday. Please note that voicemails left after 4:00 p.m. may not be returned until the following business day.  We are closed weekends and major holidays. You have access to a nurse at all times for urgent questions. Please call the main number to the clinic [phone] and follow the prompts. ° °For any non-urgent questions, you may also contact your provider using MyChart. We now offer e-Visits for anyone 18 and older to request care online for non-urgent symptoms. For details visit [email or web address]. °  °Also download the MyChart app! Go to the app store, search "MyChart", open the app, select [facility], and log in with your MyChart username and password. ° °Due to Covid, a mask is required upon entering the hospital/clinic. If you do not have a mask, one will be given to you upon arrival. For doctor visits, patients may have 1 support person aged 18 or older with them. For treatment visits, patients cannot have anyone with them due to current Covid guidelines and our immunocompromised population.  °

## 2021-09-24 LAB — T4: T4, Total: 6.5 ug/dL (ref 4.5–12.0)

## 2021-10-02 NOTE — Progress Notes (Unsigned)
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  INTERVAL HISTORY: Patient returns to clinic today for further evaluation and consideration of cycle 3, day 1 of carboplatinum, Taxol, and Keytruda.  Scott Harding continues to tolerate his treatments well without significant side effects.  Scott Harding currently feels well and is asymptomatic. Scott Harding has a chronic peripheral neuropathy that is unchanged.  Scott Harding has no other neurologic complaints.  Scott Harding denies any pain today.  Scott Harding denies any recent fevers or illnesses. Scott Harding has no chest pain, shortness of breath, cough, or hemoptysis.  Scott Harding denies any nausea, vomiting, constipation, or diarrhea.  Scott Harding has no urinary complaints.  Patient offers no further specific complaints today.  REVIEW OF SYSTEMS:   Review of Systems  Constitutional: Negative.  Negative for fever, malaise/fatigue and weight loss.  Respiratory: Negative.  Negative for cough, hemoptysis and shortness of breath.   Cardiovascular: Negative.  Negative for chest pain and leg swelling.  Gastrointestinal: Negative.  Negative for abdominal pain.  Genitourinary: Negative.  Negative for dysuria.  Musculoskeletal: Negative.  Negative for neck pain.  Skin: Negative.  Negative for itching and rash.  Neurological:  Positive for tingling. Negative for dizziness, focal weakness, weakness and headaches.  Psychiatric/Behavioral: Negative.  The patient is not nervous/anxious.     As per HPI. Otherwise, a complete review of systems is negative.  PAST MEDICAL HISTORY: Past Medical History:  Diagnosis  Date   Cancer [facility])    Coronary artery disease    Hypertension     PAST SURGICAL HISTORY: Past Surgical History:  Procedure Laterality Date   CORONARY ARTERY BYPASS GRAFT  2010   Quadruple   DIRECT LARYNGOSCOPY  11/28/2020   Procedure: DIRECT LARYNGOSCOPY WITH BIOPSY;  Surgeon: Beverly Gust, MD;  Location: [facility];  Service: ENT;;   IR IMAGING GUIDED PORT INSERTION  01/01/2021   PULMONARY THROMBECTOMY Bilateral 01/13/2021   Procedure: PULMONARY THROMBECTOMY;  Surgeon: Katha Cabal, MD;  Location: [place] [facility];  Service: Cardiovascular;  Laterality: Bilateral;   TRACHEOSTOMY TUBE PLACEMENT N/A 11/28/2020   Procedure: AWAKE TRACHEOSTOMY;  Surgeon: Beverly Gust, MD;  Location: [facility];  Service: ENT;  Laterality: N/A;    FAMILY HISTORY: Family History  Problem Relation Age of Onset   Arthritis Mother    Heart attack Father    Brain cancer Sister     ADVANCED DIRECTIVES (Y/N):  N  HEALTH MAINTENANCE: Social History   Tobacco Use   Smoking status: Former    Types: Cigarettes    Quit date: 10/17/2008    Years since quitting: 12.9   Smokeless tobacco: Never  Vaping Use   Vaping Use: Never used  Substance Use Topics   Alcohol use: Yes    Comment: occasionally   Drug use: Never     Colonoscopy:  PAP:  Bone density:  Lipid panel:  No Known Allergies  Current Outpatient Medications  Medication Sig Dispense Refill   amLODipine (NORVASC) 10 MG tablet Take 10 mg by mouth daily.     apixaban (ELIQUIS) 5 MG TABS tablet Take 1 tablet (5 mg total) by mouth 2 (two) times daily. 60 tablet 3   ascorbic  acid (VITAMIN C) 500 MG tablet Take by mouth.     cyanocobalamin 1000 MCG tablet Take by mouth.     DENTA 5000 PLUS 1.1 % CREA dental cream      fluticasone (FLONASE) 50 MCG/ACT nasal spray Place 1 spray into both nostrils daily. 15.8 mL 0   gentamicin ointment (GARAMYCIN) 0.1 % Apply topically.     hydrALAZINE (APRESOLINE) 50 MG tablet Take 50 mg by mouth in  the morning and at bedtime. 1.5 tab BID     metoprolol succinate (TOPROL-XL) 25 MG 24 hr tablet Take 25 mg by mouth daily.     olmesartan (BENICAR) 40 MG tablet Take 40 mg by mouth daily.     rosuvastatin (CRESTOR) 40 MG tablet Take 40 mg by mouth daily.     ALPRAZolam (XANAX) 0.5 MG tablet Take 1 tablet (0.5 mg total) by mouth daily as needed for anxiety (Take 1 hour prior to radiation). (Patient not taking: Reported on 04/14/2021) 30 tablet 1   betamethasone valerate ointment (VALISONE) 0.1 % Apply 1 application. topically 2 (two) times daily. For up to 2 weeks (Patient not taking: Reported on 09/23/2021) 30 g 0   ipratropium-albuterol (DUONEB) 0.5-2.5 (3) MG/3ML SOLN Take 3 mLs by nebulization every 6 (six) hours. (Patient not taking: Reported on 03/24/2021) 360 mL 0   ondansetron (ZOFRAN) 8 MG tablet Take 1 tablet (8 mg total) by mouth every 8 (eight) hours as needed for nausea or vomiting. (Patient not taking: Reported on 04/14/2021) 60 tablet 2   predniSONE (DELTASONE) 10 MG tablet Take 6 tablets (60 mg total) by mouth daily with breakfast. Then start taper pack. (Patient not taking: Reported on 09/23/2021) 42 tablet 0   predniSONE (STERAPRED UNI-PAK 21 TAB) 10 MG (21) TBPK tablet 6 tablets (60 mg total) by mouth first day, 5 tablets second day, 4 tablets third day, 3 tablets fourth day, 2 fifth day and 1 tablet last day for a total of 21 tablets (Patient not taking: Reported on 10/07/2021) 21 tablet 0   prochlorperazine (COMPAZINE) 10 MG tablet Take 1 tablet (10 mg total) by mouth every 6 (six) hours as needed for nausea or vomiting. (Patient not taking: Reported on 09/23/2021) 60 tablet 2   No current facility-administered medications for this visit.   Facility-Administered Medications Ordered in Other Visits  Medication Dose Route Frequency Provider Last Rate Last Admin   CARBOplatin (PARAPLATIN) 200 mg in sodium chloride 0.9 % 100 mL chemo infusion  200 mg Intravenous Once Lloyd Huger, MD        heparin lock flush 100 UNIT/ML injection            heparin lock flush 100 unit/mL  500 Units Intravenous Once Grayland Ormond, Kathlene November, MD       [COMPLETED] heparin lock flush 100 unit/mL  500 Units Intracatheter Once PRN Lloyd Huger, MD   500 Units at 10/07/21 1330   sodium chloride flush (NS) 0.9 % injection 10 mL  10 mL Intravenous PRN Lloyd Huger, MD   10 mL at 01/06/21 1305    OBJECTIVE: Vitals:   10/07/21 0823  BP: 139/76  Pulse: 73  Resp: 20  Temp: 98.7 F (37.1 C)  SpO2: 100%      Body mass index is 30.91 kg/m.    ECOG FS:0 - Asymptomatic  General: Well-developed, well-nourished, no acute distress. Eyes: Pink conjunctiva, anicteric sclera. HEENT: Normocephalic, moist mucous membranes.  Trach in place.  No palpable lymphadenopathy. Lungs: No audible  wheezing or coughing. Heart: Regular rate and rhythm. Abdomen: Soft, nontender, no obvious distention. Musculoskeletal: No edema, cyanosis, or clubbing. Neuro: Alert, answering all questions appropriately. Cranial nerves grossly intact. Skin: No rashes or petechiae noted. Psych: Normal affect.   LAB RESULTS:  Lab Results  Component Value Date   NA 138 10/07/2021   K 3.6 10/07/2021   CL 105 10/07/2021   CO2 25 10/07/2021   GLUCOSE 106 (H) 10/07/2021   BUN 20 10/07/2021   CREATININE 1.28 (H) 10/07/2021   CALCIUM 8.7 (L) 10/07/2021   PROT 6.8 10/07/2021   ALBUMIN 3.5 10/07/2021   AST 15 10/07/2021   ALT 17 10/07/2021   ALKPHOS 38 10/07/2021   BILITOT 0.5 10/07/2021   GFRNONAA >60 10/07/2021   GFRAA 37 (L) 11/13/2019    Lab Results  Component Value Date   WBC 4.8 10/07/2021   NEUTROABS 2.9 10/07/2021   HGB 10.9 (L) 10/07/2021   HCT 33.0 (L) 10/07/2021   MCV 97.1 10/07/2021   PLT 154 10/07/2021     STUDIES: No results found.  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  PLAN:    1.  Recurrent stage IVc squamous cell carcinoma of the  supraglottis, p16 positive: Patient now has rapidly progressive metastatic recurrence of disease and required replacement of his trach.  Case discussed with [facility] head and neck physician, Dr. Ernst Bowler, and consensus was to restart chemotherapy with carboplatinum, Taxol, and Keytruda on a 21-day cycle.  Plan to do 4 cycles and then reimage.  Patient will require some sort of maintenance treatment more than just single agent Keytruda.  Possibly Keytruda and low-dose Taxol or Keytruda plus lenvatinib.  Proceed with cycle 3, day 1 of treatment today.  Return to clinic in 1 week for further evaluation and consideration of cycle 3, day 8 which is carboplatinum and Taxol only.      2.  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  PET scan results from November 12, 2019 reviewed independently with no obvious evidence of recurrent or progressive disease.  Level 2 lymph node is no longer hypermetabolic.  3.  Renal insufficiency: Chronic and unchanged.  Patient's creatinine is 1.28. 4.  Anemia: Improved.  Patient's hemoglobin is 10.9 today. 5.  Hypokalemia: Resolved. 6.  Pulmonary embolism: Diagnosed in September 2022.  Continue Eliquis as prescribed.  Patient will require a minimum of 6 months of treatment. 7.  Leukopenia: Resolved. 8.  Peripheral neuropathy: Chronic and unchanged.  Possibly related to Taxol.  Monitor closely now but Taxol is being reinitiated. 9.  Trach: Continue follow-up with ENT as indicated.  Patient reports Scott Harding has an appointment in the next 1 to 2 months. 10.  Rash: Resolved with steroid taper.  Patient expressed understanding and was in agreement with this plan. Scott Harding also understands that Scott Harding can call clinic at any time with any questions, concerns, or complaints.    Cancer Staging  Malignant neoplasm of supraglottis [facility]) Staging form: Larynx - Supraglottis, AJCC 8th Edition - Clinical stage from 12/11/2020:  Stage IVC (cT3, cN2b, cM1) - Signed by Lloyd Huger, MD on 01/26/2021  Primary squamous cell carcinoma of tonsil ([place]) Staging form: Pharynx - HPV-Mediated Oropharynx, AJCC 8th Edition - Clinical: No stage assigned - Unsigned   Lloyd Huger, MD   10/07/2021 12:39 PM

## 2021-10-06 MED FILL — Fosaprepitant Dimeglumine For IV Infusion 150 MG (Base Eq): INTRAVENOUS | Qty: 5 | Status: AC

## 2021-10-06 MED FILL — Dexamethasone Sodium Phosphate Inj 100 MG/10ML: INTRAMUSCULAR | Qty: 1 | Status: AC

## 2021-10-07 ENCOUNTER — Inpatient Hospital Stay: Payer: Medicare Other

## 2021-10-07 ENCOUNTER — Inpatient Hospital Stay (HOSPITAL_BASED_OUTPATIENT_CLINIC_OR_DEPARTMENT_OTHER): Payer: Medicare Other | Admitting: Oncology

## 2021-10-07 ENCOUNTER — Encounter: Payer: Self-pay | Admitting: Oncology

## 2021-10-07 VITALS — BP 139/76 | HR 73 | Temp 98.7°F | Resp 20 | Wt 209.3 lb

## 2021-10-07 DIAGNOSIS — C321 Malignant neoplasm of supraglottis: Secondary | ICD-10-CM

## 2021-10-07 DIAGNOSIS — Z5112 Encounter for antineoplastic immunotherapy: Secondary | ICD-10-CM | POA: Diagnosis not present

## 2021-10-07 LAB — CBC WITH DIFFERENTIAL/PLATELET
Abs Immature Granulocytes: 0.07 10*3/uL (ref 0.00–0.07)
Basophils Absolute: 0 10*3/uL (ref 0.0–0.1)
Basophils Relative: 0 %
Eosinophils Absolute: 0.1 10*3/uL (ref 0.0–0.5)
Eosinophils Relative: 1 %
HCT: 33 % — ABNORMAL LOW (ref 39.0–52.0)
Hemoglobin: 10.9 g/dL — ABNORMAL LOW (ref 13.0–17.0)
Immature Granulocytes: 2 %
Lymphocytes Relative: 20 %
Lymphs Abs: 1 10*3/uL (ref 0.7–4.0)
MCH: 32.1 pg (ref 26.0–34.0)
MCHC: 33 g/dL (ref 30.0–36.0)
MCV: 97.1 fL (ref 80.0–100.0)
Monocytes Absolute: 0.8 10*3/uL (ref 0.1–1.0)
Monocytes Relative: 17 %
Neutro Abs: 2.9 10*3/uL (ref 1.7–7.7)
Neutrophils Relative %: 60 %
Platelets: 154 10*3/uL (ref 150–400)
RBC: 3.4 MIL/uL — ABNORMAL LOW (ref 4.22–5.81)
RDW: 17.7 % — ABNORMAL HIGH (ref 11.5–15.5)
WBC: 4.8 10*3/uL (ref 4.0–10.5)
nRBC: 0 % (ref 0.0–0.2)

## 2021-10-07 LAB — COMPREHENSIVE METABOLIC PANEL
ALT: 17 U/L (ref 0–44)
AST: 15 U/L (ref 15–41)
Albumin: 3.5 g/dL (ref 3.5–5.0)
Alkaline Phosphatase: 38 U/L (ref 38–126)
Anion gap: 8 (ref 5–15)
BUN: 20 mg/dL (ref 8–23)
CO2: 25 mmol/L (ref 22–32)
Calcium: 8.7 mg/dL — ABNORMAL LOW (ref 8.9–10.3)
Chloride: 105 mmol/L (ref 98–111)
Creatinine, Ser: 1.28 mg/dL — ABNORMAL HIGH (ref 0.61–1.24)
GFR, Estimated: >60 mL/min (ref >60)
Glucose, Bld: 106 mg/dL — ABNORMAL HIGH (ref 70–99)
Potassium: 3.6 mmol/L (ref 3.5–5.1)
Sodium: 138 mmol/L (ref 135–145)
Total Bilirubin: 0.5 mg/dL (ref 0.3–1.2)
Total Protein: 6.8 g/dL (ref 6.5–8.1)

## 2021-10-07 LAB — TSH: TSH: 2.026 u[IU]/mL (ref 0.350–4.500)

## 2021-10-07 MED ORDER — FAMOTIDINE IN NACL 20-0.9 MG/50ML-% IV SOLN
20.0000 mg | Freq: Once | INTRAVENOUS | Status: AC
  Administered 2021-10-07: 20 mg via INTRAVENOUS
  Filled 2021-10-07: qty 50

## 2021-10-07 MED ORDER — SODIUM CHLORIDE 0.9 % IV SOLN
200.0000 mg | Freq: Once | INTRAVENOUS | Status: AC
  Administered 2021-10-07: 200 mg via INTRAVENOUS
  Filled 2021-10-07: qty 200

## 2021-10-07 MED ORDER — SODIUM CHLORIDE 0.9 % IV SOLN
10.0000 mg | Freq: Once | INTRAVENOUS | Status: AC
  Administered 2021-10-07: 10 mg via INTRAVENOUS
  Filled 2021-10-07: qty 10

## 2021-10-07 MED ORDER — SODIUM CHLORIDE 0.9 % IV SOLN
201.6000 mg | Freq: Once | INTRAVENOUS | Status: AC
  Administered 2021-10-07: 200 mg via INTRAVENOUS
  Filled 2021-10-07: qty 20

## 2021-10-07 MED ORDER — DIPHENHYDRAMINE HCL 50 MG/ML IJ SOLN
25.0000 mg | Freq: Once | INTRAMUSCULAR | Status: AC
  Administered 2021-10-07: 25 mg via INTRAVENOUS
  Filled 2021-10-07: qty 1

## 2021-10-07 MED ORDER — SODIUM CHLORIDE 0.9 % IV SOLN
80.0000 mg/m2 | Freq: Once | INTRAVENOUS | Status: AC
  Administered 2021-10-07: 168 mg via INTRAVENOUS
  Filled 2021-10-07: qty 28

## 2021-10-07 MED ORDER — PALONOSETRON HCL INJECTION 0.25 MG/5ML
0.2500 mg | Freq: Once | INTRAVENOUS | Status: AC
  Administered 2021-10-07: 0.25 mg via INTRAVENOUS
  Filled 2021-10-07: qty 5

## 2021-10-07 MED ORDER — SODIUM CHLORIDE 0.9 % IV SOLN
Freq: Once | INTRAVENOUS | Status: AC
  Filled 2021-10-07: qty 250

## 2021-10-07 MED ORDER — HEPARIN SOD (PORK) LOCK FLUSH 100 UNIT/ML IV SOLN
500.0000 [IU] | Freq: Once | INTRAVENOUS | Status: AC | PRN
  Administered 2021-10-07: 500 [IU]
  Filled 2021-10-07: qty 5

## 2021-10-07 MED ORDER — SODIUM CHLORIDE 0.9 % IV SOLN
150.0000 mg | Freq: Once | INTRAVENOUS | Status: AC
  Administered 2021-10-07: 150 mg via INTRAVENOUS
  Filled 2021-10-07: qty 150

## 2021-10-07 NOTE — Patient Instructions (Signed)
[facility]  Discharge Instructions: Thank you for choosing [facility] to provide your oncology and hematology care.  If you have a lab appointment with the Cancer Center, please go directly to the Cancer Center and check in at the registration area.  Wear comfortable clothing and clothing appropriate for easy access to any Portacath or PICC line.   We strive to give you quality time with your provider. You may need to reschedule your appointment if you arrive late (15 or more minutes).  Arriving late affects you and other patients whose appointments are after yours.  Also, if you miss three or more appointments without notifying the office, you may be dismissed from the clinic at the provider's discretion.      For prescription refill requests, have your pharmacy contact our office and allow 72 hours for refills to be completed.    Today you received the following chemotherapy and/or immunotherapy agents- Keytruda, Taxol, Carboplatin      To help prevent nausea and vomiting after your treatment, we encourage you to take your nausea medication as directed.  BELOW ARE SYMPTOMS THAT SHOULD BE REPORTED IMMEDIATELY: *FEVER GREATER THAN 100.4 F (38 C) OR HIGHER *CHILLS OR SWEATING *NAUSEA AND VOMITING THAT IS NOT CONTROLLED WITH YOUR NAUSEA MEDICATION *UNUSUAL SHORTNESS OF BREATH *UNUSUAL BRUISING OR BLEEDING *URINARY PROBLEMS (pain or burning when urinating, or frequent urination) *BOWEL PROBLEMS (unusual diarrhea, constipation, pain near the anus) TENDERNESS IN MOUTH AND THROAT WITH OR WITHOUT PRESENCE OF ULCERS (sore throat, sores in mouth, or a toothache) UNUSUAL RASH, SWELLING OR PAIN  UNUSUAL VAGINAL DISCHARGE OR ITCHING   Items with * indicate a potential emergency and should be followed up as soon as possible or go to the Emergency Department if any problems should occur.  Please show the CHEMOTHERAPY ALERT CARD or IMMUNOTHERAPY ALERT  CARD at check-in to the Emergency Department and triage nurse.  Should you have questions after your visit or need to cancel or reschedule your appointment, please contact [facility]  [phone] and follow the prompts.  Office hours are 8:00 a.m. to 4:30 p.m. Monday - Friday. Please note that voicemails left after 4:00 p.m. may not be returned until the following business day.  We are closed weekends and major holidays. You have access to a nurse at all times for urgent questions. Please call the main number to the clinic [phone] and follow the prompts.  For any non-urgent questions, you may also contact your provider using MyChart. We now offer e-Visits for anyone 18 and older to request care online for non-urgent symptoms. For details visit [email or web address].   Also download the MyChart app! Go to the app store, search "MyChart", open the app, select [facility], and log in with your MyChart username and password.  Masks are optional in the cancer centers. If you would like for your care team to wear a mask while they are taking care of you, please let them know. For doctor visits, patients may have with them one support person who is at least 66 years old. At this time, visitors are not allowed in the infusion area.   

## 2021-10-08 LAB — T4: T4, Total: 6.7 ug/dL (ref 4.5–12.0)

## 2021-10-09 ENCOUNTER — Other Ambulatory Visit (HOSPITAL_COMMUNITY): Payer: Self-pay

## 2021-10-09 NOTE — Progress Notes (Signed)
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  INTERVAL HISTORY: Patient returns to clinic today for further evaluation and consideration of cycle 3, day 8 of carboplatinum, Taxol, and Keytruda.  Carboplatinum and Taxol only.  He continues to tolerate his treatments well without significant side effects.  He currently feels well and is asymptomatic.  He has a chronic peripheral neuropathy that is unchanged.  He has no other neurologic complaints.  He denies any pain today.  He denies any recent fevers or illnesses. He has no chest pain, shortness of breath, cough, or hemoptysis.  He denies any nausea, vomiting, constipation, or diarrhea.  He has no urinary complaints.  Patient offers no further specific complaints today.  REVIEW OF SYSTEMS:   Review of Systems  Constitutional: Negative.  Negative for fever, malaise/fatigue and weight loss.  Respiratory: Negative.  Negative for cough, hemoptysis and shortness of breath.   Cardiovascular: Negative.  Negative for chest pain and leg swelling.  Gastrointestinal: Negative.  Negative for abdominal pain.  Genitourinary: Negative.  Negative for dysuria.  Musculoskeletal: Negative.  Negative for neck pain.  Skin: Negative.  Negative for itching and rash.  Neurological:  Positive for tingling. Negative for dizziness, focal weakness, weakness and headaches.  Psychiatric/Behavioral: Negative.  The patient is not nervous/anxious.     As per HPI. Otherwise, a complete review of systems is negative.  PAST MEDICAL  HISTORY: Past Medical History:  Diagnosis Date   Cancer [facility])    Coronary artery disease    Hypertension     PAST SURGICAL HISTORY: Past Surgical History:  Procedure Laterality Date   CORONARY ARTERY BYPASS GRAFT  2010   Quadruple   DIRECT LARYNGOSCOPY  11/28/2020   Procedure: DIRECT LARYNGOSCOPY WITH BIOPSY;  Surgeon: Beverly Gust, MD;  Location: [facility] ORS;  Service: ENT;;   IR IMAGING GUIDED PORT INSERTION  01/01/2021   PULMONARY THROMBECTOMY Bilateral 01/13/2021   Procedure: PULMONARY THROMBECTOMY;  Surgeon: Katha Cabal, MD;  Location: [facility];  Service: Cardiovascular;  Laterality: Bilateral;   TRACHEOSTOMY TUBE PLACEMENT N/A 11/28/2020   Procedure: AWAKE TRACHEOSTOMY;  Surgeon: Beverly Gust, MD;  Location: [facility] OR[facility];  Service: ENT;  Laterality: N/A;    FAMILY HISTORY: Family History  Problem Relation Age of Onset   Arthritis Mother    Heart attack Father    Brain cancer Sister     ADVANCED DIRECTIVES (Y/N):  N  HEALTH MAINTENANCE: Social History   Tobacco Use   Smoking status: Former    Types: Cigarettes    Quit date: 10/17/2008    Years since quitting: 13.0   Smokeless tobacco: Never  Vaping Use   Vaping Use: Never used  Substance Use Topics   Alcohol use: Yes    Comment: occasionally   Drug use: Never     Colonoscopy:  PAP:  Bone density:  Lipid panel:  No Known Allergies  Current Outpatient Medications  Medication Sig Dispense Refill   amLODipine (NORVASC) 10 MG tablet Take 10 mg by mouth daily.     apixaban (ELIQUIS) 5 MG TABS tablet Take 1 tablet (5 mg total) by mouth 2 (two) times daily.  60 tablet 3   ascorbic acid (VITAMIN C) 500 MG tablet Take by mouth.     aspirin 81 MG chewable tablet Chew by mouth.     cyanocobalamin 1000 MCG tablet Take by mouth.     DENTA 5000 PLUS 1.1 % CREA dental cream      gentamicin ointment (GARAMYCIN) 0.1 % Apply topically.     hydrALAZINE (APRESOLINE) 50 MG tablet Take 50 mg by mouth in the  morning and at bedtime. 1.5 tab BID     metoprolol succinate (TOPROL-XL) 25 MG 24 hr tablet Take 25 mg by mouth daily.     olmesartan (BENICAR) 40 MG tablet Take 40 mg by mouth daily.     rosuvastatin (CRESTOR) 40 MG tablet Take 40 mg by mouth daily.     ALPRAZolam (XANAX) 0.5 MG tablet Take 1 tablet (0.5 mg total) by mouth daily as needed for anxiety (Take 1 hour prior to radiation). (Patient not taking: Reported on 04/14/2021) 30 tablet 1   betamethasone valerate ointment (VALISONE) 0.1 % Apply 1 application. topically 2 (two) times daily. For up to 2 weeks (Patient not taking: Reported on 09/23/2021) 30 g 0   fluticasone (FLONASE) 50 MCG/ACT nasal spray Place 1 spray into both nostrils daily. (Patient not taking: Reported on 10/14/2021) 15.8 mL 0   ipratropium-albuterol (DUONEB) 0.5-2.5 (3) MG/3ML SOLN Take 3 mLs by nebulization every 6 (six) hours. (Patient not taking: Reported on 03/24/2021) 360 mL 0   ondansetron (ZOFRAN) 8 MG tablet Take 1 tablet (8 mg total) by mouth every 8 (eight) hours as needed for nausea or vomiting. (Patient not taking: Reported on 04/14/2021) 60 tablet 2   predniSONE (DELTASONE) 10 MG tablet Take 6 tablets (60 mg total) by mouth daily with breakfast. Then start taper pack. (Patient not taking: Reported on 09/23/2021) 42 tablet 0   predniSONE (STERAPRED UNI-PAK 21 TAB) 10 MG (21) TBPK tablet 6 tablets (60 mg total) by mouth first day, 5 tablets second day, 4 tablets third day, 3 tablets fourth day, 2 fifth day and 1 tablet last day for a total of 21 tablets (Patient not taking: Reported on 10/07/2021) 21 tablet 0   prochlorperazine (COMPAZINE) 10 MG tablet Take 1 tablet (10 mg total) by mouth every 6 (six) hours as needed for nausea or vomiting. (Patient not taking: Reported on 09/23/2021) 60 tablet 2   No current facility-administered medications for this visit.   Facility-Administered Medications Ordered in Other Visits  Medication Dose Route Frequency Provider Last Rate Last  Admin   heparin lock flush 100 UNIT/ML injection            heparin lock flush 100 unit/mL  500 Units Intravenous Once Lloyd Huger, MD       sodium chloride flush (NS) 0.9 % injection 10 mL  10 mL Intravenous PRN Lloyd Huger, MD   10 mL at 01/06/21 1305    OBJECTIVE: Vitals:   10/14/21 0959  BP: 134/85  Pulse: 70  Resp: 16  Temp: (!) 97.4 F (36.3 C)  SpO2: 99%      Body mass index is 30.72 kg/m.    ECOG FS:0 - Asymptomatic  General: Well-developed, well-nourished, no acute distress. Eyes: Pink conjunctiva, anicteric sclera. HEENT: Normocephalic, moist mucous membranes.  Trach noted, lymphadenopathy significantly reduced. Lungs: No audible wheezing or coughing. Heart: Regular rate and rhythm. Abdomen: Soft, nontender, no obvious distention. Musculoskeletal: No edema, cyanosis, or clubbing. Neuro: Alert, answering all questions appropriately. Cranial nerves grossly intact.  Skin: No rashes or petechiae noted. Psych: Normal affect.   LAB RESULTS:  Lab Results  Component Value Date   NA 135 10/14/2021   K 3.7 10/14/2021   CL 104 10/14/2021   CO2 25 10/14/2021   GLUCOSE 105 (H) 10/14/2021   BUN 18 10/14/2021   CREATININE 1.32 (H) 10/14/2021   CALCIUM 8.2 (L) 10/14/2021   PROT 6.8 10/14/2021   ALBUMIN 3.7 10/14/2021   AST 18 10/14/2021   ALT 17 10/14/2021   ALKPHOS 40 10/14/2021   BILITOT 0.6 10/14/2021   GFRNONAA 60 (L) 10/14/2021   GFRAA 37 (L) 11/13/2019    Lab Results  Component Value Date   WBC 4.1 10/14/2021   NEUTROABS 2.9 10/14/2021   HGB 10.5 (L) 10/14/2021   HCT 32.3 (L) 10/14/2021   MCV 97.3 10/14/2021   PLT 189 10/14/2021     STUDIES: No results found.  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  PLAN:    1.  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  Case discussed with [facility] head and neck physician, Dr. Ernst Bowler, and consensus was to restart chemotherapy with carboplatinum, Taxol, and Keytruda on a 21-day cycle.  Plan to do 4 cycles and then reimage.  Patient will require some sort of maintenance treatment more than just single agent Keytruda.  Possibly Keytruda and low-dose Taxol or Keytruda plus lenvatinib.  Proceed with cycle 3, day 8 of treatment today.  Return to clinic in 2 weeks for further evaluation and consideration of cycle 4, day 1.  Patient reports he has evaluation by [facility], Dr. Ihor Austin on November 04, 2021.      2.  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  PET scan results from November 12, 2019 reviewed independently with no obvious evidence of recurrent or progressive disease.  Level 2 lymph node is no longer hypermetabolic.  3.  Renal insufficiency: Chronic and unchanged.  Patient creatinine is 1.32 today. 4.  Anemia: Chronic and unchanged.  Patient's hemoglobin is 10.5 today.   5.  Hypokalemia: Resolved. 6.  Pulmonary embolism: Diagnosed in September 2022.  Continue Eliquis as prescribed.  Patient will require a minimum of 6 months of treatment. 7.  Leukopenia: Resolved. 8.  Peripheral neuropathy: Chronic and unchanged.  Possibly related to Taxol.  Monitor closely now but Taxol is being reinitiated. 9.  Trach: Follow-up with ENT as above.   10.  Rash: Resolved with steroid taper.  Patient expressed understanding and was in agreement with this plan. He also understands that He can call clinic at any time with any questions, concerns, or complaints.    Cancer Staging  Malignant neoplasm of supraglottis [facility]) Staging form: Larynx - Supraglottis, AJCC 8th Edition - Clinical stage from 12/11/2020: Stage IVC (cT3, cN2b, cM1) - Signed by Lloyd Huger, MD on 01/26/2021  Primary squamous cell carcinoma of tonsil  ([place]) Staging form: Pharynx - HPV-Mediated Oropharynx, AJCC 8th Edition - Clinical: No stage assigned - Unsigned   Lloyd Huger, MD   10/14/2021 2:56 PM

## 2021-10-13 ENCOUNTER — Other Ambulatory Visit: Payer: Self-pay | Admitting: *Deleted

## 2021-10-13 DIAGNOSIS — C321 Malignant neoplasm of supraglottis: Secondary | ICD-10-CM

## 2021-10-13 DIAGNOSIS — C099 Malignant neoplasm of tonsil, unspecified: Secondary | ICD-10-CM

## 2021-10-13 MED FILL — Fosaprepitant Dimeglumine For IV Infusion 150 MG (Base Eq): INTRAVENOUS | Qty: 5 | Status: AC

## 2021-10-13 MED FILL — Dexamethasone Sodium Phosphate Inj 100 MG/10ML: INTRAMUSCULAR | Qty: 1 | Status: AC

## 2021-10-14 ENCOUNTER — Inpatient Hospital Stay: Payer: Medicare Other

## 2021-10-14 ENCOUNTER — Encounter: Payer: Self-pay | Admitting: Oncology

## 2021-10-14 ENCOUNTER — Inpatient Hospital Stay (HOSPITAL_BASED_OUTPATIENT_CLINIC_OR_DEPARTMENT_OTHER): Payer: Medicare Other | Admitting: Oncology

## 2021-10-14 VITALS — BP 134/85 | HR 70 | Temp 97.4°F | Resp 16 | Ht 69.0 in | Wt 208.0 lb

## 2021-10-14 DIAGNOSIS — Z5112 Encounter for antineoplastic immunotherapy: Secondary | ICD-10-CM | POA: Diagnosis not present

## 2021-10-14 DIAGNOSIS — C099 Malignant neoplasm of tonsil, unspecified: Secondary | ICD-10-CM | POA: Diagnosis not present

## 2021-10-14 DIAGNOSIS — C321 Malignant neoplasm of supraglottis: Secondary | ICD-10-CM

## 2021-10-14 LAB — COMPREHENSIVE METABOLIC PANEL
ALT: 17 U/L (ref 0–44)
AST: 18 U/L (ref 15–41)
Albumin: 3.7 g/dL (ref 3.5–5.0)
Alkaline Phosphatase: 40 U/L (ref 38–126)
Anion gap: 6 (ref 5–15)
BUN: 18 mg/dL (ref 8–23)
CO2: 25 mmol/L (ref 22–32)
Calcium: 8.2 mg/dL — ABNORMAL LOW (ref 8.9–10.3)
Chloride: 104 mmol/L (ref 98–111)
Creatinine, Ser: 1.32 mg/dL — ABNORMAL HIGH (ref 0.61–1.24)
GFR, Estimated: 60 mL/min — ABNORMAL LOW (ref >60)
Glucose, Bld: 105 mg/dL — ABNORMAL HIGH (ref 70–99)
Potassium: 3.7 mmol/L (ref 3.5–5.1)
Sodium: 135 mmol/L (ref 135–145)
Total Bilirubin: 0.6 mg/dL (ref 0.3–1.2)
Total Protein: 6.8 g/dL (ref 6.5–8.1)

## 2021-10-14 LAB — CBC WITH DIFFERENTIAL/PLATELET
Abs Immature Granulocytes: 0.08 10*3/uL — ABNORMAL HIGH (ref 0.00–0.07)
Basophils Absolute: 0 10*3/uL (ref 0.0–0.1)
Basophils Relative: 1 %
Eosinophils Absolute: 0.1 10*3/uL (ref 0.0–0.5)
Eosinophils Relative: 1 %
HCT: 32.3 % — ABNORMAL LOW (ref 39.0–52.0)
Hemoglobin: 10.5 g/dL — ABNORMAL LOW (ref 13.0–17.0)
Immature Granulocytes: 2 %
Lymphocytes Relative: 20 %
Lymphs Abs: 0.8 10*3/uL (ref 0.7–4.0)
MCH: 31.6 pg (ref 26.0–34.0)
MCHC: 32.5 g/dL (ref 30.0–36.0)
MCV: 97.3 fL (ref 80.0–100.0)
Monocytes Absolute: 0.3 10*3/uL (ref 0.1–1.0)
Monocytes Relative: 7 %
Neutro Abs: 2.9 10*3/uL (ref 1.7–7.7)
Neutrophils Relative %: 69 %
Platelets: 189 10*3/uL (ref 150–400)
RBC: 3.32 MIL/uL — ABNORMAL LOW (ref 4.22–5.81)
RDW: 16.6 % — ABNORMAL HIGH (ref 11.5–15.5)
WBC: 4.1 10*3/uL (ref 4.0–10.5)
nRBC: 0 % (ref 0.0–0.2)

## 2021-10-14 MED ORDER — SODIUM CHLORIDE 0.9 % IV SOLN
197.0000 mg | Freq: Once | INTRAVENOUS | Status: AC
  Administered 2021-10-14: 200 mg via INTRAVENOUS
  Filled 2021-10-14: qty 20

## 2021-10-14 MED ORDER — SODIUM CHLORIDE 0.9 % IV SOLN
150.0000 mg | Freq: Once | INTRAVENOUS | Status: AC
  Administered 2021-10-14: 150 mg via INTRAVENOUS
  Filled 2021-10-14: qty 150

## 2021-10-14 MED ORDER — SODIUM CHLORIDE 0.9 % IV SOLN
10.0000 mg | Freq: Once | INTRAVENOUS | Status: AC
  Administered 2021-10-14: 10 mg via INTRAVENOUS
  Filled 2021-10-14: qty 10

## 2021-10-14 MED ORDER — FAMOTIDINE IN NACL 20-0.9 MG/50ML-% IV SOLN
20.0000 mg | Freq: Once | INTRAVENOUS | Status: AC
  Administered 2021-10-14: 20 mg via INTRAVENOUS
  Filled 2021-10-14: qty 50

## 2021-10-14 MED ORDER — DIPHENHYDRAMINE HCL 50 MG/ML IJ SOLN
25.0000 mg | Freq: Once | INTRAMUSCULAR | Status: AC
  Administered 2021-10-14: 25 mg via INTRAVENOUS
  Filled 2021-10-14: qty 1

## 2021-10-14 MED ORDER — HEPARIN SOD (PORK) LOCK FLUSH 100 UNIT/ML IV SOLN
500.0000 [IU] | Freq: Once | INTRAVENOUS | Status: AC | PRN
  Administered 2021-10-14: 500 [IU]
  Filled 2021-10-14: qty 5

## 2021-10-14 MED ORDER — PALONOSETRON HCL INJECTION 0.25 MG/5ML
0.2500 mg | Freq: Once | INTRAVENOUS | Status: AC
  Administered 2021-10-14: 0.25 mg via INTRAVENOUS
  Filled 2021-10-14: qty 5

## 2021-10-14 MED ORDER — SODIUM CHLORIDE 0.9 % IV SOLN
Freq: Once | INTRAVENOUS | Status: AC
  Filled 2021-10-14: qty 250

## 2021-10-14 MED ORDER — SODIUM CHLORIDE 0.9 % IV SOLN
80.0000 mg/m2 | Freq: Once | INTRAVENOUS | Status: AC
  Administered 2021-10-14: 168 mg via INTRAVENOUS
  Filled 2021-10-14: qty 28

## 2021-10-14 NOTE — Patient Instructions (Signed)
[facility] [place]  Discharge Instructions: Thank you for choosing [facility] to provide your oncology and hematology care.  If you have a lab appointment with the [facility], please go directly to the [facility] and check in at the registration area.  Wear comfortable clothing and clothing appropriate for easy access to any Portacath or PICC line.   We strive to give you quality time with your provider. You may need to reschedule your appointment if you arrive late (15 or more minutes).  Arriving late affects you and other patients whose appointments are after yours.  Also, if you miss three or more appointments without notifying the office, you may be dismissed from the clinic at the provider's discretion.      For prescription refill requests, have your pharmacy contact our office and allow 72 hours for refills to be completed.    Today you received the following chemotherapy and/or immunotherapy agents TAXOL and CARBOPLATIN       To help prevent nausea and vomiting after your treatment, we encourage you to take your nausea medication as directed.  BELOW ARE SYMPTOMS THAT SHOULD BE REPORTED IMMEDIATELY: *FEVER GREATER THAN 100.4 F (38 C) OR HIGHER *CHILLS OR SWEATING *NAUSEA AND VOMITING THAT IS NOT CONTROLLED WITH YOUR NAUSEA MEDICATION *UNUSUAL SHORTNESS OF BREATH *UNUSUAL BRUISING OR BLEEDING *URINARY PROBLEMS (pain or burning when urinating, or frequent urination) *BOWEL PROBLEMS (unusual diarrhea, constipation, pain near the anus) TENDERNESS IN MOUTH AND THROAT WITH OR WITHOUT PRESENCE OF ULCERS (sore throat, sores in mouth, or a toothache) UNUSUAL RASH, SWELLING OR PAIN  UNUSUAL VAGINAL DISCHARGE OR ITCHING   Items with * indicate a potential emergency and should be followed up as soon as possible or go to the Emergency Department if any problems should occur.  Please show the CHEMOTHERAPY ALERT CARD or IMMUNOTHERAPY ALERT CARD at  check-in to the Emergency Department and triage nurse.  Should you have questions after your visit or need to cancel or reschedule your appointment, please contact [facility] [place]  [phone] and follow the prompts.  Office hours are 8:00 a.m. to 4:30 p.m. Monday - Friday. Please note that voicemails left after 4:00 p.m. may not be returned until the following business day.  We are closed weekends and major holidays. You have access to a nurse at all times for urgent questions. Please call the main number to the clinic [phone] and follow the prompts.  For any non-urgent questions, you may also contact your provider using MyChart. We now offer e-Visits for anyone 66 and older to request care online for non-urgent symptoms. For details visit [email or web address].   Also download the MyChart app! Go to the app store, search "MyChart", open the app, select [facility], and log in with your MyChart username and password.  Masks are optional in the cancer centers. If you would like for your care team to wear a mask while they are taking care of you, please let them know. For doctor visits, patients may have with them one support person who is at least 66 years old. At this time, visitors are not allowed in the infusion area.  Paclitaxel injection What is this medication? PACLITAXEL (PAK li TAX el) is a chemotherapy drug. It targets fast dividing cells, like cancer cells, and causes these cells to die. This medicine is used to treat ovarian cancer, breast cancer, lung cancer, Kaposi's sarcoma, and other cancers. This medicine may be used for other  purposes; ask your health care provider or pharmacist if you have questions. COMMON BRAND NAME(S): Onxol, Taxol What should I tell my care team before I take this medication? They need to know if you have any of these conditions: history of irregular heartbeat liver disease low blood counts, like low white cell, platelet,  or red cell counts lung or breathing disease, like asthma tingling of the fingers or toes, or other nerve disorder an unusual or allergic reaction to paclitaxel, alcohol, polyoxyethylated castor oil, other chemotherapy, other medicines, foods, dyes, or preservatives pregnant or trying to get pregnant breast-feeding How should I use this medication? This drug is given as an infusion into a vein. It is administered in a hospital or clinic by a specially trained health care professional. Talk to your pediatrician regarding the use of this medicine in children. Special care may be needed. Overdosage: If you think you have taken too much of this medicine contact a poison control center or emergency room at once. NOTE: This medicine is only for you. Do not share this medicine with others. What if I miss a dose? It is important not to miss your dose. Call your doctor or health care professional if you are unable to keep an appointment. What may interact with this medication? Do not take this medicine with any of the following medications: live virus vaccines This medicine may also interact with the following medications: antiviral medicines for hepatitis, HIV or AIDS certain antibiotics like erythromycin and clarithromycin certain medicines for fungal infections like ketoconazole and itraconazole certain medicines for seizures like carbamazepine, phenobarbital, phenytoin gemfibrozil nefazodone rifampin St. John's wort This list may not describe all possible interactions. Give your health care provider a list of all the medicines, herbs, non-prescription drugs, or dietary supplements you use. Also tell them if you smoke, drink alcohol, or use illegal drugs. Some items may interact with your medicine. What should I watch for while using this medication? Your condition will be monitored carefully while you are receiving this medicine. You will need important blood work done while you are taking this  medicine. This medicine can cause serious allergic reactions. To reduce your risk you will need to take other medicine(s) before treatment with this medicine. If you experience allergic reactions like skin rash, itching or hives, swelling of the face, lips, or tongue, tell your doctor or health care professional right away. In some cases, you may be given additional medicines to help with side effects. Follow all directions for their use. This drug may make you feel generally unwell. This is not uncommon, as chemotherapy can affect healthy cells as well as cancer cells. Report any side effects. Continue your course of treatment even though you feel ill unless your doctor tells you to stop. Call your doctor or health care professional for advice if you get a fever, chills or sore throat, or other symptoms of a cold or flu. Do not treat yourself. This drug decreases your body's ability to fight infections. Try to avoid being around people who are sick. This medicine may increase your risk to bruise or bleed. Call your doctor or health care professional if you notice any unusual bleeding. Be careful brushing and flossing your teeth or using a toothpick because you may get an infection or bleed more easily. If you have any dental work done, tell your dentist you are receiving this medicine. Avoid taking products that contain aspirin, acetaminophen, ibuprofen, naproxen, or ketoprofen unless instructed by your doctor. These medicines may  hide a fever. Do not become pregnant while taking this medicine. Women should inform their doctor if they wish to become pregnant or think they might be pregnant. There is a potential for serious side effects to an unborn child. Talk to your health care professional or pharmacist for more information. Do not breast-feed an infant while taking this medicine. Men are advised not to father a child while receiving this medicine. This product may contain alcohol. Ask your pharmacist  or healthcare provider if this medicine contains alcohol. Be sure to tell all healthcare providers you are taking this medicine. Certain medicines, like metronidazole and disulfiram, can cause an unpleasant reaction when taken with alcohol. The reaction includes flushing, headache, nausea, vomiting, sweating, and increased thirst. The reaction can last from 30 minutes to several hours. What side effects may I notice from receiving this medication? Side effects that you should report to your doctor or health care professional as soon as possible: allergic reactions like skin rash, itching or hives, swelling of the face, lips, or tongue breathing problems changes in vision fast, irregular heartbeat high or low blood pressure mouth sores pain, tingling, numbness in the hands or feet signs of decreased platelets or bleeding - bruising, pinpoint red spots on the skin, black, tarry stools, blood in the urine signs of decreased red blood cells - unusually weak or tired, feeling faint or lightheaded, falls signs of infection - fever or chills, cough, sore throat, pain or difficulty passing urine signs and symptoms of liver injury like dark yellow or brown urine; general ill feeling or flu-like symptoms; light-colored stools; loss of appetite; nausea; right upper belly pain; unusually weak or tired; yellowing of the eyes or skin swelling of the ankles, feet, hands unusually slow heartbeat Side effects that usually do not require medical attention (report to your doctor or health care professional if they continue or are bothersome): diarrhea hair loss loss of appetite muscle or joint pain nausea, vomiting pain, redness, or irritation at site where injected tiredness This list may not describe all possible side effects. Call your doctor for medical advice about side effects. You may report side effects to FDA at 1-800-FDA-1088. Where should I keep my medication? This drug is given in a hospital or  clinic and will not be stored at home. NOTE: This sheet is a summary. It may not cover all possible information. If you have questions about this medicine, talk to your doctor, pharmacist, or health care provider.  2023 Elsevier/Gold Standard (2021-03-06 00:00:00)  Carboplatin injection What is this medication? CARBOPLATIN (KAR boe pla tin) is a chemotherapy drug. It targets fast dividing cells, like cancer cells, and causes these cells to die. This medicine is used to treat ovarian cancer and many other cancers. This medicine may be used for other purposes; ask your health care provider or pharmacist if you have questions. COMMON BRAND NAME(S): Paraplatin What should I tell my care team before I take this medication? They need to know if you have any of these conditions: blood disorders hearing problems kidney disease recent or ongoing radiation therapy an unusual or allergic reaction to carboplatin, cisplatin, other chemotherapy, other medicines, foods, dyes, or preservatives pregnant or trying to get pregnant breast-feeding How should I use this medication? This drug is usually given as an infusion into a vein. It is administered in a hospital or clinic by a specially trained health care professional. Talk to your pediatrician regarding the use of this medicine in children. Special care may be  needed. Overdosage: If you think you have taken too much of this medicine contact a poison control center or emergency room at once. NOTE: This medicine is only for you. Do not share this medicine with others. What if I miss a dose? It is important not to miss a dose. Call your doctor or health care professional if you are unable to keep an appointment. What may interact with this medication? medicines for seizures medicines to increase blood counts like filgrastim, pegfilgrastim, sargramostim some antibiotics like amikacin, gentamicin, neomycin, streptomycin, tobramycin vaccines Talk to your  doctor or health care professional before taking any of these medicines: acetaminophen aspirin ibuprofen ketoprofen naproxen This list may not describe all possible interactions. Give your health care provider a list of all the medicines, herbs, non-prescription drugs, or dietary supplements you use. Also tell them if you smoke, drink alcohol, or use illegal drugs. Some items may interact with your medicine. What should I watch for while using this medication? Your condition will be monitored carefully while you are receiving this medicine. You will need important blood work done while you are taking this medicine. This drug may make you feel generally unwell. This is not uncommon, as chemotherapy can affect healthy cells as well as cancer cells. Report any side effects. Continue your course of treatment even though you feel ill unless your doctor tells you to stop. In some cases, you may be given additional medicines to help with side effects. Follow all directions for their use. Call your doctor or health care professional for advice if you get a fever, chills or sore throat, or other symptoms of a cold or flu. Do not treat yourself. This drug decreases your body's ability to fight infections. Try to avoid being around people who are sick. This medicine may increase your risk to bruise or bleed. Call your doctor or health care professional if you notice any unusual bleeding. Be careful brushing and flossing your teeth or using a toothpick because you may get an infection or bleed more easily. If you have any dental work done, tell your dentist you are receiving this medicine. Avoid taking products that contain aspirin, acetaminophen, ibuprofen, naproxen, or ketoprofen unless instructed by your doctor. These medicines may hide a fever. Do not become pregnant while taking this medicine. Women should inform their doctor if they wish to become pregnant or think they might be pregnant. There is a  potential for serious side effects to an unborn child. Talk to your health care professional or pharmacist for more information. Do not breast-feed an infant while taking this medicine. What side effects may I notice from receiving this medication? Side effects that you should report to your doctor or health care professional as soon as possible: allergic reactions like skin rash, itching or hives, swelling of the face, lips, or tongue signs of infection - fever or chills, cough, sore throat, pain or difficulty passing urine signs of decreased platelets or bleeding - bruising, pinpoint red spots on the skin, black, tarry stools, nosebleeds signs of decreased red blood cells - unusually weak or tired, fainting spells, lightheadedness breathing problems changes in hearing changes in vision chest pain high blood pressure low blood counts - This drug may decrease the number of white blood cells, red blood cells and platelets. You may be at increased risk for infections and bleeding. nausea and vomiting pain, swelling, redness or irritation at the injection site pain, tingling, numbness in the hands or feet problems with balance, talking, walking  trouble passing urine or change in the amount of urine Side effects that usually do not require medical attention (report to your doctor or health care professional if they continue or are bothersome): hair loss loss of appetite metallic taste in the mouth or changes in taste This list may not describe all possible side effects. Call your doctor for medical advice about side effects. You may report side effects to FDA at 1-800-FDA-1088. Where should I keep my medication? This drug is given in a hospital or clinic and will not be stored at home. NOTE: This sheet is a summary. It may not cover all possible information. If you have questions about this medicine, talk to your doctor, pharmacist, or health care provider.  2023 Elsevier/Gold Standard  (2007-09-13 00:00:00)

## 2021-10-27 MED FILL — Fosaprepitant Dimeglumine For IV Infusion 150 MG (Base Eq): INTRAVENOUS | Qty: 5 | Status: AC

## 2021-10-27 MED FILL — Dexamethasone Sodium Phosphate Inj 100 MG/10ML: INTRAMUSCULAR | Qty: 1 | Status: AC

## 2021-10-27 NOTE — Progress Notes (Unsigned)
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  INTERVAL HISTORY: Patient returns to clinic today for further evaluation and consideration of cycle 4, day 1 of carboplatinum, Taxol, and Keytruda.  He continues to feel well.  He is tolerating his treatments without significant side effects. He has a chronic peripheral neuropathy that is unchanged.  He has no other neurologic complaints.  He denies any pain today.  He denies any recent fevers or illnesses. He has no chest pain, shortness of breath, cough, or hemoptysis.  He denies any nausea, vomiting, constipation, or diarrhea.  He has no urinary complaints.  Patient offers no further specific complaints today.  REVIEW OF SYSTEMS:   Review of Systems  Constitutional: Negative.  Negative for fever, malaise/fatigue and weight loss.  Respiratory: Negative.  Negative for cough, hemoptysis and shortness of breath.   Cardiovascular: Negative.  Negative for chest pain and leg swelling.  Gastrointestinal: Negative.  Negative for abdominal pain.  Genitourinary: Negative.  Negative for dysuria.  Musculoskeletal: Negative.  Negative for neck pain.  Skin: Negative.  Negative for itching and rash.  Neurological:  Positive for tingling. Negative for dizziness, focal weakness, weakness and headaches.  Psychiatric/Behavioral: Negative.  The patient is not nervous/anxious.     As per HPI. Otherwise, a complete review of systems is negative.  PAST MEDICAL HISTORY: Past Medical History:  Diagnosis Date   Cancer [facility])     Coronary artery disease    Hypertension     PAST SURGICAL HISTORY: Past Surgical History:  Procedure Laterality Date   CORONARY ARTERY BYPASS GRAFT  2010   Quadruple   DIRECT LARYNGOSCOPY  11/28/2020   Procedure: DIRECT LARYNGOSCOPY WITH BIOPSY;  Surgeon: Beverly Gust, MD;  Location: [facility] ORS;  Service: ENT;;   IR IMAGING GUIDED PORT INSERTION  01/01/2021   PULMONARY THROMBECTOMY Bilateral 01/13/2021   Procedure: PULMONARY THROMBECTOMY;  Surgeon: Katha Cabal, MD;  Location: [facility];  Service: Cardiovascular;  Laterality: Bilateral;   TRACHEOSTOMY TUBE PLACEMENT N/A 11/28/2020   Procedure: AWAKE TRACHEOSTOMY;  Surgeon: Beverly Gust, MD;  Location: [facility] ORS;  Service: ENT;  Laterality: N/A;    FAMILY HISTORY: Family History  Problem Relation Age of Onset   Arthritis Mother    Heart attack Father    Brain cancer Sister     ADVANCED DIRECTIVES (Y/N):  N  HEALTH MAINTENANCE: Social History   Tobacco Use   Smoking status: Former    Types: Cigarettes    Quit date: 10/17/2008    Years since quitting: 13.0   Smokeless tobacco: Never  Vaping Use   Vaping Use: Never used  Substance Use Topics   Alcohol use: Yes    Comment: occasionally   Drug use: Never     Colonoscopy:  PAP:  Bone density:  Lipid panel:  No Known Allergies  Current Outpatient Medications  Medication Sig Dispense Refill   amLODipine (NORVASC) 10 MG tablet Take 10 mg by mouth daily.     apixaban (ELIQUIS) 5 MG TABS tablet Take 1 tablet (5 mg total) by mouth 2 (two) times daily. 60 tablet 3   ascorbic acid (VITAMIN C) 500  MG tablet Take by mouth.     aspirin 81 MG chewable tablet Chew by mouth.     cyanocobalamin 1000 MCG tablet Take by mouth.     DENTA 5000 PLUS 1.1 % CREA dental cream      gentamicin ointment (GARAMYCIN) 0.1 % Apply topically.     hydrALAZINE (APRESOLINE) 50 MG tablet Take 50 mg by mouth in the morning and at bedtime. 1.5 tab BID     metoprolol succinate  (TOPROL-XL) 25 MG 24 hr tablet Take 25 mg by mouth daily.     olmesartan (BENICAR) 40 MG tablet Take 40 mg by mouth daily.     rosuvastatin (CRESTOR) 40 MG tablet Take 40 mg by mouth daily.     ALPRAZolam (XANAX) 0.5 MG tablet Take 1 tablet (0.5 mg total) by mouth daily as needed for anxiety (Take 1 hour prior to radiation). (Patient not taking: Reported on 04/14/2021) 30 tablet 1   betamethasone valerate ointment (VALISONE) 0.1 % Apply 1 application. topically 2 (two) times daily. For up to 2 weeks (Patient not taking: Reported on 09/23/2021) 30 g 0   fluticasone (FLONASE) 50 MCG/ACT nasal spray Place 1 spray into both nostrils daily. (Patient not taking: Reported on 10/14/2021) 15.8 mL 0   ipratropium-albuterol (DUONEB) 0.5-2.5 (3) MG/3ML SOLN Take 3 mLs by nebulization every 6 (six) hours. (Patient not taking: Reported on 03/24/2021) 360 mL 0   ondansetron (ZOFRAN) 8 MG tablet Take 1 tablet (8 mg total) by mouth every 8 (eight) hours as needed for nausea or vomiting. (Patient not taking: Reported on 04/14/2021) 60 tablet 2   predniSONE (DELTASONE) 10 MG tablet Take 6 tablets (60 mg total) by mouth daily with breakfast. Then start taper pack. (Patient not taking: Reported on 09/23/2021) 42 tablet 0   predniSONE (STERAPRED UNI-PAK 21 TAB) 10 MG (21) TBPK tablet 6 tablets (60 mg total) by mouth first day, 5 tablets second day, 4 tablets third day, 3 tablets fourth day, 2 fifth day and 1 tablet last day for a total of 21 tablets (Patient not taking: Reported on 10/07/2021) 21 tablet 0   prochlorperazine (COMPAZINE) 10 MG tablet Take 1 tablet (10 mg total) by mouth every 6 (six) hours as needed for nausea or vomiting. (Patient not taking: Reported on 09/23/2021) 60 tablet 2   No current facility-administered medications for this visit.   Facility-Administered Medications Ordered in Other Visits  Medication Dose Route Frequency Provider Last Rate Last Admin   heparin lock flush 100 UNIT/ML injection             heparin lock flush 100 unit/mL  500 Units Intravenous Once Lloyd Huger, MD       sodium chloride flush (NS) 0.9 % injection 10 mL  10 mL Intravenous PRN Lloyd Huger, MD   10 mL at 01/06/21 1305    OBJECTIVE: Vitals:   10/28/21 0926  BP: 118/71  Pulse: 81  Resp: 16  Temp: (!) 97.3 F (36.3 C)  SpO2: 100%      Body mass index is 31.01 kg/m.    ECOG FS:0 - Asymptomatic  General: Well-developed, well-nourished, no acute distress. Eyes: Pink conjunctiva, anicteric sclera. HEENT: Normocephalic, moist mucous membranes.  Trach in place. Lungs: No audible wheezing or coughing. Heart: Regular rate and rhythm. Abdomen: Soft, nontender, no obvious distention. Musculoskeletal: No edema, cyanosis, or clubbing. Neuro: Alert, answering all questions appropriately. Cranial nerves grossly intact. Skin: No rashes or petechiae noted. Psych: Normal affect.  LAB RESULTS:  Lab Results  Component Value Date   NA 137 10/28/2021   K 3.7 10/28/2021   CL 105 10/28/2021   CO2 25 10/28/2021   GLUCOSE 108 (H) 10/28/2021   BUN 19 10/28/2021   CREATININE 1.19 10/28/2021   CALCIUM 8.6 (L) 10/28/2021   PROT 6.8 10/28/2021   ALBUMIN 3.6 10/28/2021   AST 18 10/28/2021   ALT 18 10/28/2021   ALKPHOS 47 10/28/2021   BILITOT 0.6 10/28/2021   GFRNONAA >60 10/28/2021   GFRAA 37 (L) 11/13/2019    Lab Results  Component Value Date   WBC 3.9 (L) 10/28/2021   NEUTROABS 2.0 10/28/2021   HGB 10.8 (L) 10/28/2021   HCT 33.8 (L) 10/28/2021   MCV 98.0 10/28/2021   PLT 224 10/28/2021     STUDIES: No results found.  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  PLAN:    1.  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  Case discussed with [facility] head and neck physician, Dr. Ernst Bowler, and consensus was to  restart chemotherapy with carboplatinum, Taxol, and Keytruda on a 21-day cycle.  Plan to do 4 cycles and then reimage.  Patient will require some sort of maintenance treatment more than just single agent Keytruda.  Possibly Keytruda and low-dose Taxol or Keytruda plus lenvatinib.  Proceed with cycle 4, day 1 of treatment today.  Return to clinic in 1 week for further evaluation and consideration of cycle 4, day 8.  Patient also has an appointment with [facility], Dr. Ihor Austin, next week for trach removal.  He will then return to clinic in 3 weeks with repeat PET scan and Keytruda only.  Will also discuss other possible treatments necessary.    2.  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  PET scan results from November 12, 2019 reviewed independently with no obvious evidence of recurrent or progressive disease.  Level 2 lymph node is no longer hypermetabolic.  3.  Renal insufficiency: Chronic and unchanged.  Patient creatinine is 1.32 today. 4.  Anemia: Chronic and unchanged.  Patient's hemoglobin is 10.8. 5.  Hypokalemia: Resolved. 6.  Pulmonary embolism: Diagnosed in September 2022.  Continue Eliquis as prescribed.  Patient will require a minimum of 6 months of treatment. 7.  Leukopenia: Mild, proceed with treatment as above. 8.  Peripheral neuropathy: Chronic and unchanged.  Possibly related to Taxol.  Monitor closely now but Taxol is being reinitiated. 9.  Trach: Follow-up with ENT as above, possible removal next week.   10.  Rash: Resolved with steroid taper.  Patient expressed understanding and was in agreement with this plan. He also understands that He can call clinic at any time with any questions, concerns, or complaints.    Cancer Staging  Malignant neoplasm of supraglottis [facility]) Staging form: Larynx - Supraglottis, AJCC 8th Edition - Clinical stage from 12/11/2020: Stage IVC (cT3, cN2b, cM1) - Signed by  Lloyd Huger, MD on 01/26/2021  Primary squamous cell carcinoma of tonsil ([place]) Staging form: Pharynx - HPV-Mediated Oropharynx, AJCC 8th Edition - Clinical: No stage assigned - Unsigned   Lloyd Huger, MD   10/28/2021 2:32 PM

## 2021-10-28 ENCOUNTER — Inpatient Hospital Stay: Payer: Medicare Other

## 2021-10-28 ENCOUNTER — Inpatient Hospital Stay (HOSPITAL_BASED_OUTPATIENT_CLINIC_OR_DEPARTMENT_OTHER): Payer: Medicare Other | Admitting: Oncology

## 2021-10-28 ENCOUNTER — Inpatient Hospital Stay: Payer: Medicare Other | Attending: Oncology

## 2021-10-28 ENCOUNTER — Encounter: Payer: Self-pay | Admitting: Oncology

## 2021-10-28 VITALS — BP 118/71 | HR 81 | Temp 97.3°F | Resp 16 | Ht 69.0 in | Wt 210.0 lb

## 2021-10-28 VITALS — BP 114/75 | HR 75

## 2021-10-28 DIAGNOSIS — Z87891 Personal history of nicotine dependence: Secondary | ICD-10-CM | POA: Insufficient documentation

## 2021-10-28 DIAGNOSIS — D72819 Decreased white blood cell count, unspecified: Secondary | ICD-10-CM | POA: Diagnosis not present

## 2021-10-28 DIAGNOSIS — C321 Malignant neoplasm of supraglottis: Secondary | ICD-10-CM | POA: Insufficient documentation

## 2021-10-28 DIAGNOSIS — Z79899 Other long term (current) drug therapy: Secondary | ICD-10-CM | POA: Diagnosis not present

## 2021-10-28 DIAGNOSIS — Z5112 Encounter for antineoplastic immunotherapy: Secondary | ICD-10-CM | POA: Diagnosis present

## 2021-10-28 DIAGNOSIS — I129 Hypertensive chronic kidney disease with stage 1 through stage 4 chronic kidney disease, or unspecified chronic kidney disease: Secondary | ICD-10-CM | POA: Diagnosis not present

## 2021-10-28 DIAGNOSIS — Z930 Tracheostomy status: Secondary | ICD-10-CM | POA: Insufficient documentation

## 2021-10-28 DIAGNOSIS — Z7901 Long term (current) use of anticoagulants: Secondary | ICD-10-CM | POA: Insufficient documentation

## 2021-10-28 DIAGNOSIS — Z5111 Encounter for antineoplastic chemotherapy: Secondary | ICD-10-CM | POA: Diagnosis present

## 2021-10-28 DIAGNOSIS — C099 Malignant neoplasm of tonsil, unspecified: Secondary | ICD-10-CM

## 2021-10-28 DIAGNOSIS — I2699 Other pulmonary embolism without acute cor pulmonale: Secondary | ICD-10-CM | POA: Insufficient documentation

## 2021-10-28 DIAGNOSIS — Z629 Problem related to upbringing, unspecified: Secondary | ICD-10-CM | POA: Insufficient documentation

## 2021-10-28 DIAGNOSIS — G629 Polyneuropathy, unspecified: Secondary | ICD-10-CM | POA: Diagnosis not present

## 2021-10-28 DIAGNOSIS — N289 Disorder of kidney and ureter, unspecified: Secondary | ICD-10-CM | POA: Insufficient documentation

## 2021-10-28 DIAGNOSIS — D649 Anemia, unspecified: Secondary | ICD-10-CM | POA: Diagnosis not present

## 2021-10-28 LAB — CBC WITH DIFFERENTIAL/PLATELET
Abs Immature Granulocytes: 0.09 10*3/uL — ABNORMAL HIGH (ref 0.00–0.07)
Basophils Absolute: 0 10*3/uL (ref 0.0–0.1)
Basophils Relative: 1 %
Eosinophils Absolute: 0 10*3/uL (ref 0.0–0.5)
Eosinophils Relative: 1 %
HCT: 33.8 % — ABNORMAL LOW (ref 39.0–52.0)
Hemoglobin: 10.8 g/dL — ABNORMAL LOW (ref 13.0–17.0)
Immature Granulocytes: 2 %
Lymphocytes Relative: 20 %
Lymphs Abs: 0.8 10*3/uL (ref 0.7–4.0)
MCH: 31.3 pg (ref 26.0–34.0)
MCHC: 32 g/dL (ref 30.0–36.0)
MCV: 98 fL (ref 80.0–100.0)
Monocytes Absolute: 0.9 10*3/uL (ref 0.1–1.0)
Monocytes Relative: 24 %
Neutro Abs: 2 10*3/uL (ref 1.7–7.7)
Neutrophils Relative %: 52 %
Platelets: 224 10*3/uL (ref 150–400)
RBC: 3.45 MIL/uL — ABNORMAL LOW (ref 4.22–5.81)
RDW: 16.3 % — ABNORMAL HIGH (ref 11.5–15.5)
WBC: 3.9 10*3/uL — ABNORMAL LOW (ref 4.0–10.5)
nRBC: 0 % (ref 0.0–0.2)

## 2021-10-28 LAB — COMPREHENSIVE METABOLIC PANEL
ALT: 18 U/L (ref 0–44)
AST: 18 U/L (ref 15–41)
Albumin: 3.6 g/dL (ref 3.5–5.0)
Alkaline Phosphatase: 47 U/L (ref 38–126)
Anion gap: 7 (ref 5–15)
BUN: 19 mg/dL (ref 8–23)
CO2: 25 mmol/L (ref 22–32)
Calcium: 8.6 mg/dL — ABNORMAL LOW (ref 8.9–10.3)
Chloride: 105 mmol/L (ref 98–111)
Creatinine, Ser: 1.19 mg/dL (ref 0.61–1.24)
GFR, Estimated: >60 mL/min (ref >60)
Glucose, Bld: 108 mg/dL — ABNORMAL HIGH (ref 70–99)
Potassium: 3.7 mmol/L (ref 3.5–5.1)
Sodium: 137 mmol/L (ref 135–145)
Total Bilirubin: 0.6 mg/dL (ref 0.3–1.2)
Total Protein: 6.8 g/dL (ref 6.5–8.1)

## 2021-10-28 MED ORDER — SODIUM CHLORIDE 0.9 % IV SOLN
80.0000 mg/m2 | Freq: Once | INTRAVENOUS | Status: AC
  Administered 2021-10-28: 168 mg via INTRAVENOUS
  Filled 2021-10-28: qty 28

## 2021-10-28 MED ORDER — SODIUM CHLORIDE 0.9% FLUSH
10.0000 mL | Freq: Once | INTRAVENOUS | Status: AC
  Administered 2021-10-28: 10 mL via INTRAVENOUS
  Filled 2021-10-28: qty 10

## 2021-10-28 MED ORDER — SODIUM CHLORIDE 0.9 % IV SOLN
10.0000 mg | Freq: Once | INTRAVENOUS | Status: AC
  Administered 2021-10-28: 10 mg via INTRAVENOUS
  Filled 2021-10-28: qty 10

## 2021-10-28 MED ORDER — DIPHENHYDRAMINE HCL 50 MG/ML IJ SOLN
25.0000 mg | Freq: Once | INTRAMUSCULAR | Status: AC
  Administered 2021-10-28: 25 mg via INTRAVENOUS
  Filled 2021-10-28: qty 1

## 2021-10-28 MED ORDER — SODIUM CHLORIDE 0.9 % IV SOLN
150.0000 mg | Freq: Once | INTRAVENOUS | Status: AC
  Administered 2021-10-28: 150 mg via INTRAVENOUS
  Filled 2021-10-28: qty 5

## 2021-10-28 MED ORDER — SODIUM CHLORIDE 0.9 % IV SOLN
200.0000 mg | Freq: Once | INTRAVENOUS | Status: AC
  Administered 2021-10-28: 200 mg via INTRAVENOUS
  Filled 2021-10-28: qty 20

## 2021-10-28 MED ORDER — HEPARIN SOD (PORK) LOCK FLUSH 100 UNIT/ML IV SOLN
500.0000 [IU] | Freq: Once | INTRAVENOUS | Status: AC | PRN
  Administered 2021-10-28: 500 [IU]
  Filled 2021-10-28: qty 5

## 2021-10-28 MED ORDER — SODIUM CHLORIDE 0.9 % IV SOLN
Freq: Once | INTRAVENOUS | Status: AC
  Filled 2021-10-28: qty 250

## 2021-10-28 MED ORDER — SODIUM CHLORIDE 0.9 % IV SOLN
200.0000 mg | Freq: Once | INTRAVENOUS | Status: AC
  Administered 2021-10-28: 200 mg via INTRAVENOUS
  Filled 2021-10-28: qty 8

## 2021-10-28 MED ORDER — PALONOSETRON HCL INJECTION 0.25 MG/5ML
0.2500 mg | Freq: Once | INTRAVENOUS | Status: AC
  Administered 2021-10-28: 0.25 mg via INTRAVENOUS
  Filled 2021-10-28: qty 5

## 2021-10-28 MED ORDER — FAMOTIDINE IN NACL 20-0.9 MG/50ML-% IV SOLN
20.0000 mg | Freq: Once | INTRAVENOUS | Status: AC
  Administered 2021-10-28: 20 mg via INTRAVENOUS
  Filled 2021-10-28: qty 50

## 2021-10-28 NOTE — Patient Instructions (Signed)
[facility] [place]  Discharge Instructions: Thank you for choosing [facility] to provide your oncology and hematology care.  If you have a lab appointment with the Cancer Center, please go directly to the Cancer Center and check in at the registration area.  Wear comfortable clothing and clothing appropriate for easy access to any Portacath or PICC line.   We strive to give you quality time with your provider. You may need to reschedule your appointment if you arrive late (15 or more minutes).  Arriving late affects you and other patients whose appointments are after yours.  Also, if you miss three or more appointments without notifying the office, you may be dismissed from the clinic at the provider's discretion.      For prescription refill requests, have your pharmacy contact our office and allow 72 hours for refills to be completed.       To help prevent nausea and vomiting after your treatment, we encourage you to take your nausea medication as directed.  BELOW ARE SYMPTOMS THAT SHOULD BE REPORTED IMMEDIATELY: *FEVER GREATER THAN 100.4 F (38 C) OR HIGHER *CHILLS OR SWEATING *NAUSEA AND VOMITING THAT IS NOT CONTROLLED WITH YOUR NAUSEA MEDICATION *UNUSUAL SHORTNESS OF BREATH *UNUSUAL BRUISING OR BLEEDING *URINARY PROBLEMS (pain or burning when urinating, or frequent urination) *BOWEL PROBLEMS (unusual diarrhea, constipation, pain near the anus) TENDERNESS IN MOUTH AND THROAT WITH OR WITHOUT PRESENCE OF ULCERS (sore throat, sores in mouth, or a toothache) UNUSUAL RASH, SWELLING OR PAIN  UNUSUAL VAGINAL DISCHARGE OR ITCHING   Items with * indicate a potential emergency and should be followed up as soon as possible or go to the Emergency Department if any problems should occur.  Please show the CHEMOTHERAPY ALERT CARD or IMMUNOTHERAPY ALERT CARD at check-in to the Emergency Department and triage nurse.  Should you have questions after your  visit or need to cancel or reschedule your appointment, please contact [facility] [place]  [phone] and follow the prompts.  Office hours are 8:00 a.m. to 4:30 p.m. Monday - Friday. Please note that voicemails left after 4:00 p.m. may not be returned until the following business day.  We are closed weekends and major holidays. You have access to a nurse at all times for urgent questions. Please call the main number to the clinic [phone] and follow the prompts.  For any non-urgent questions, you may also contact your provider using MyChart. We now offer e-Visits for anyone 18 and older to request care online for non-urgent symptoms. For details visit [email or web address].   Also download the MyChart app! Go to the app store, search "MyChart", open the app, select [place] [facility], and log in with your MyChart username and password.  Masks are optional in the cancer centers. If you would like for your care team to wear a mask while they are taking care of you, please let them know. For doctor visits, patients may have with them one support person who is at least 66 years old. At this time, visitors are not allowed in the infusion area.   

## 2021-10-29 ENCOUNTER — Encounter: Payer: Self-pay | Admitting: Oncology

## 2021-11-04 ENCOUNTER — Ambulatory Visit: Payer: Medicare Other | Admitting: Oncology

## 2021-11-04 ENCOUNTER — Ambulatory Visit: Payer: Medicare Other

## 2021-11-04 ENCOUNTER — Other Ambulatory Visit: Payer: Medicare Other

## 2021-11-04 MED FILL — Dexamethasone Sodium Phosphate Inj 100 MG/10ML: INTRAMUSCULAR | Qty: 1 | Status: AC

## 2021-11-04 MED FILL — Fosaprepitant Dimeglumine For IV Infusion 150 MG (Base Eq): INTRAVENOUS | Qty: 5 | Status: AC

## 2021-11-05 ENCOUNTER — Inpatient Hospital Stay: Payer: Medicare Other

## 2021-11-05 ENCOUNTER — Inpatient Hospital Stay (HOSPITAL_BASED_OUTPATIENT_CLINIC_OR_DEPARTMENT_OTHER): Payer: Medicare Other | Admitting: Medical Oncology

## 2021-11-05 ENCOUNTER — Encounter: Payer: Self-pay | Admitting: Medical Oncology

## 2021-11-05 VITALS — BP 115/56 | HR 73 | Temp 98.7°F | Resp 20 | Wt 214.0 lb

## 2021-11-05 DIAGNOSIS — E876 Hypokalemia: Secondary | ICD-10-CM

## 2021-11-05 DIAGNOSIS — N289 Disorder of kidney and ureter, unspecified: Secondary | ICD-10-CM

## 2021-11-05 DIAGNOSIS — Z5112 Encounter for antineoplastic immunotherapy: Secondary | ICD-10-CM | POA: Diagnosis not present

## 2021-11-05 DIAGNOSIS — C321 Malignant neoplasm of supraglottis: Secondary | ICD-10-CM

## 2021-11-05 DIAGNOSIS — C099 Malignant neoplasm of tonsil, unspecified: Secondary | ICD-10-CM

## 2021-11-05 DIAGNOSIS — D6481 Anemia due to antineoplastic chemotherapy: Secondary | ICD-10-CM | POA: Diagnosis not present

## 2021-11-05 DIAGNOSIS — G620 Drug-induced polyneuropathy: Secondary | ICD-10-CM

## 2021-11-05 DIAGNOSIS — D72819 Decreased white blood cell count, unspecified: Secondary | ICD-10-CM

## 2021-11-05 DIAGNOSIS — Z930 Tracheostomy status: Secondary | ICD-10-CM

## 2021-11-05 DIAGNOSIS — T451X5A Adverse effect of antineoplastic and immunosuppressive drugs, initial encounter: Secondary | ICD-10-CM

## 2021-11-05 DIAGNOSIS — I2699 Other pulmonary embolism without acute cor pulmonale: Secondary | ICD-10-CM

## 2021-11-05 LAB — COMPREHENSIVE METABOLIC PANEL
ALT: 19 U/L (ref 0–44)
AST: 19 U/L (ref 15–41)
Albumin: 3.6 g/dL (ref 3.5–5.0)
Alkaline Phosphatase: 42 U/L (ref 38–126)
Anion gap: 3 — ABNORMAL LOW (ref 5–15)
BUN: 25 mg/dL — ABNORMAL HIGH (ref 8–23)
CO2: 24 mmol/L (ref 22–32)
Calcium: 8.6 mg/dL — ABNORMAL LOW (ref 8.9–10.3)
Chloride: 111 mmol/L (ref 98–111)
Creatinine, Ser: 1.72 mg/dL — ABNORMAL HIGH (ref 0.61–1.24)
GFR, Estimated: 44 mL/min — ABNORMAL LOW (ref >60)
Glucose, Bld: 153 mg/dL — ABNORMAL HIGH (ref 70–99)
Potassium: 3.6 mmol/L (ref 3.5–5.1)
Sodium: 138 mmol/L (ref 135–145)
Total Bilirubin: 0.4 mg/dL (ref 0.3–1.2)
Total Protein: 6.6 g/dL (ref 6.5–8.1)

## 2021-11-05 LAB — CBC WITH DIFFERENTIAL/PLATELET
Abs Immature Granulocytes: 0.09 10*3/uL — ABNORMAL HIGH (ref 0.00–0.07)
Basophils Absolute: 0 10*3/uL (ref 0.0–0.1)
Basophils Relative: 0 %
Eosinophils Absolute: 0.1 10*3/uL (ref 0.0–0.5)
Eosinophils Relative: 1 %
HCT: 31.6 % — ABNORMAL LOW (ref 39.0–52.0)
Hemoglobin: 10.3 g/dL — ABNORMAL LOW (ref 13.0–17.0)
Immature Granulocytes: 1 %
Lymphocytes Relative: 13 %
Lymphs Abs: 0.9 10*3/uL (ref 0.7–4.0)
MCH: 32.2 pg (ref 26.0–34.0)
MCHC: 32.6 g/dL (ref 30.0–36.0)
MCV: 98.8 fL (ref 80.0–100.0)
Monocytes Absolute: 0.5 10*3/uL (ref 0.1–1.0)
Monocytes Relative: 8 %
Neutro Abs: 5.3 10*3/uL (ref 1.7–7.7)
Neutrophils Relative %: 77 %
Platelets: 220 10*3/uL (ref 150–400)
RBC: 3.2 MIL/uL — ABNORMAL LOW (ref 4.22–5.81)
RDW: 15.9 % — ABNORMAL HIGH (ref 11.5–15.5)
WBC: 6.8 10*3/uL (ref 4.0–10.5)
nRBC: 0 % (ref 0.0–0.2)

## 2021-11-05 MED ORDER — SODIUM CHLORIDE 0.9 % IV SOLN
80.0000 mg/m2 | Freq: Once | INTRAVENOUS | Status: AC
  Administered 2021-11-05: 168 mg via INTRAVENOUS
  Filled 2021-11-05: qty 28

## 2021-11-05 MED ORDER — SODIUM CHLORIDE 0.9 % IV SOLN
10.0000 mg | Freq: Once | INTRAVENOUS | Status: AC
  Administered 2021-11-05: 10 mg via INTRAVENOUS
  Filled 2021-11-05: qty 10

## 2021-11-05 MED ORDER — SODIUM CHLORIDE 0.9 % IV SOLN
Freq: Once | INTRAVENOUS | Status: AC
  Filled 2021-11-05: qty 250

## 2021-11-05 MED ORDER — FAMOTIDINE IN NACL 20-0.9 MG/50ML-% IV SOLN
20.0000 mg | Freq: Once | INTRAVENOUS | Status: AC
  Administered 2021-11-05: 20 mg via INTRAVENOUS
  Filled 2021-11-05: qty 50

## 2021-11-05 MED ORDER — SODIUM CHLORIDE 0.9 % IV SOLN
150.0000 mg | Freq: Once | INTRAVENOUS | Status: AC
  Administered 2021-11-05: 150 mg via INTRAVENOUS
  Filled 2021-11-05: qty 150

## 2021-11-05 MED ORDER — HEPARIN SOD (PORK) LOCK FLUSH 100 UNIT/ML IV SOLN
500.0000 [IU] | Freq: Once | INTRAVENOUS | Status: AC | PRN
  Administered 2021-11-05: 500 [IU]
  Filled 2021-11-05: qty 5

## 2021-11-05 MED ORDER — SODIUM CHLORIDE 0.9 % IV SOLN
100.0000 mg | Freq: Once | INTRAVENOUS | Status: AC
  Administered 2021-11-05: 100 mg via INTRAVENOUS
  Filled 2021-11-05: qty 10

## 2021-11-05 MED ORDER — PALONOSETRON HCL INJECTION 0.25 MG/5ML
0.2500 mg | Freq: Once | INTRAVENOUS | Status: AC
  Administered 2021-11-05: 0.25 mg via INTRAVENOUS
  Filled 2021-11-05: qty 5

## 2021-11-05 MED ORDER — DIPHENHYDRAMINE HCL 50 MG/ML IJ SOLN
25.0000 mg | Freq: Once | INTRAMUSCULAR | Status: AC
  Administered 2021-11-05: 25 mg via INTRAVENOUS
  Filled 2021-11-05: qty 1

## 2021-11-05 NOTE — Patient Instructions (Signed)
[facility] AT [place]  Discharge Instructions: Thank you for choosing [facility] to provide your oncology and hematology care.  If you have a lab appointment with the [facility], please go directly to the [facility] and check in at the registration area.  Wear comfortable clothing and clothing appropriate for easy access to any Portacath or PICC line.   We strive to give you quality time with your provider. You may need to reschedule your appointment if you arrive late (15 or more minutes).  Arriving late affects you and other patients whose appointments are after yours.  Also, if you miss three or more appointments without notifying the office, you may be dismissed from the clinic at the provider's discretion.      For prescription refill requests, have your pharmacy contact our office and allow 72 hours for refills to be completed.    Today you received the following chemotherapy and/or immunotherapy agents : Carbo/ Taxol   To help prevent nausea and vomiting after your treatment, we encourage you to take your nausea medication as directed.  BELOW ARE SYMPTOMS THAT SHOULD BE REPORTED IMMEDIATELY: *FEVER GREATER THAN 100.4 F (38 C) OR HIGHER *CHILLS OR SWEATING *NAUSEA AND VOMITING THAT IS NOT CONTROLLED WITH YOUR NAUSEA MEDICATION *UNUSUAL SHORTNESS OF BREATH *UNUSUAL BRUISING OR BLEEDING *URINARY PROBLEMS (pain or burning when urinating, or frequent urination) *BOWEL PROBLEMS (unusual diarrhea, constipation, pain near the anus) TENDERNESS IN MOUTH AND THROAT WITH OR WITHOUT PRESENCE OF ULCERS (sore throat, sores in mouth, or a toothache) UNUSUAL RASH, SWELLING OR PAIN  UNUSUAL VAGINAL DISCHARGE OR ITCHING   Items with * indicate a potential emergency and should be followed up as soon as possible or go to the Emergency Department if any problems should occur.  Please show the CHEMOTHERAPY ALERT CARD or IMMUNOTHERAPY ALERT CARD at check-in to  the Emergency Department and triage nurse.  Should you have questions after your visit or need to cancel or reschedule your appointment, please contact [facility] [place] [facility] [place]  [phone] and follow the prompts.  Office hours are 8:00 a.m. to 4:30 p.m. Monday - Friday. Please note that voicemails left after 4:00 p.m. may not be returned until the following business day.  We are closed weekends and major holidays. You have access to a nurse at all times for urgent questions. Please call the main number to the clinic [phone] and follow the prompts.  For any non-urgent questions, you may also contact your provider using MyChart. We now offer e-Visits for anyone 11 and older to request care online for non-urgent symptoms. For details visit [email or web address].   Also download the MyChart app! Go to the app store, search "MyChart", open the app, select [place], and log in with your MyChart username and password.  Masks are optional in the cancer centers. If you would like for your care team to wear a mask while they are taking care of you, please let them know. For doctor visits, patients may have with them one support person who is at least 66 years old. At this time, visitors are not allowed in the infusion area.

## 2021-11-05 NOTE — Progress Notes (Signed)
Patient tolerated 1 L IVF & Carbo/ Taxol infusion well, no questions/concerns voiced. Patient stable at discharge. AVS given.

## 2021-11-05 NOTE — Progress Notes (Signed)
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  INTERVAL HISTORY: Patient returns to clinic today for further evaluation and consideration of cycle 4, day 8 of carboplatinum, Taxol. He continues to feel well.  He is tolerating his treatments without significant side effects. He has a chronic peripheral neuropathy that is unchanged.  He has no other neurologic complaints.  He denies any pain today.  He denies any recent fevers or illnesses. He has no chest pain, shortness of breath, cough, or hemoptysis.  He denies any nausea, vomiting, constipation, or diarrhea.  He has no urinary complaints.  Patient offers no further specific complaints today.  REVIEW OF SYSTEMS:   Review of Systems  Constitutional: Negative.  Negative for fever, malaise/fatigue and weight loss.  Respiratory: Negative.  Negative for cough, hemoptysis and shortness of breath.   Cardiovascular: Negative.  Negative for chest pain and leg swelling.  Gastrointestinal: Negative.  Negative for abdominal pain.  Genitourinary: Negative.  Negative for dysuria.  Musculoskeletal: Negative.  Negative for neck pain.  Skin: Negative.  Negative for itching and rash.  Neurological:  Positive for tingling. Negative for dizziness, focal weakness, weakness and headaches.  Psychiatric/Behavioral: Negative.  The patient is not nervous/anxious.     As per HPI. Otherwise, a complete review of systems is negative.  PAST MEDICAL HISTORY: Past Medical History:  Diagnosis Date   Cancer [facility])    Coronary artery  disease    Hypertension     PAST SURGICAL HISTORY: Past Surgical History:  Procedure Laterality Date   CORONARY ARTERY BYPASS GRAFT  2010   Quadruple   DIRECT LARYNGOSCOPY  11/28/2020   Procedure: DIRECT LARYNGOSCOPY WITH BIOPSY;  Surgeon: Beverly Gust, MD;  Location: [facility] ORS;  Service: ENT;;   IR IMAGING GUIDED PORT INSERTION  01/01/2021   PULMONARY THROMBECTOMY Bilateral 01/13/2021   Procedure: PULMONARY THROMBECTOMY;  Surgeon: Katha Cabal, MD;  Location: [place] [facility];  Service: Cardiovascular;  Laterality: Bilateral;   TRACHEOSTOMY TUBE PLACEMENT N/A 11/28/2020   Procedure: AWAKE TRACHEOSTOMY;  Surgeon: Beverly Gust, MD;  Location: [facility] ORS;  Service: ENT;  Laterality: N/A;    FAMILY HISTORY: Family History  Problem Relation Age of Onset   Arthritis Mother    Heart attack Father    Brain cancer Sister     ADVANCED DIRECTIVES (Y/N):  N  HEALTH MAINTENANCE: Social History   Tobacco Use   Smoking status: Former    Types: Cigarettes    Quit date: 10/17/2008    Years since quitting: 13.0   Smokeless tobacco: Never  Vaping Use   Vaping Use: Never used  Substance Use Topics   Alcohol use: Yes    Comment: occasionally   Drug use: Never     Colonoscopy:  PAP:  Bone density:  Lipid panel:  No Known Allergies  Current Outpatient Medications  Medication Sig Dispense Refill   amLODipine (NORVASC) 10 MG tablet Take 10 mg by mouth daily.     apixaban (ELIQUIS) 5 MG TABS tablet Take 1 tablet (5 mg total) by mouth 2 (two) times daily. 60 tablet 3   ascorbic acid (VITAMIN C) 500 MG tablet Take  by mouth.     aspirin 81 MG chewable tablet Chew by mouth.     cyanocobalamin 1000 MCG tablet Take by mouth.     DENTA 5000 PLUS 1.1 % CREA dental cream      gentamicin ointment (GARAMYCIN) 0.1 % Apply topically.     hydrALAZINE (APRESOLINE) 50 MG tablet Take 50 mg by mouth in the morning and at bedtime. 1.5 tab BID     metoprolol succinate (TOPROL-XL) 25 MG 24  hr tablet Take 25 mg by mouth daily.     olmesartan (BENICAR) 40 MG tablet Take 40 mg by mouth daily.     rosuvastatin (CRESTOR) 40 MG tablet Take 40 mg by mouth daily.     ALPRAZolam (XANAX) 0.5 MG tablet Take 1 tablet (0.5 mg total) by mouth daily as needed for anxiety (Take 1 hour prior to radiation). (Patient not taking: Reported on 04/14/2021) 30 tablet 1   betamethasone valerate ointment (VALISONE) 0.1 % Apply 1 application. topically 2 (two) times daily. For up to 2 weeks (Patient not taking: Reported on 09/23/2021) 30 g 0   fluticasone (FLONASE) 50 MCG/ACT nasal spray Place 1 spray into both nostrils daily. (Patient not taking: Reported on 10/14/2021) 15.8 mL 0   ipratropium-albuterol (DUONEB) 0.5-2.5 (3) MG/3ML SOLN Take 3 mLs by nebulization every 6 (six) hours. (Patient not taking: Reported on 03/24/2021) 360 mL 0   ondansetron (ZOFRAN) 8 MG tablet Take 1 tablet (8 mg total) by mouth every 8 (eight) hours as needed for nausea or vomiting. (Patient not taking: Reported on 04/14/2021) 60 tablet 2   predniSONE (DELTASONE) 10 MG tablet Take 6 tablets (60 mg total) by mouth daily with breakfast. Then start taper pack. (Patient not taking: Reported on 09/23/2021) 42 tablet 0   predniSONE (STERAPRED UNI-PAK 21 TAB) 10 MG (21) TBPK tablet 6 tablets (60 mg total) by mouth first day, 5 tablets second day, 4 tablets third day, 3 tablets fourth day, 2 fifth day and 1 tablet last day for a total of 21 tablets (Patient not taking: Reported on 10/07/2021) 21 tablet 0   prochlorperazine (COMPAZINE) 10 MG tablet Take 1 tablet (10 mg total) by mouth every 6 (six) hours as needed for nausea or vomiting. (Patient not taking: Reported on 09/23/2021) 60 tablet 2   No current facility-administered medications for this visit.   Facility-Administered Medications Ordered in Other Visits  Medication Dose Route Frequency Provider Last Rate Last Admin   CARBOplatin (PARAPLATIN) 100 mg in sodium chloride 0.9 % 100 mL chemo  infusion  100 mg Intravenous Once Lloyd Huger, MD       heparin lock flush 100 UNIT/ML injection            heparin lock flush 100 unit/mL  500 Units Intravenous Once Grayland Ormond, Kathlene November, MD       heparin lock flush 100 unit/mL  500 Units Intracatheter Once PRN Lloyd Huger, MD       PACLitaxel (TAXOL) 168 mg in sodium chloride 0.9 % 250 mL chemo infusion (</= '80mg'$ /m2)  80 mg/m2 (Treatment Plan Recorded) Intravenous Once Lloyd Huger, MD 278 mL/hr at 11/05/21 1310 168 mg at 11/05/21 1310   sodium chloride flush (NS) 0.9 % injection 10 mL  10 mL Intravenous PRN Lloyd Huger, MD   10 mL at 01/06/21 1305    OBJECTIVE: Vitals:   11/05/21 0952  BP: (!) 115/56  Pulse: 73  Resp: 20  Temp: 98.7 F (37.1 C)  SpO2:  100%      Body mass index is 31.6 kg/m.    ECOG FS:0 - Asymptomatic  General: Well-developed, well-nourished, no acute distress. Eyes: Pink conjunctiva, anicteric sclera. HEENT: Normocephalic, moist mucous membranes.  Trach in place. Lungs: No audible wheezing or coughing. Heart: Regular rate and rhythm. Abdomen: Soft, nontender, no obvious distention. Musculoskeletal: No edema, cyanosis, or clubbing. Neuro: Alert, answering all questions appropriately. Cranial nerves grossly intact. Skin: No rashes or petechiae noted. Psych: Normal affect.  LAB RESULTS:  Lab Results  Component Value Date   NA 138 11/05/2021   K 3.6 11/05/2021   CL 111 11/05/2021   CO2 24 11/05/2021   GLUCOSE 153 (H) 11/05/2021   BUN 25 (H) 11/05/2021   CREATININE 1.72 (H) 11/05/2021   CALCIUM 8.6 (L) 11/05/2021   PROT 6.6 11/05/2021   ALBUMIN 3.6 11/05/2021   AST 19 11/05/2021   ALT 19 11/05/2021   ALKPHOS 42 11/05/2021   BILITOT 0.4 11/05/2021   GFRNONAA 44 (L) 11/05/2021   GFRAA 37 (L) 11/13/2019    Lab Results  Component Value Date   WBC 6.8 11/05/2021   NEUTROABS 5.3 11/05/2021   HGB 10.3 (L) 11/05/2021   HCT 31.6 (L) 11/05/2021   MCV 98.8 11/05/2021    PLT 220 11/05/2021     STUDIES: No results found.  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  PLAN:    1.  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  Case discussed with [facility] head and neck physician, Dr. Ernst Bowler, and consensus was to restart chemotherapy with carboplatinum, Taxol, and Keytruda on a 21-day cycle.  Plan to do 4 cycles and then reimage.  Patient will require some sort of maintenance treatment more than just single agent Keytruda.  Possibly Keytruda and low-dose Taxol or Keytruda plus lenvatinib.  Proceed with cycle 4, day 1 of treatment today.  Return to clinic in 1 week for further evaluation and consideration of cycle 4, day 8.  Patient also has an appointment with [facility], Dr. Ihor Austin, next week for trach removal.  He will then return to clinic in 3 weeks with repeat PET scan and Keytruda only.  Will also discuss other possible treatments necessary.    2.  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  PET scan results from November 12, 2019 reviewed independently with no obvious evidence of recurrent or progressive disease.  Level 2 lymph node is no longer hypermetabolic.  3.  Renal insufficiency: Chronic and acutely worsened. Patient creatinine is 1.7 today. Discussed with pharmacy team. 50% reduction in his carboplatinum. Taxol as previously dosed. Adding on an extra L of IVF to support kidney function. Follow up next week for labs.  4.  Anemia: Chronic and unchanged.  Patient's hemoglobin is 10.8. 5.  Hypokalemia: Resolved. We will continue to monitor.  6.  Pulmonary embolism: Diagnosed in September 2022.  Continue Eliquis as prescribed.  Patient will require a minimum of 6 months of treatment. No sign of  worsening disease today.  7.  Leukopenia: Mild, proceed with treatment as above. 8.  Peripheral neuropathy: Chronic and unchanged.  Possibly related to Taxol.  Monitor closely now but Taxol is being reinitiated. 9.  Trach: Follow-up with ENT as above, possible removal next week.  Patient expressed understanding and was in agreement with this plan. He also understands that He can call clinic at any time with any questions, concerns, or complaints.    Cancer Staging  Malignant neoplasm of supraglottis [facility]) Staging form: Larynx - Supraglottis, AJCC 8th Edition - Clinical stage from 12/11/2020: Stage IVC (cT3, cN2b, cM1) - Signed by Lloyd Huger, MD on 01/26/2021  Primary squamous cell carcinoma of tonsil ([place]) Staging form: Pharynx - HPV-Mediated Oropharynx, AJCC 8th Edition - Clinical: No stage assigned - Lavonda Jumbo, PA-C   11/05/2021 1:31 PM

## 2021-11-05 NOTE — Progress Notes (Signed)
Per NP carboplatin will be dose reduced 50%.

## 2021-11-09 ENCOUNTER — Other Ambulatory Visit: Payer: Self-pay

## 2021-11-11 ENCOUNTER — Encounter
Admission: RE | Admit: 2021-11-11 | Discharge: 2021-11-11 | Disposition: A | Discharge status: Home / Self Care | Payer: Medicare Other | Source: Ambulatory Visit | Attending: Oncology | Admitting: Oncology

## 2021-11-11 DIAGNOSIS — C321 Malignant neoplasm of supraglottis: Secondary | ICD-10-CM | POA: Insufficient documentation

## 2021-11-11 LAB — GLUCOSE, CAPILLARY: Glucose-Capillary: 94 mg/dL (ref 70–99)

## 2021-11-11 MED ORDER — FLUDEOXYGLUCOSE F - 18 (FDG) INJECTION
11.1000 | Freq: Once | INTRAVENOUS | Status: AC | PRN
  Administered 2021-11-11: 11.59 via INTRAVENOUS

## 2021-11-12 ENCOUNTER — Inpatient Hospital Stay (HOSPITAL_BASED_OUTPATIENT_CLINIC_OR_DEPARTMENT_OTHER): Payer: Medicare Other | Admitting: Oncology

## 2021-11-12 ENCOUNTER — Inpatient Hospital Stay: Payer: Medicare Other

## 2021-11-12 VITALS — BP 118/59 | HR 69 | Temp 96.9°F | Resp 18

## 2021-11-12 DIAGNOSIS — C321 Malignant neoplasm of supraglottis: Secondary | ICD-10-CM | POA: Diagnosis not present

## 2021-11-12 DIAGNOSIS — Z5112 Encounter for antineoplastic immunotherapy: Secondary | ICD-10-CM | POA: Diagnosis not present

## 2021-11-12 LAB — BASIC METABOLIC PANEL
Anion gap: 8 (ref 5–15)
BUN: 28 mg/dL — ABNORMAL HIGH (ref 8–23)
CO2: 24 mmol/L (ref 22–32)
Calcium: 9.2 mg/dL (ref 8.9–10.3)
Chloride: 110 mmol/L (ref 98–111)
Creatinine, Ser: 1.66 mg/dL — ABNORMAL HIGH (ref 0.61–1.24)
GFR, Estimated: 45 mL/min — ABNORMAL LOW (ref >60)
Glucose, Bld: 90 mg/dL (ref 70–99)
Potassium: 4.3 mmol/L (ref 3.5–5.1)
Sodium: 142 mmol/L (ref 135–145)

## 2021-11-12 NOTE — Progress Notes (Signed)
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  INTERVAL HISTORY: Patient returns to clinic today for further evaluation, discussion of his PET scan results, and treatment planning.  He had an appointment with [facility]NT yesterday and is disappointed because plans to remove his trach did not occur.  He otherwise feels well. He is tolerating his treatments without significant side effects. He has a chronic peripheral neuropathy that is unchanged.  He has no other neurologic complaints.  He denies any pain today.  He denies any recent fevers or illnesses. He has no chest pain, shortness of breath, cough, or hemoptysis.  He denies any nausea, vomiting, constipation, or diarrhea.  He has no urinary complaints.  Patient offers no further specific complaints today.  REVIEW OF SYSTEMS:   Review of Systems  Constitutional: Negative.  Negative for fever, malaise/fatigue and weight loss.  Respiratory: Negative.  Negative for cough, hemoptysis and shortness of breath.   Cardiovascular: Negative.  Negative for chest pain and leg swelling.  Gastrointestinal: Negative.  Negative for abdominal pain.  Genitourinary: Negative.  Negative for dysuria.  Musculoskeletal: Negative.  Negative for neck pain.  Skin: Negative.  Negative for itching and rash.  Neurological:  Positive for tingling. Negative for dizziness, focal weakness, weakness and headaches.  Psychiatric/Behavioral: Negative.  The patient is not nervous/anxious.     As per HPI. Otherwise, a complete review of systems is  negative.  PAST MEDICAL HISTORY: Past Medical History:  Diagnosis Date   Cancer [facility])    Coronary artery disease    Hypertension     PAST SURGICAL HISTORY: Past Surgical History:  Procedure Laterality Date   CORONARY ARTERY BYPASS GRAFT  2010   Quadruple   DIRECT LARYNGOSCOPY  11/28/2020   Procedure: DIRECT LARYNGOSCOPY WITH BIOPSY;  Surgeon: Beverly Gust, MD;  Location: [facility] ORS;  Service: ENT;;   IR IMAGING GUIDED PORT INSERTION  01/01/2021   PULMONARY THROMBECTOMY Bilateral 01/13/2021   Procedure: PULMONARY THROMBECTOMY;  Surgeon: Katha Cabal, MD;  Location: [place] [facility];  Service: Cardiovascular;  Laterality: Bilateral;   TRACHEOSTOMY TUBE PLACEMENT N/A 11/28/2020   Procedure: AWAKE TRACHEOSTOMY;  Surgeon: Beverly Gust, MD;  Location: [facility] ORS;  Service: ENT;  Laterality: N/A;    FAMILY HISTORY: Family History  Problem Relation Age of Onset   Arthritis Mother    Heart attack Father    Brain cancer Sister     ADVANCED DIRECTIVES (Y/N):  N  HEALTH MAINTENANCE: Social History   Tobacco Use   Smoking status: Former    Types: Cigarettes    Quit date: 10/17/2008    Years since quitting: 13.0   Smokeless tobacco: Never  Vaping Use   Vaping Use: Never used  Substance Use Topics   Alcohol use: Yes    Comment: occasionally   Drug use: Never     Colonoscopy:  PAP:  Bone density:  Lipid panel:  No Known Allergies  Current Outpatient Medications  Medication Sig Dispense Refill   amLODipine (NORVASC) 10 MG tablet Take 10 mg by mouth daily.     apixaban (ELIQUIS) 5 MG TABS tablet Take 1 tablet (5 mg total)  by mouth 2 (two) times daily. 60 tablet 3   ascorbic acid (VITAMIN C) 500 MG tablet Take by mouth.     aspirin 81 MG chewable tablet Chew by mouth.     cyanocobalamin 1000 MCG tablet Take by mouth.     DENTA 5000 PLUS 1.1 % CREA dental cream      gentamicin ointment (GARAMYCIN) 0.1 % Apply topically.     hydrALAZINE (APRESOLINE) 50 MG tablet  Take 50 mg by mouth in the morning and at bedtime. 1.5 tab BID     metoprolol succinate (TOPROL-XL) 25 MG 24 hr tablet Take 25 mg by mouth daily.     olmesartan (BENICAR) 40 MG tablet Take 40 mg by mouth daily.     rosuvastatin (CRESTOR) 40 MG tablet Take 40 mg by mouth daily.     ALPRAZolam (XANAX) 0.5 MG tablet Take 1 tablet (0.5 mg total) by mouth daily as needed for anxiety (Take 1 hour prior to radiation). (Patient not taking: Reported on 04/14/2021) 30 tablet 1   betamethasone valerate ointment (VALISONE) 0.1 % Apply 1 application. topically 2 (two) times daily. For up to 2 weeks (Patient not taking: Reported on 09/23/2021) 30 g 0   fluticasone (FLONASE) 50 MCG/ACT nasal spray Place 1 spray into both nostrils daily. (Patient not taking: Reported on 10/14/2021) 15.8 mL 0   ipratropium-albuterol (DUONEB) 0.5-2.5 (3) MG/3ML SOLN Take 3 mLs by nebulization every 6 (six) hours. (Patient not taking: Reported on 03/24/2021) 360 mL 0   ondansetron (ZOFRAN) 8 MG tablet Take 1 tablet (8 mg total) by mouth every 8 (eight) hours as needed for nausea or vomiting. (Patient not taking: Reported on 04/14/2021) 60 tablet 2   predniSONE (DELTASONE) 10 MG tablet Take 6 tablets (60 mg total) by mouth daily with breakfast. Then start taper pack. (Patient not taking: Reported on 09/23/2021) 42 tablet 0   predniSONE (STERAPRED UNI-PAK 21 TAB) 10 MG (21) TBPK tablet 6 tablets (60 mg total) by mouth first day, 5 tablets second day, 4 tablets third day, 3 tablets fourth day, 2 fifth day and 1 tablet last day for a total of 21 tablets (Patient not taking: Reported on 10/07/2021) 21 tablet 0   prochlorperazine (COMPAZINE) 10 MG tablet Take 1 tablet (10 mg total) by mouth every 6 (six) hours as needed for nausea or vomiting. (Patient not taking: Reported on 09/23/2021) 60 tablet 2   No current facility-administered medications for this visit.   Facility-Administered Medications Ordered in Other Visits  Medication Dose Route  Frequency Provider Last Rate Last Admin   heparin lock flush 100 UNIT/ML injection            heparin lock flush 100 unit/mL  500 Units Intravenous Once Lloyd Huger, MD       sodium chloride flush (NS) 0.9 % injection 10 mL  10 mL Intravenous PRN Lloyd Huger, MD   10 mL at 01/06/21 1305    OBJECTIVE: Vitals:   11/12/21 1035  BP: (!) 118/59  Pulse: 69  Resp: 18  Temp: (!) 96.9 F (36.1 C)  SpO2: 99%      There is no height or weight on file to calculate BMI.    ECOG FS:0 - Asymptomatic  General: Well-developed, well-nourished, no acute distress. Eyes: Pink conjunctiva, anicteric sclera. HEENT: Normocephalic, moist mucous membranes.  Trach in place, no palpable lymphadenopathy. Lungs: No audible wheezing or coughing. Heart: Regular rate and rhythm. Abdomen: Soft, nontender, no obvious distention. Musculoskeletal: No edema,  cyanosis, or clubbing. Neuro: Alert, answering all questions appropriately. Cranial nerves grossly intact. Skin: No rashes or petechiae noted. Psych: Normal affect.   LAB RESULTS:  Lab Results  Component Value Date   NA 142 11/12/2021   K 4.3 11/12/2021   CL 110 11/12/2021   CO2 24 11/12/2021   GLUCOSE 90 11/12/2021   BUN 28 (H) 11/12/2021   CREATININE 1.66 (H) 11/12/2021   CALCIUM 9.2 11/12/2021   PROT 6.6 11/05/2021   ALBUMIN 3.6 11/05/2021   AST 19 11/05/2021   ALT 19 11/05/2021   ALKPHOS 42 11/05/2021   BILITOT 0.4 11/05/2021   GFRNONAA 45 (L) 11/12/2021   GFRAA 37 (L) 11/13/2019    Lab Results  Component Value Date   WBC 6.8 11/05/2021   NEUTROABS 5.3 11/05/2021   HGB 10.3 (L) 11/05/2021   HCT 31.6 (L) 11/05/2021   MCV 98.8 11/05/2021   PLT 220 11/05/2021     STUDIES: NM PET Image Restag (PS) Skull Base To Thigh  Result Date: 11/11/2021 CLINICAL DATA:  Subsequent treatment strategy for malignant neoplasm of supraglottis. Chemotherapy last week. EXAM: NUCLEAR MEDICINE PET SKULL BASE TO THIGH TECHNIQUE: 11.6 mCi  F-18 FDG was injected intravenously. Full-ring PET imaging was performed from the skull base to thigh after the radiotracer. CT data was obtained and used for attenuation correction and anatomic localization. Fasting blood glucose: 94 mg/dl COMPARISON:  03/23/2021 PET.  Neck CT of 07/28/2021. FINDINGS: Mediastinal blood pool activity: SUV max 2.1 Liver activity: SUV max NA NECK: Right-sided hypermetabolic cervical nodes, including a level 2 node of 7 mm and a S.U.V. max of 6.9 on 43/2. Hypermetabolism corresponding to the infiltrative right supraglottic recurrence, including at on the order of 4.4 x 3.5 cm and a S.U.V. max of 11.4 on 54/2. Right supraclavicular node measures 8 mm and a S.U.V. max of 4.2 on 71/2. Incidental CT findings: Tracheostomy.  Carotid atherosclerosis. CHEST: Hypermetabolic mediastinal nodes, including a right paratracheal node of 1.4 cm and a S.U.V. max of 7.6 on 80/2. Right suprahilar nodal hypermetabolism at a S.U.V. max of 8.4 on approximately image 91/2. Anterior right upper lobe pleural-based nodule measures 1.0 cm and a S.U.V. max of 2.9 on 96/2. A subtle right lower lobe pulmonary nodule centrally measures 8 mm and a S.U.V. max of 2.0 on 115/2. Medial left pleural hypermetabolism is favored to correspond to developing pleural thickening. Example a S.U.V. max of 3.7 on 121/2. Incidental CT findings: Median sternotomy for CABG. Right Port-A-Cath tip high right atrium. Cardiomegaly. Aortic atherosclerosis. ABDOMEN/PELVIS: No abdominopelvic nodal hypermetabolism. Diffuse proximal gastric hypermetabolism without significant wall thickening. Example a S.U.V. max of 4.8. Incidental CT findings: Normal adrenal glands. Small dependent gallstones. Normal noncontrast appearance of the kidneys, pancreas, urinary bladder, prostate. Tiny bilateral fat containing inguinal hernias. Scattered colonic diverticula. SKELETON: No abnormal marrow activity. Incidental CT findings: none IMPRESSION: 1.  Hypermetabolic recurrence within the right supraglottic region with ipsilateral cervical and thoracic nodal metastasis. 2. Right sided pulmonary parenchymal nodal metastasis. Suspect minimal left pleural metastasis without pleural fluid. 3. No subdiaphragmatic hypermetabolic metastasis identified. 4. Proximal gastric hypermetabolism could represent gastritis. 5. Incidental findings, including: Cholelithiasis. Aortic Atherosclerosis (ICD10-I70.0). Electronically Signed   By: Abigail Miyamoto M.D.   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  PLAN:    1.  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  Case discussed with [facility] head and neck physician, Dr. Ernst Bowler, and consensus was to restart chemotherapy with carboplatinum, Taxol, and Keytruda on a 21-day cycle.  Repeat PET scan reviewed independently and report as above reveals persistent malignancy.  Patient will require some sort of maintenance treatment more than just single agent Keytruda.  Possibly Keytruda and low-dose Taxol or Keytruda plus lenvatinib.  Return to clinic in 2 weeks for consideration of cycle 5, day 1 of treatment.  After conclusion of cycle 5, patient has consultation with Dr. Harriette Ohara at which point we will plan on next steps in treatment.      2.  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  PET scan results from November 12, 2019 reviewed independently with no obvious evidence of recurrent or progressive disease.  Level 2 lymph node is no longer hypermetabolic.  3.  Renal insufficiency: Patient's creatinine slightly elevated at 1.66, monitor. 4.  Anemia: Chronic and unchanged.  Patient's most recent hemoglobin is 10.3. 5.  Hypokalemia: Resolved. 6.   Pulmonary embolism: Diagnosed in September 2022.  Continue Eliquis as prescribed.  Patient will require a minimum of 6 months of treatment. 7.  Leukopenia: Resolved. 8.  Peripheral neuropathy: Chronic and unchanged.  Possibly related to Taxol.  Monitor closely now but Taxol is being reinitiated. 9.  Trach: Patient was evaluated yesterday and was determined not to remove his trach.  He reports he does not have follow-up back with ENT for several months. 10.  Rash: Resolved with steroid taper.  Patient expressed understanding and was in agreement with this plan. He also understands that He can call clinic at any time with any questions, concerns, or complaints.    Cancer Staging  Malignant neoplasm of supraglottis [facility]) Staging form: Larynx - Supraglottis, AJCC 8th Edition - Clinical stage from 12/11/2020: Stage IVC (cT3, cN2b, cM1) - Signed by Lloyd Huger, MD on 01/26/2021  Primary squamous cell carcinoma of tonsil ([place]) Staging form: Pharynx - HPV-Mediated Oropharynx, AJCC 8th Edition - Clinical: No stage assigned - Unsigned   Lloyd Huger, MD   11/13/2021 12:52 PM

## 2021-11-13 ENCOUNTER — Encounter: Payer: Self-pay | Admitting: Oncology

## 2021-11-14 NOTE — Progress Notes (Unsigned)
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  INTERVAL HISTORY: Patient returns to clinic today for further evaluation, discussion of his PET scan results, and treatment planning.  He had an appointment with [facility]NT yesterday and is disappointed because plans to remove his trach did not occur.  He otherwise feels well. He is tolerating his treatments without significant side effects. He has a chronic peripheral neuropathy that is unchanged.  He has no other neurologic complaints.  He denies any pain today.  He denies any recent fevers or illnesses. He has no chest pain, shortness of breath, cough, or hemoptysis.  He denies any nausea, vomiting, constipation, or diarrhea.  He has no urinary complaints.  Patient offers no further specific complaints today.  REVIEW OF SYSTEMS:   Review of Systems  Constitutional: Negative.  Negative for fever, malaise/fatigue and weight loss.  Respiratory: Negative.  Negative for cough, hemoptysis and shortness of breath.   Cardiovascular: Negative.  Negative for chest pain and leg swelling.  Gastrointestinal: Negative.  Negative for abdominal pain.  Genitourinary: Negative.  Negative for dysuria.  Musculoskeletal: Negative.  Negative for neck pain.  Skin: Negative.  Negative for itching and rash.  Neurological:  Positive for tingling. Negative for dizziness, focal weakness, weakness and headaches.  Psychiatric/Behavioral: Negative.  The patient is not nervous/anxious.     As per HPI. Otherwise, a complete review of systems is  negative.  PAST MEDICAL HISTORY: Past Medical History:  Diagnosis Date   Cancer [facility])    Coronary artery disease    Hypertension     PAST SURGICAL HISTORY: Past Surgical History:  Procedure Laterality Date   CORONARY ARTERY BYPASS GRAFT  2010   Quadruple   DIRECT LARYNGOSCOPY  11/28/2020   Procedure: DIRECT LARYNGOSCOPY WITH BIOPSY;  Surgeon: Beverly Gust, MD;  Location: [facility] ORS;  Service: ENT;;   IR IMAGING GUIDED PORT INSERTION  01/01/2021   PULMONARY THROMBECTOMY Bilateral 01/13/2021   Procedure: PULMONARY THROMBECTOMY;  Surgeon: Katha Cabal, MD;  Location: [place] [facility];  Service: Cardiovascular;  Laterality: Bilateral;   TRACHEOSTOMY TUBE PLACEMENT N/A 11/28/2020   Procedure: AWAKE TRACHEOSTOMY;  Surgeon: Beverly Gust, MD;  Location: [facility] ORS;  Service: ENT;  Laterality: N/A;    FAMILY HISTORY: Family History  Problem Relation Age of Onset   Arthritis Mother    Heart attack Father    Brain cancer Sister     ADVANCED DIRECTIVES (Y/N):  N  HEALTH MAINTENANCE: Social History   Tobacco Use   Smoking status: Former    Types: Cigarettes    Quit date: 10/17/2008    Years since quitting: 13.0   Smokeless tobacco: Never  Vaping Use   Vaping Use: Never used  Substance Use Topics   Alcohol use: Yes    Comment: occasionally   Drug use: Never     Colonoscopy:  PAP:  Bone density:  Lipid panel:  No Known Allergies  Current Outpatient Medications  Medication Sig Dispense Refill   ALPRAZolam (XANAX) 0.5 MG tablet Take 1 tablet (0.5 mg total) by mouth daily as needed for anxiety (Take 1 hour prior to radiation). (Patient not taking:  Reported on 04/14/2021) 30 tablet 1   amLODipine (NORVASC) 10 MG tablet Take 10 mg by mouth daily.     apixaban (ELIQUIS) 5 MG TABS tablet Take 1 tablet (5 mg total) by mouth 2 (two) times daily. 60 tablet 3   ascorbic acid (VITAMIN C) 500 MG tablet Take by mouth.     aspirin 81 MG chewable tablet Chew by mouth.      betamethasone valerate ointment (VALISONE) 0.1 % Apply 1 application. topically 2 (two) times daily. For up to 2 weeks (Patient not taking: Reported on 09/23/2021) 30 g 0   cyanocobalamin 1000 MCG tablet Take by mouth.     DENTA 5000 PLUS 1.1 % CREA dental cream      fluticasone (FLONASE) 50 MCG/ACT nasal spray Place 1 spray into both nostrils daily. (Patient not taking: Reported on 10/14/2021) 15.8 mL 0   gentamicin ointment (GARAMYCIN) 0.1 % Apply topically.     hydrALAZINE (APRESOLINE) 50 MG tablet Take 50 mg by mouth in the morning and at bedtime. 1.5 tab BID     ipratropium-albuterol (DUONEB) 0.5-2.5 (3) MG/3ML SOLN Take 3 mLs by nebulization every 6 (six) hours. (Patient not taking: Reported on 03/24/2021) 360 mL 0   metoprolol succinate (TOPROL-XL) 25 MG 24 hr tablet Take 25 mg by mouth daily.     olmesartan (BENICAR) 40 MG tablet Take 40 mg by mouth daily.     ondansetron (ZOFRAN) 8 MG tablet Take 1 tablet (8 mg total) by mouth every 8 (eight) hours as needed for nausea or vomiting. (Patient not taking: Reported on 04/14/2021) 60 tablet 2   predniSONE (DELTASONE) 10 MG tablet Take 6 tablets (60 mg total) by mouth daily with breakfast. Then start taper pack. (Patient not taking: Reported on 09/23/2021) 42 tablet 0   predniSONE (STERAPRED UNI-PAK 21 TAB) 10 MG (21) TBPK tablet 6 tablets (60 mg total) by mouth first day, 5 tablets second day, 4 tablets third day, 3 tablets fourth day, 2 fifth day and 1 tablet last day for a total of 21 tablets (Patient not taking: Reported on 10/07/2021) 21 tablet 0   prochlorperazine (COMPAZINE) 10 MG tablet Take 1 tablet (10 mg total) by mouth every 6 (six) hours as needed for nausea or vomiting. (Patient not taking: Reported on 09/23/2021) 60 tablet 2   rosuvastatin (CRESTOR) 40 MG tablet Take 40 mg by mouth daily.     No current facility-administered medications for this visit.   Facility-Administered Medications Ordered in Other Visits  Medication Dose Route  Frequency Provider Last Rate Last Admin   heparin lock flush 100 UNIT/ML injection            heparin lock flush 100 unit/mL  500 Units Intravenous Once Lloyd Huger, MD       sodium chloride flush (NS) 0.9 % injection 10 mL  10 mL Intravenous PRN Lloyd Huger, MD   10 mL at 01/06/21 1305    OBJECTIVE: There were no vitals filed for this visit.     There is no height or weight on file to calculate BMI.    ECOG FS:0 - Asymptomatic  General: Well-developed, well-nourished, no acute distress. Eyes: Pink conjunctiva, anicteric sclera. HEENT: Normocephalic, moist mucous membranes.  Trach in place, no palpable lymphadenopathy. Lungs: No audible wheezing or coughing. Heart: Regular rate and rhythm. Abdomen: Soft, nontender, no obvious distention. Musculoskeletal: No edema, cyanosis, or clubbing. Neuro: Alert, answering all questions appropriately. Cranial nerves grossly intact. Skin: No rashes or petechiae  noted. Psych: Normal affect.   LAB RESULTS:  Lab Results  Component Value Date   NA 142 11/12/2021   K 4.3 11/12/2021   CL 110 11/12/2021   CO2 24 11/12/2021   GLUCOSE 90 11/12/2021   BUN 28 (H) 11/12/2021   CREATININE 1.66 (H) 11/12/2021   CALCIUM 9.2 11/12/2021   PROT 6.6 11/05/2021   ALBUMIN 3.6 11/05/2021   AST 19 11/05/2021   ALT 19 11/05/2021   ALKPHOS 42 11/05/2021   BILITOT 0.4 11/05/2021   GFRNONAA 45 (L) 11/12/2021   GFRAA 37 (L) 11/13/2019    Lab Results  Component Value Date   WBC 6.8 11/05/2021   NEUTROABS 5.3 11/05/2021   HGB 10.3 (L) 11/05/2021   HCT 31.6 (L) 11/05/2021   MCV 98.8 11/05/2021   PLT 220 11/05/2021     STUDIES: NM PET Image Restag (PS) Skull Base To Thigh  Result Date: 11/11/2021 CLINICAL DATA:  Subsequent treatment strategy for malignant neoplasm of supraglottis. Chemotherapy last week. EXAM: NUCLEAR MEDICINE PET SKULL BASE TO THIGH TECHNIQUE: 11.6 mCi F-18 FDG was injected intravenously. Full-ring PET imaging was  performed from the skull base to thigh after the radiotracer. CT data was obtained and used for attenuation correction and anatomic localization. Fasting blood glucose: 94 mg/dl COMPARISON:  03/23/2021 PET.  Neck CT of 07/28/2021. FINDINGS: Mediastinal blood pool activity: SUV max 2.1 Liver activity: SUV max NA NECK: Right-sided hypermetabolic cervical nodes, including a level 2 node of 7 mm and a S.U.V. max of 6.9 on 43/2. Hypermetabolism corresponding to the infiltrative right supraglottic recurrence, including at on the order of 4.4 x 3.5 cm and a S.U.V. max of 11.4 on 54/2. Right supraclavicular node measures 8 mm and a S.U.V. max of 4.2 on 71/2. Incidental CT findings: Tracheostomy.  Carotid atherosclerosis. CHEST: Hypermetabolic mediastinal nodes, including a right paratracheal node of 1.4 cm and a S.U.V. max of 7.6 on 80/2. Right suprahilar nodal hypermetabolism at a S.U.V. max of 8.4 on approximately image 91/2. Anterior right upper lobe pleural-based nodule measures 1.0 cm and a S.U.V. max of 2.9 on 96/2. A subtle right lower lobe pulmonary nodule centrally measures 8 mm and a S.U.V. max of 2.0 on 115/2. Medial left pleural hypermetabolism is favored to correspond to developing pleural thickening. Example a S.U.V. max of 3.7 on 121/2. Incidental CT findings: Median sternotomy for CABG. Right Port-A-Cath tip high right atrium. Cardiomegaly. Aortic atherosclerosis. ABDOMEN/PELVIS: No abdominopelvic nodal hypermetabolism. Diffuse proximal gastric hypermetabolism without significant wall thickening. Example a S.U.V. max of 4.8. Incidental CT findings: Normal adrenal glands. Small dependent gallstones. Normal noncontrast appearance of the kidneys, pancreas, urinary bladder, prostate. Tiny bilateral fat containing inguinal hernias. Scattered colonic diverticula. SKELETON: No abnormal marrow activity. Incidental CT findings: none IMPRESSION: 1. Hypermetabolic recurrence within the right supraglottic region with  ipsilateral cervical and thoracic nodal metastasis. 2. Right sided pulmonary parenchymal nodal metastasis. Suspect minimal left pleural metastasis without pleural fluid. 3. No subdiaphragmatic hypermetabolic metastasis identified. 4. Proximal gastric hypermetabolism could represent gastritis. 5. Incidental findings, including: Cholelithiasis. Aortic Atherosclerosis (ICD10-I70.0). Electronically Signed   By: Abigail Miyamoto M.D.   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  PLAN:    1.  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  Case discussed with [facility] head and neck physician, Dr. Ernst Bowler, and consensus  was to restart chemotherapy with carboplatinum, Taxol, and Keytruda on a 21-day cycle.  Repeat PET scan reviewed independently and report as above reveals persistent malignancy.  Patient will require some sort of maintenance treatment more than just single agent Keytruda.  Possibly Keytruda and low-dose Taxol or Keytruda plus lenvatinib.  Return to clinic in 2 weeks for consideration of cycle 5, day 1 of treatment.  After conclusion of cycle 5, patient has consultation with Dr. Harriette Ohara at which point we will plan on next steps in treatment.      2.  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  PET scan results from November 12, 2019 reviewed independently with no obvious evidence of recurrent or progressive disease.  Level 2 lymph node is no longer hypermetabolic.  3.  Renal insufficiency: Patient's creatinine slightly elevated at 1.66, monitor. 4.  Anemia: Chronic and unchanged.  Patient's most recent hemoglobin is 10.3. 5.  Hypokalemia: Resolved. 6.  Pulmonary embolism: Diagnosed in September 2022.  Continue Eliquis  as prescribed.  Patient will require a minimum of 6 months of treatment. 7.  Leukopenia: Resolved. 8.  Peripheral neuropathy: Chronic and unchanged.  Possibly related to Taxol.  Monitor closely now but Taxol is being reinitiated. 9.  Trach: Patient was evaluated yesterday and was determined not to remove his trach.  He reports he does not have follow-up back with ENT for several months. 10.  Rash: Resolved with steroid taper.  Patient expressed understanding and was in agreement with this plan. He also understands that He can call clinic at any time with any questions, concerns, or complaints.    Cancer Staging  Malignant neoplasm of supraglottis [facility]) Staging form: Larynx - Supraglottis, AJCC 8th Edition - Clinical stage from 12/11/2020: Stage IVC (cT3, cN2b, cM1) - Signed by Lloyd Huger, MD on 01/26/2021  Primary squamous cell carcinoma of tonsil ([place]) Staging form: Pharynx - HPV-Mediated Oropharynx, AJCC 8th Edition - Clinical: No stage assigned - Unsigned   Lloyd Huger, MD   11/14/2021 7:40 AM

## 2021-11-18 ENCOUNTER — Ambulatory Visit: Payer: Medicare Other

## 2021-11-18 ENCOUNTER — Inpatient Hospital Stay: Payer: Medicare Other | Attending: Oncology

## 2021-11-18 ENCOUNTER — Encounter: Payer: Self-pay | Admitting: Oncology

## 2021-11-18 ENCOUNTER — Inpatient Hospital Stay: Payer: Medicare Other

## 2021-11-18 ENCOUNTER — Inpatient Hospital Stay (HOSPITAL_BASED_OUTPATIENT_CLINIC_OR_DEPARTMENT_OTHER): Payer: Medicare Other | Admitting: Oncology

## 2021-11-18 ENCOUNTER — Ambulatory Visit: Payer: Medicare Other | Admitting: Oncology

## 2021-11-18 ENCOUNTER — Other Ambulatory Visit: Payer: Medicare Other

## 2021-11-18 VITALS — BP 118/58 | HR 65 | Temp 98.7°F | Resp 20 | Wt 215.3 lb

## 2021-11-18 DIAGNOSIS — C321 Malignant neoplasm of supraglottis: Secondary | ICD-10-CM | POA: Insufficient documentation

## 2021-11-18 DIAGNOSIS — Z79899 Other long term (current) drug therapy: Secondary | ICD-10-CM | POA: Insufficient documentation

## 2021-11-18 DIAGNOSIS — G629 Polyneuropathy, unspecified: Secondary | ICD-10-CM | POA: Diagnosis not present

## 2021-11-18 DIAGNOSIS — Z87891 Personal history of nicotine dependence: Secondary | ICD-10-CM | POA: Insufficient documentation

## 2021-11-18 DIAGNOSIS — I10 Essential (primary) hypertension: Secondary | ICD-10-CM | POA: Diagnosis not present

## 2021-11-18 DIAGNOSIS — Z930 Tracheostomy status: Secondary | ICD-10-CM | POA: Insufficient documentation

## 2021-11-18 DIAGNOSIS — D649 Anemia, unspecified: Secondary | ICD-10-CM | POA: Insufficient documentation

## 2021-11-18 DIAGNOSIS — I2699 Other pulmonary embolism without acute cor pulmonale: Secondary | ICD-10-CM | POA: Diagnosis not present

## 2021-11-18 DIAGNOSIS — Z5112 Encounter for antineoplastic immunotherapy: Secondary | ICD-10-CM | POA: Diagnosis present

## 2021-11-18 DIAGNOSIS — Z5111 Encounter for antineoplastic chemotherapy: Secondary | ICD-10-CM | POA: Insufficient documentation

## 2021-11-18 DIAGNOSIS — Z7901 Long term (current) use of anticoagulants: Secondary | ICD-10-CM | POA: Diagnosis not present

## 2021-11-18 DIAGNOSIS — C099 Malignant neoplasm of tonsil, unspecified: Secondary | ICD-10-CM

## 2021-11-18 LAB — CBC WITH DIFFERENTIAL/PLATELET
Abs Immature Granulocytes: 0.03 10*3/uL (ref 0.00–0.07)
Basophils Absolute: 0 10*3/uL (ref 0.0–0.1)
Basophils Relative: 1 %
Eosinophils Absolute: 0.1 10*3/uL (ref 0.0–0.5)
Eosinophils Relative: 2 %
HCT: 31.3 % — ABNORMAL LOW (ref 39.0–52.0)
Hemoglobin: 10.2 g/dL — ABNORMAL LOW (ref 13.0–17.0)
Immature Granulocytes: 1 %
Lymphocytes Relative: 19 %
Lymphs Abs: 0.8 10*3/uL (ref 0.7–4.0)
MCH: 32.5 pg (ref 26.0–34.0)
MCHC: 32.6 g/dL (ref 30.0–36.0)
MCV: 99.7 fL (ref 80.0–100.0)
Monocytes Absolute: 0.7 10*3/uL (ref 0.1–1.0)
Monocytes Relative: 18 %
Neutro Abs: 2.4 10*3/uL (ref 1.7–7.7)
Neutrophils Relative %: 59 %
Platelets: 186 10*3/uL (ref 150–400)
RBC: 3.14 MIL/uL — ABNORMAL LOW (ref 4.22–5.81)
RDW: 15.6 % — ABNORMAL HIGH (ref 11.5–15.5)
WBC: 4 10*3/uL (ref 4.0–10.5)
nRBC: 0 % (ref 0.0–0.2)

## 2021-11-18 LAB — COMPREHENSIVE METABOLIC PANEL
ALT: 18 U/L (ref 0–44)
AST: 20 U/L (ref 15–41)
Albumin: 3.7 g/dL (ref 3.5–5.0)
Alkaline Phosphatase: 41 U/L (ref 38–126)
Anion gap: 6 (ref 5–15)
BUN: 19 mg/dL (ref 8–23)
CO2: 25 mmol/L (ref 22–32)
Calcium: 8.8 mg/dL — ABNORMAL LOW (ref 8.9–10.3)
Chloride: 108 mmol/L (ref 98–111)
Creatinine, Ser: 1.33 mg/dL — ABNORMAL HIGH (ref 0.61–1.24)
GFR, Estimated: 59 mL/min — ABNORMAL LOW (ref >60)
Glucose, Bld: 119 mg/dL — ABNORMAL HIGH (ref 70–99)
Potassium: 3.9 mmol/L (ref 3.5–5.1)
Sodium: 139 mmol/L (ref 135–145)
Total Bilirubin: 0.4 mg/dL (ref 0.3–1.2)
Total Protein: 6.7 g/dL (ref 6.5–8.1)

## 2021-11-18 MED ORDER — SODIUM CHLORIDE 0.9 % IV SOLN
Freq: Once | INTRAVENOUS | Status: AC
  Filled 2021-11-18: qty 250

## 2021-11-18 MED ORDER — HEPARIN SOD (PORK) LOCK FLUSH 100 UNIT/ML IV SOLN
INTRAVENOUS | Status: AC
  Filled 2021-11-18: qty 5

## 2021-11-18 MED ORDER — HEPARIN SOD (PORK) LOCK FLUSH 100 UNIT/ML IV SOLN
500.0000 [IU] | Freq: Once | INTRAVENOUS | Status: AC | PRN
  Administered 2021-11-18: 500 [IU]
  Filled 2021-11-18: qty 5

## 2021-11-18 MED ORDER — SODIUM CHLORIDE 0.9% FLUSH
10.0000 mL | Freq: Once | INTRAVENOUS | Status: AC
  Administered 2021-11-18: 10 mL via INTRAVENOUS
  Filled 2021-11-18: qty 10

## 2021-11-18 MED ORDER — PROCHLORPERAZINE MALEATE 10 MG PO TABS
10.0000 mg | ORAL_TABLET | Freq: Once | ORAL | Status: AC
  Administered 2021-11-18: 10 mg via ORAL
  Filled 2021-11-18: qty 1

## 2021-11-18 MED ORDER — SODIUM CHLORIDE 0.9 % IV SOLN
200.0000 mg | Freq: Once | INTRAVENOUS | Status: AC
  Administered 2021-11-18: 200 mg via INTRAVENOUS
  Filled 2021-11-18: qty 8

## 2021-11-18 NOTE — Patient Instructions (Signed)
[facility]  Discharge Instructions: Thank you for choosing [facility] to provide your oncology and hematology care.  If you have a lab appointment with the Cancer Center, please go directly to the Cancer Center and check in at the registration area.  Wear comfortable clothing and clothing appropriate for easy access to any Portacath or PICC line.   We strive to give you quality time with your provider. You may need to reschedule your appointment if you arrive late (15 or more minutes).  Arriving late affects you and other patients whose appointments are after yours.  Also, if you miss three or more appointments without notifying the office, you may be dismissed from the clinic at the provider's discretion.      For prescription refill requests, have your pharmacy contact our office and allow 72 hours for refills to be completed.    Today you received the following chemotherapy and/or immunotherapy agents keytruda    To help prevent nausea and vomiting after your treatment, we encourage you to take your nausea medication as directed.  BELOW ARE SYMPTOMS THAT SHOULD BE REPORTED IMMEDIATELY: *FEVER GREATER THAN 100.4 F (38 C) OR HIGHER *CHILLS OR SWEATING *NAUSEA AND VOMITING THAT IS NOT CONTROLLED WITH YOUR NAUSEA MEDICATION *UNUSUAL SHORTNESS OF BREATH *UNUSUAL BRUISING OR BLEEDING *URINARY PROBLEMS (pain or burning when urinating, or frequent urination) *BOWEL PROBLEMS (unusual diarrhea, constipation, pain near the anus) TENDERNESS IN MOUTH AND THROAT WITH OR WITHOUT PRESENCE OF ULCERS (sore throat, sores in mouth, or a toothache) UNUSUAL RASH, SWELLING OR PAIN  UNUSUAL VAGINAL DISCHARGE OR ITCHING   Items with * indicate a potential emergency and should be followed up as soon as possible or go to the Emergency Department if any problems should occur.  Please show the CHEMOTHERAPY ALERT CARD or IMMUNOTHERAPY ALERT CARD at check-in to the  Emergency Department and triage nurse.  Should you have questions after your visit or need to cancel or reschedule your appointment, please contact [facility]  [phone] and follow the prompts.  Office hours are 8:00 a.m. to 4:30 p.m. Monday - Friday. Please note that voicemails left after 4:00 p.m. may not be returned until the following business day.  We are closed weekends and major holidays. You have access to a nurse at all times for urgent questions. Please call the main number to the clinic [phone] and follow the prompts.  For any non-urgent questions, you may also contact your provider using MyChart. We now offer e-Visits for anyone 18 and older to request care online for non-urgent symptoms. For details visit [email or web address].   Also download the MyChart app! Go to the app store, search "MyChart", open the app, select [facility], and log in with your MyChart username and password.  Masks are optional in the cancer centers. If you would like for your care team to wear a mask while they are taking care of you, please let them know. For doctor visits, patients may have with them one support person who is at least 66 years old. At this time, visitors are not allowed in the infusion area.   

## 2021-11-19 ENCOUNTER — Encounter: Payer: Self-pay | Admitting: Oncology

## 2021-11-19 NOTE — Progress Notes (Signed)
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  INTERVAL HISTORY: Patient returns to clinic today for further evaluation and consideration of cycle 5, day 8 of Keytruda, carboplatinum, and Taxol.  Carboplatinum and Taxol only.  He continues to have a mild peripheral neuropathy, but otherwise is tolerating his treatments well.  He has no other neurologic complaints.  He denies any pain today.  He denies any recent fevers or illnesses. He has no chest pain, shortness of breath, cough, or hemoptysis.  He denies any nausea, vomiting, constipation, or diarrhea.  He has no urinary complaints.  Patient offers no further specific complaints today.  REVIEW OF SYSTEMS:   Review of Systems  Constitutional: Negative.  Negative for fever, malaise/fatigue and weight loss.  Respiratory: Negative.  Negative for cough, hemoptysis and shortness of breath.   Cardiovascular: Negative.  Negative for chest pain and leg swelling.  Gastrointestinal: Negative.  Negative for abdominal pain.  Genitourinary: Negative.  Negative for dysuria.  Musculoskeletal: Negative.  Negative for neck pain.  Skin: Negative.  Negative for itching and rash.  Neurological:  Positive for tingling. Negative for dizziness, focal weakness, weakness and headaches.  Psychiatric/Behavioral: Negative.  The patient is not nervous/anxious.     As per HPI. Otherwise, a complete review of systems is negative.  PAST MEDICAL HISTORY: Past Medical History:  Diagnosis Date   Cancer [facility])    Coronary artery disease     Hypertension     PAST SURGICAL HISTORY: Past Surgical History:  Procedure Laterality Date   CORONARY ARTERY BYPASS GRAFT  2010   Quadruple   DIRECT LARYNGOSCOPY  11/28/2020   Procedure: DIRECT LARYNGOSCOPY WITH BIOPSY;  Surgeon: Beverly Gust, MD;  Location: [facility] ORS;  Service: ENT;;   IR IMAGING GUIDED PORT INSERTION  01/01/2021   PULMONARY THROMBECTOMY Bilateral 01/13/2021   Procedure: PULMONARY THROMBECTOMY;  Surgeon: Katha Cabal, MD;  Location: [place] CV LAB;  Service: Cardiovascular;  Laterality: Bilateral;   TRACHEOSTOMY TUBE PLACEMENT N/A 11/28/2020   Procedure: AWAKE TRACHEOSTOMY;  Surgeon: Beverly Gust, MD;  Location: [facility] ORS;  Service: ENT;  Laterality: N/A;    FAMILY HISTORY: Family History  Problem Relation Age of Onset   Arthritis Mother    Heart attack Father    Brain cancer Sister     ADVANCED DIRECTIVES (Y/N):  N  HEALTH MAINTENANCE: Social History   Tobacco Use   Smoking status: Former    Types: Cigarettes    Quit date: 10/17/2008    Years since quitting: 13.1   Smokeless tobacco: Never  Vaping Use   Vaping Use: Never used  Substance Use Topics   Alcohol use: Yes    Comment: occasionally   Drug use: Never     Colonoscopy:  PAP:  Bone density:  Lipid panel:  No Known Allergies  Current Outpatient Medications  Medication Sig Dispense Refill   amLODipine (NORVASC) 10 MG tablet Take 10 mg by mouth daily.     apixaban (ELIQUIS) 5 MG TABS tablet Take 1 tablet (5 mg total) by mouth 2 (two) times daily. 60 tablet 3   ascorbic acid (VITAMIN C) 500 MG tablet Take by  mouth.     cyanocobalamin 1000 MCG tablet Take by mouth.     DENTA 5000 PLUS 1.1 % CREA dental cream      hydrALAZINE (APRESOLINE) 50 MG tablet Take 50 mg by mouth in the morning and at bedtime. 1.5 tab BID     metoprolol succinate (TOPROL-XL) 25 MG 24 hr tablet Take 25 mg by mouth daily.     olmesartan (BENICAR) 40 MG tablet Take 40 mg by mouth daily.     rosuvastatin  (CRESTOR) 40 MG tablet Take 40 mg by mouth daily.     ALPRAZolam (XANAX) 0.5 MG tablet Take 1 tablet (0.5 mg total) by mouth daily as needed for anxiety (Take 1 hour prior to radiation). (Patient not taking: Reported on 04/14/2021) 30 tablet 1   aspirin 81 MG chewable tablet Chew by mouth. (Patient not taking: Reported on 11/25/2021)     betamethasone valerate ointment (VALISONE) 0.1 % Apply 1 application. topically 2 (two) times daily. For up to 2 weeks (Patient not taking: Reported on 09/23/2021) 30 g 0   fluticasone (FLONASE) 50 MCG/ACT nasal spray Place 1 spray into both nostrils daily. (Patient not taking: Reported on 10/14/2021) 15.8 mL 0   gentamicin ointment (GARAMYCIN) 0.1 % Apply topically.     ipratropium-albuterol (DUONEB) 0.5-2.5 (3) MG/3ML SOLN Take 3 mLs by nebulization every 6 (six) hours. (Patient not taking: Reported on 03/24/2021) 360 mL 0   ondansetron (ZOFRAN) 8 MG tablet Take 1 tablet (8 mg total) by mouth every 8 (eight) hours as needed for nausea or vomiting. (Patient not taking: Reported on 04/14/2021) 60 tablet 2   predniSONE (DELTASONE) 10 MG tablet Take 6 tablets (60 mg total) by mouth daily with breakfast. Then start taper pack. (Patient not taking: Reported on 11/25/2021) 42 tablet 0   predniSONE (STERAPRED UNI-PAK 21 TAB) 10 MG (21) TBPK tablet 6 tablets (60 mg total) by mouth first day, 5 tablets second day, 4 tablets third day, 3 tablets fourth day, 2 fifth day and 1 tablet last day for a total of 21 tablets (Patient not taking: Reported on 10/07/2021) 21 tablet 0   prochlorperazine (COMPAZINE) 10 MG tablet Take 1 tablet (10 mg total) by mouth every 6 (six) hours as needed for nausea or vomiting. (Patient not taking: Reported on 09/23/2021) 60 tablet 2   No current facility-administered medications for this visit.   Facility-Administered Medications Ordered in Other Visits  Medication Dose Route Frequency Provider Last Rate Last Admin   diphenhydrAMINE (BENADRYL) 50 MG/ML  injection            famotidine (PEPCID) 20-0.9 MG/50ML-% IVPB            heparin lock flush 100 UNIT/ML injection            heparin lock flush 100 UNIT/ML injection            heparin lock flush 100 unit/mL  500 Units Intravenous Once Grayland Ormond, Kathlene November, MD       palonosetron (ALOXI) 0.25 MG/5ML injection            sodium chloride flush (NS) 0.9 % injection 10 mL  10 mL Intravenous PRN Lloyd Huger, MD   10 mL at 01/06/21 1305    OBJECTIVE: Vitals:   11/25/21 0845  BP: (!) 104/45  Pulse: 70  Resp: 18  Temp: 98 F (36.7 C)  SpO2: 100%      Body mass index is 31.14 kg/m.    ECOG FS:0 -  Asymptomatic  General: Well-developed, well-nourished, no acute distress. Eyes: Pink conjunctiva, anicteric sclera. HEENT: Normocephalic, moist mucous membranes.  Trach in place. Lungs: No audible wheezing or coughing. Heart: Regular rate and rhythm. Abdomen: Soft, nontender, no obvious distention. Musculoskeletal: No edema, cyanosis, or clubbing. Neuro: Alert, answering all questions appropriately. Cranial nerves grossly intact. Skin: No rashes or petechiae noted. Psych: Normal affect.   LAB RESULTS:  Lab Results  Component Value Date   NA 139 11/25/2021   K 3.6 11/25/2021   CL 104 11/25/2021   CO2 26 11/25/2021   GLUCOSE 117 (H) 11/25/2021   BUN 27 (H) 11/25/2021   CREATININE 1.49 (H) 11/25/2021   CALCIUM 9.2 11/25/2021   PROT 7.0 11/25/2021   ALBUMIN 3.9 11/25/2021   AST 22 11/25/2021   ALT 18 11/25/2021   ALKPHOS 40 11/25/2021   BILITOT 0.6 11/25/2021   GFRNONAA 52 (L) 11/25/2021   GFRAA 37 (L) 11/13/2019    Lab Results  Component Value Date   WBC 5.7 11/25/2021   NEUTROABS 3.6 11/25/2021   HGB 11.2 (L) 11/25/2021   HCT 34.3 (L) 11/25/2021   MCV 97.7 11/25/2021   PLT 203 11/25/2021     STUDIES: NM PET Image Restag (PS) Skull Base To Thigh  Result Date: 11/11/2021 CLINICAL DATA:  Subsequent treatment strategy for malignant neoplasm of supraglottis.  Chemotherapy last week. EXAM: NUCLEAR MEDICINE PET SKULL BASE TO THIGH TECHNIQUE: 11.6 mCi F-18 FDG was injected intravenously. Full-ring PET imaging was performed from the skull base to thigh after the radiotracer. CT data was obtained and used for attenuation correction and anatomic localization. Fasting blood glucose: 94 mg/dl COMPARISON:  03/23/2021 PET.  Neck CT of 07/28/2021. FINDINGS: Mediastinal blood pool activity: SUV max 2.1 Liver activity: SUV max NA NECK: Right-sided hypermetabolic cervical nodes, including a level 2 node of 7 mm and a S.U.V. max of 6.9 on 43/2. Hypermetabolism corresponding to the infiltrative right supraglottic recurrence, including at on the order of 4.4 x 3.5 cm and a S.U.V. max of 11.4 on 54/2. Right supraclavicular node measures 8 mm and a S.U.V. max of 4.2 on 71/2. Incidental CT findings: Tracheostomy.  Carotid atherosclerosis. CHEST: Hypermetabolic mediastinal nodes, including a right paratracheal node of 1.4 cm and a S.U.V. max of 7.6 on 80/2. Right suprahilar nodal hypermetabolism at a S.U.V. max of 8.4 on approximately image 91/2. Anterior right upper lobe pleural-based nodule measures 1.0 cm and a S.U.V. max of 2.9 on 96/2. A subtle right lower lobe pulmonary nodule centrally measures 8 mm and a S.U.V. max of 2.0 on 115/2. Medial left pleural hypermetabolism is favored to correspond to developing pleural thickening. Example a S.U.V. max of 3.7 on 121/2. Incidental CT findings: Median sternotomy for CABG. Right Port-A-Cath tip high right atrium. Cardiomegaly. Aortic atherosclerosis. ABDOMEN/PELVIS: No abdominopelvic nodal hypermetabolism. Diffuse proximal gastric hypermetabolism without significant wall thickening. Example a S.U.V. max of 4.8. Incidental CT findings: Normal adrenal glands. Small dependent gallstones. Normal noncontrast appearance of the kidneys, pancreas, urinary bladder, prostate. Tiny bilateral fat containing inguinal hernias. Scattered colonic diverticula.  SKELETON: No abnormal marrow activity. Incidental CT findings: none IMPRESSION: 1. Hypermetabolic recurrence within the right supraglottic region with ipsilateral cervical and thoracic nodal metastasis. 2. Right sided pulmonary parenchymal nodal metastasis. Suspect minimal left pleural metastasis without pleural fluid. 3. No subdiaphragmatic hypermetabolic metastasis identified. 4. Proximal gastric hypermetabolism could represent gastritis. 5. Incidental findings, including: Cholelithiasis. Aortic Atherosclerosis (ICD10-I70.0). Electronically Signed   By: Abigail Miyamoto M.D.   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  PLAN:    1.  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  Case discussed with [facility] head and neck physician, Dr. Ernst Bowler, and consensus was to restart chemotherapy with carboplatinum, Taxol, and Keytruda on a 21-day cycle.  Repeat PET scan reviewed independently and report as above reveals persistent malignancy.  Patient will require some sort of maintenance treatment more than just single agent Keytruda.  Possibly Keytruda plus lenvatinib.  Proceed with cycle 5, day 8 of treatment today, carboplatinum and Taxol only.  Patient has an appointment [facility] along with PET scan on December 15, 2021, therefore will return to clinic on December 16, 2021 for further evaluation and consideration of additional treatment.      2.  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  PET scan results from November 12, 2019 reviewed independently with no obvious evidence of recurrent or progressive disease.  Level 2 lymph node is no longer hypermetabolic.  3.  Renal insufficiency: Creatinine has trended up slightly to 1.49, monitor.    4.  Anemia: Hemoglobin mildly improved to 11.2. 5.  Hypokalemia: Resolved. 6.  Pulmonary embolism: Diagnosed in September 2022.  Continue Eliquis as prescribed.  Patient will require a minimum of 6 months of treatment. 7.  Leukopenia: Resolved. 8.  Peripheral neuropathy: Chronic and unchanged.  Possibly related to Taxol.  Monitor closely now but Taxol is being reinitiated. 9.  Trach: Patient was evaluated recently and it was determined not to remove his trach.  He reports he does not have follow-up back with ENT until October.   10.  Rash: Resolved with steroid taper.  Patient expressed understanding and was in agreement with this plan. He also understands that He can call clinic at any time with any questions, concerns, or complaints.    Cancer Staging  Malignant neoplasm of supraglottis [facility]) Staging form: Larynx - Supraglottis, AJCC 8th Edition - Clinical stage from 12/11/2020: Stage IVC (cT3, cN2b, cM1) - Signed by Lloyd Huger, MD on 01/26/2021  Primary squamous cell carcinoma of tonsil ([place]) Staging form: Pharynx - HPV-Mediated Oropharynx, AJCC 8th Edition - Clinical: No stage assigned - Unsigned   Lloyd Huger, MD   11/26/2021 6:47 AM

## 2021-11-24 MED FILL — Fosaprepitant Dimeglumine For IV Infusion 150 MG (Base Eq): INTRAVENOUS | Qty: 5 | Status: AC

## 2021-11-24 MED FILL — Dexamethasone Sodium Phosphate Inj 100 MG/10ML: INTRAMUSCULAR | Qty: 1 | Status: AC

## 2021-11-25 ENCOUNTER — Inpatient Hospital Stay: Payer: Medicare Other

## 2021-11-25 ENCOUNTER — Inpatient Hospital Stay (HOSPITAL_BASED_OUTPATIENT_CLINIC_OR_DEPARTMENT_OTHER): Payer: Medicare Other | Admitting: Oncology

## 2021-11-25 VITALS — BP 104/45 | HR 70 | Temp 98.0°F | Resp 18 | Wt 210.9 lb

## 2021-11-25 DIAGNOSIS — Z5112 Encounter for antineoplastic immunotherapy: Secondary | ICD-10-CM | POA: Diagnosis not present

## 2021-11-25 DIAGNOSIS — C321 Malignant neoplasm of supraglottis: Secondary | ICD-10-CM | POA: Diagnosis not present

## 2021-11-25 DIAGNOSIS — C099 Malignant neoplasm of tonsil, unspecified: Secondary | ICD-10-CM

## 2021-11-25 LAB — COMPREHENSIVE METABOLIC PANEL
ALT: 18 U/L (ref 0–44)
AST: 22 U/L (ref 15–41)
Albumin: 3.9 g/dL (ref 3.5–5.0)
Alkaline Phosphatase: 40 U/L (ref 38–126)
Anion gap: 9 (ref 5–15)
BUN: 27 mg/dL — ABNORMAL HIGH (ref 8–23)
CO2: 26 mmol/L (ref 22–32)
Calcium: 9.2 mg/dL (ref 8.9–10.3)
Chloride: 104 mmol/L (ref 98–111)
Creatinine, Ser: 1.49 mg/dL — ABNORMAL HIGH (ref 0.61–1.24)
GFR, Estimated: 52 mL/min — ABNORMAL LOW (ref >60)
Glucose, Bld: 117 mg/dL — ABNORMAL HIGH (ref 70–99)
Potassium: 3.6 mmol/L (ref 3.5–5.1)
Sodium: 139 mmol/L (ref 135–145)
Total Bilirubin: 0.6 mg/dL (ref 0.3–1.2)
Total Protein: 7 g/dL (ref 6.5–8.1)

## 2021-11-25 LAB — CBC WITH DIFFERENTIAL/PLATELET
Abs Immature Granulocytes: 0.04 10*3/uL (ref 0.00–0.07)
Basophils Absolute: 0 10*3/uL (ref 0.0–0.1)
Basophils Relative: 1 %
Eosinophils Absolute: 0.1 10*3/uL (ref 0.0–0.5)
Eosinophils Relative: 2 %
HCT: 34.3 % — ABNORMAL LOW (ref 39.0–52.0)
Hemoglobin: 11.2 g/dL — ABNORMAL LOW (ref 13.0–17.0)
Immature Granulocytes: 1 %
Lymphocytes Relative: 18 %
Lymphs Abs: 1.1 10*3/uL (ref 0.7–4.0)
MCH: 31.9 pg (ref 26.0–34.0)
MCHC: 32.7 g/dL (ref 30.0–36.0)
MCV: 97.7 fL (ref 80.0–100.0)
Monocytes Absolute: 0.9 10*3/uL (ref 0.1–1.0)
Monocytes Relative: 15 %
Neutro Abs: 3.6 10*3/uL (ref 1.7–7.7)
Neutrophils Relative %: 63 %
Platelets: 203 10*3/uL (ref 150–400)
RBC: 3.51 MIL/uL — ABNORMAL LOW (ref 4.22–5.81)
RDW: 14.9 % (ref 11.5–15.5)
WBC: 5.7 10*3/uL (ref 4.0–10.5)
nRBC: 0 % (ref 0.0–0.2)

## 2021-11-25 MED ORDER — DIPHENHYDRAMINE HCL 50 MG/ML IJ SOLN
25.0000 mg | Freq: Once | INTRAMUSCULAR | Status: AC
  Administered 2021-11-25: 25 mg via INTRAVENOUS

## 2021-11-25 MED ORDER — SODIUM CHLORIDE 0.9 % IV SOLN
150.0000 mg | Freq: Once | INTRAVENOUS | Status: AC
  Administered 2021-11-25: 150 mg via INTRAVENOUS
  Filled 2021-11-25: qty 150

## 2021-11-25 MED ORDER — SODIUM CHLORIDE 0.9 % IV SOLN
180.2000 mg | Freq: Once | INTRAVENOUS | Status: AC
  Administered 2021-11-25: 180 mg via INTRAVENOUS
  Filled 2021-11-25: qty 18

## 2021-11-25 MED ORDER — DIPHENHYDRAMINE HCL 50 MG/ML IJ SOLN
INTRAMUSCULAR | Status: AC
  Filled 2021-11-25: qty 1

## 2021-11-25 MED ORDER — SODIUM CHLORIDE 0.9 % IV SOLN
80.0000 mg/m2 | Freq: Once | INTRAVENOUS | Status: AC
  Administered 2021-11-25: 168 mg via INTRAVENOUS
  Filled 2021-11-25: qty 28

## 2021-11-25 MED ORDER — HEPARIN SOD (PORK) LOCK FLUSH 100 UNIT/ML IV SOLN
500.0000 [IU] | Freq: Once | INTRAVENOUS | Status: AC | PRN
  Administered 2021-11-25: 500 [IU]
  Filled 2021-11-25: qty 5

## 2021-11-25 MED ORDER — FAMOTIDINE IN NACL 20-0.9 MG/50ML-% IV SOLN
20.0000 mg | Freq: Once | INTRAVENOUS | Status: AC
  Administered 2021-11-25: 20 mg via INTRAVENOUS

## 2021-11-25 MED ORDER — SODIUM CHLORIDE 0.9 % IV SOLN
10.0000 mg | Freq: Once | INTRAVENOUS | Status: AC
  Administered 2021-11-25: 10 mg via INTRAVENOUS
  Filled 2021-11-25: qty 10

## 2021-11-25 MED ORDER — HEPARIN SOD (PORK) LOCK FLUSH 100 UNIT/ML IV SOLN
INTRAVENOUS | Status: AC
  Filled 2021-11-25: qty 5

## 2021-11-25 MED ORDER — FAMOTIDINE IN NACL 20-0.9 MG/50ML-% IV SOLN
INTRAVENOUS | Status: AC
  Filled 2021-11-25: qty 50

## 2021-11-25 MED ORDER — PALONOSETRON HCL INJECTION 0.25 MG/5ML
INTRAVENOUS | Status: AC
  Filled 2021-11-25: qty 5

## 2021-11-25 MED ORDER — PALONOSETRON HCL INJECTION 0.25 MG/5ML
0.2500 mg | Freq: Once | INTRAVENOUS | Status: AC
  Administered 2021-11-25: 0.25 mg via INTRAVENOUS

## 2021-11-25 MED ORDER — SODIUM CHLORIDE 0.9 % IV SOLN
Freq: Once | INTRAVENOUS | Status: AC
  Filled 2021-11-25: qty 250

## 2021-11-25 NOTE — Patient Instructions (Signed)
[facility]  Discharge Instructions: Thank you for choosing [facility] to provide your oncology and hematology care.  If you have a lab appointment with the Cancer Center, please go directly to the Cancer Center and check in at the registration area.  Wear comfortable clothing and clothing appropriate for easy access to any Portacath or PICC line.   We strive to give you quality time with your provider. You may need to reschedule your appointment if you arrive late (15 or more minutes).  Arriving late affects you and other patients whose appointments are after yours.  Also, if you miss three or more appointments without notifying the office, you may be dismissed from the clinic at the provider's discretion.      For prescription refill requests, have your pharmacy contact our office and allow 72 hours for refills to be completed.    Today you received the following chemotherapy and/or immunotherapy agents Taxol & carboplatin      To help prevent nausea and vomiting after your treatment, we encourage you to take your nausea medication as directed.  BELOW ARE SYMPTOMS THAT SHOULD BE REPORTED IMMEDIATELY: *FEVER GREATER THAN 100.4 F (38 C) OR HIGHER *CHILLS OR SWEATING *NAUSEA AND VOMITING THAT IS NOT CONTROLLED WITH YOUR NAUSEA MEDICATION *UNUSUAL SHORTNESS OF BREATH *UNUSUAL BRUISING OR BLEEDING *URINARY PROBLEMS (pain or burning when urinating, or frequent urination) *BOWEL PROBLEMS (unusual diarrhea, constipation, pain near the anus) TENDERNESS IN MOUTH AND THROAT WITH OR WITHOUT PRESENCE OF ULCERS (sore throat, sores in mouth, or a toothache) UNUSUAL RASH, SWELLING OR PAIN  UNUSUAL VAGINAL DISCHARGE OR ITCHING   Items with * indicate a potential emergency and should be followed up as soon as possible or go to the Emergency Department if any problems should occur.  Please show the CHEMOTHERAPY ALERT CARD or IMMUNOTHERAPY ALERT CARD at  check-in to the Emergency Department and triage nurse.  Should you have questions after your visit or need to cancel or reschedule your appointment, please contact [facility]  [phone] and follow the prompts.  Office hours are 8:00 a.m. to 4:30 p.m. Monday - Friday. Please note that voicemails left after 4:00 p.m. may not be returned until the following business day.  We are closed weekends and major holidays. You have access to a nurse at all times for urgent questions. Please call the main number to the clinic [phone] and follow the prompts.  For any non-urgent questions, you may also contact your provider using MyChart. We now offer e-Visits for anyone 18 and older to request care online for non-urgent symptoms. For details visit [email or web address].   Also download the MyChart app! Go to the app store, search "MyChart", open the app, select [facility], and log in with your MyChart username and password.  Masks are optional in the cancer centers. If you would like for your care team to wear a mask while they are taking care of you, please let them know. For doctor visits, patients may have with them one support person who is at least 66 years old. At this time, visitors are not allowed in the infusion area.   

## 2021-11-26 ENCOUNTER — Encounter: Payer: Self-pay | Admitting: Oncology

## 2021-12-04 ENCOUNTER — Other Ambulatory Visit: Payer: Self-pay | Admitting: Oncology

## 2021-12-04 DIAGNOSIS — C321 Malignant neoplasm of supraglottis: Secondary | ICD-10-CM

## 2021-12-04 NOTE — Progress Notes (Signed)
OFF PATHWAY REGIMEN - Head and Neck  No Change  Continue With Treatment as Ordered.  Original Decision Date/Time: 12/19/2020 16:13   [id number]:Carboplatin AUC=5 IV D1 + Paclitaxel 175 mg/m2 IV D1 + Pembrolizumab 200 mg IV D1 q21 Days:   A cycle is every 21 days:     Pembrolizumab      Paclitaxel      Carboplatin   **Always confirm dose/schedule in your pharmacy ordering system**  Patient Characteristics: Other Sites, Metastatic, First Line, No Prior Platinum-Based Chemoradiation within 6 Months, PD-L1 Expression Negative (CPS < 1) / Unknown Disease Classification: Other Sites AJCC 8 Stage Grouping: IVC Therapeutic Status: Metastatic Disease Line of Therapy: First Line Prior Platinum Status: No Prior Platinum-Based Chemoradiation within 6 Months PD-L1 Expression Status: Awaiting Test Results Intent of Therapy: Non-Curative / Palliative Intent, Discussed with Patient

## 2021-12-15 NOTE — Progress Notes (Deleted)
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  INTERVAL HISTORY: Patient returns to clinic today for further evaluation and consideration of cycle 5, day 8 of Keytruda, carboplatinum, and Taxol.  Carboplatinum and Taxol only.  He continues to have a mild peripheral neuropathy, but otherwise is tolerating his treatments well.  He has no other neurologic complaints.  He denies any pain today.  He denies any recent fevers or illnesses. He has no chest pain, shortness of breath, cough, or hemoptysis.  He denies any nausea, vomiting, constipation, or diarrhea.  He has no urinary complaints.  Patient offers no further specific complaints today.  REVIEW OF SYSTEMS:   Review of Systems  Constitutional: Negative.  Negative for fever, malaise/fatigue and weight loss.  Respiratory: Negative.  Negative for cough, hemoptysis and shortness of breath.   Cardiovascular: Negative.  Negative for chest pain and leg swelling.  Gastrointestinal: Negative.  Negative for abdominal pain.  Genitourinary: Negative.  Negative for dysuria.  Musculoskeletal: Negative.  Negative for neck pain.  Skin: Negative.  Negative for itching and rash.  Neurological:  Positive for tingling. Negative for dizziness, focal weakness, weakness and headaches.  Psychiatric/Behavioral: Negative.  The patient is not nervous/anxious.     As per HPI. Otherwise, a complete review of systems is negative.  PAST MEDICAL HISTORY: Past Medical History:  Diagnosis Date   Cancer [facility])    Coronary artery disease     Hypertension     PAST SURGICAL HISTORY: Past Surgical History:  Procedure Laterality Date   CORONARY ARTERY BYPASS GRAFT  2010   Quadruple   DIRECT LARYNGOSCOPY  11/28/2020   Procedure: DIRECT LARYNGOSCOPY WITH BIOPSY;  Surgeon: Beverly Gust, MD;  Location: [facility] ORS;  Service: ENT;;   IR IMAGING GUIDED PORT INSERTION  01/01/2021   PULMONARY THROMBECTOMY Bilateral 01/13/2021   Procedure: PULMONARY THROMBECTOMY;  Surgeon: Katha Cabal, MD;  Location: [place] CV LAB;  Service: Cardiovascular;  Laterality: Bilateral;   TRACHEOSTOMY TUBE PLACEMENT N/A 11/28/2020   Procedure: AWAKE TRACHEOSTOMY;  Surgeon: Beverly Gust, MD;  Location: [facility] ORS;  Service: ENT;  Laterality: N/A;    FAMILY HISTORY: Family History  Problem Relation Age of Onset   Arthritis Mother    Heart attack Father    Brain cancer Sister     ADVANCED DIRECTIVES (Y/N):  N  HEALTH MAINTENANCE: Social History   Tobacco Use   Smoking status: Former    Types: Cigarettes    Quit date: 10/17/2008    Years since quitting: 13.1   Smokeless tobacco: Never  Vaping Use   Vaping Use: Never used  Substance Use Topics   Alcohol use: Yes    Comment: occasionally   Drug use: Never     Colonoscopy:  PAP:  Bone density:  Lipid panel:  No Known Allergies  Current Outpatient Medications  Medication Sig Dispense Refill   ALPRAZolam (XANAX) 0.5 MG tablet Take 1 tablet (0.5 mg total) by mouth daily as needed for anxiety (Take 1 hour prior to radiation). (Patient not taking: Reported on 04/14/2021) 30 tablet 1   amLODipine (NORVASC) 10 MG tablet Take 10 mg by mouth daily.  apixaban (ELIQUIS) 5 MG TABS tablet Take 1 tablet (5 mg total) by mouth 2 (two) times daily. 60 tablet 3   ascorbic acid (VITAMIN C) 500 MG tablet Take by mouth.     aspirin 81 MG chewable tablet Chew by mouth. (Patient not taking: Reported on 11/25/2021)     betamethasone valerate ointment (VALISONE) 0.1 % Apply 1 application. topically  2 (two) times daily. For up to 2 weeks (Patient not taking: Reported on 09/23/2021) 30 g 0   cyanocobalamin 1000 MCG tablet Take by mouth.     DENTA 5000 PLUS 1.1 % CREA dental cream      fluticasone (FLONASE) 50 MCG/ACT nasal spray Place 1 spray into both nostrils daily. (Patient not taking: Reported on 10/14/2021) 15.8 mL 0   gentamicin ointment (GARAMYCIN) 0.1 % Apply topically.     hydrALAZINE (APRESOLINE) 50 MG tablet Take 50 mg by mouth in the morning and at bedtime. 1.5 tab BID     ipratropium-albuterol (DUONEB) 0.5-2.5 (3) MG/3ML SOLN Take 3 mLs by nebulization every 6 (six) hours. (Patient not taking: Reported on 03/24/2021) 360 mL 0   metoprolol succinate (TOPROL-XL) 25 MG 24 hr tablet Take 25 mg by mouth daily.     olmesartan (BENICAR) 40 MG tablet Take 40 mg by mouth daily.     ondansetron (ZOFRAN) 8 MG tablet Take 1 tablet (8 mg total) by mouth every 8 (eight) hours as needed for nausea or vomiting. (Patient not taking: Reported on 04/14/2021) 60 tablet 2   predniSONE (DELTASONE) 10 MG tablet Take 6 tablets (60 mg total) by mouth daily with breakfast. Then start taper pack. (Patient not taking: Reported on 11/25/2021) 42 tablet 0   predniSONE (STERAPRED UNI-PAK 21 TAB) 10 MG (21) TBPK tablet 6 tablets (60 mg total) by mouth first day, 5 tablets second day, 4 tablets third day, 3 tablets fourth day, 2 fifth day and 1 tablet last day for a total of 21 tablets (Patient not taking: Reported on 10/07/2021) 21 tablet 0   prochlorperazine (COMPAZINE) 10 MG tablet Take 1 tablet (10 mg total) by mouth every 6 (six) hours as needed for nausea or vomiting. (Patient not taking: Reported on 09/23/2021) 60 tablet 2   rosuvastatin (CRESTOR) 40 MG tablet Take 40 mg by mouth daily.     No current facility-administered medications for this visit.   Facility-Administered Medications Ordered in Other Visits  Medication Dose Route Frequency Provider Last Rate Last Admin   diphenhydrAMINE (BENADRYL) 50 MG/ML  injection            famotidine (PEPCID) 20-0.9 MG/50ML-% IVPB            heparin lock flush 100 UNIT/ML injection            heparin lock flush 100 UNIT/ML injection            heparin lock flush 100 unit/mL  500 Units Intravenous Once Grayland Ormond, Kathlene November, MD       palonosetron (ALOXI) 0.25 MG/5ML injection            sodium chloride flush (NS) 0.9 % injection 10 mL  10 mL Intravenous PRN Lloyd Huger, MD   10 mL at 01/06/21 1305    OBJECTIVE: There were no vitals filed for this visit.     There is no height or weight on file to calculate BMI.    ECOG FS:0 - Asymptomatic  General: Well-developed, well-nourished, no acute distress. Eyes: Pink conjunctiva, anicteric sclera. HEENT: Normocephalic,  moist mucous membranes.  Trach in place. Lungs: No audible wheezing or coughing. Heart: Regular rate and rhythm. Abdomen: Soft, nontender, no obvious distention. Musculoskeletal: No edema, cyanosis, or clubbing. Neuro: Alert, answering all questions appropriately. Cranial nerves grossly intact. Skin: No rashes or petechiae noted. Psych: Normal affect.   LAB RESULTS:  Lab Results  Component Value Date   NA 139 11/25/2021   K 3.6 11/25/2021   CL 104 11/25/2021   CO2 26 11/25/2021   GLUCOSE 117 (H) 11/25/2021   BUN 27 (H) 11/25/2021   CREATININE 1.49 (H) 11/25/2021   CALCIUM 9.2 11/25/2021   PROT 7.0 11/25/2021   ALBUMIN 3.9 11/25/2021   AST 22 11/25/2021   ALT 18 11/25/2021   ALKPHOS 40 11/25/2021   BILITOT 0.6 11/25/2021   GFRNONAA 52 (L) 11/25/2021   GFRAA 37 (L) 11/13/2019    Lab Results  Component Value Date   WBC 5.7 11/25/2021   NEUTROABS 3.6 11/25/2021   HGB 11.2 (L) 11/25/2021   HCT 34.3 (L) 11/25/2021   MCV 97.7 11/25/2021   PLT 203 11/25/2021     STUDIES: No results found.  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  PLAN:    1.  Recurrent stage IVc squamous cell carcinoma of the supraglottis, p16  positive: Patient had rapidly progressive metastatic recurrence of disease and required replacement of his trach.  Case discussed with [facility] head and neck physician, Dr. Ernst Bowler, and consensus was to restart chemotherapy with carboplatinum, Taxol, and Keytruda on a 21-day cycle.  Repeat PET scan reviewed independently and report as above reveals persistent malignancy.  Patient will require some sort of maintenance treatment more than just single agent Keytruda.  Possibly Keytruda plus lenvatinib.  Proceed with cycle 5, day 8 of treatment today, carboplatinum and Taxol only.  Patient has an appointment [facility] along with PET scan on December 15, 2021, therefore will return to clinic on December 16, 2021 for further evaluation and consideration of additional treatment.      2.  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  PET scan results from November 12, 2019 reviewed independently with no obvious evidence of recurrent or progressive disease.  Level 2 lymph node is no longer hypermetabolic.  3.  Renal insufficiency: Creatinine has trended up slightly to 1.49, monitor.   4.  Anemia: Hemoglobin mildly improved to 11.2. 5.  Hypokalemia: Resolved. 6.  Pulmonary embolism: Diagnosed in September 2022.  Continue Eliquis as prescribed.  Patient will require a minimum of 6 months of treatment. 7.  Leukopenia: Resolved. 8.  Peripheral neuropathy: Chronic and unchanged.  Possibly related to Taxol.  Monitor closely now but Taxol is being reinitiated. 9.  Trach: Patient was evaluated recently and it was determined not to remove his trach.  He reports he does not have follow-up back with ENT until October.   10.  Rash: Resolved with steroid taper.  Patient expressed understanding and was in agreement with this plan. He also understands that He can call clinic at any time with any questions, concerns, or complaints.    Cancer Staging   Malignant neoplasm of supraglottis [facility]) Staging form: Larynx - Supraglottis, AJCC 8th Edition - Clinical stage from 12/11/2020: Stage IVC (cT3, cN2b, cM1) - Signed by Lloyd Huger, MD on 01/26/2021  Primary squamous cell carcinoma of tonsil ([place]) Staging form: Pharynx - HPV-Mediated Oropharynx, AJCC 8th Edition -  Clinical: No stage assigned - Unsigned   Lloyd Huger, MD   12/15/2021 11:11 PM

## 2021-12-16 ENCOUNTER — Ambulatory Visit: Payer: Medicare Other

## 2021-12-16 ENCOUNTER — Other Ambulatory Visit (HOSPITAL_COMMUNITY): Payer: Self-pay

## 2021-12-16 ENCOUNTER — Other Ambulatory Visit: Payer: Medicare Other

## 2021-12-16 ENCOUNTER — Encounter: Payer: Self-pay | Admitting: Oncology

## 2021-12-16 ENCOUNTER — Telehealth: Payer: Self-pay | Admitting: Pharmacist

## 2021-12-16 ENCOUNTER — Inpatient Hospital Stay: Payer: Medicare Other | Admitting: Oncology

## 2021-12-16 ENCOUNTER — Inpatient Hospital Stay: Payer: Medicare Other

## 2021-12-16 ENCOUNTER — Telehealth: Payer: Self-pay

## 2021-12-16 ENCOUNTER — Ambulatory Visit: Payer: Medicare Other | Admitting: Oncology

## 2021-12-16 ENCOUNTER — Telehealth: Payer: Self-pay | Admitting: Pharmacy Technician

## 2021-12-16 DIAGNOSIS — C321 Malignant neoplasm of supraglottis: Secondary | ICD-10-CM

## 2021-12-16 MED ORDER — LENVIMA (14 MG DAILY DOSE) 10 & 4 MG PO CPPK: 14.0000 mg | ORAL_CAPSULE | Freq: Every day | ORAL | 1 refills | Status: DC

## 2021-12-16 NOTE — Telephone Encounter (Signed)
Oral Chemotherapy Pharmacist Encounter   Appeal submitted via CMM on 12/16/21. Awaiting determination.   Darl Pikes, PharmD, BCPS, BCOP, CPP Hematology/Oncology Clinical Pharmacist [place]/DB/AP Oral [facility] [phone]  12/16/2021 12:41 PM

## 2021-12-16 NOTE — Telephone Encounter (Signed)
Oral Oncology Patient Advocate Encounter   An urgent appeal for the prior authorization denial of Michel Santee has been started by the pharmacist on 12/16/2021.   This encounter will continue to be updated until final appeal determination.      [name], CPhT-Adv Oncology Pharmacy Patient [place] Direct Number: [phone]  Fax: [phone]

## 2021-12-16 NOTE — Telephone Encounter (Addendum)
Called and spoke with patient in regards to him missing his appt today with Dr. Sinclair Grooms. Patient states that his [facility] oncologist advised that he cancel his appt today because they were going to switch tx. Advised that Dr. Grayland Ormond still wants patient to receive Keytruda at this time. Advised Dr. Grayland Ormond plans to start him on oral chemotherapy as well.  Informed patient that Nuala Alpha will be in contact with him to discuss Oral chemotherapy tx per Dr. Grayland Ormond. We will wait to r/s patient until after he speaks with Alyson. Patient verbalized understanding and apologized for missing his appt.

## 2021-12-16 NOTE — Telephone Encounter (Signed)
Oral Oncology Pharmacist Encounter  Received new prescription for Lenvima (lenvatinib) for the treatment of progressive metastatic squamous cell carcinoma of the supraglottis in conjunction with pembrolizumab (patient is actively on this therapy), planned duration until disease progression or unacceptable drug toxicity.  CMP from 11/25/21 assessed, no relevant lab abnormalities. Prescription dose and frequency assessed.   Current medication list in Epic reviewed, no DDIs with lenvatinib identified.  Evaluated chart and no patient barriers to medication adherence identified.   Prescription has been e-scribed to the [facility] for benefits analysis and approval.  Oral Oncology Clinic will continue to follow for insurance authorization, copayment issues, initial counseling and start date.  Darl Pikes, PharmD, BCPS, BCOP, CPP Hematology/Oncology Clinical Pharmacist Practitioner [place]/DB/AP Oral [facility] [phone]  12/16/2021 9:51 AM

## 2021-12-16 NOTE — Telephone Encounter (Signed)
Oral Oncology Patient Advocate Encounter   Received notification that prior authorization for Lenvima is required.   PA submitted on 12/16/2021 Key [id number] Status is pending     [name], CPhT-Adv Oncology Pharmacy Patient [place] Direct Number: [phone]  Fax: [phone]

## 2021-12-18 ENCOUNTER — Other Ambulatory Visit (HOSPITAL_COMMUNITY): Payer: Self-pay

## 2021-12-22 ENCOUNTER — Other Ambulatory Visit: Payer: Self-pay | Admitting: Oncology

## 2021-12-22 ENCOUNTER — Other Ambulatory Visit (HOSPITAL_COMMUNITY): Payer: Self-pay

## 2021-12-22 ENCOUNTER — Telehealth: Payer: Self-pay | Admitting: Pharmacy Technician

## 2021-12-22 DIAGNOSIS — C321 Malignant neoplasm of supraglottis: Secondary | ICD-10-CM

## 2021-12-22 MED FILL — Fosaprepitant Dimeglumine For IV Infusion 150 MG (Base Eq): INTRAVENOUS | Qty: 5 | Status: AC

## 2021-12-22 MED FILL — Dexamethasone Sodium Phosphate Inj 100 MG/10ML: INTRAMUSCULAR | Qty: 1 | Status: AC

## 2021-12-22 NOTE — Progress Notes (Signed)
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  INTERVAL HISTORY: Patient returns to clinic today for further evaluation and initiation of treatment with maintenance Keytruda and lenvatinib.  He feels the swelling in his neck around his trach is getting worse.  He continues to have a mild peripheral neuropathy, but otherwise is tolerating his treatments well.  He has no other neurologic complaints.  He denies any pain today.  He denies any recent fevers or illnesses. He has no chest pain, shortness of breath, cough, or hemoptysis.  He denies any nausea, vomiting, constipation, or diarrhea.  He has no urinary complaints.  Patient is no further specific complaints today.  REVIEW OF SYSTEMS:   Review of Systems  Constitutional: Negative.  Negative for fever, malaise/fatigue and weight loss.  Respiratory: Negative.  Negative for cough, hemoptysis and shortness of breath.   Cardiovascular: Negative.  Negative for chest pain and leg swelling.  Gastrointestinal: Negative.  Negative for abdominal pain.  Genitourinary: Negative.  Negative for dysuria.  Musculoskeletal: Negative.  Negative for neck pain.  Skin: Negative.  Negative for itching and rash.  Neurological:  Positive for tingling. Negative for dizziness, focal weakness, weakness and headaches.  Psychiatric/Behavioral: Negative.  The patient is not nervous/anxious.     As per HPI. Otherwise, a complete review of systems is negative.  PAST MEDICAL HISTORY: Past Medical History:  Diagnosis Date   Cancer [facility])     Coronary artery disease    Hypertension     PAST SURGICAL HISTORY: Past Surgical History:  Procedure Laterality Date   CORONARY ARTERY BYPASS GRAFT  2010   Quadruple   DIRECT LARYNGOSCOPY  11/28/2020   Procedure: DIRECT LARYNGOSCOPY WITH BIOPSY;  Surgeon: Beverly Gust, MD;  Location: [facility] ORS;  Service: ENT;;   IR IMAGING GUIDED PORT INSERTION  01/01/2021   PULMONARY THROMBECTOMY Bilateral 01/13/2021   Procedure: PULMONARY THROMBECTOMY;  Surgeon: Katha Cabal, MD;  Location: [place] CV LAB;  Service: Cardiovascular;  Laterality: Bilateral;   TRACHEOSTOMY TUBE PLACEMENT N/A 11/28/2020   Procedure: AWAKE TRACHEOSTOMY;  Surgeon: Beverly Gust, MD;  Location: [facility] ORS;  Service: ENT;  Laterality: N/A;    FAMILY HISTORY: Family History  Problem Relation Age of Onset   Arthritis Mother    Heart attack Father    Brain cancer Sister     ADVANCED DIRECTIVES (Y/N):  N  HEALTH MAINTENANCE: Social History   Tobacco Use   Smoking status: Former    Types: Cigarettes    Quit date: 10/17/2008    Years since quitting: 13.1   Smokeless tobacco: Never  Vaping Use   Vaping Use: Never used  Substance Use Topics   Alcohol use: Yes    Comment: occasionally   Drug use: Never     Colonoscopy:  PAP:  Bone density:  Lipid panel:  No Known Allergies  Current Outpatient Medications  Medication Sig Dispense Refill   amLODipine (NORVASC) 10 MG tablet Take 10 mg by mouth daily.     apixaban (ELIQUIS) 5 MG TABS tablet Take 1 tablet (5 mg total) by mouth 2 (two) times daily. 60 tablet 3   ascorbic acid (VITAMIN  C) 500 MG tablet Take by mouth.     cyanocobalamin 1000 MCG tablet Take by mouth.     DENTA 5000 PLUS 1.1 % CREA dental cream      gentamicin ointment (GARAMYCIN) 0.1 % Apply topically.     hydrALAZINE (APRESOLINE) 50 MG tablet Take 50 mg by mouth in the morning and at bedtime. 1.5 tab BID     lenvatinib 14 mg daily dose (LENVIMA, 14 MG DAILY DOSE,) 10 & 4 MG capsule  Take 14 mg by mouth daily. 60 capsule 1   metoprolol succinate (TOPROL-XL) 25 MG 24 hr tablet Take 25 mg by mouth daily.     olmesartan (BENICAR) 40 MG tablet Take 40 mg by mouth daily.     rosuvastatin (CRESTOR) 40 MG tablet Take 40 mg by mouth daily.     ALPRAZolam (XANAX) 0.5 MG tablet Take 1 tablet (0.5 mg total) by mouth daily as needed for anxiety (Take 1 hour prior to radiation). (Patient not taking: Reported on 04/14/2021) 30 tablet 1   aspirin 81 MG chewable tablet Chew by mouth. (Patient not taking: Reported on 11/25/2021)     betamethasone valerate ointment (VALISONE) 0.1 % Apply 1 application. topically 2 (two) times daily. For up to 2 weeks (Patient not taking: Reported on 09/23/2021) 30 g 0   ipratropium-albuterol (DUONEB) 0.5-2.5 (3) MG/3ML SOLN Take 3 mLs by nebulization every 6 (six) hours. (Patient not taking: Reported on 03/24/2021) 360 mL 0   prochlorperazine (COMPAZINE) 10 MG tablet Take 1 tablet (10 mg total) by mouth every 6 (six) hours as needed for nausea or vomiting. 30 tablet 2   No current facility-administered medications for this visit.   Facility-Administered Medications Ordered in Other Visits  Medication Dose Route Frequency Provider Last Rate Last Admin   diphenhydrAMINE (BENADRYL) 50 MG/ML injection            famotidine (PEPCID) 20-0.9 MG/50ML-% IVPB            heparin lock flush 100 UNIT/ML injection            heparin lock flush 100 UNIT/ML injection            heparin lock flush 100 unit/mL  500 Units Intravenous Once Grayland Ormond, Kathlene November, MD       palonosetron (ALOXI) 0.25 MG/5ML injection            sodium chloride flush (NS) 0.9 % injection 10 mL  10 mL Intravenous PRN Lloyd Huger, MD   10 mL at 01/06/21 1305    OBJECTIVE: Vitals:   12/23/21 0955  BP: 124/72  Resp: 18  Temp: (!) 97.2 F (36.2 C)      Body mass index is 31.22 kg/m.    ECOG FS:0 - Asymptomatic  General: Well-developed, well-nourished, no acute distress. Eyes: Pink  conjunctiva, anicteric sclera. HEENT: Normocephalic, moist mucous membranes.  Trach noted with significant bilateral lymphadenopathy. Lungs: No audible wheezing or coughing. Heart: Regular rate and rhythm. Abdomen: Soft, nontender, no obvious distention. Musculoskeletal: No edema, cyanosis, or clubbing. Neuro: Alert, answering all questions appropriately. Cranial nerves grossly intact. Skin: No rashes or petechiae noted. Psych: Normal affect.  LAB RESULTS:  Lab Results  Component Value Date   NA 140 12/23/2021   K 3.1 (L) 12/23/2021   CL 109 12/23/2021   CO2 24 12/23/2021   GLUCOSE 111 (H) 12/23/2021   BUN 23 12/23/2021   CREATININE 1.49 (H) 12/23/2021   CALCIUM 9.0 12/23/2021   PROT 7.2 12/23/2021  ALBUMIN 4.1 12/23/2021   AST 18 12/23/2021   ALT 14 12/23/2021   ALKPHOS 38 12/23/2021   BILITOT 0.3 12/23/2021   GFRNONAA 52 (L) 12/23/2021   GFRAA 37 (L) 11/13/2019    Lab Results  Component Value Date   WBC 7.6 12/23/2021   NEUTROABS 5.5 12/23/2021   HGB 11.1 (L) 12/23/2021   HCT 34.0 (L) 12/23/2021   MCV 97.7 12/23/2021   PLT 213 12/23/2021     STUDIES: No results found.  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  PLAN:    1.  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  Case discussed with [facility] head and neck physician, Dr. Ernst Bowler, and consensus was to restart chemotherapy with carboplatinum, Taxol, and Keytruda on a 21-day cycle.  Repeat PET scan reviewed independently and report as above reveals persistent malignancy.  Patient received 5 cycles of treatment completing on November 25, 2021.  He has now transitioned to [facility] every [redacted] weeks along with lenvatinib.  Proceed with treatment today.  Return to clinic in 1 week for laboratory work and further evaluation and then in 3 weeks  for laboratory work, further evaluation, and continuation of treatment.      2.  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  PET scan results from November 12, 2019 reviewed independently with no obvious evidence of recurrent or progressive disease.  Level 2 lymph node is no longer hypermetabolic.  3.  Renal insufficiency: Chronic and unchanged.  Patient creatinine is 1.49 today. 4.  Anemia: Chronic and unchanged.  Patient's hemoglobin is 11.1 today. 5.  Hypokalemia: Potassium is trended down to 3.1, monitor. 6.  Pulmonary embolism: Diagnosed in September 2022.  Continue Eliquis as prescribed.  Patient will require a minimum of 6 months of treatment. 7.  Leukopenia: Resolved. 8.  Peripheral neuropathy: Chronic and unchanged.  Possibly related to Taxol.  Discontinue Taxol as above.   9.  Trach: Patient was evaluated recently and it was determined not to remove his trach.  He reports he does not have follow-up back with ENT until October.   10.  Rash: Resolved with steroid taper.  Patient expressed understanding and was in agreement with this plan. He also understands that He can call clinic at any time with any questions, concerns, or complaints.    Cancer Staging  Malignant neoplasm of supraglottis [facility]) Staging form: Larynx - Supraglottis, AJCC 8th Edition - Clinical stage from 12/11/2020: Stage IVC (cT3, cN2b, cM1) - Signed by Lloyd Huger, MD on 01/26/2021  Primary squamous cell carcinoma of tonsil ([place]) Staging form: Pharynx - HPV-Mediated Oropharynx, AJCC 8th Edition - Clinical: No stage assigned - Unsigned   Lloyd Huger, MD   12/25/2021 9:27 AM

## 2021-12-22 NOTE — Telephone Encounter (Signed)
Oral Oncology Patient Advocate Encounter  Received notification that the request for prior authorization for Scott Harding has been denied.      Patient Assistance process has been started for patient.   [name], CPhT-Adv Oncology Pharmacy Patient [place] Direct Number: [phone]  Fax: [phone]

## 2021-12-22 NOTE — Telephone Encounter (Signed)
Oral Oncology Patient Advocate Encounter   Began application for assistance for Lenvima through [facility].   Application will be submitted upon completion of necessary supporting documentation.   [place] phone number [phone].   I will continue to check the status until final determination.   [name], CPhT-Adv Oncology Pharmacy Patient [place] Direct Number: [phone]  Fax: [phone]

## 2021-12-23 ENCOUNTER — Encounter: Payer: Self-pay | Admitting: Oncology

## 2021-12-23 ENCOUNTER — Inpatient Hospital Stay: Payer: Medicare Other

## 2021-12-23 ENCOUNTER — Inpatient Hospital Stay: Payer: Medicare Other | Admitting: Pharmacist

## 2021-12-23 ENCOUNTER — Inpatient Hospital Stay: Payer: Medicare Other | Attending: Oncology | Admitting: Oncology

## 2021-12-23 VITALS — BP 124/72 | Temp 97.2°F | Resp 18 | Wt 211.4 lb

## 2021-12-23 DIAGNOSIS — C321 Malignant neoplasm of supraglottis: Secondary | ICD-10-CM | POA: Diagnosis present

## 2021-12-23 DIAGNOSIS — N289 Disorder of kidney and ureter, unspecified: Secondary | ICD-10-CM | POA: Diagnosis not present

## 2021-12-23 DIAGNOSIS — E876 Hypokalemia: Secondary | ICD-10-CM | POA: Diagnosis not present

## 2021-12-23 DIAGNOSIS — Z87891 Personal history of nicotine dependence: Secondary | ICD-10-CM | POA: Insufficient documentation

## 2021-12-23 DIAGNOSIS — Z79899 Other long term (current) drug therapy: Secondary | ICD-10-CM | POA: Diagnosis not present

## 2021-12-23 DIAGNOSIS — I2699 Other pulmonary embolism without acute cor pulmonale: Secondary | ICD-10-CM | POA: Diagnosis not present

## 2021-12-23 DIAGNOSIS — Z7901 Long term (current) use of anticoagulants: Secondary | ICD-10-CM | POA: Insufficient documentation

## 2021-12-23 DIAGNOSIS — I10 Essential (primary) hypertension: Secondary | ICD-10-CM | POA: Diagnosis not present

## 2021-12-23 DIAGNOSIS — Z5112 Encounter for antineoplastic immunotherapy: Secondary | ICD-10-CM | POA: Diagnosis present

## 2021-12-23 DIAGNOSIS — Z930 Tracheostomy status: Secondary | ICD-10-CM | POA: Insufficient documentation

## 2021-12-23 DIAGNOSIS — D649 Anemia, unspecified: Secondary | ICD-10-CM | POA: Diagnosis not present

## 2021-12-23 DIAGNOSIS — G629 Polyneuropathy, unspecified: Secondary | ICD-10-CM | POA: Diagnosis not present

## 2021-12-23 LAB — CBC WITH DIFFERENTIAL/PLATELET
Abs Immature Granulocytes: 0.03 10*3/uL (ref 0.00–0.07)
Basophils Absolute: 0 10*3/uL (ref 0.0–0.1)
Basophils Relative: 0 %
Eosinophils Absolute: 0.1 10*3/uL (ref 0.0–0.5)
Eosinophils Relative: 2 %
HCT: 34 % — ABNORMAL LOW (ref 39.0–52.0)
Hemoglobin: 11.1 g/dL — ABNORMAL LOW (ref 13.0–17.0)
Immature Granulocytes: 0 %
Lymphocytes Relative: 12 %
Lymphs Abs: 0.9 10*3/uL (ref 0.7–4.0)
MCH: 31.9 pg (ref 26.0–34.0)
MCHC: 32.6 g/dL (ref 30.0–36.0)
MCV: 97.7 fL (ref 80.0–100.0)
Monocytes Absolute: 1 10*3/uL (ref 0.1–1.0)
Monocytes Relative: 13 %
Neutro Abs: 5.5 10*3/uL (ref 1.7–7.7)
Neutrophils Relative %: 73 %
Platelets: 213 10*3/uL (ref 150–400)
RBC: 3.48 MIL/uL — ABNORMAL LOW (ref 4.22–5.81)
RDW: 14.2 % (ref 11.5–15.5)
WBC: 7.6 10*3/uL (ref 4.0–10.5)
nRBC: 0 % (ref 0.0–0.2)

## 2021-12-23 LAB — COMPREHENSIVE METABOLIC PANEL
ALT: 14 U/L (ref 0–44)
AST: 18 U/L (ref 15–41)
Albumin: 4.1 g/dL (ref 3.5–5.0)
Alkaline Phosphatase: 38 U/L (ref 38–126)
Anion gap: 7 (ref 5–15)
BUN: 23 mg/dL (ref 8–23)
CO2: 24 mmol/L (ref 22–32)
Calcium: 9 mg/dL (ref 8.9–10.3)
Chloride: 109 mmol/L (ref 98–111)
Creatinine, Ser: 1.49 mg/dL — ABNORMAL HIGH (ref 0.61–1.24)
GFR, Estimated: 52 mL/min — ABNORMAL LOW (ref >60)
Glucose, Bld: 111 mg/dL — ABNORMAL HIGH (ref 70–99)
Potassium: 3.1 mmol/L — ABNORMAL LOW (ref 3.5–5.1)
Sodium: 140 mmol/L (ref 135–145)
Total Bilirubin: 0.3 mg/dL (ref 0.3–1.2)
Total Protein: 7.2 g/dL (ref 6.5–8.1)

## 2021-12-23 LAB — TSH: TSH: 3.168 u[IU]/mL (ref 0.350–4.500)

## 2021-12-23 MED ORDER — PROCHLORPERAZINE MALEATE 10 MG PO TABS: 10.0000 mg | ORAL_TABLET | Freq: Four times a day (QID) | ORAL | 2 refills | Status: AC | PRN

## 2021-12-23 MED ORDER — HEPARIN SOD (PORK) LOCK FLUSH 100 UNIT/ML IV SOLN
500.0000 [IU] | Freq: Once | INTRAVENOUS | Status: AC | PRN
  Administered 2021-12-23: 500 [IU]
  Filled 2021-12-23: qty 5

## 2021-12-23 MED ORDER — SODIUM CHLORIDE 0.9 % IV SOLN
200.0000 mg | Freq: Once | INTRAVENOUS | Status: AC
  Administered 2021-12-23: 200 mg via INTRAVENOUS
  Filled 2021-12-23: qty 8

## 2021-12-23 MED ORDER — SODIUM CHLORIDE 0.9 % IV SOLN
Freq: Once | INTRAVENOUS | Status: AC
  Filled 2021-12-23: qty 250

## 2021-12-23 NOTE — Progress Notes (Signed)
[place]  Telephone:([phone] [phone]  Patient Care Team: Idelle Crouch, MD as PCP - General (Internal Medicine) Beverly Gust, MD (Otolaryngology) Lloyd Huger, MD as Consulting Physician (Oncology) Noreene Filbert, MD as Referring Physician (Radiation Oncology)   Name of the patient: Scott Harding  [id number]  1955/05/31   Date of visit: 12/23/21  HPI: Patient is a 66 y.o. 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 Planned treatment pembrolizumab and Lenvima (lenvatinib). He is currently receiving the pembrolizumab and the lenvatinib will be added on today 12/23/21.  Reason for Consult: Lenvima (lenvatinib) oral chemotherapy education.   PAST MEDICAL HISTORY: Past Medical History:  Diagnosis Date   Cancer [facility] [place])    Coronary artery disease    Hypertension     HEMATOLOGY/ONCOLOGY HISTORY:  Oncology History  Primary squamous cell carcinoma of tonsil ([place])  09/15/2018 Initial Diagnosis   Squamous cell carcinoma of left tonsil ([place])   10/04/2018 - 11/22/2018 Chemotherapy         Malignant neoplasm of supraglottis ([place])  12/11/2020 Initial Diagnosis   Malignant neoplasm of supraglottis ([place])   12/11/2020 Cancer Staging   Staging form: Larynx - Supraglottis, AJCC 8th Edition - Clinical stage from 12/11/2020: Stage IVC (cT3, cN2b, cM1) - Signed by Lloyd Huger, MD on 01/26/2021   12/30/2020 - 11/25/2021 Chemotherapy   Patient is on Treatment Plan : HEAD & NECK Carboplatin + Paclitaxel + Pembrolizumab q21d x 4 cycles / THEN Paclitaxel/Pembrolizumab Maintenance Q21D     08/20/2021 -  Chemotherapy   Patient is on Treatment Plan : Pembrolizumab (200) Maintenance D1 q21d       ALLERGIES:  has No Known Allergies.  MEDICATIONS:  Current Outpatient Medications  Medication Sig Dispense Refill   ALPRAZolam (XANAX) 0.5 MG tablet Take 1 tablet (0.5 mg total) by mouth daily as  needed for anxiety (Take 1 hour prior to radiation). (Patient not taking: Reported on 04/14/2021) 30 tablet 1   amLODipine (NORVASC) 10 MG tablet Take 10 mg by mouth daily.     apixaban (ELIQUIS) 5 MG TABS tablet Take 1 tablet (5 mg total) by mouth 2 (two) times daily. 60 tablet 3   ascorbic acid (VITAMIN C) 500 MG tablet Take by mouth.     aspirin 81 MG chewable tablet Chew by mouth. (Patient not taking: Reported on 11/25/2021)     betamethasone valerate ointment (VALISONE) 0.1 % Apply 1 application. topically 2 (two) times daily. For up to 2 weeks (Patient not taking: Reported on 09/23/2021) 30 g 0   cyanocobalamin 1000 MCG tablet Take by mouth.     DENTA 5000 PLUS 1.1 % CREA dental cream      gentamicin ointment (GARAMYCIN) 0.1 % Apply topically.     hydrALAZINE (APRESOLINE) 50 MG tablet Take 50 mg by mouth in the morning and at bedtime. 1.5 tab BID     ipratropium-albuterol (DUONEB) 0.5-2.5 (3) MG/3ML SOLN Take 3 mLs by nebulization every 6 (six) hours. (Patient not taking: Reported on 03/24/2021) 360 mL 0   lenvatinib 14 mg daily dose (LENVIMA, 14 MG DAILY DOSE,) 10 & 4 MG capsule Take 14 mg by mouth daily. 60 capsule 1   metoprolol succinate (TOPROL-XL) 25 MG 24 hr tablet Take 25 mg by mouth daily.     olmesartan (BENICAR) 40 MG tablet Take 40 mg by mouth daily.     rosuvastatin (CRESTOR) 40 MG tablet Take 40 mg by mouth daily.  No current facility-administered medications for this visit.   Facility-Administered Medications Ordered in Other Visits  Medication Dose Route Frequency Provider Last Rate Last Admin   diphenhydrAMINE (BENADRYL) 50 MG/ML injection            famotidine (PEPCID) 20-0.9 MG/50ML-% IVPB            heparin lock flush 100 UNIT/ML injection            heparin lock flush 100 UNIT/ML injection            heparin lock flush 100 unit/mL  500 Units Intravenous Once Grayland Ormond, Kathlene November, MD       palonosetron (ALOXI) 0.25 MG/5ML injection            sodium chloride flush  (NS) 0.9 % injection 10 mL  10 mL Intravenous PRN Lloyd Huger, MD   10 mL at 01/06/21 1305    VITAL SIGNS: There were no vitals taken for this visit. There were no vitals filed for this visit.  Estimated body mass index is 31.22 kg/m as calculated from the following:   Height as of 10/28/21: '5\' 9"'$  (1.753 m).   Weight as of an earlier encounter on 12/23/21: 95.9 kg (211 lb 6.4 oz).  LABS: CBC:    Component Value Date/Time   WBC 7.6 12/23/2021 0905   HGB 11.1 (L) 12/23/2021 0905   HCT 34.0 (L) 12/23/2021 0905   PLT 213 12/23/2021 0905   MCV 97.7 12/23/2021 0905   NEUTROABS 5.5 12/23/2021 0905   LYMPHSABS 0.9 12/23/2021 0905   MONOABS 1.0 12/23/2021 0905   EOSABS 0.1 12/23/2021 0905   BASOSABS 0.0 12/23/2021 0905   Comprehensive Metabolic Panel:    Component Value Date/Time   NA 140 12/23/2021 0905   K 3.1 (L) 12/23/2021 0905   CL 109 12/23/2021 0905   CO2 24 12/23/2021 0905   BUN 23 12/23/2021 0905   CREATININE 1.49 (H) 12/23/2021 0905   GLUCOSE 111 (H) 12/23/2021 0905   CALCIUM 9.0 12/23/2021 0905   AST 18 12/23/2021 0905   ALT 14 12/23/2021 0905   ALKPHOS 38 12/23/2021 0905   BILITOT 0.3 12/23/2021 0905   PROT 7.2 12/23/2021 0905   ALBUMIN 4.1 12/23/2021 0905     Present during today's visit: Patient only  Start plan: Patient will start his lenvatinib today   Patient Education I spoke with patient for overview of new oral chemotherapy medication: lenvatinib   Administration: Counseled patient on administration, dosing, side effects, monitoring, drug-food interactions, safe handling, storage, and disposal. Patient will take 1 capsule ('10mg'$ ) by mouth daily. **He will start using supply from the office, he is is approved for manufacturer assistance, he will increased to the original planned dose of 14 mg daily.   Side Effects: Side effects include but not limited to: hypertension, diarrhea, fatigue, nausea, mouth sores, and hand-foot  syndrome. Hypertension: patient had blood pressure cuff at home to monitor blood pressure. Reviewed sign and symptoms of hypertension Diarrhea: Recommended using loperamide for diarrhea, patient stated he like to use pepto-bismol, he knows that he can give this a try, but should switch to loperamide if it is not working. He knows to call the office if he is having 4 or more loose stools per day.  Nausea: prescription sent in for prochlorperazine for him to use as needed Mouth sores: patient knows to request magic mouth wash if needed Hand-foot syndrome: patient provided with bottle of [place] Smooth Extra Care 20, he know to  report any skin changes he notices  Drug-drug Interactions (DDI): No current DDIs with lenvatinib   Adherence: After discussion with patient no patient barriers to medication adherence identified.  Reviewed with patient importance of keeping a medication schedule and plan for any missed doses.  Mr. Slawinski voiced understanding and appreciation. All questions answered. Medication handout provided.  Provided patient with Oral [facility] phone number. Patient knows to call the office with questions or concerns. Oral [facility]otherapy [facility] will continue to follow.  Patient expressed understanding and was in agreement with this plan. He also understands that He can call clinic at any time with any questions, concerns, or complaints.   Medication Access Issues: Manufacturer application pending submission   Follow-up plan: RTC in 1 week  Thank you for allowing me to participate in the care of this patient.   Time Total: 30 mins  Visit consisted of counseling and education on dealing with issues of symptom management in the setting of serious and potentially life-threatening illness.Greater than 50%  of this time was spent counseling and coordinating care related to the above assessment and plan.  Signed by: Darl Pikes, PharmD, BCPS, Salley Slaughter,  CPP Hematology/Oncology Clinical Pharmacist Practitioner [place]/DB/AP Oral [facility] [phone]  12/23/2021 1:26 PM

## 2021-12-23 NOTE — Patient Instructions (Signed)
[facility]  Discharge Instructions: Thank you for choosing [facility] to provide your oncology and hematology care.  If you have a lab appointment with the Cancer Center, please go directly to the Cancer Center and check in at the registration area.  Wear comfortable clothing and clothing appropriate for easy access to any Portacath or PICC line.   We strive to give you quality time with your provider. You may need to reschedule your appointment if you arrive late (15 or more minutes).  Arriving late affects you and other patients whose appointments are after yours.  Also, if you miss three or more appointments without notifying the office, you may be dismissed from the clinic at the provider's discretion.      For prescription refill requests, have your pharmacy contact our office and allow 72 hours for refills to be completed.    Today you received the following chemotherapy and/or immunotherapy agents KEYTRUDA      To help prevent nausea and vomiting after your treatment, we encourage you to take your nausea medication as directed.  BELOW ARE SYMPTOMS THAT SHOULD BE REPORTED IMMEDIATELY: *FEVER GREATER THAN 100.4 F (38 C) OR HIGHER *CHILLS OR SWEATING *NAUSEA AND VOMITING THAT IS NOT CONTROLLED WITH YOUR NAUSEA MEDICATION *UNUSUAL SHORTNESS OF BREATH *UNUSUAL BRUISING OR BLEEDING *URINARY PROBLEMS (pain or burning when urinating, or frequent urination) *BOWEL PROBLEMS (unusual diarrhea, constipation, pain near the anus) TENDERNESS IN MOUTH AND THROAT WITH OR WITHOUT PRESENCE OF ULCERS (sore throat, sores in mouth, or a toothache) UNUSUAL RASH, SWELLING OR PAIN  UNUSUAL VAGINAL DISCHARGE OR ITCHING   Items with * indicate a potential emergency and should be followed up as soon as possible or go to the Emergency Department if any problems should occur.  Please show the CHEMOTHERAPY ALERT CARD or IMMUNOTHERAPY ALERT CARD at check-in to  the Emergency Department and triage nurse.  Should you have questions after your visit or need to cancel or reschedule your appointment, please contact [facility]  [phone] and follow the prompts.  Office hours are 8:00 a.m. to 4:30 p.m. Monday - Friday. Please note that voicemails left after 4:00 p.m. may not be returned until the following business day.  We are closed weekends and major holidays. You have access to a nurse at all times for urgent questions. Please call the main number to the clinic [phone] and follow the prompts.  For any non-urgent questions, you may also contact your provider using MyChart. We now offer e-Visits for anyone 18 and older to request care online for non-urgent symptoms. For details visit [email or web address].   Also download the MyChart app! Go to the app store, search "MyChart", open the app, select [facility], and log in with your MyChart username and password.  Masks are optional in the cancer centers. If you would like for your care team to wear a mask while they are taking care of you, please let them know. For doctor visits, patients may have with them one support person who is at least 66 years old. At this time, visitors are not allowed in the infusion area.  Pembrolizumab Injection What is this medication? PEMBROLIZUMAB (PEM broe LIZ ue mab) treats some types of cancer. It works by helping your immune system slow or stop the spread of cancer cells. It is a monoclonal antibody. This medicine may be used for other purposes; ask your health care provider or pharmacist if you have questions.   COMMON BRAND NAME(S): Keytruda What should I tell my care team before I take this medication? They need to know if you have any of these conditions: Allogeneic stem cell transplant (uses someone else's stem cells) Autoimmune diseases, such as Crohn disease, ulcerative colitis, lupus History of chest radiation Nervous system  problems, such as Guillain-Barre syndrome, myasthenia gravis Organ transplant An unusual or allergic reaction to pembrolizumab, other medications, foods, dyes, or preservatives Pregnant or trying to get pregnant Breast-feeding How should I use this medication? This medication is injected into a vein. It is given by your care team in a hospital or clinic setting. A special MedGuide will be given to you before each treatment. Be sure to read this information carefully each time. Talk to your care team about the use of this medication in children. While it may be prescribed for children as young as 6 months for selected conditions, precautions do apply. Overdosage: If you think you have taken too much of this medicine contact a poison control center or emergency room at once. NOTE: This medicine is only for you. Do not share this medicine with others. What if I miss a dose? Keep appointments for follow-up doses. It is important not to miss your dose. Call your care team if you are unable to keep an appointment. What may interact with this medication? Interactions have not been studied. This list may not describe all possible interactions. Give your health care provider a list of all the medicines, herbs, non-prescription drugs, or dietary supplements you use. Also tell them if you smoke, drink alcohol, or use illegal drugs. Some items may interact with your medicine. What should I watch for while using this medication? Your condition will be monitored carefully while you are receiving this medication. You may need blood work while taking this medication. This medication may cause serious skin reactions. They can happen weeks to months after starting the medication. Contact your care team right away if you notice fevers or flu-like symptoms with a rash. The rash may be red or purple and then turn into blisters or peeling of the skin. You may also notice a red rash with swelling of the face, lips, or  lymph nodes in your neck or under your arms. Tell your care team right away if you have any change in your eyesight. Talk to your care team if you may be pregnant. Serious birth defects can occur if you take this medication during pregnancy and for 4 months after the last dose. You will need a negative pregnancy test before starting this medication. Contraception is recommended while taking this medication and for 4 months after the last dose. Your care team can help you find the option that works for you. Do not breastfeed while taking this medication and for 4 months after the last dose. What side effects may I notice from receiving this medication? Side effects that you should report to your care team as soon as possible: Allergic reactions--skin rash, itching, hives, swelling of the face, lips, tongue, or throat Dry cough, shortness of breath or trouble breathing Eye pain, redness, irritation, or discharge with blurry or decreased vision Heart muscle inflammation--unusual weakness or fatigue, shortness of breath, chest pain, fast or irregular heartbeat, dizziness, swelling of the ankles, feet, or hands Hormone gland problems--headache, sensitivity to light, unusual weakness or fatigue, dizziness, fast or irregular heartbeat, increased sensitivity to cold or heat, excessive sweating, constipation, hair loss, increased thirst or amount of urine, tremors or shaking, irritability Infusion   reactions--chest pain, shortness of breath or trouble breathing, feeling faint or lightheaded Kidney injury (glomerulonephritis)--decrease in the amount of urine, red or dark brown urine, foamy or bubbly urine, swelling of the ankles, hands, or feet Liver injury--right upper belly pain, loss of appetite, nausea, light-colored stool, dark yellow or brown urine, yellowing skin or eyes, unusual weakness or fatigue Pain, tingling, or numbness in the hands or feet, muscle weakness, change in vision, confusion or trouble  speaking, loss of balance or coordination, trouble walking, seizures Rash, fever, and swollen lymph nodes Redness, blistering, peeling, or loosening of the skin, including inside the mouth Sudden or severe stomach pain, bloody diarrhea, fever, nausea, vomiting Side effects that usually do not require medical attention (report to your care team if they continue or are bothersome): Bone, joint, or muscle pain Diarrhea Fatigue Loss of appetite Nausea Skin rash This list may not describe all possible side effects. Call your doctor for medical advice about side effects. You may report side effects to FDA at 1-800-FDA-1088. Where should I keep my medication? This medication is given in a hospital or clinic. It will not be stored at home. NOTE: This sheet is a summary. It may not cover all possible information. If you have questions about this medicine, talk to your doctor, pharmacist, or health care provider.  2023 Elsevier/Gold Standard (2021-07-27 00:00:00)   

## 2021-12-24 ENCOUNTER — Other Ambulatory Visit (HOSPITAL_COMMUNITY): Payer: Self-pay

## 2021-12-24 LAB — T4: T4, Total: 6.7 ug/dL (ref 4.5–12.0)

## 2021-12-24 NOTE — Telephone Encounter (Addendum)
Oral Oncology Patient Advocate Encounter   Submitted application for assistance for Lenvima to [employer].   Application submitted via e-fax to [phone]  I will continue to check the status until final determination.   [name], CPhT-Adv Oncology Pharmacy Patient [place] Direct Number: [phone]  Fax: [phone]

## 2021-12-25 ENCOUNTER — Encounter: Payer: Self-pay | Admitting: Oncology

## 2021-12-25 NOTE — Telephone Encounter (Signed)
Oral Oncology Patient Advocate Encounter   Received notification that the application for assistance for Lenvima through [place] has been approved.   Scott Harding phone number [phone].   Effective dates: 12/25/2021 through 12/25/2022  I have spoken to the patient.  Scott Harding, CPhT-Adv Oncology Pharmacy Patient [place] Direct Number: [phone]  Fax: [phone]

## 2021-12-25 NOTE — Telephone Encounter (Signed)
Oral Oncology Patient Advocate Encounter  Received update via call from [facility] regarding patient assistance application for Lenvima.  Status is pending proof of [place] denial. Copy of appeal denial letter faxed to program [phone].   [place] phone [phone]  I will continue to check status until final determination.  [name], CPhT-Adv Oncology Pharmacy Patient [place] Direct Number: [phone]  Fax: [phone]

## 2021-12-28 NOTE — Progress Notes (Unsigned)
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  INTERVAL HISTORY: Patient returns to clinic today for repeat laboratory work and to assess his toleration of lenvatinib.  He tolerated his treatment well without significant side effects.  He feels that in just 1 week's time the mass in his neck has decreased in size.  He continues to have a mild peripheral neuropathy.  He has no other neurologic complaints.  He denies any pain today.  He denies any recent fevers or illnesses. He has no chest pain, shortness of breath, cough, or hemoptysis.  He denies any nausea, vomiting, constipation, or diarrhea.  He has no urinary complaints.  Patient offers no further specific complaints today.  REVIEW OF SYSTEMS:   Review of Systems  Constitutional: Negative.  Negative for fever, malaise/fatigue and weight loss.  Respiratory: Negative.  Negative for cough, hemoptysis and shortness of breath.   Cardiovascular: Negative.  Negative for chest pain and leg swelling.  Gastrointestinal: Negative.  Negative for abdominal pain.  Genitourinary: Negative.  Negative for dysuria.  Musculoskeletal: Negative.  Negative for neck pain.  Skin: Negative.  Negative for itching and rash.  Neurological:  Positive for tingling. Negative for dizziness, focal weakness, weakness and headaches.  Psychiatric/Behavioral: Negative.  The patient is not nervous/anxious.     As per HPI. Otherwise, a complete review of systems is negative.  PAST MEDICAL HISTORY: Past Medical History:  Diagnosis Date    Cancer [facility])    Coronary artery disease    Hypertension     PAST SURGICAL HISTORY: Past Surgical History:  Procedure Laterality Date   CORONARY ARTERY BYPASS GRAFT  2010   Quadruple   DIRECT LARYNGOSCOPY  11/28/2020   Procedure: DIRECT LARYNGOSCOPY WITH BIOPSY;  Surgeon: Beverly Gust, MD;  Location: [facility];  Service: ENT;;   IR IMAGING GUIDED PORT INSERTION  01/01/2021   PULMONARY THROMBECTOMY Bilateral 01/13/2021   Procedure: PULMONARY THROMBECTOMY;  Surgeon: Katha Cabal, MD;  Location: [facility];  Service: Cardiovascular;  Laterality: Bilateral;   TRACHEOSTOMY TUBE PLACEMENT N/A 11/28/2020   Procedure: AWAKE TRACHEOSTOMY;  Surgeon: Beverly Gust, MD;  Location: [facility];  Service: ENT;  Laterality: N/A;    FAMILY HISTORY: Family History  Problem Relation Age of Onset   Arthritis Mother    Heart attack Father    Brain cancer Sister     ADVANCED DIRECTIVES (Y/N):  N  HEALTH MAINTENANCE: Social History   Tobacco Use   Smoking status: Former    Types: Cigarettes    Quit date: 10/17/2008    Years since quitting: 13.2   Smokeless tobacco: Never  Vaping Use   Vaping Use: Never used  Substance Use Topics   Alcohol use: Yes    Comment: occasionally   Drug use: Never     Colonoscopy:  PAP:  Bone density:  Lipid panel:  No Known Allergies  Current Outpatient Medications  Medication Sig Dispense Refill   amLODipine (NORVASC) 10 MG tablet Take 10 mg by mouth daily.     apixaban (ELIQUIS) 5 MG TABS tablet Take 1 tablet (5 mg total) by mouth 2 (two) times daily. 60 tablet  3   ascorbic acid (VITAMIN C) 500 MG tablet Take by mouth.     cyanocobalamin 1000 MCG tablet Take by mouth.     DENTA 5000 PLUS 1.1 % CREA dental cream      gentamicin ointment (GARAMYCIN) 0.1 % Apply topically.     hydrALAZINE (APRESOLINE) 50 MG tablet Take 50 mg by mouth in the morning and at bedtime. 1.5 tab BID     lenvatinib 14 mg daily dose (LENVIMA, 14 MG DAILY DOSE,) 10  & 4 MG capsule Take 14 mg by mouth daily. 60 capsule 1   metoprolol succinate (TOPROL-XL) 25 MG 24 hr tablet Take 25 mg by mouth daily.     olmesartan (BENICAR) 40 MG tablet Take 40 mg by mouth daily.     prochlorperazine (COMPAZINE) 10 MG tablet Take 1 tablet (10 mg total) by mouth every 6 (six) hours as needed for nausea or vomiting. 30 tablet 2   rosuvastatin (CRESTOR) 40 MG tablet Take 40 mg by mouth daily.     ALPRAZolam (XANAX) 0.5 MG tablet Take 1 tablet (0.5 mg total) by mouth daily as needed for anxiety (Take 1 hour prior to radiation). (Patient not taking: Reported on 04/14/2021) 30 tablet 1   aspirin 81 MG chewable tablet Chew by mouth. (Patient not taking: Reported on 11/25/2021)     betamethasone valerate ointment (VALISONE) 0.1 % Apply 1 application. topically 2 (two) times daily. For up to 2 weeks (Patient not taking: Reported on 09/23/2021) 30 g 0   ipratropium-albuterol (DUONEB) 0.5-2.5 (3) MG/3ML SOLN Take 3 mLs by nebulization every 6 (six) hours. (Patient not taking: Reported on 03/24/2021) 360 mL 0   No current facility-administered medications for this visit.   Facility-Administered Medications Ordered in Other Visits  Medication Dose Route Frequency Provider Last Rate Last Admin   diphenhydrAMINE (BENADRYL) 50 MG/ML injection            famotidine (PEPCID) 20-0.9 MG/50ML-% IVPB            heparin lock flush 100 UNIT/ML injection            heparin lock flush 100 UNIT/ML injection            heparin lock flush 100 unit/mL  500 Units Intravenous Once Grayland Ormond, Kathlene November, MD       palonosetron (ALOXI) 0.25 MG/5ML injection            sodium chloride flush (NS) 0.9 % injection 10 mL  10 mL Intravenous PRN Lloyd Huger, MD   10 mL at 01/06/21 1305    OBJECTIVE: Vitals:   12/29/21 1020  BP: 115/66  Pulse: 66  Temp: (!) 97.4 F (36.3 C)      Body mass index is 30.42 kg/m.    ECOG FS:0 - Asymptomatic  General: Well-developed, well-nourished, no acute  distress. Eyes: Pink conjunctiva, anicteric sclera. HEENT: Normocephalic, moist mucous membranes.  Easily palpable bilateral neck mass. Lungs: No audible wheezing or coughing. Heart: Regular rate and rhythm. Abdomen: Soft, nontender, no obvious distention. Musculoskeletal: No edema, cyanosis, or clubbing. Neuro: Alert, answering all questions appropriately. Cranial nerves grossly intact. Skin: No rashes or petechiae noted. Psych: Normal affect.  LAB RESULTS:  Lab Results  Component Value Date   NA 140 12/29/2021   K 3.5 12/29/2021   CL 107 12/29/2021   CO2 25 12/29/2021   GLUCOSE 101 (H) 12/29/2021   BUN 27 (H) 12/29/2021   CREATININE 1.49 (H) 12/29/2021   CALCIUM 9.1 12/29/2021  PROT 7.9 12/29/2021   ALBUMIN 4.4 12/29/2021   AST 16 12/29/2021   ALT 11 12/29/2021   ALKPHOS 39 12/29/2021   BILITOT 0.5 12/29/2021   GFRNONAA 52 (L) 12/29/2021   GFRAA 37 (L) 11/13/2019    Lab Results  Component Value Date   WBC 8.1 12/29/2021   NEUTROABS 6.1 12/29/2021   HGB 12.2 (L) 12/29/2021   HCT 36.0 (L) 12/29/2021   MCV 94.2 12/29/2021   PLT 226 12/29/2021     STUDIES: No results found.  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  PLAN:    1.  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  Case discussed with [facility] head and neck physician, Dr. Ernst Bowler, and consensus was to restart chemotherapy with carboplatinum, Taxol, and Keytruda on a 21-day cycle.  Repeat PET scan reviewed independently revealed persistent malignancy.  Patient received 5 goals of treatment completing on November 25, 2021.  He has now transitioned to [facility] every [redacted] weeks along with lenvatinib.  He initially received 10 mg lenvatinib for the first week, but this has now been increased to 14 mg daily.  Continue treatment as  planned.  Return to clinic in 2 weeks for further evaluation and continuation of treatment.       2.  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  PET scan results from November 12, 2019 reviewed independently with no obvious evidence of recurrent or progressive disease.  Level 2 lymph node is no longer hypermetabolic.  3.  Renal insufficiency: Chronic and unchanged.  Patient's creatinine is 1.49 today. 4.  Anemia: Mildly improved to 12.2.   5.  Hypokalemia: Resolved. 6.  Pulmonary embolism: Diagnosed in September 2022.  Continue Eliquis as prescribed.  Patient will require a minimum of 6 months of treatment. 7.  Leukopenia: Resolved. 8.  Peripheral neuropathy: Chronic changes.  Possibly related to Taxol.  Discontinue Taxol as above.   9.  Trach: Patient was evaluated recently and it was determined not to remove his trach.  Patient has an appointment with [facility]NT on February 03, 2022. 10.  Rash: Resolved with steroid taper.  Patient expressed understanding and was in agreement with this plan. He also understands that He can call clinic at any time with any questions, concerns, or complaints.    Cancer Staging  Malignant neoplasm of supraglottis [facility]) Staging form: Larynx - Supraglottis, AJCC 8th Edition - Clinical stage from 12/11/2020: Stage IVC (cT3, cN2b, cM1) - Signed by Lloyd Huger, MD on 01/26/2021  Primary squamous cell carcinoma of tonsil ([place]) Staging form: Pharynx - HPV-Mediated Oropharynx, AJCC 8th Edition - Clinical: No stage assigned - Unsigned   Lloyd Huger, MD   12/29/2021 10:58 AM

## 2021-12-29 ENCOUNTER — Encounter: Payer: Self-pay | Admitting: Oncology

## 2021-12-29 ENCOUNTER — Inpatient Hospital Stay (HOSPITAL_BASED_OUTPATIENT_CLINIC_OR_DEPARTMENT_OTHER): Payer: Medicare Other | Admitting: Oncology

## 2021-12-29 ENCOUNTER — Inpatient Hospital Stay: Payer: Medicare Other | Admitting: Pharmacist

## 2021-12-29 ENCOUNTER — Inpatient Hospital Stay: Payer: Medicare Other

## 2021-12-29 VITALS — BP 115/66 | HR 66 | Temp 97.4°F | Wt 206.0 lb

## 2021-12-29 DIAGNOSIS — Z5112 Encounter for antineoplastic immunotherapy: Secondary | ICD-10-CM | POA: Diagnosis not present

## 2021-12-29 DIAGNOSIS — C321 Malignant neoplasm of supraglottis: Secondary | ICD-10-CM

## 2021-12-29 DIAGNOSIS — C099 Malignant neoplasm of tonsil, unspecified: Secondary | ICD-10-CM

## 2021-12-29 LAB — COMPREHENSIVE METABOLIC PANEL
ALT: 11 U/L (ref 0–44)
AST: 16 U/L (ref 15–41)
Albumin: 4.4 g/dL (ref 3.5–5.0)
Alkaline Phosphatase: 39 U/L (ref 38–126)
Anion gap: 8 (ref 5–15)
BUN: 27 mg/dL — ABNORMAL HIGH (ref 8–23)
CO2: 25 mmol/L (ref 22–32)
Calcium: 9.1 mg/dL (ref 8.9–10.3)
Chloride: 107 mmol/L (ref 98–111)
Creatinine, Ser: 1.49 mg/dL — ABNORMAL HIGH (ref 0.61–1.24)
GFR, Estimated: 52 mL/min — ABNORMAL LOW (ref >60)
Glucose, Bld: 101 mg/dL — ABNORMAL HIGH (ref 70–99)
Potassium: 3.5 mmol/L (ref 3.5–5.1)
Sodium: 140 mmol/L (ref 135–145)
Total Bilirubin: 0.5 mg/dL (ref 0.3–1.2)
Total Protein: 7.9 g/dL (ref 6.5–8.1)

## 2021-12-29 LAB — CBC WITH DIFFERENTIAL/PLATELET
Abs Immature Granulocytes: 0.02 10*3/uL (ref 0.00–0.07)
Basophils Absolute: 0 10*3/uL (ref 0.0–0.1)
Basophils Relative: 1 %
Eosinophils Absolute: 0.1 10*3/uL (ref 0.0–0.5)
Eosinophils Relative: 2 %
HCT: 36 % — ABNORMAL LOW (ref 39.0–52.0)
Hemoglobin: 12.2 g/dL — ABNORMAL LOW (ref 13.0–17.0)
Immature Granulocytes: 0 %
Lymphocytes Relative: 11 %
Lymphs Abs: 0.9 10*3/uL (ref 0.7–4.0)
MCH: 31.9 pg (ref 26.0–34.0)
MCHC: 33.9 g/dL (ref 30.0–36.0)
MCV: 94.2 fL (ref 80.0–100.0)
Monocytes Absolute: 0.9 10*3/uL (ref 0.1–1.0)
Monocytes Relative: 12 %
Neutro Abs: 6.1 10*3/uL (ref 1.7–7.7)
Neutrophils Relative %: 74 %
Platelets: 226 10*3/uL (ref 150–400)
RBC: 3.82 MIL/uL — ABNORMAL LOW (ref 4.22–5.81)
RDW: 13.6 % (ref 11.5–15.5)
WBC: 8.1 10*3/uL (ref 4.0–10.5)
nRBC: 0 % (ref 0.0–0.2)

## 2021-12-29 NOTE — Progress Notes (Signed)
[facility]  [phone] [phone]  Patient Care Team: Idelle Crouch, MD as PCP - General (Internal Medicine) Beverly Gust, MD (Otolaryngology) Lloyd Huger, MD as Consulting Physician (Oncology) Noreene Filbert, MD as Referring Physician (Radiation Oncology)   Name of the patient: Scott Harding  [id number]  03-04-1956   Date of visit: 12/29/21  HPI: Patient is a 66 y.o. 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 He started treatment with Lenvima on 12/23/21. This was added onto his ongoing pembrolizumab therapy.   Reason for Consult: Oral chemotherapy follow-up for lenvatinib therapy.   PAST MEDICAL HISTORY: Past Medical History:  Diagnosis Date   Cancer [facility])    Coronary artery disease    Hypertension     HEMATOLOGY/ONCOLOGY HISTORY:  Oncology History  Primary squamous cell carcinoma of tonsil ([place])  09/15/2018 Initial Diagnosis   Squamous cell carcinoma of left tonsil ([place])   10/04/2018 - 11/22/2018 Chemotherapy         Malignant neoplasm of supraglottis ([place])  12/11/2020 Initial Diagnosis   Malignant neoplasm of supraglottis ([place])   12/11/2020 Cancer Staging   Staging form: Larynx - Supraglottis, AJCC 8th Edition - Clinical stage from 12/11/2020: Stage IVC (cT3, cN2b, cM1) - Signed by Lloyd Huger, MD on 01/26/2021   12/30/2020 - 11/25/2021 Chemotherapy   Patient is on Treatment Plan : HEAD & NECK Carboplatin + Paclitaxel + Pembrolizumab q21d x 4 cycles / THEN Paclitaxel/Pembrolizumab Maintenance Q21D     08/20/2021 -  Chemotherapy   Patient is on Treatment Plan : Pembrolizumab (200) Maintenance D1 q21d       ALLERGIES:  has No Known Allergies.  MEDICATIONS:  Current Outpatient Medications  Medication Sig Dispense Refill   ALPRAZolam (XANAX) 0.5 MG tablet Take 1 tablet (0.5 mg total) by mouth daily as needed for anxiety (Take 1 hour prior to  radiation). (Patient not taking: Reported on 04/14/2021) 30 tablet 1   amLODipine (NORVASC) 10 MG tablet Take 10 mg by mouth daily.     apixaban (ELIQUIS) 5 MG TABS tablet Take 1 tablet (5 mg total) by mouth 2 (two) times daily. 60 tablet 3   ascorbic acid (VITAMIN C) 500 MG tablet Take by mouth.     aspirin 81 MG chewable tablet Chew by mouth. (Patient not taking: Reported on 11/25/2021)     betamethasone valerate ointment (VALISONE) 0.1 % Apply 1 application. topically 2 (two) times daily. For up to 2 weeks (Patient not taking: Reported on 09/23/2021) 30 g 0   cyanocobalamin 1000 MCG tablet Take by mouth.     DENTA 5000 PLUS 1.1 % CREA dental cream      gentamicin ointment (GARAMYCIN) 0.1 % Apply topically.     hydrALAZINE (APRESOLINE) 50 MG tablet Take 50 mg by mouth in the morning and at bedtime. 1.5 tab BID     ipratropium-albuterol (DUONEB) 0.5-2.5 (3) MG/3ML SOLN Take 3 mLs by nebulization every 6 (six) hours. (Patient not taking: Reported on 03/24/2021) 360 mL 0   lenvatinib 14 mg daily dose (LENVIMA, 14 MG DAILY DOSE,) 10 & 4 MG capsule Take 14 mg by mouth daily. 60 capsule 1   metoprolol succinate (TOPROL-XL) 25 MG 24 hr tablet Take 25 mg by mouth daily.     olmesartan (BENICAR) 40 MG tablet Take 40 mg by mouth daily.     prochlorperazine (COMPAZINE) 10 MG tablet Take 1 tablet (10 mg total) by mouth every 6 (six) hours as  needed for nausea or vomiting. 30 tablet 2   rosuvastatin (CRESTOR) 40 MG tablet Take 40 mg by mouth daily.     No current facility-administered medications for this visit.   Facility-Administered Medications Ordered in Other Visits  Medication Dose Route Frequency Provider Last Rate Last Admin   diphenhydrAMINE (BENADRYL) 50 MG/ML injection            famotidine (PEPCID) 20-0.9 MG/50ML-% IVPB            heparin lock flush 100 UNIT/ML injection            heparin lock flush 100 UNIT/ML injection            heparin lock flush 100 unit/mL  500 Units Intravenous Once  Grayland Ormond, Kathlene November, MD       palonosetron (ALOXI) 0.25 MG/5ML injection            sodium chloride flush (NS) 0.9 % injection 10 mL  10 mL Intravenous PRN Lloyd Huger, MD   10 mL at 01/06/21 1305    VITAL SIGNS: There were no vitals taken for this visit. There were no vitals filed for this visit.  Estimated body mass index is 30.42 kg/m as calculated from the following:   Height as of 10/28/21: '5\' 9"'$  (1.753 m).   Weight as of an earlier encounter on 12/29/21: 93.4 kg (206 lb).  LABS: CBC:    Component Value Date/Time   WBC 8.1 12/29/2021 1009   HGB 12.2 (L) 12/29/2021 1009   HCT 36.0 (L) 12/29/2021 1009   PLT 226 12/29/2021 1009   MCV 94.2 12/29/2021 1009   NEUTROABS 6.1 12/29/2021 1009   LYMPHSABS 0.9 12/29/2021 1009   MONOABS 0.9 12/29/2021 1009   EOSABS 0.1 12/29/2021 1009   BASOSABS 0.0 12/29/2021 1009   Comprehensive Metabolic Panel:    Component Value Date/Time   NA 140 12/29/2021 1009   K 3.5 12/29/2021 1009   CL 107 12/29/2021 1009   CO2 25 12/29/2021 1009   BUN 27 (H) 12/29/2021 1009   CREATININE 1.49 (H) 12/29/2021 1009   GLUCOSE 101 (H) 12/29/2021 1009   CALCIUM 9.1 12/29/2021 1009   AST 16 12/29/2021 1009   ALT 11 12/29/2021 1009   ALKPHOS 39 12/29/2021 1009   BILITOT 0.5 12/29/2021 1009   PROT 7.9 12/29/2021 1009   ALBUMIN 4.4 12/29/2021 1009     Present during today's visit: patient only  Assessment and Plan: CBC/CMP reviewed, continue lenvatinib '10mg'$  daily, when his mfg assistance medication is delivered this week, he will increase to lenvatinib '14mg'$  daily  Patient reported feeling like his neck was less swollen   Oral Chemotherapy Side Effect/Intolerance:  No reported hypertension, diarrhea, worsening fatigue, nausea, mouth sores, and hand-foot syndrome  Oral Chemotherapy Adherence: no missed doses reported No patient barriers to medication adherence identified.   New medications: none reported  Medication Access Issues: No issues,  patient approved for mfg until 12/25/22  Patient expressed understanding and was in agreement with this plan. He also understands that He can call clinic at any time with any questions, concerns, or complaints.   Follow-up plan: RTC in 2 weeks  Thank you for allowing me to participate in the care of this very pleasant patient.   Time Total: 15 mins  Visit consisted of counseling and education on dealing with issues of symptom management in the setting of serious and potentially life-threatening illness.Greater than 50%  of this time was spent counseling and coordinating care related  to the above assessment and plan.  Signed by: Darl Pikes, PharmD, BCPS, Salley Slaughter, CPP Hematology/Oncology Clinical Pharmacist Practitioner [place]/DB/AP [facility] [phone]  12/29/2021 2:50 PM

## 2022-01-01 ENCOUNTER — Telehealth: Payer: Self-pay | Admitting: Pharmacist

## 2022-01-01 NOTE — Telephone Encounter (Signed)
Oral Chemotherapy Pharmacist Encounter   Patient called to request Magic Mouthwash. Patient reported that he was having some soreness in the throat area. Rx with Magic Mouthwash was faxed to his local pharmacy. Instructed patient to swish and swallow since his area on soreness was in his throat. Asked patient to call clinic if mouthwash does not help or he feels worse. Patient stated his understanding.    Darl Pikes, PharmD, BCPS, BCOP, CPP Hematology/Oncology Clinical Pharmacist [place]/DB/AP [facility] [phone]  01/01/2022 2:21 PM

## 2022-01-06 ENCOUNTER — Ambulatory Visit: Payer: Medicare Other | Admitting: Oncology

## 2022-01-06 ENCOUNTER — Other Ambulatory Visit: Payer: Medicare Other

## 2022-01-06 ENCOUNTER — Ambulatory Visit: Payer: Medicare Other

## 2022-01-08 NOTE — Progress Notes (Signed)
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  INTERVAL HISTORY: Patient returns to clinic today for further evaluation and continuation of lenvatinib and Keytruda.  Over the past several days he has noted increasing difficulty breathing, swelling around his trach as well as excoriation and drainage.  He is having difficulty speaking.  He denies any fevers, but generally feels terrible.  He discontinued lenvatinib approximately 2 days ago.  He continues to have a mild peripheral neuropathy.  He has no other neurologic complaints.  He denies any pain today.  He has no chest pain, shortness of breath, cough, or hemoptysis.  He denies any nausea, vomiting, constipation, or diarrhea.  He has no urinary complaints.  Patient offers no further specific complaints today.  REVIEW OF SYSTEMS:   Review of Systems  Constitutional:  Positive for malaise/fatigue. Negative for fever and weight loss.  Respiratory: Negative.  Negative for cough, hemoptysis and shortness of breath.   Cardiovascular: Negative.  Negative for chest pain and leg swelling.  Gastrointestinal: Negative.  Negative for abdominal pain.  Genitourinary: Negative.  Negative for dysuria.  Musculoskeletal: Negative.  Negative for neck pain.  Skin: Negative.  Negative for itching and rash.  Neurological:  Positive for tingling and weakness. Negative for dizziness, focal weakness and headaches.  Psychiatric/Behavioral: Negative.  The patient is not nervous/anxious.     As per HPI. Otherwise, a complete review  of systems is negative.  PAST MEDICAL HISTORY: Past Medical History:  Diagnosis Date   Cancer [facility])    Coronary artery disease    Hypertension     PAST SURGICAL HISTORY: Past Surgical History:  Procedure Laterality Date   CORONARY ARTERY BYPASS GRAFT  2010   Quadruple   DIRECT LARYNGOSCOPY  11/28/2020   Procedure: DIRECT LARYNGOSCOPY WITH BIOPSY;  Surgeon: Beverly Gust, MD;  Location: [facility] ORS;  Service: ENT;;   IR IMAGING GUIDED PORT INSERTION  01/01/2021   PULMONARY THROMBECTOMY Bilateral 01/13/2021   Procedure: PULMONARY THROMBECTOMY;  Surgeon: Katha Cabal, MD;  Location: [place] CV [facility];  Service: Cardiovascular;  Laterality: Bilateral;   TRACHEOSTOMY TUBE PLACEMENT N/A 11/28/2020   Procedure: AWAKE TRACHEOSTOMY;  Surgeon: Beverly Gust, MD;  Location: [facility] ORS;  Service: ENT;  Laterality: N/A;    FAMILY HISTORY: Family History  Problem Relation Age of Onset   Arthritis Mother    Heart attack Father    Brain cancer Sister     ADVANCED DIRECTIVES (Y/N):  N  HEALTH MAINTENANCE: Social History   Tobacco Use   Smoking status: Former    Types: Cigarettes    Quit date: 10/17/2008    Years since quitting: 13.2   Smokeless tobacco: Never  Vaping Use   Vaping Use: Never used  Substance Use Topics   Alcohol use: Yes    Comment: occasionally   Drug use: Never     Colonoscopy:  PAP:  Bone density:  Lipid panel:  No Known Allergies  Current Outpatient Medications  Medication Sig Dispense Refill   metoprolol succinate (TOPROL-XL) 25 MG 24 hr tablet Take 25 mg by mouth daily.     olmesartan (BENICAR) 40 MG tablet Take 40  mg by mouth daily.     prochlorperazine (COMPAZINE) 10 MG tablet Take 1 tablet (10 mg total) by mouth every 6 (six) hours as needed for nausea or vomiting. 30 tablet 2   rosuvastatin (CRESTOR) 40 MG tablet Take 40 mg by mouth daily.     ALPRAZolam (XANAX) 0.5 MG tablet Take 1 tablet (0.5 mg total) by mouth daily as needed for anxiety  (Take 1 hour prior to radiation). (Patient not taking: Reported on 04/14/2021) 30 tablet 1   amLODipine (NORVASC) 10 MG tablet Take 10 mg by mouth daily.     apixaban (ELIQUIS) 5 MG TABS tablet Take 1 tablet (5 mg total) by mouth 2 (two) times daily. 60 tablet 3   ascorbic acid (VITAMIN C) 500 MG tablet Take by mouth.     aspirin 81 MG chewable tablet Chew by mouth. (Patient not taking: Reported on 11/25/2021)     betamethasone valerate ointment (VALISONE) 0.1 % Apply 1 application. topically 2 (two) times daily. For up to 2 weeks (Patient not taking: Reported on 09/23/2021) 30 g 0   cyanocobalamin 1000 MCG tablet Take by mouth.     DENTA 5000 PLUS 1.1 % CREA dental cream      gentamicin ointment (GARAMYCIN) 0.1 % Apply topically.     hydrALAZINE (APRESOLINE) 50 MG tablet Take 50 mg by mouth in the morning and at bedtime. 1.5 tab BID     ipratropium-albuterol (DUONEB) 0.5-2.5 (3) MG/3ML SOLN Take 3 mLs by nebulization every 6 (six) hours. (Patient not taking: Reported on 03/24/2021) 360 mL 0   lenvatinib 14 mg daily dose (LENVIMA, 14 MG DAILY DOSE,) 10 & 4 MG capsule Take 14 mg by mouth daily. (Patient not taking: Reported on 01/13/2022) 60 capsule 1   No current facility-administered medications for this visit.   Facility-Administered Medications Ordered in Other Visits  Medication Dose Route Frequency Provider Last Rate Last Admin   diphenhydrAMINE (BENADRYL) 50 MG/ML injection            famotidine (PEPCID) 20-0.9 MG/50ML-% IVPB            heparin lock flush 100 UNIT/ML injection            heparin lock flush 100 UNIT/ML injection            heparin lock flush 100 unit/mL  500 Units Intravenous Once Grayland Ormond, Kathlene November, MD       palonosetron (ALOXI) 0.25 MG/5ML injection            sodium chloride flush (NS) 0.9 % injection 10 mL  10 mL Intravenous PRN Lloyd Huger, MD   10 mL at 01/06/21 1305    OBJECTIVE: Vitals:   01/13/22 0833  BP: 126/75  Pulse: (!) 102  Temp: 99 F (37.2 C)       There is no height or weight on file to calculate BMI.    ECOG FS:0 - Asymptomatic  General: Well-developed, well-nourished, no acute distress. Eyes: Pink conjunctiva, anicteric sclera. HEENT: Erythema and drainage noted around trach. Lungs: No audible wheezing or coughing. Heart: Regular rate and rhythm. Abdomen: Soft, nontender, no obvious distention. Musculoskeletal: No edema, cyanosis, or clubbing. Neuro: Alert, answering all questions appropriately. Cranial nerves grossly intact. Skin: No rashes or petechiae noted. Psych: Normal affect.   LAB RESULTS:  Lab Results  Component Value Date   NA 140 12/29/2021   K 3.5 12/29/2021   CL 107 12/29/2021   CO2 25 12/29/2021   GLUCOSE 101 (H)  12/29/2021   BUN 27 (H) 12/29/2021   CREATININE 1.49 (H) 12/29/2021   CALCIUM 9.1 12/29/2021   PROT 7.9 12/29/2021   ALBUMIN 4.4 12/29/2021   AST 16 12/29/2021   ALT 11 12/29/2021   ALKPHOS 39 12/29/2021   BILITOT 0.5 12/29/2021   GFRNONAA 52 (L) 12/29/2021   GFRAA 37 (L) 11/13/2019    Lab Results  Component Value Date   WBC 8.1 12/29/2021   NEUTROABS 6.1 12/29/2021   HGB 12.2 (L) 12/29/2021   HCT 36.0 (L) 12/29/2021   MCV 94.2 12/29/2021   PLT 226 12/29/2021     STUDIES: No results found.  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  PLAN:    1.  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  Case discussed with [facility] head and neck physician, Dr. Ernst Bowler, and consensus was to restart chemotherapy with carboplatinum, Taxol, and Keytruda on a 21-day cycle.  Repeat PET scan reviewed independently revealed persistent malignancy.  Patient received 5 cycles of treatment completing on November 25, 2021.  He has now transitioned to [facility] every [redacted] weeks along with lenvatinib.  He initially received 10  mg lenvatinib for the first week, but this has now been increased to 14 mg daily.  Treatment is currently on hold given what appears to be an infected trach and patient was emergently seen by ENT.  Return to clinic next week for further evaluation and discussion on whether or not to continue treatment.      2.  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  PET scan results from November 12, 2019 reviewed independently with no obvious evidence of recurrent or progressive disease.  Level 2 lymph node is no longer hypermetabolic.  3.  Renal insufficiency: No labs are drawn today.   4.  Anemia: No labs are drawn today. 5.  Hypokalemia: Resolved. 6.  Pulmonary embolism: Diagnosed in September 2022.  Continue Eliquis as prescribed.  Patient will require a minimum of 6 months of treatment. 7.  Leukopenia: Resolved. 8.  Peripheral neuropathy: Chronic changes.  Possibly related to Taxol.  Discontinue Taxol as above.   9.  Trach: Patient was evaluated recently and it was determined not to remove his trach.  Patient has an appointment with [facility] ENT on February 03, 2022.  Appreciate ENT input today.  Patient emergently seen by them who placed patient on antibiotics recommended possible trach change. 10.  Rash: Resolved with steroid taper.  Patient expressed understanding and was in agreement with this plan. He also understands that He can call clinic at any time with any questions, concerns, or complaints.    Cancer Staging  Malignant neoplasm of supraglottis [facility]) Staging form: Larynx - Supraglottis, AJCC 8th Edition - Clinical stage from 12/11/2020: Stage IVC (cT3, cN2b, cM1) - Signed by Lloyd Huger, MD on 01/26/2021  Primary squamous cell carcinoma of tonsil ([place]) Staging form: Pharynx - HPV-Mediated Oropharynx, AJCC 8th Edition - Clinical: No stage assigned - Unsigned   Lloyd Huger, MD   01/15/2022 6:37  AM

## 2022-01-13 ENCOUNTER — Inpatient Hospital Stay (HOSPITAL_BASED_OUTPATIENT_CLINIC_OR_DEPARTMENT_OTHER): Payer: Medicare Other | Admitting: Oncology

## 2022-01-13 ENCOUNTER — Encounter: Payer: Self-pay | Admitting: Oncology

## 2022-01-13 ENCOUNTER — Inpatient Hospital Stay: Payer: Medicare Other

## 2022-01-13 VITALS — BP 126/75 | HR 102 | Temp 99.0°F

## 2022-01-13 DIAGNOSIS — C321 Malignant neoplasm of supraglottis: Secondary | ICD-10-CM

## 2022-01-13 DIAGNOSIS — Z5112 Encounter for antineoplastic immunotherapy: Secondary | ICD-10-CM | POA: Diagnosis not present

## 2022-01-14 ENCOUNTER — Telehealth: Payer: Self-pay | Admitting: *Deleted

## 2022-01-14 NOTE — Telephone Encounter (Signed)
Daughter Estill Bamberg [facility]) called requesting a return call to discuss the status of patient chemotherapy treatment.

## 2022-01-14 NOTE — Telephone Encounter (Signed)
Call returned to [facility] and informed of physician response, she is aware of visit 10/5

## 2022-01-15 ENCOUNTER — Encounter: Payer: Self-pay | Admitting: Oncology

## 2022-01-19 NOTE — Progress Notes (Unsigned)
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  INTERVAL HISTORY: Patient returns to clinic today for repeat laboratory work and further evaluation.  He was placed on antibiotics by ENT last week with improvement of his neck/trach infection.  He still has increased swelling and difficulty speaking, but overall feels improved.  He has occasional difficulty breathing.  He continues to have a mild peripheral neuropathy.  He has no other neurologic complaints.  He denies any pain today.  He has no chest pain, shortness of breath, cough, or hemoptysis.  He denies any nausea, vomiting, constipation, or diarrhea.  He has no urinary complaints.  Patient offers no further specific complaints today.  REVIEW OF SYSTEMS:   Review of Systems  Constitutional:  Positive for malaise/fatigue. Negative for fever and weight loss.  Respiratory: Negative.  Negative for cough, hemoptysis and shortness of breath.   Cardiovascular: Negative.  Negative for chest pain and leg swelling.  Gastrointestinal: Negative.  Negative for abdominal pain.  Genitourinary: Negative.  Negative for dysuria.  Musculoskeletal: Negative.  Negative for neck pain.  Skin: Negative.  Negative for itching and rash.  Neurological:  Positive for tingling and weakness. Negative for dizziness, focal weakness and headaches.  Psychiatric/Behavioral: Negative.  The patient is not nervous/anxious.     As per HPI. Otherwise, a complete review of systems is negative.  PAST MEDICAL HISTORY: Past Medical History:   Diagnosis Date   Cancer [facility])    Coronary artery disease    Hypertension     PAST SURGICAL HISTORY: Past Surgical History:  Procedure Laterality Date   CORONARY ARTERY BYPASS GRAFT  2010   Quadruple   DIRECT LARYNGOSCOPY  11/28/2020   Procedure: DIRECT LARYNGOSCOPY WITH BIOPSY;  Surgeon: Beverly Gust, MD;  Location: [facility] ORS;  Service: ENT;;   IR IMAGING GUIDED PORT INSERTION  01/01/2021   PULMONARY THROMBECTOMY Bilateral 01/13/2021   Procedure: PULMONARY THROMBECTOMY;  Surgeon: Katha Cabal, MD;  Location: [place] CV LAB;  Service: Cardiovascular;  Laterality: Bilateral;   TRACHEOSTOMY TUBE PLACEMENT N/A 11/28/2020   Procedure: AWAKE TRACHEOSTOMY;  Surgeon: Beverly Gust, MD;  Location: [facility] ORS;  Service: ENT;  Laterality: N/A;    FAMILY HISTORY: Family History  Problem Relation Age of Onset   Arthritis Mother    Heart attack Father    Brain cancer Sister     ADVANCED DIRECTIVES (Y/N):  N  HEALTH MAINTENANCE: Social History   Tobacco Use   Smoking status: Former    Types: Cigarettes    Quit date: 10/17/2008    Years since quitting: 13.2   Smokeless tobacco: Never  Vaping Use   Vaping Use: Never used  Substance Use Topics   Alcohol use: Yes    Comment: occasionally   Drug use: Never     Colonoscopy:  PAP:  Bone density:  Lipid panel:  No Known Allergies  Current Outpatient Medications  Medication Sig Dispense Refill   amLODipine (NORVASC) 10 MG tablet Take 10 mg by mouth daily.     apixaban (ELIQUIS) 5 MG TABS tablet Take 1 tablet (5 mg total) by mouth 2 (two) times daily. 60 tablet 3  ascorbic acid (VITAMIN C) 500 MG tablet Take by mouth.     cyanocobalamin 1000 MCG tablet Take by mouth.     DENTA 5000 PLUS 1.1 % CREA dental cream      gentamicin ointment (GARAMYCIN) 0.1 % Apply topically.     hydrALAZINE (APRESOLINE) 50 MG tablet Take 50 mg by mouth in the morning and at bedtime. 1.5 tab BID     metoprolol succinate (TOPROL-XL) 25 MG  24 hr tablet Take 25 mg by mouth daily.     olmesartan (BENICAR) 40 MG tablet Take 40 mg by mouth daily.     prochlorperazine (COMPAZINE) 10 MG tablet Take 1 tablet (10 mg total) by mouth every 6 (six) hours as needed for nausea or vomiting. 30 tablet 2   rosuvastatin (CRESTOR) 40 MG tablet Take 40 mg by mouth daily.     ALPRAZolam (XANAX) 0.5 MG tablet Take 1 tablet (0.5 mg total) by mouth daily as needed for anxiety (Take 1 hour prior to radiation). (Patient not taking: Reported on 04/14/2021) 30 tablet 1   aspirin 81 MG chewable tablet Chew by mouth. (Patient not taking: Reported on 11/25/2021)     betamethasone valerate ointment (VALISONE) 0.1 % Apply 1 application. topically 2 (two) times daily. For up to 2 weeks (Patient not taking: Reported on 09/23/2021) 30 g 0   clindamycin (CLEOCIN) 300 MG capsule Take 300 mg by mouth 3 (three) times daily.     ipratropium-albuterol (DUONEB) 0.5-2.5 (3) MG/3ML SOLN Take 3 mLs by nebulization every 6 (six) hours. (Patient not taking: Reported on 03/24/2021) 360 mL 0   lenvatinib 14 mg daily dose (LENVIMA, 14 MG DAILY DOSE,) 10 & 4 MG capsule Take 14 mg by mouth daily. (Patient not taking: Reported on 01/13/2022) 60 capsule 1   No current facility-administered medications for this visit.   Facility-Administered Medications Ordered in Other Visits  Medication Dose Route Frequency Provider Last Rate Last Admin   diphenhydrAMINE (BENADRYL) 50 MG/ML injection            famotidine (PEPCID) 20-0.9 MG/50ML-% IVPB            heparin lock flush 100 UNIT/ML injection            heparin lock flush 100 UNIT/ML injection            heparin lock flush 100 unit/mL  500 Units Intravenous Once Grayland Ormond, Kathlene November, MD       palonosetron (ALOXI) 0.25 MG/5ML injection            sodium chloride flush (NS) 0.9 % injection 10 mL  10 mL Intravenous PRN Lloyd Huger, MD   10 mL at 01/06/21 1305    OBJECTIVE: Vitals:   01/21/22 1013  BP: 120/83  Pulse: (!) 103  Temp:  99 F (37.2 C)      Body mass index is 30.13 kg/m.    ECOG FS:0 - Asymptomatic  General: Well-developed, well-nourished, no acute distress. Eyes: Pink conjunctiva, anicteric sclera. HEENT: Normocephalic, moist mucous membranes.  Trach in place easily palpable mass with increased swelling surrounding. Lungs: No audible wheezing or coughing. Heart: Regular rate and rhythm. Abdomen: Soft, nontender, no obvious distention. Musculoskeletal: No edema, cyanosis, or clubbing. Neuro: Alert, answering all questions appropriately. Cranial nerves grossly intact. Skin: No rashes or petechiae noted. Psych: Normal affect.  LAB RESULTS:  Lab Results  Component Value Date   NA 142 01/21/2022   K 2.8 (L) 01/21/2022   CL 107 01/21/2022  CO2 25 01/21/2022   GLUCOSE 94 01/21/2022   BUN 13 01/21/2022   CREATININE 1.18 01/21/2022   CALCIUM 9.1 01/21/2022   PROT 7.2 01/21/2022   ALBUMIN 3.7 01/21/2022   AST 17 01/21/2022   ALT 9 01/21/2022   ALKPHOS 38 01/21/2022   BILITOT 0.7 01/21/2022   GFRNONAA >60 01/21/2022   GFRAA 37 (L) 11/13/2019    Lab Results  Component Value Date   WBC 4.8 01/21/2022   NEUTROABS 3.2 01/21/2022   HGB 11.7 (L) 01/21/2022   HCT 35.3 (L) 01/21/2022   MCV 93.1 01/21/2022   PLT 280 01/21/2022     STUDIES: No results found.  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  PLAN:    1.  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  Case discussed with [facility] head and neck physician, Dr. Ernst Bowler, and consensus was to restart chemotherapy with carboplatinum, Taxol, and Keytruda on a 21-day cycle.  Repeat PET scan reviewed independently revealed persistent malignancy.  Patient received 5 cycles of treatment completing on November 25, 2021.  He has now transitioned to [facility] every [redacted] weeks  along with lenvatinib.  He initially received 10 mg lenvatinib for the first week, but this has now been increased to 14 mg daily.  Treatment is currently on hold.  He has been instructed to complete his antibiotic course and follow-up with ENT at [facility] next week on February 03, 2022.  Patient will follow-up on February 04, 2022 for further evaluation and discussion on whether or not to reinitiate treatment.  Hospice has also briefly been discussed, but patient is not ready to make this decision.      2.  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  PET scan results from November 12, 2019 reviewed independently with no obvious evidence of recurrent or progressive disease.  Level 2 lymph node is no longer hypermetabolic.  3.  Renal insufficiency: Resolved. 4.  Anemia: Current and unchanged.  Patient's hemoglobin is 11.7. 5.  Hypokalemia: Worse this week, continue oral supplementation. 6.  Pulmonary embolism: Diagnosed in September 2022.  Continue Eliquis as prescribed.  Patient will require a minimum of 6 months of treatment. 7.  Leukopenia: Resolved. 8.  Peripheral neuropathy: Can unchanged.  Possibly related to Taxol.  Discontinue Taxol as above.   9.  Trach: Patient was evaluated recently and it was determined not to remove his trach.  Improved with antibiotics.  Patient has an appointment with [facility] ENT on February 03, 2022.   10.  Rash: Resolved with steroid taper.  Patient expressed understanding and was in agreement with this plan. He also understands that He can call clinic at any time with any questions, concerns, or complaints.    Cancer Staging  Malignant neoplasm of supraglottis [facility]) Staging form: Larynx - Supraglottis, AJCC 8th Edition - Clinical stage from 12/11/2020: Stage IVC (cT3, cN2b, cM1) - Signed by Lloyd Huger, MD on 01/26/2021  Primary squamous cell carcinoma of tonsil ([place]) Staging form:  Pharynx - HPV-Mediated Oropharynx, AJCC 8th Edition - Clinical: No stage assigned - Unsigned   Lloyd Huger, MD   01/21/2022 4:37 PM

## 2022-01-21 ENCOUNTER — Inpatient Hospital Stay (HOSPITAL_BASED_OUTPATIENT_CLINIC_OR_DEPARTMENT_OTHER): Payer: Medicare Other | Admitting: Oncology

## 2022-01-21 ENCOUNTER — Encounter: Payer: Self-pay | Admitting: Oncology

## 2022-01-21 ENCOUNTER — Inpatient Hospital Stay: Payer: Medicare Other | Attending: Oncology

## 2022-01-21 VITALS — BP 120/83 | HR 103 | Temp 99.0°F | Wt 204.0 lb

## 2022-01-21 DIAGNOSIS — C321 Malignant neoplasm of supraglottis: Secondary | ICD-10-CM | POA: Diagnosis present

## 2022-01-21 DIAGNOSIS — I10 Essential (primary) hypertension: Secondary | ICD-10-CM | POA: Insufficient documentation

## 2022-01-21 DIAGNOSIS — R634 Abnormal weight loss: Secondary | ICD-10-CM | POA: Diagnosis not present

## 2022-01-21 DIAGNOSIS — D649 Anemia, unspecified: Secondary | ICD-10-CM | POA: Diagnosis not present

## 2022-01-21 DIAGNOSIS — E876 Hypokalemia: Secondary | ICD-10-CM | POA: Insufficient documentation

## 2022-01-21 DIAGNOSIS — Z5112 Encounter for antineoplastic immunotherapy: Secondary | ICD-10-CM | POA: Insufficient documentation

## 2022-01-21 DIAGNOSIS — Z7901 Long term (current) use of anticoagulants: Secondary | ICD-10-CM | POA: Diagnosis not present

## 2022-01-21 DIAGNOSIS — Z930 Tracheostomy status: Secondary | ICD-10-CM | POA: Insufficient documentation

## 2022-01-21 DIAGNOSIS — Z87891 Personal history of nicotine dependence: Secondary | ICD-10-CM | POA: Insufficient documentation

## 2022-01-21 DIAGNOSIS — I2699 Other pulmonary embolism without acute cor pulmonale: Secondary | ICD-10-CM | POA: Insufficient documentation

## 2022-01-21 DIAGNOSIS — R5383 Other fatigue: Secondary | ICD-10-CM | POA: Insufficient documentation

## 2022-01-21 DIAGNOSIS — G629 Polyneuropathy, unspecified: Secondary | ICD-10-CM | POA: Insufficient documentation

## 2022-01-21 LAB — CBC WITH DIFFERENTIAL/PLATELET
Abs Immature Granulocytes: 0.01 10*3/uL (ref 0.00–0.07)
Basophils Absolute: 0 10*3/uL (ref 0.0–0.1)
Basophils Relative: 0 %
Eosinophils Absolute: 0.2 10*3/uL (ref 0.0–0.5)
Eosinophils Relative: 5 %
HCT: 35.3 % — ABNORMAL LOW (ref 39.0–52.0)
Hemoglobin: 11.7 g/dL — ABNORMAL LOW (ref 13.0–17.0)
Immature Granulocytes: 0 %
Lymphocytes Relative: 15 %
Lymphs Abs: 0.7 10*3/uL (ref 0.7–4.0)
MCH: 30.9 pg (ref 26.0–34.0)
MCHC: 33.1 g/dL (ref 30.0–36.0)
MCV: 93.1 fL (ref 80.0–100.0)
Monocytes Absolute: 0.6 10*3/uL (ref 0.1–1.0)
Monocytes Relative: 12 %
Neutro Abs: 3.2 10*3/uL (ref 1.7–7.7)
Neutrophils Relative %: 68 %
Platelets: 280 10*3/uL (ref 150–400)
RBC: 3.79 MIL/uL — ABNORMAL LOW (ref 4.22–5.81)
RDW: 14.3 % (ref 11.5–15.5)
WBC: 4.8 10*3/uL (ref 4.0–10.5)
nRBC: 0 % (ref 0.0–0.2)

## 2022-01-21 LAB — COMPREHENSIVE METABOLIC PANEL
ALT: 9 U/L (ref 0–44)
AST: 17 U/L (ref 15–41)
Albumin: 3.7 g/dL (ref 3.5–5.0)
Alkaline Phosphatase: 38 U/L (ref 38–126)
Anion gap: 10 (ref 5–15)
BUN: 13 mg/dL (ref 8–23)
CO2: 25 mmol/L (ref 22–32)
Calcium: 9.1 mg/dL (ref 8.9–10.3)
Chloride: 107 mmol/L (ref 98–111)
Creatinine, Ser: 1.18 mg/dL (ref 0.61–1.24)
GFR, Estimated: >60 mL/min (ref >60)
Glucose, Bld: 94 mg/dL (ref 70–99)
Potassium: 2.8 mmol/L — ABNORMAL LOW (ref 3.5–5.1)
Sodium: 142 mmol/L (ref 135–145)
Total Bilirubin: 0.7 mg/dL (ref 0.3–1.2)
Total Protein: 7.2 g/dL (ref 6.5–8.1)

## 2022-01-27 ENCOUNTER — Ambulatory Visit: Payer: Medicare Other | Admitting: Oncology

## 2022-01-27 ENCOUNTER — Ambulatory Visit: Payer: Medicare Other

## 2022-01-27 ENCOUNTER — Other Ambulatory Visit: Payer: Medicare Other

## 2022-01-28 NOTE — Progress Notes (Signed)
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  INTERVAL HISTORY: Patient returns to clinic today for further evaluation and discussion on whether or not to reinitiate treatment.  He has now completed his course of antibiotics and is also at [facility] yesterday where he had his trach placed.  He feels significantly improved.  He continues to have increased neck swelling and difficulty speaking, but denies any further difficulty breathing.  He continues to have a mild peripheral neuropathy.  He has no other neurologic complaints.  He denies any pain today.  He has no chest pain, shortness of breath, cough, or hemoptysis.  He denies any nausea, vomiting, constipation, or diarrhea.  He has no urinary complaints.  Patient offers no further specific complaints today.  REVIEW OF SYSTEMS:   Review of Systems  Constitutional:  Positive for malaise/fatigue. Negative for fever and weight loss.  Respiratory: Negative.  Negative for cough, hemoptysis and shortness of breath.   Cardiovascular: Negative.  Negative for chest pain and leg swelling.  Gastrointestinal: Negative.  Negative for abdominal pain.  Genitourinary: Negative.  Negative for dysuria.  Musculoskeletal: Negative.  Negative for neck pain.  Skin: Negative.  Negative for itching and rash.  Neurological:  Positive for tingling and weakness. Negative for dizziness, focal weakness and headaches.  Psychiatric/Behavioral: Negative.  The patient is not nervous/anxious.     As per HPI. Otherwise, a complete review of  systems is negative.  PAST MEDICAL HISTORY: Past Medical History:  Diagnosis Date   Cancer [facility])    Coronary artery disease    Hypertension     PAST SURGICAL HISTORY: Past Surgical History:  Procedure Laterality Date   CORONARY ARTERY BYPASS GRAFT  2010   Quadruple   DIRECT LARYNGOSCOPY  11/28/2020   Procedure: DIRECT LARYNGOSCOPY WITH BIOPSY;  Surgeon: Beverly Gust, MD;  Location: [facility] ORS;  Service: ENT;;   IR IMAGING GUIDED PORT INSERTION  01/01/2021   PULMONARY THROMBECTOMY Bilateral 01/13/2021   Procedure: PULMONARY THROMBECTOMY;  Surgeon: Katha Cabal, MD;  Location: [place] CV LAB;  Service: Cardiovascular;  Laterality: Bilateral;   TRACHEOSTOMY TUBE PLACEMENT N/A 11/28/2020   Procedure: AWAKE TRACHEOSTOMY;  Surgeon: Beverly Gust, MD;  Location: [facility] ORS;  Service: ENT;  Laterality: N/A;    FAMILY HISTORY: Family History  Problem Relation Age of Onset   Arthritis Mother    Heart attack Father    Brain cancer Sister     ADVANCED DIRECTIVES (Y/N):  N  HEALTH MAINTENANCE: Social History   Tobacco Use   Smoking status: Former    Types: Cigarettes    Quit date: 10/17/2008    Years since quitting: 13.3   Smokeless tobacco: Never  Vaping Use   Vaping Use: Never used  Substance Use Topics   Alcohol use: Yes    Comment: occasionally   Drug use: Never     Colonoscopy:  PAP:  Bone density:  Lipid panel:  No Known Allergies  Current Outpatient Medications  Medication Sig Dispense Refill   amLODipine (NORVASC) 10 MG tablet Take 10 mg by mouth daily.     apixaban (ELIQUIS) 5 MG TABS tablet Take 1 tablet (  5 mg total) by mouth 2 (two) times daily. 60 tablet 3   ascorbic acid (VITAMIN C) 500 MG tablet Take by mouth.     clindamycin (CLEOCIN) 300 MG capsule Take 300 mg by mouth 3 (three) times daily.     cyanocobalamin 1000 MCG tablet Take by mouth.     DENTA 5000 PLUS 1.1 % CREA dental cream      gentamicin ointment (GARAMYCIN) 0.1 % Apply  topically.     hydrALAZINE (APRESOLINE) 50 MG tablet Take 50 mg by mouth in the morning and at bedtime. 1.5 tab BID     ipratropium-albuterol (DUONEB) 0.5-2.5 (3) MG/3ML SOLN Take 3 mLs by nebulization every 6 (six) hours. 360 mL 0   metoprolol succinate (TOPROL-XL) 25 MG 24 hr tablet Take 25 mg by mouth daily.     olmesartan (BENICAR) 40 MG tablet Take 40 mg by mouth daily.     predniSONE (DELTASONE) 5 MG tablet Take 1 tablet (5 mg total) by mouth daily with breakfast. 30 tablet 2   prochlorperazine (COMPAZINE) 10 MG tablet Take 1 tablet (10 mg total) by mouth every 6 (six) hours as needed for nausea or vomiting. 30 tablet 2   rosuvastatin (CRESTOR) 40 MG tablet Take 40 mg by mouth daily.     ALPRAZolam (XANAX) 0.5 MG tablet Take 1 tablet (0.5 mg total) by mouth daily as needed for anxiety (Take 1 hour prior to radiation). (Patient not taking: Reported on 04/14/2021) 30 tablet 1   aspirin 81 MG chewable tablet Chew by mouth. (Patient not taking: Reported on 11/25/2021)     betamethasone valerate ointment (VALISONE) 0.1 % Apply 1 application. topically 2 (two) times daily. For up to 2 weeks (Patient not taking: Reported on 09/23/2021) 30 g 0   lenvatinib 10 mg daily dose (LENVIMA, 10 MG DAILY DOSE,) capsule Take 1 capsule (10 mg total) by mouth daily. 30 capsule 3   traMADol (ULTRAM) 50 MG tablet Take 1 tablet (50 mg total) by mouth every 6 (six) hours as needed. 30 tablet 0   No current facility-administered medications for this visit.   Facility-Administered Medications Ordered in Other Visits  Medication Dose Route Frequency Provider Last Rate Last Admin   diphenhydrAMINE (BENADRYL) 50 MG/ML injection            famotidine (PEPCID) 20-0.9 MG/50ML-% IVPB            heparin lock flush 100 UNIT/ML injection            heparin lock flush 100 UNIT/ML injection            heparin lock flush 100 unit/mL  500 Units Intravenous Once Grayland Ormond, Kathlene November, MD       palonosetron (ALOXI) 0.25 MG/5ML  injection            sodium chloride flush (NS) 0.9 % injection 10 mL  10 mL Intravenous PRN Lloyd Huger, MD   10 mL at 01/06/21 1305    OBJECTIVE: Vitals:   02/04/22 0908  BP: 134/73  Pulse: 85  Temp: 99.7 F (37.6 C)  SpO2: 96%      Body mass index is 29.08 kg/m.    ECOG FS:0 - Asymptomatic  General: Well-developed, well-nourished, no acute distress. Eyes: Pink conjunctiva, anicteric sclera. HEENT: Normocephalic, moist mucous membranes. Lungs: No audible wheezing or coughing. Heart: Regular rate and rhythm. Abdomen: Soft, nontender, no obvious distention. Musculoskeletal: No edema, cyanosis, or clubbing. Neuro: Alert, answering all questions appropriately. Cranial nerves grossly  intact. Skin: No rashes or petechiae noted. Psych: Normal affect. Alert and  LAB RESULTS:  Lab Results  Component Value Date   NA 142 01/21/2022   K 2.8 (L) 01/21/2022   CL 107 01/21/2022   CO2 25 01/21/2022   GLUCOSE 94 01/21/2022   BUN 13 01/21/2022   CREATININE 1.18 01/21/2022   CALCIUM 9.1 01/21/2022   PROT 7.2 01/21/2022   ALBUMIN 3.7 01/21/2022   AST 17 01/21/2022   ALT 9 01/21/2022   ALKPHOS 38 01/21/2022   BILITOT 0.7 01/21/2022   GFRNONAA >60 01/21/2022   GFRAA 37 (L) 11/13/2019    Lab Results  Component Value Date   WBC 4.8 01/21/2022   NEUTROABS 3.2 01/21/2022   HGB 11.7 (L) 01/21/2022   HCT 35.3 (L) 01/21/2022   MCV 93.1 01/21/2022   PLT 280 01/21/2022     STUDIES: No results found.  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  PLAN:    1.  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  Patient received 5 cycles of carboplatinum, Taxol, and Keytruda treatment completing on November 25, 2021.  He has now transitioned to [facility] every [redacted] weeks along with lenvatinib.Marland Kitchen  He was  placed on hold secondary to infected trach.  Patient now feels improved and wishes to reinitiate treatment.  Patient expressed understanding that his treatment options are limited and hospice and comfort care have been discussed, but he does not wish to pursue that at this time.  Patient will initiate the lower dose of lenvatinib at 10 mg daily and return to clinic next week to reinitiate Keytruda.        2.  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  PET scan results from November 12, 2019 reviewed independently with no obvious evidence of recurrent or progressive disease.  Level 2 lymph node is no longer hypermetabolic.  3.  Renal insufficiency: Resolved. 4.  Anemia: Chronic and unchanged.  Patient's hemoglobin is 11.7. 5.  Hypokalemia: Continue oral supplementation. 6.  Pulmonary embolism: Diagnosed in September 2022.  Continue Eliquis as prescribed.  Patient will require a minimum of 6 months of treatment. 7.  Leukopenia: Resolved. 8.  Peripheral neuropathy: Can unchanged.  Possibly related to Taxol.  Discontinue Taxol as above.   9.  Trach: Patient seen at [facility] yesterday with placement of his trach.     Patient expressed understanding and was in agreement with this plan. He also understands that He can call clinic at any time with any questions, concerns, or complaints.    Cancer Staging  Malignant neoplasm of supraglottis [facility]) Staging form: Larynx - Supraglottis, AJCC 8th Edition - Clinical stage from 12/11/2020: Stage IVC (cT3, cN2b, cM1) - Signed by Lloyd Huger, MD on 01/26/2021  Primary squamous cell carcinoma of tonsil ([place]) Staging form: Pharynx - HPV-Mediated Oropharynx, AJCC 8th Edition - Clinical: No stage assigned - Unsigned   Lloyd Huger, MD   02/05/2022 6:28 AM

## 2022-02-03 ENCOUNTER — Ambulatory Visit: Payer: Medicare Other | Admitting: Oncology

## 2022-02-03 ENCOUNTER — Other Ambulatory Visit: Payer: Medicare Other

## 2022-02-03 ENCOUNTER — Ambulatory Visit: Payer: Medicare Other

## 2022-02-04 ENCOUNTER — Inpatient Hospital Stay (HOSPITAL_BASED_OUTPATIENT_CLINIC_OR_DEPARTMENT_OTHER): Payer: Medicare Other | Admitting: Hospice and Palliative Medicine

## 2022-02-04 ENCOUNTER — Inpatient Hospital Stay (HOSPITAL_BASED_OUTPATIENT_CLINIC_OR_DEPARTMENT_OTHER): Payer: Medicare Other | Admitting: Oncology

## 2022-02-04 ENCOUNTER — Encounter: Payer: Self-pay | Admitting: Oncology

## 2022-02-04 ENCOUNTER — Ambulatory Visit: Payer: Medicare Other

## 2022-02-04 ENCOUNTER — Other Ambulatory Visit: Payer: Medicare Other

## 2022-02-04 ENCOUNTER — Other Ambulatory Visit: Payer: Self-pay | Admitting: Pharmacist

## 2022-02-04 VITALS — BP 134/73 | HR 85 | Temp 99.7°F | Wt 196.9 lb

## 2022-02-04 DIAGNOSIS — C321 Malignant neoplasm of supraglottis: Secondary | ICD-10-CM | POA: Diagnosis not present

## 2022-02-04 DIAGNOSIS — Z515 Encounter for palliative care: Secondary | ICD-10-CM | POA: Diagnosis not present

## 2022-02-04 DIAGNOSIS — Z5112 Encounter for antineoplastic immunotherapy: Secondary | ICD-10-CM | POA: Diagnosis not present

## 2022-02-04 MED ORDER — LENVATINIB (10 MG DAILY DOSE) 10 MG PO CPPK: 10.0000 mg | ORAL_CAPSULE | Freq: Every day | ORAL | 3 refills | Status: AC

## 2022-02-04 MED ORDER — PREDNISONE 5 MG PO TABS: 5.0000 mg | ORAL_TABLET | Freq: Every day | ORAL | 2 refills | Status: AC

## 2022-02-04 MED ORDER — TRAMADOL HCL 50 MG PO TABS: 50.0000 mg | ORAL_TABLET | Freq: Four times a day (QID) | ORAL | 0 refills | Status: DC | PRN

## 2022-02-04 NOTE — Progress Notes (Signed)
[place] at [facility] [phone]) [phone] Fax:(336) [phone]   Name: Scott Harding Date: 02/04/2022 MRN: [id number]  DOB: 12/02/1955  Patient Care Team: Idelle Crouch, MD as PCP - General (Internal Medicine) Beverly Gust, MD (Otolaryngology) Lloyd Huger, MD as Consulting Physician (Oncology) Noreene Filbert, MD as Referring Physician (Radiation Oncology)    REASON FOR CONSULTATION: Scott Harding is a 66 y.o. 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  Patient is status post chemo with carboplatin, Taxol, Keytruda followed by maintenance Keytruda and lenvatinib.  However, treatment most recently has been on hold due to overall decline.  Palliative care was consulted to address goals.  SOCIAL HISTORY:     reports that he quit smoking about 13 years ago. His smoking use included cigarettes. He has never used smokeless tobacco. He reports current alcohol use. He reports that he does not use drugs.  Patient is unmarried.  He lives at home alone.  He has a daughter who is involved in his care.  ADVANCE DIRECTIVES:  Not on file  CODE STATUS:   PAST MEDICAL HISTORY: Past Medical History:  Diagnosis Date   Cancer ([place])    Coronary artery disease    Hypertension     PAST SURGICAL HISTORY:  Past Surgical History:  Procedure Laterality Date   CORONARY ARTERY BYPASS GRAFT  2010   Quadruple   DIRECT LARYNGOSCOPY  11/28/2020   Procedure: DIRECT LARYNGOSCOPY WITH BIOPSY;  Surgeon: Beverly Gust, MD;  Location: [facility] ORS;  Service: ENT;;   IR IMAGING GUIDED PORT INSERTION  01/01/2021   PULMONARY THROMBECTOMY Bilateral 01/13/2021   Procedure: PULMONARY THROMBECTOMY;  Surgeon: Katha Cabal, MD;  Location: [place] CV LAB;  Service: Cardiovascular;  Laterality: Bilateral;   TRACHEOSTOMY TUBE  PLACEMENT N/A 11/28/2020   Procedure: AWAKE TRACHEOSTOMY;  Surgeon: Beverly Gust, MD;  Location: [facility] ORS;  Service: ENT;  Laterality: N/A;    HEMATOLOGY/ONCOLOGY HISTORY:  Oncology History  Primary squamous cell carcinoma of tonsil ([place])  09/15/2018 Initial Diagnosis   Squamous cell carcinoma of left tonsil ([place])   10/04/2018 - 11/22/2018 Chemotherapy         Malignant neoplasm of supraglottis ([place])  12/11/2020 Initial Diagnosis   Malignant neoplasm of supraglottis ([place])   12/11/2020 Cancer Staging   Staging form: Larynx - Supraglottis, AJCC 8th Edition - Clinical stage from 12/11/2020: Stage IVC (cT3, cN2b, cM1) - Signed by Lloyd Huger, MD on 01/26/2021   12/30/2020 - 11/25/2021 Chemotherapy   Patient is on Treatment Plan : HEAD & NECK Carboplatin + Paclitaxel + Pembrolizumab q21d x 4 cycles / THEN Paclitaxel/Pembrolizumab Maintenance Q21D     08/20/2021 -  Chemotherapy   Patient is on Treatment Plan : Pembrolizumab (200) Maintenance D1 q21d       ALLERGIES:  has No Known Allergies.  MEDICATIONS:  Current Outpatient Medications  Medication Sig Dispense Refill   ALPRAZolam (XANAX) 0.5 MG tablet Take 1 tablet (0.5 mg total) by mouth daily as needed for anxiety (Take 1 hour prior to radiation). (Patient not taking: Reported on 04/14/2021) 30 tablet 1   amLODipine (NORVASC) 10 MG tablet Take 10 mg by mouth daily.     apixaban (ELIQUIS) 5 MG TABS tablet Take 1 tablet (5 mg total) by mouth 2 (two) times daily. 60 tablet 3   ascorbic acid (VITAMIN C) 500 MG tablet Take by mouth.  aspirin 81 MG chewable tablet Chew by mouth. (Patient not taking: Reported on 11/25/2021)     betamethasone valerate ointment (VALISONE) 0.1 % Apply 1 application. topically 2 (two) times daily. For up to 2 weeks (Patient not taking: Reported on 09/23/2021) 30 g 0   clindamycin (CLEOCIN) 300 MG capsule Take 300 mg by mouth 3 (three) times daily.     cyanocobalamin 1000 MCG tablet Take by mouth.     DENTA  5000 PLUS 1.1 % CREA dental cream      gentamicin ointment (GARAMYCIN) 0.1 % Apply topically.     hydrALAZINE (APRESOLINE) 50 MG tablet Take 50 mg by mouth in the morning and at bedtime. 1.5 tab BID     ipratropium-albuterol (DUONEB) 0.5-2.5 (3) MG/3ML SOLN Take 3 mLs by nebulization every 6 (six) hours. 360 mL 0   lenvatinib 14 mg daily dose (LENVIMA, 14 MG DAILY DOSE,) 10 & 4 MG capsule Take 14 mg by mouth daily. 60 capsule 1   metoprolol succinate (TOPROL-XL) 25 MG 24 hr tablet Take 25 mg by mouth daily.     olmesartan (BENICAR) 40 MG tablet Take 40 mg by mouth daily.     prochlorperazine (COMPAZINE) 10 MG tablet Take 1 tablet (10 mg total) by mouth every 6 (six) hours as needed for nausea or vomiting. 30 tablet 2   rosuvastatin (CRESTOR) 40 MG tablet Take 40 mg by mouth daily.     No current facility-administered medications for this visit.   Facility-Administered Medications Ordered in Other Visits  Medication Dose Route Frequency Provider Last Rate Last Admin   diphenhydrAMINE (BENADRYL) 50 MG/ML injection            famotidine (PEPCID) 20-0.9 MG/50ML-% IVPB            heparin lock flush 100 UNIT/ML injection            heparin lock flush 100 UNIT/ML injection            heparin lock flush 100 unit/mL  500 Units Intravenous Once Grayland Ormond, Kathlene November, MD       palonosetron (ALOXI) 0.25 MG/5ML injection            sodium chloride flush (NS) 0.9 % injection 10 mL  10 mL Intravenous PRN Lloyd Huger, MD   10 mL at 01/06/21 1305    VITAL SIGNS: There were no vitals taken for this visit. There were no vitals filed for this visit.  Estimated body mass index is 29.08 kg/m as calculated from the following:   Height as of 10/28/21: '5\' 9"'  (1.753 m).   Weight as of an earlier encounter on 02/04/22: 196 lb 14.4 oz (89.3 kg).  LABS: CBC:    Component Value Date/Time   WBC 4.8 01/21/2022 1002   HGB 11.7 (L) 01/21/2022 1002   HCT 35.3 (L) 01/21/2022 1002   PLT 280 01/21/2022 1002    MCV 93.1 01/21/2022 1002   NEUTROABS 3.2 01/21/2022 1002   LYMPHSABS 0.7 01/21/2022 1002   MONOABS 0.6 01/21/2022 1002   EOSABS 0.2 01/21/2022 1002   BASOSABS 0.0 01/21/2022 1002   Comprehensive Metabolic Panel:    Component Value Date/Time   NA 142 01/21/2022 1002   K 2.8 (L) 01/21/2022 1002   CL 107 01/21/2022 1002   CO2 25 01/21/2022 1002   BUN 13 01/21/2022 1002   CREATININE 1.18 01/21/2022 1002   GLUCOSE 94 01/21/2022 1002   CALCIUM 9.1 01/21/2022 1002   AST 17 01/21/2022 1002  ALT 9 01/21/2022 1002   ALKPHOS 38 01/21/2022 1002   BILITOT 0.7 01/21/2022 1002   PROT 7.2 01/21/2022 1002   ALBUMIN 3.7 01/21/2022 1002    RADIOGRAPHIC STUDIES: No results found.  PERFORMANCE STATUS (ECOG) : 2 - Symptomatic, <50% confined to bed  Review of Systems Unless otherwise noted, a complete review of systems is negative.  Physical Exam General: NAD HEENT: Trach intact, bilateral TMs clear without erythema/fluid Pulmonary: Unlabored Extremities: no edema, no joint deformities Skin: no rashes Neurological: Weakness but otherwise nonfocal  IMPRESSION: I met with patient and daughter following their visit with Dr. Grayland Ormond.  Patient is unable to communicate verbally given his tracheostomy.  He was able to nod and mouth words and was somewhat deferential to his daughter to communicate.  Patient and daughter have decided to proceed with additional chemotherapy/immunotherapy.  Probable plan is to restart Keytruda and lenvatinib.  However, they have been told that in the event of further progression or decline, patient will likely require hospice.  At baseline, patient lives at home alone and is still functionally independent per his daughter.  Symptomatically, patient has pain in the neck area, which radiates up to the right ear.  He is currently only taking Tylenol on occasion for pain.  However, that does not provide relief.  I suggested that we could start a low-dose opioid  elixir.  However, daughter was concerned that that could be too sedating.  She was in agreement with trial of tramadol.  Daughter says that ACP documents were previously completed at [facility].  I showed her a MOST form, which she said they had also completed.  However, we do not have any documents on file within [facility].  I would recommend completing a new MOST form.  Daughter says that patient had wanted to defer end-of-life decisions to the medical team.  PLAN: -Continue current scope of treatment -Start tramadol 50 mg every 6 hours as needed -Recommend completing ACP documents/MOST form -RTC 1 to 2 weeks   Patient expressed understanding and was in agreement with this plan. He also understands that He can call the clinic at any time with any questions, concerns, or complaints.     Time Total: 15 minutes  Visit consisted of counseling and education dealing with the complex and emotionally intense issues of symptom management and palliative care in the setting of serious and potentially life-threatening illness.Greater than 50%  of this time was spent counseling and coordinating care related to the above assessment and plan.  Signed by: Altha Harm, PhD, NP-C

## 2022-02-05 ENCOUNTER — Encounter: Payer: Self-pay | Admitting: Oncology

## 2022-02-11 ENCOUNTER — Inpatient Hospital Stay (HOSPITAL_BASED_OUTPATIENT_CLINIC_OR_DEPARTMENT_OTHER): Payer: Medicare Other | Admitting: Hospice and Palliative Medicine

## 2022-02-11 ENCOUNTER — Encounter: Payer: Self-pay | Admitting: Oncology

## 2022-02-11 ENCOUNTER — Inpatient Hospital Stay: Payer: Medicare Other

## 2022-02-11 ENCOUNTER — Inpatient Hospital Stay (HOSPITAL_BASED_OUTPATIENT_CLINIC_OR_DEPARTMENT_OTHER): Payer: Medicare Other | Admitting: Oncology

## 2022-02-11 ENCOUNTER — Other Ambulatory Visit: Payer: Self-pay | Admitting: Oncology

## 2022-02-11 VITALS — BP 136/73 | HR 69 | Temp 97.1°F | Resp 18 | Ht 69.0 in | Wt 192.0 lb

## 2022-02-11 DIAGNOSIS — C321 Malignant neoplasm of supraglottis: Secondary | ICD-10-CM

## 2022-02-11 DIAGNOSIS — G893 Neoplasm related pain (acute) (chronic): Secondary | ICD-10-CM

## 2022-02-11 DIAGNOSIS — Z515 Encounter for palliative care: Secondary | ICD-10-CM | POA: Diagnosis not present

## 2022-02-11 DIAGNOSIS — Z5112 Encounter for antineoplastic immunotherapy: Secondary | ICD-10-CM | POA: Diagnosis not present

## 2022-02-11 LAB — COMPREHENSIVE METABOLIC PANEL
ALT: 18 U/L (ref 0–44)
AST: 27 U/L (ref 15–41)
Albumin: 3.9 g/dL (ref 3.5–5.0)
Alkaline Phosphatase: 40 U/L (ref 38–126)
Anion gap: 10 (ref 5–15)
BUN: 14 mg/dL (ref 8–23)
CO2: 25 mmol/L (ref 22–32)
Calcium: 9.1 mg/dL (ref 8.9–10.3)
Chloride: 103 mmol/L (ref 98–111)
Creatinine, Ser: 1.04 mg/dL (ref 0.61–1.24)
GFR, Estimated: >60 mL/min (ref >60)
Glucose, Bld: 108 mg/dL — ABNORMAL HIGH (ref 70–99)
Potassium: 3.7 mmol/L (ref 3.5–5.1)
Sodium: 138 mmol/L (ref 135–145)
Total Bilirubin: 0.5 mg/dL (ref 0.3–1.2)
Total Protein: 8 g/dL (ref 6.5–8.1)

## 2022-02-11 LAB — CBC WITH DIFFERENTIAL/PLATELET
Abs Immature Granulocytes: 0.02 10*3/uL (ref 0.00–0.07)
Basophils Absolute: 0 10*3/uL (ref 0.0–0.1)
Basophils Relative: 1 %
Eosinophils Absolute: 0.1 10*3/uL (ref 0.0–0.5)
Eosinophils Relative: 1 %
HCT: 38 % — ABNORMAL LOW (ref 39.0–52.0)
Hemoglobin: 12.2 g/dL — ABNORMAL LOW (ref 13.0–17.0)
Immature Granulocytes: 0 %
Lymphocytes Relative: 6 %
Lymphs Abs: 0.5 10*3/uL — ABNORMAL LOW (ref 0.7–4.0)
MCH: 30.6 pg (ref 26.0–34.0)
MCHC: 32.1 g/dL (ref 30.0–36.0)
MCV: 95.2 fL (ref 80.0–100.0)
Monocytes Absolute: 0.4 10*3/uL (ref 0.1–1.0)
Monocytes Relative: 6 %
Neutro Abs: 6.7 10*3/uL (ref 1.7–7.7)
Neutrophils Relative %: 86 %
Platelets: 355 10*3/uL (ref 150–400)
RBC: 3.99 MIL/uL — ABNORMAL LOW (ref 4.22–5.81)
RDW: 14.6 % (ref 11.5–15.5)
WBC: 7.7 10*3/uL (ref 4.0–10.5)
nRBC: 0 % (ref 0.0–0.2)

## 2022-02-11 MED ORDER — HEPARIN SOD (PORK) LOCK FLUSH 100 UNIT/ML IV SOLN
500.0000 [IU] | Freq: Once | INTRAVENOUS | Status: AC | PRN
  Administered 2022-02-11: 500 [IU]
  Filled 2022-02-11: qty 5

## 2022-02-11 MED ORDER — SODIUM CHLORIDE 0.9 % IV SOLN
200.0000 mg | Freq: Once | INTRAVENOUS | Status: AC
  Administered 2022-02-11: 200 mg via INTRAVENOUS
  Filled 2022-02-11: qty 8

## 2022-02-11 MED ORDER — HYDROCODONE-ACETAMINOPHEN 5-325 MG PO TABS: 1.0000 | ORAL_TABLET | Freq: Four times a day (QID) | ORAL | 0 refills | Status: DC | PRN

## 2022-02-11 MED ORDER — SODIUM CHLORIDE 0.9 % IV SOLN
Freq: Once | INTRAVENOUS | Status: AC
  Filled 2022-02-11: qty 250

## 2022-02-11 NOTE — Progress Notes (Signed)
Nutrition Follow-up:  Patient added to schedule due to weight loss, recurrence  66 year old male with recurrent stage IV SCC supraglottis cancer.  S/p trach replacement in mid October at [facility] and recently infected but completed antibiotics course.  Patient received 5 cycles of carboplatinum, taxol and keytruda completed on 11/25/21.  restarted lenvatinib on 10/19 and restarting keytruda q 3 weeks today.    Met with patient and daughter in infusion today.  Patient and daughter reports that patient is having trouble swallowing foods due to mass.  Eating mostly mashed potatoes consistency type foods. Also able to eat green beans, cooked carrots, banana, chopped hamburger.  Patient is lactose intolerant.    Medications: reviewed  Labs: reviewed  Anthropometrics:   Weight 192 lb on 10/26 215 lb on 8/2  11% weight loss in the last 2 months, significant   Estimated Energy Needs  Kcals: 2175-2600 Protein: 108-130 g Fluid: 2175-2600 ml  NUTRITION DIAGNOSIS: Inadequate oral intake related to cancer as evidenced by 11% weight loss in the last 2 months and decreased oral intake   INTERVENTION:  Discussed [employer] brand products (lactose free) Samples of orgain and [employer] given for patient to try. Discussed option of chopping, grinding, pureeing foods in blender/nutribullet Discussed protein powders for added protein    MONITORING, EVALUATION, GOAL:  Weight trends, intake   NEXT VISIT: ~ 3 weeks with next treatment  Taliah Porche B. Zenia Resides, [place], [place] Registered Dietitian [phone]

## 2022-02-11 NOTE — Patient Instructions (Signed)
[facility] AT [place]  Discharge Instructions: Thank you for choosing [facility] to provide your oncology and hematology care.  If you have a lab appointment with the [facility], please go directly to the [facility] and check in at the registration area.  Wear comfortable clothing and clothing appropriate for easy access to any Portacath or PICC line.   We strive to give you quality time with your provider. You may need to reschedule your appointment if you arrive late (15 or more minutes).  Arriving late affects you and other patients whose appointments are after yours.  Also, if you miss three or more appointments without notifying the office, you may be dismissed from the clinic at the provider's discretion.      For prescription refill requests, have your pharmacy contact our office and allow 72 hours for refills to be completed.    Today you received the following chemotherapy and/or immunotherapy agent KEYTRUDA      To help prevent nausea and vomiting after your treatment, we encourage you to take your nausea medication as directed.  BELOW ARE SYMPTOMS THAT SHOULD BE REPORTED IMMEDIATELY: *FEVER GREATER THAN 100.4 F (38 C) OR HIGHER *CHILLS OR SWEATING *NAUSEA AND VOMITING THAT IS NOT CONTROLLED WITH YOUR NAUSEA MEDICATION *UNUSUAL SHORTNESS OF BREATH *UNUSUAL BRUISING OR BLEEDING *URINARY PROBLEMS (pain or burning when urinating, or frequent urination) *BOWEL PROBLEMS (unusual diarrhea, constipation, pain near the anus) TENDERNESS IN MOUTH AND THROAT WITH OR WITHOUT PRESENCE OF ULCERS (sore throat, sores in mouth, or a toothache) UNUSUAL RASH, SWELLING OR PAIN  UNUSUAL VAGINAL DISCHARGE OR ITCHING   Items with * indicate a potential emergency and should be followed up as soon as possible or go to the Emergency Department if any problems should occur.  Please show the CHEMOTHERAPY ALERT CARD or IMMUNOTHERAPY ALERT CARD at check-in to the  Emergency Department and triage nurse.  Should you have questions after your visit or need to cancel or reschedule your appointment, please contact [facility]CER [place] AT [place]  [phone] and follow the prompts.  Office hours are 8:00 a.m. to 4:30 p.m. Monday - Friday. Please note that voicemails left after 4:00 p.m. may not be returned until the following business day.  We are closed weekends and major holidays. You have access to a nurse at all times for urgent questions. Please call the main number to the clinic [phone] and follow the prompts.  For any non-urgent questions, you may also contact your provider using MyChart. We now offer e-Visits for anyone 66 and older to request care online for non-urgent symptoms. For details visit [email or web address].   Also download the MyChart app! Go to the app store, search "MyChart", open the app, select [place], and log in with your MyChart username and password.  Masks are optional in the cancer centers. If you would like for your care team to wear a mask while they are taking care of you, please let them know. For doctor visits, patients may have with them one support person who is at least 66 years old. At this time, visitors are not allowed in the infusion area.  Pembrolizumab Injection What is this medication? PEMBROLIZUMAB (PEM broe LIZ ue mab) treats some types of cancer. It works by helping your immune system slow or stop the spread of cancer cells. It is a monoclonal antibody. This medicine may be used for other purposes; ask your health care provider or pharmacist if you have questions.  COMMON BRAND NAME(S): Keytruda What should I tell my care team before I take this medication? They need to know if you have any of these conditions: Allogeneic stem cell transplant (uses someone else's stem cells) Autoimmune diseases, such as Crohn disease, ulcerative colitis, lupus History of chest radiation Nervous system  problems, such as Guillain-Barre syndrome, myasthenia gravis Organ transplant An unusual or allergic reaction to pembrolizumab, other medications, foods, dyes, or preservatives Pregnant or trying to get pregnant Breast-feeding How should I use this medication? This medication is injected into a vein. It is given by your care team in a hospital or clinic setting. A special MedGuide will be given to you before each treatment. Be sure to read this information carefully each time. Talk to your care team about the use of this medication in children. While it may be prescribed for children as young as 6 months for selected conditions, precautions do apply. Overdosage: If you think you have taken too much of this medicine contact a poison control center or emergency room at once. NOTE: This medicine is only for you. Do not share this medicine with others. What if I miss a dose? Keep appointments for follow-up doses. It is important not to miss your dose. Call your care team if you are unable to keep an appointment. What may interact with this medication? Interactions have not been studied. This list may not describe all possible interactions. Give your health care provider a list of all the medicines, herbs, non-prescription drugs, or dietary supplements you use. Also tell them if you smoke, drink alcohol, or use illegal drugs. Some items may interact with your medicine. What should I watch for while using this medication? Your condition will be monitored carefully while you are receiving this medication. You may need blood work while taking this medication. This medication may cause serious skin reactions. They can happen weeks to months after starting the medication. Contact your care team right away if you notice fevers or flu-like symptoms with a rash. The rash may be red or purple and then turn into blisters or peeling of the skin. You may also notice a red rash with swelling of the face, lips, or  lymph nodes in your neck or under your arms. Tell your care team right away if you have any change in your eyesight. Talk to your care team if you may be pregnant. Serious birth defects can occur if you take this medication during pregnancy and for 4 months after the last dose. You will need a negative pregnancy test before starting this medication. Contraception is recommended while taking this medication and for 4 months after the last dose. Your care team can help you find the option that works for you. Do not breastfeed while taking this medication and for 4 months after the last dose. What side effects may I notice from receiving this medication? Side effects that you should report to your care team as soon as possible: Allergic reactions--skin rash, itching, hives, swelling of the face, lips, tongue, or throat Dry cough, shortness of breath or trouble breathing Eye pain, redness, irritation, or discharge with blurry or decreased vision Heart muscle inflammation--unusual weakness or fatigue, shortness of breath, chest pain, fast or irregular heartbeat, dizziness, swelling of the ankles, feet, or hands Hormone gland problems--headache, sensitivity to light, unusual weakness or fatigue, dizziness, fast or irregular heartbeat, increased sensitivity to cold or heat, excessive sweating, constipation, hair loss, increased thirst or amount of urine, tremors or shaking, irritability Infusion  reactions--chest pain, shortness of breath or trouble breathing, feeling faint or lightheaded Kidney injury (glomerulonephritis)--decrease in the amount of urine, red or dark brown urine, foamy or bubbly urine, swelling of the ankles, hands, or feet Liver injury--right upper belly pain, loss of appetite, nausea, light-colored stool, dark yellow or brown urine, yellowing skin or eyes, unusual weakness or fatigue Pain, tingling, or numbness in the hands or feet, muscle weakness, change in vision, confusion or trouble  speaking, loss of balance or coordination, trouble walking, seizures Rash, fever, and swollen lymph nodes Redness, blistering, peeling, or loosening of the skin, including inside the mouth Sudden or severe stomach pain, bloody diarrhea, fever, nausea, vomiting Side effects that usually do not require medical attention (report to your care team if they continue or are bothersome): Bone, joint, or muscle pain Diarrhea Fatigue Loss of appetite Nausea Skin rash This list may not describe all possible side effects. Call your doctor for medical advice about side effects. You may report side effects to FDA at 1-800-FDA-1088. Where should I keep my medication? This medication is given in a hospital or clinic. It will not be stored at home. NOTE: This sheet is a summary. It may not cover all possible information. If you have questions about this medicine, talk to your doctor, pharmacist, or health care provider.  2023 Elsevier/Gold Standard (2012-12-25 00:00:00)

## 2022-02-11 NOTE — Progress Notes (Signed)
[place] at [facility] Telephone:([phone]) [phone] Fax:(336) [phone]   Name: Scott Harding Date: 02/11/2022 MRN: [id number]  DOB: [birth year]  Patient Care Team: Idelle Crouch, MD as PCP - General (Internal Medicine) Beverly Gust, MD (Otolaryngology) Lloyd Huger, MD as Consulting Physician (Oncology) Noreene Filbert, MD as Referring Physician (Radiation Oncology)    REASON FOR CONSULTATION: Scott Harding is a 66 y.o. 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  Patient is status post chemo with carboplatin, Taxol, Keytruda followed by maintenance Keytruda and lenvatinib.  However, treatment most recently has been on hold due to overall decline.  Palliative care was consulted to address goals.  SOCIAL HISTORY:     reports that he quit smoking about 13 years ago. His smoking use included cigarettes. He has never used smokeless tobacco. He reports current alcohol use. He reports that he does not use drugs.  Patient is unmarried.  He lives at home alone.  He has a daughter who is involved in his care.  ADVANCE DIRECTIVES:  Not on file  CODE STATUS:   PAST MEDICAL HISTORY: Past Medical History:  Diagnosis Date   Cancer ([place])    Coronary artery disease    Hypertension     PAST SURGICAL HISTORY:  Past Surgical History:  Procedure Laterality Date   CORONARY ARTERY BYPASS GRAFT  2010   Quadruple   DIRECT LARYNGOSCOPY  11/28/2020   Procedure: DIRECT LARYNGOSCOPY WITH BIOPSY;  Surgeon: Beverly Gust, MD;  Location: [facility] ORS;  Service: ENT;;   IR IMAGING GUIDED PORT INSERTION  01/01/2021   PULMONARY THROMBECTOMY Bilateral 01/13/2021   Procedure: PULMONARY THROMBECTOMY;  Surgeon: Katha Cabal, MD;  Location: [facility];  Service: Cardiovascular;  Laterality: Bilateral;   TRACHEOSTOMY TUBE  PLACEMENT N/A 11/28/2020   Procedure: AWAKE TRACHEOSTOMY;  Surgeon: Beverly Gust, MD;  Location: [facility] ORS;  Service: ENT;  Laterality: N/A;    HEMATOLOGY/ONCOLOGY HISTORY:  Oncology History  Primary squamous cell carcinoma of tonsil ([place])  09/15/2018 Initial Diagnosis   Squamous cell carcinoma of left tonsil ([place])   10/04/2018 - 11/22/2018 Chemotherapy         Malignant neoplasm of supraglottis ([place])  12/11/2020 Initial Diagnosis   Malignant neoplasm of supraglottis ([place])   12/11/2020 Cancer Staging   Staging form: Larynx - Supraglottis, AJCC 8th Edition - Clinical stage from 12/11/2020: Stage IVC (cT3, cN2b, cM1) - Signed by Lloyd Huger, MD on 01/26/2021   12/30/2020 - 11/25/2021 Chemotherapy   Patient is on Treatment Plan : HEAD & NECK Carboplatin + Paclitaxel + Pembrolizumab q21d x 4 cycles / THEN Paclitaxel/Pembrolizumab Maintenance Q21D     08/20/2021 -  Chemotherapy   Patient is on Treatment Plan : Pembrolizumab (200) Maintenance D1 q21d       ALLERGIES:  has No Known Allergies.  MEDICATIONS:  Current Outpatient Medications  Medication Sig Dispense Refill   HYDROcodone-acetaminophen (NORCO) 5-325 MG tablet Take 1 tablet by mouth every 6 (six) hours as needed for moderate pain. 30 tablet 0   ALPRAZolam (XANAX) 0.5 MG tablet Take 1 tablet (0.5 mg total) by mouth daily as needed for anxiety (Take 1 hour prior to radiation). (Patient not taking: Reported on 04/14/2021) 30 tablet 1   amLODipine (NORVASC) 10 MG tablet Take 10 mg by mouth daily.     apixaban (ELIQUIS) 5 MG TABS tablet Take 1 tablet (5 mg  total) by mouth 2 (two) times daily. 60 tablet 3   ascorbic acid (VITAMIN C) 500 MG tablet Take by mouth.     aspirin 81 MG chewable tablet Chew by mouth. (Patient not taking: Reported on 11/25/2021)     betamethasone valerate ointment (VALISONE) 0.1 % Apply 1 application. topically 2 (two) times daily. For up to 2 weeks (Patient not taking: Reported on 09/23/2021) 30 g 0    clindamycin (CLEOCIN) 300 MG capsule Take 300 mg by mouth 3 (three) times daily.     cyanocobalamin 1000 MCG tablet Take by mouth.     DENTA 5000 PLUS 1.1 % CREA dental cream      gentamicin ointment (GARAMYCIN) 0.1 % Apply topically.     hydrALAZINE (APRESOLINE) 50 MG tablet Take 50 mg by mouth in the morning and at bedtime. 1.5 tab BID     ipratropium-albuterol (DUONEB) 0.5-2.5 (3) MG/3ML SOLN Take 3 mLs by nebulization every 6 (six) hours. 360 mL 0   lenvatinib 10 mg daily dose (LENVIMA, 10 MG DAILY DOSE,) capsule Take 1 capsule (10 mg total) by mouth daily. 30 capsule 3   metoprolol succinate (TOPROL-XL) 25 MG 24 hr tablet Take 25 mg by mouth daily.     olmesartan (BENICAR) 40 MG tablet Take 40 mg by mouth daily.     predniSONE (DELTASONE) 5 MG tablet Take 1 tablet (5 mg total) by mouth daily with breakfast. 30 tablet 2   prochlorperazine (COMPAZINE) 10 MG tablet Take 1 tablet (10 mg total) by mouth every 6 (six) hours as needed for nausea or vomiting. 30 tablet 2   rosuvastatin (CRESTOR) 40 MG tablet Take 40 mg by mouth daily.     No current facility-administered medications for this visit.   Facility-Administered Medications Ordered in Other Visits  Medication Dose Route Frequency Provider Last Rate Last Admin   diphenhydrAMINE (BENADRYL) 50 MG/ML injection            famotidine (PEPCID) 20-0.9 MG/50ML-% IVPB            heparin lock flush 100 UNIT/ML injection            heparin lock flush 100 UNIT/ML injection            heparin lock flush 100 unit/mL  500 Units Intravenous Once Scott Ormond, Kathlene November, MD       palonosetron (ALOXI) 0.25 MG/5ML injection            sodium chloride flush (NS) 0.9 % injection 10 mL  10 mL Intravenous PRN Lloyd Huger, MD   10 mL at 01/06/21 1305    VITAL SIGNS: There were no vitals taken for this visit. There were no vitals filed for this visit.  Estimated body mass index is 28.35 kg/m as calculated from the following:   Height as of an earlier  encounter on 02/11/22: '5\' 9"'$  (1.753 m).   Weight as of an earlier encounter on 02/11/22: 192 lb (87.1 kg).  LABS: CBC:    Component Value Date/Time   WBC 7.7 02/11/2022 0911   HGB 12.2 (L) 02/11/2022 0911   HCT 38.0 (L) 02/11/2022 0911   PLT 355 02/11/2022 0911   MCV 95.2 02/11/2022 0911   NEUTROABS 6.7 02/11/2022 0911   LYMPHSABS 0.5 (L) 02/11/2022 0911   MONOABS 0.4 02/11/2022 0911   EOSABS 0.1 02/11/2022 0911   BASOSABS 0.0 02/11/2022 0911   Comprehensive Metabolic Panel:    Component Value Date/Time   NA 138 02/11/2022 0911  K 3.7 02/11/2022 0911   CL 103 02/11/2022 0911   CO2 25 02/11/2022 0911   BUN 14 02/11/2022 0911   CREATININE 1.04 02/11/2022 0911   GLUCOSE 108 (H) 02/11/2022 0911   CALCIUM 9.1 02/11/2022 0911   AST 27 02/11/2022 0911   ALT 18 02/11/2022 0911   ALKPHOS 40 02/11/2022 0911   BILITOT 0.5 02/11/2022 0911   PROT 8.0 02/11/2022 0911   ALBUMIN 3.9 02/11/2022 0911    RADIOGRAPHIC STUDIES: No results found.  PERFORMANCE STATUS (ECOG) : 2 - Symptomatic, <50% confined to bed  Review of Systems Unless otherwise noted, a complete review of systems is negative.  Physical Exam General: NAD HEENT: Trach intact, bilateral TMs clear without erythema/fluid Pulmonary: Unlabored Extremities: no edema, no joint deformities Skin: no rashes Neurological: Weakness but otherwise nonfocal  IMPRESSION: Patient seen in infusion.  He is accompanied by his daughter.  Patient reports that he is feeling improved since starting systemic treatment.  He feels like his neck mass is reducing in size.  No significant changes or concerns today.  Patient does endorse ongoing pain only partially improved with tramadol.  Daughter is interested in trial of Norco to see if that is more effective.  Rx sent to pharmacy.  I sent patient home with a MOST form to review.  PLAN: -Continue current scope of treatment -Rotate from tramadol to Norco -MOST Form reviewed -RTC 1 to  2 months  Case and plan discussed with Dr. Grayland Ormond  Patient expressed understanding and was in agreement with this plan. He also understands that He can call the clinic at any time with any questions, concerns, or complaints.     Time Total: 15 minutes  Visit consisted of counseling and education dealing with the complex and emotionally intense issues of symptom management and palliative care in the setting of serious and potentially life-threatening illness.Greater than 50%  of this time was spent counseling and coordinating care related to the above assessment and plan.  Signed by: Altha Harm, PhD, NP-C

## 2022-02-11 NOTE — Progress Notes (Signed)
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  INTERVAL HISTORY: Patient returns to clinic today for further evaluation and reinitiation of Keytruda.  He initiated lenvatinib last week and is tolerating treatment well and feels his neck mass has already decreased in size.  He continues to have difficulty speaking, but denies any further difficulty breathing.  His swallowing ability has improved as well, but not back to baseline.  He continues to have a mild peripheral neuropathy.  He has no other neurologic complaints.  He does admit to increased pain.  He has no chest pain, shortness of breath, cough, or hemoptysis.  He denies any nausea, vomiting, constipation, or diarrhea.  He has no urinary complaints.  Patient offers no further specific complaints today.  REVIEW OF SYSTEMS:   Review of Systems  Constitutional:  Positive for malaise/fatigue. Negative for fever and weight loss.  Respiratory: Negative.  Negative for cough, hemoptysis and shortness of breath.   Cardiovascular: Negative.  Negative for chest pain and leg swelling.  Gastrointestinal: Negative.  Negative for abdominal pain.  Genitourinary: Negative.  Negative for dysuria.  Musculoskeletal:  Positive for neck pain.  Skin: Negative.  Negative for itching and rash.  Neurological:  Positive for tingling and weakness. Negative for dizziness, focal weakness and headaches.  Psychiatric/Behavioral: Negative.  The patient is not nervous/anxious.     As per HPI. Otherwise, a complete review of systems is  negative.  PAST MEDICAL HISTORY: Past Medical History:  Diagnosis Date   Cancer [facility])    Coronary artery disease    Hypertension     PAST SURGICAL HISTORY: Past Surgical History:  Procedure Laterality Date   CORONARY ARTERY BYPASS GRAFT  2010   Quadruple   DIRECT LARYNGOSCOPY  11/28/2020   Procedure: DIRECT LARYNGOSCOPY WITH BIOPSY;  Surgeon: Beverly Gust, MD;  Location: [facility] ORS;  Service: ENT;;   IR IMAGING GUIDED PORT INSERTION  01/01/2021   PULMONARY THROMBECTOMY Bilateral 01/13/2021   Procedure: PULMONARY THROMBECTOMY;  Surgeon: Katha Cabal, MD;  Location: [place] CV LAB;  Service: Cardiovascular;  Laterality: Bilateral;   TRACHEOSTOMY TUBE PLACEMENT N/A 11/28/2020   Procedure: AWAKE TRACHEOSTOMY;  Surgeon: Beverly Gust, MD;  Location: [facility] ORS;  Service: ENT;  Laterality: N/A;    FAMILY HISTORY: Family History  Problem Relation Age of Onset   Arthritis Mother    Heart attack Father    Brain cancer Sister     ADVANCED DIRECTIVES (Y/N):  N  HEALTH MAINTENANCE: Social History   Tobacco Use   Smoking status: Former    Types: Cigarettes    Quit date: 10/17/2008    Years since quitting: 13.3   Smokeless tobacco: Never  Vaping Use   Vaping Use: Never used  Substance Use Topics   Alcohol use: Yes    Comment: occasionally   Drug use: Never     Colonoscopy:  PAP:  Bone density:  Lipid panel:  No Known Allergies  Current Outpatient Medications  Medication Sig Dispense Refill   amLODipine (NORVASC) 10 MG tablet Take 10 mg by mouth daily.     apixaban (ELIQUIS) 5 MG TABS tablet Take 1 tablet (5  mg total) by mouth 2 (two) times daily. 60 tablet 3   ascorbic acid (VITAMIN C) 500 MG tablet Take by mouth.     clindamycin (CLEOCIN) 300 MG capsule Take 300 mg by mouth 3 (three) times daily.     cyanocobalamin 1000 MCG tablet Take by mouth.     DENTA 5000 PLUS 1.1 % CREA dental cream      gentamicin ointment (GARAMYCIN) 0.1 % Apply topically.      hydrALAZINE (APRESOLINE) 50 MG tablet Take 50 mg by mouth in the morning and at bedtime. 1.5 tab BID     ipratropium-albuterol (DUONEB) 0.5-2.5 (3) MG/3ML SOLN Take 3 mLs by nebulization every 6 (six) hours. 360 mL 0   lenvatinib 10 mg daily dose (LENVIMA, 10 MG DAILY DOSE,) capsule Take 1 capsule (10 mg total) by mouth daily. 30 capsule 3   metoprolol succinate (TOPROL-XL) 25 MG 24 hr tablet Take 25 mg by mouth daily.     olmesartan (BENICAR) 40 MG tablet Take 40 mg by mouth daily.     predniSONE (DELTASONE) 5 MG tablet Take 1 tablet (5 mg total) by mouth daily with breakfast. 30 tablet 2   prochlorperazine (COMPAZINE) 10 MG tablet Take 1 tablet (10 mg total) by mouth every 6 (six) hours as needed for nausea or vomiting. 30 tablet 2   rosuvastatin (CRESTOR) 40 MG tablet Take 40 mg by mouth daily.     traMADol (ULTRAM) 50 MG tablet Take 1 tablet (50 mg total) by mouth every 6 (six) hours as needed. 30 tablet 0   ALPRAZolam (XANAX) 0.5 MG tablet Take 1 tablet (0.5 mg total) by mouth daily as needed for anxiety (Take 1 hour prior to radiation). (Patient not taking: Reported on 04/14/2021) 30 tablet 1   aspirin 81 MG chewable tablet Chew by mouth. (Patient not taking: Reported on 11/25/2021)     betamethasone valerate ointment (VALISONE) 0.1 % Apply 1 application. topically 2 (two) times daily. For up to 2 weeks (Patient not taking: Reported on 09/23/2021) 30 g 0   No current facility-administered medications for this visit.   Facility-Administered Medications Ordered in Other Visits  Medication Dose Route Frequency Provider Last Rate Last Admin   diphenhydrAMINE (BENADRYL) 50 MG/ML injection            famotidine (PEPCID) 20-0.9 MG/50ML-% IVPB            heparin lock flush 100 UNIT/ML injection            heparin lock flush 100 UNIT/ML injection            heparin lock flush 100 unit/mL  500 Units Intravenous Once Grayland Ormond, Kathlene November, MD       heparin lock flush 100 unit/mL  500 Units Intracatheter  Once PRN Lloyd Huger, MD       palonosetron (ALOXI) 0.25 MG/5ML injection            pembrolizumab (KEYTRUDA) 200 mg in sodium chloride 0.9 % 50 mL chemo infusion  200 mg Intravenous Once Lloyd Huger, MD 116 mL/hr at 02/11/22 1026 200 mg at 02/11/22 1026   sodium chloride flush (NS) 0.9 % injection 10 mL  10 mL Intravenous PRN Lloyd Huger, MD   10 mL at 01/06/21 1305    OBJECTIVE: Vitals:   02/11/22 0922  BP: 136/73  Pulse: 69  Resp: 18  Temp: (!) 97.1 F (36.2 C)  SpO2: 98%      Body mass index is  28.35 kg/m.    ECOG FS:0 - Asymptomatic  General: Well-developed, well-nourished, no acute distress. Eyes: Pink conjunctiva, anicteric sclera. HEENT: Trach in place.  Bilateral neck masses appear smaller. Lungs: No audible wheezing or coughing. Heart: Regular rate and rhythm. Abdomen: Soft, nontender, no obvious distention. Musculoskeletal: No edema, cyanosis, or clubbing. Neuro: Alert, answering all questions appropriately. Cranial nerves grossly intact. Skin: No rashes or petechiae noted. Psych: Normal affect.   LAB RESULTS:  Lab Results  Component Value Date   NA 138 02/11/2022   K 3.7 02/11/2022   CL 103 02/11/2022   CO2 25 02/11/2022   GLUCOSE 108 (H) 02/11/2022   BUN 14 02/11/2022   CREATININE 1.04 02/11/2022   CALCIUM 9.1 02/11/2022   PROT 8.0 02/11/2022   ALBUMIN 3.9 02/11/2022   AST 27 02/11/2022   ALT 18 02/11/2022   ALKPHOS 40 02/11/2022   BILITOT 0.5 02/11/2022   GFRNONAA >60 02/11/2022   GFRAA 37 (L) 11/13/2019    Lab Results  Component Value Date   WBC 7.7 02/11/2022   NEUTROABS 6.7 02/11/2022   HGB 12.2 (L) 02/11/2022   HCT 38.0 (L) 02/11/2022   MCV 95.2 02/11/2022   PLT 355 02/11/2022     STUDIES: No results found.  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  PLAN:    1.  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  Patient received 5 cycles of carboplatinum, Taxol, and Keytruda treatment completing on November 25, 2021.  Patient reinitiated 10 mg daily lenvatinib on February 04, 2022.  We will reinitiate Keytruda every 3 weeks today. Patient expressed understanding that his treatment options are limited and hospice and comfort care have been discussed, but he does not wish to pursue that at this time.  Return to clinic in 3 weeks for further evaluation and continuation of treatment.  2.  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  PET scan results from November 12, 2019 reviewed independently with no obvious evidence of recurrent or progressive disease.  Level 2 lymph node is no longer hypermetabolic.  3.  Renal insufficiency: Resolved. 4.  Anemia: Hemoglobin mildly improved to 12.2.   5.  Hypokalemia: This.  Continue oral supplementation. 6.  Pulmonary embolism: Diagnosed in September 2022.  Continue Eliquis as prescribed.  Patient will require a minimum of 6 months of treatment. 7.  Leukopenia: Resolved. 8.  Peripheral neuropathy: Chronic and unchanged.  Possibly related to Taxol.  Discontinue Taxol as above.   9.  Trach: Patient seen at [facility] in mid October with replacement of his trach.     Patient expressed understanding and was in agreement with this plan. He also understands that He can call clinic at any time with any questions, concerns, or complaints.    Cancer Staging  Malignant neoplasm of supraglottis [facility]) Staging form: Larynx - Supraglottis, AJCC 8th Edition - Clinical stage from 12/11/2020: Stage IVC (cT3, cN2b, cM1) - Signed by Lloyd Huger, MD on 01/26/2021  Primary squamous cell carcinoma of tonsil ([place]) Staging form: Pharynx - HPV-Mediated Oropharynx, AJCC 8th Edition - Clinical: No stage assigned -  Unsigned   Lloyd Huger, MD   02/11/2022 10:53 AM

## 2022-02-15 ENCOUNTER — Encounter (INDEPENDENT_AMBULATORY_CARE_PROVIDER_SITE_OTHER): Payer: Self-pay

## 2022-02-18 ENCOUNTER — Other Ambulatory Visit: Payer: Self-pay | Admitting: *Deleted

## 2022-02-19 MED ORDER — HYDROCODONE-ACETAMINOPHEN 5-325 MG PO TABS: 1.0000 | ORAL_TABLET | Freq: Four times a day (QID) | ORAL | 0 refills | Status: DC | PRN

## 2022-02-24 ENCOUNTER — Ambulatory Visit: Payer: Medicare Other

## 2022-02-24 ENCOUNTER — Other Ambulatory Visit: Payer: Medicare Other

## 2022-02-24 ENCOUNTER — Ambulatory Visit: Payer: Medicare Other | Admitting: Oncology

## 2022-03-04 ENCOUNTER — Inpatient Hospital Stay (HOSPITAL_BASED_OUTPATIENT_CLINIC_OR_DEPARTMENT_OTHER): Payer: Medicare Other | Admitting: Oncology

## 2022-03-04 ENCOUNTER — Inpatient Hospital Stay: Payer: Medicare Other

## 2022-03-04 ENCOUNTER — Other Ambulatory Visit: Payer: Self-pay

## 2022-03-04 ENCOUNTER — Inpatient Hospital Stay: Payer: Medicare Other | Attending: Oncology

## 2022-03-04 ENCOUNTER — Inpatient Hospital Stay: Payer: Medicare Other | Admitting: Pharmacist

## 2022-03-04 VITALS — BP 70/50 | HR 109 | Temp 98.7°F | Resp 24

## 2022-03-04 DIAGNOSIS — Z7901 Long term (current) use of anticoagulants: Secondary | ICD-10-CM | POA: Insufficient documentation

## 2022-03-04 DIAGNOSIS — C099 Malignant neoplasm of tonsil, unspecified: Secondary | ICD-10-CM

## 2022-03-04 DIAGNOSIS — I2699 Other pulmonary embolism without acute cor pulmonale: Secondary | ICD-10-CM | POA: Insufficient documentation

## 2022-03-04 DIAGNOSIS — D649 Anemia, unspecified: Secondary | ICD-10-CM | POA: Insufficient documentation

## 2022-03-04 DIAGNOSIS — Z930 Tracheostomy status: Secondary | ICD-10-CM | POA: Insufficient documentation

## 2022-03-04 DIAGNOSIS — Z87891 Personal history of nicotine dependence: Secondary | ICD-10-CM | POA: Diagnosis not present

## 2022-03-04 DIAGNOSIS — G629 Polyneuropathy, unspecified: Secondary | ICD-10-CM | POA: Insufficient documentation

## 2022-03-04 DIAGNOSIS — G893 Neoplasm related pain (acute) (chronic): Secondary | ICD-10-CM

## 2022-03-04 DIAGNOSIS — E876 Hypokalemia: Secondary | ICD-10-CM | POA: Diagnosis not present

## 2022-03-04 DIAGNOSIS — C321 Malignant neoplasm of supraglottis: Secondary | ICD-10-CM

## 2022-03-04 DIAGNOSIS — Z79891 Long term (current) use of opiate analgesic: Secondary | ICD-10-CM | POA: Insufficient documentation

## 2022-03-04 DIAGNOSIS — R5383 Other fatigue: Secondary | ICD-10-CM | POA: Diagnosis not present

## 2022-03-04 DIAGNOSIS — I10 Essential (primary) hypertension: Secondary | ICD-10-CM | POA: Insufficient documentation

## 2022-03-04 MED ORDER — OXYCODONE HCL 10 MG PO TABS: 10.0000 mg | ORAL_TABLET | ORAL | 0 refills | Status: DC

## 2022-03-04 MED ORDER — OXYCODONE HCL 5 MG PO TABS
10.0000 mg | ORAL_TABLET | Freq: Once | ORAL | Status: AC
  Administered 2022-03-04: 10 mg via ORAL
  Filled 2022-03-04: qty 2

## 2022-03-04 MED ORDER — OXYCODONE HCL 10 MG PO TABS: 10.0000 mg | ORAL_TABLET | ORAL | 0 refills | Status: AC

## 2022-03-04 NOTE — Progress Notes (Signed)
Can't eat or drink very much. Having severe excruciating back pain. Hypotensive BP 70/50. Mucus production is less around his trach. Rapid respirations. Hot and cold , hot and cold. Feeling very weak. Much difficulty getting in and out car. R arm becoming flaccid with no feeling. Treatment making him significantly worse. Has not taken chemo pill in 3-4 days. Rash is getting worse as well.

## 2022-03-04 NOTE — Progress Notes (Signed)
Patient not seen today. Not currently taking lenvatinib.

## 2022-03-05 ENCOUNTER — Encounter: Payer: Self-pay | Admitting: Oncology

## 2022-03-05 ENCOUNTER — Ambulatory Visit
Admission: RE | Admit: 2022-03-05 | Discharge: 2022-03-05 | Disposition: A | Discharge status: Home / Self Care | Payer: Medicare Other | Source: Ambulatory Visit | Attending: Oncology | Admitting: Oncology

## 2022-03-05 ENCOUNTER — Other Ambulatory Visit: Payer: Self-pay | Admitting: Oncology

## 2022-03-05 DIAGNOSIS — C321 Malignant neoplasm of supraglottis: Secondary | ICD-10-CM

## 2022-03-05 MED ORDER — GADOBUTROL 1 MMOL/ML IV SOLN: 8.0000 mL | Freq: Once | INTRAVENOUS | Status: DC | PRN

## 2022-03-05 MED ORDER — LORAZEPAM 1 MG PO TABS: 1.0000 mg | ORAL_TABLET | Freq: Once | ORAL | 0 refills | Status: AC

## 2022-03-05 NOTE — Progress Notes (Signed)
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  INTERVAL HISTORY: Patient returns to clinic today for further evaluation and continuation of Keytruda and lenvatinib.  Over the past week, patient's performance status is significantly declined with minimal p.o. intake, increasing weakness and fatigue, new onset lumbar back pain, as well as new onset right hand weakness and numbness.  He has no other neurologic complaints.  He has no chest pain, shortness of breath, cough, or hemoptysis.  He denies any nausea, vomiting, constipation, or diarrhea.  He has no urinary complaints.  Patient feels generally terrible, but offers no further specific complaints today.  REVIEW OF SYSTEMS:   Review of Systems  Constitutional:  Positive for malaise/fatigue. Negative for fever and weight loss.  Respiratory: Negative.  Negative for cough, hemoptysis and shortness of breath.   Cardiovascular: Negative.  Negative for chest pain and leg swelling.  Gastrointestinal: Negative.  Negative for abdominal pain.  Genitourinary: Negative.  Negative for dysuria.  Musculoskeletal:  Positive for back pain and neck pain.  Skin: Negative.  Negative for itching and rash.  Neurological:  Positive for tingling, focal weakness and weakness. Negative for dizziness and headaches.  Psychiatric/Behavioral: Negative.  The patient is not nervous/anxious.     As per HPI. Otherwise, a complete review of systems is negative.  PAST MEDICAL HISTORY: Past Medical History:  Diagnosis Date   Cancer [facility])     Coronary artery disease    Hypertension     PAST SURGICAL HISTORY: Past Surgical History:  Procedure Laterality Date   CORONARY ARTERY BYPASS GRAFT  2010   Quadruple   DIRECT LARYNGOSCOPY  11/28/2020   Procedure: DIRECT LARYNGOSCOPY WITH BIOPSY;  Surgeon: Scott Gust, MD;  Location: [facility] ORS;  Service: ENT;;   IR IMAGING GUIDED PORT INSERTION  01/01/2021   PULMONARY THROMBECTOMY Bilateral 01/13/2021   Procedure: PULMONARY THROMBECTOMY;  Surgeon: Scott Cabal, MD;  Location: [place] CV LAB;  Service: Cardiovascular;  Laterality: Bilateral;   TRACHEOSTOMY TUBE PLACEMENT N/A 11/28/2020   Procedure: AWAKE TRACHEOSTOMY;  Surgeon: Scott Gust, MD;  Location: [facility] ORS;  Service: ENT;  Laterality: N/A;    FAMILY HISTORY: Family History  Problem Relation Age of Onset   Arthritis Mother    Heart attack Father    Brain cancer Sister     ADVANCED DIRECTIVES (Y/N):  N  HEALTH MAINTENANCE: Social History   Tobacco Use   Smoking status: Former    Types: Cigarettes    Quit date: 10/17/2008    Years since quitting: 13.3   Smokeless tobacco: Never  Vaping Use   Vaping Use: Never used  Substance Use Topics   Alcohol use: Yes    Comment: occasionally   Drug use: Never     Colonoscopy:  PAP:  Bone density:  Lipid panel:  No Known Allergies  Current Outpatient Medications  Medication Sig Dispense Refill   gentamicin ointment (GARAMYCIN) 0.1 % Apply topically.     ALPRAZolam (XANAX) 0.5 MG tablet Take 1 tablet (0.5 mg total) by mouth daily as needed for anxiety (Take 1 hour prior to radiation). (Patient not taking: Reported on 04/14/2021) 30 tablet 1  amLODipine (NORVASC) 10 MG tablet Take 10 mg by mouth daily. (Patient not taking: Reported on 03/04/2022)     apixaban (ELIQUIS) 5 MG TABS tablet Take 1 tablet (5 mg total) by mouth 2 (two) times daily. (Patient not taking: Reported on 03/04/2022) 60 tablet 3   ascorbic acid (VITAMIN C) 500 MG tablet Take by mouth.  (Patient not taking: Reported on 03/04/2022)     aspirin 81 MG chewable tablet Chew by mouth. (Patient not taking: Reported on 11/25/2021)     betamethasone valerate ointment (VALISONE) 0.1 % Apply 1 application. topically 2 (two) times daily. For up to 2 weeks (Patient not taking: Reported on 09/23/2021) 30 g 0   clindamycin (CLEOCIN) 300 MG capsule Take 300 mg by mouth 3 (three) times daily.     cyanocobalamin 1000 MCG tablet Take by mouth. (Patient not taking: Reported on 03/04/2022)     DENTA 5000 PLUS 1.1 % CREA dental cream  (Patient not taking: Reported on 03/04/2022)     hydrALAZINE (APRESOLINE) 50 MG tablet Take 50 mg by mouth in the morning and at bedtime. 1.5 tab BID (Patient not taking: Reported on 03/04/2022)     ipratropium-albuterol (DUONEB) 0.5-2.5 (3) MG/3ML SOLN Take 3 mLs by nebulization every 6 (six) hours. (Patient not taking: Reported on 03/04/2022) 360 mL 0   lenvatinib 10 mg daily dose (LENVIMA, 10 MG DAILY DOSE,) capsule Take 1 capsule (10 mg total) by mouth daily. (Patient not taking: Reported on 03/04/2022) 30 capsule 3   metoprolol succinate (TOPROL-XL) 25 MG 24 hr tablet Take 25 mg by mouth daily. (Patient not taking: Reported on 03/04/2022)     olmesartan (BENICAR) 40 MG tablet Take 40 mg by mouth daily. (Patient not taking: Reported on 03/04/2022)     Oxycodone HCl 10 MG TABS Take 1 tablet (10 mg total) by mouth every 4 (four) hours. 90 tablet 0   predniSONE (DELTASONE) 5 MG tablet Take 1 tablet (5 mg total) by mouth daily with breakfast. (Patient not taking: Reported on 03/04/2022) 30 tablet 2   prochlorperazine (COMPAZINE) 10 MG tablet Take 1 tablet (10 mg total) by mouth every 6 (six) hours as needed for nausea or vomiting. (Patient not taking: Reported on 03/04/2022) 30 tablet 2   rosuvastatin (CRESTOR) 40 MG tablet Take 40 mg by mouth daily. (Patient not taking: Reported on 03/04/2022)     No current facility-administered medications for this visit.    Facility-Administered Medications Ordered in Other Visits  Medication Dose Route Frequency Provider Last Rate Last Admin   diphenhydrAMINE (BENADRYL) 50 MG/ML injection            famotidine (PEPCID) 20-0.9 MG/50ML-% IVPB            heparin lock flush 100 UNIT/ML injection            heparin lock flush 100 UNIT/ML injection            heparin lock flush 100 unit/mL  500 Units Intravenous Once Scott Huger, MD       palonosetron (ALOXI) 0.25 MG/5ML injection            sodium chloride flush (NS) 0.9 % injection 10 mL  10 mL Intravenous PRN Scott Huger, MD   10 mL at 01/06/21 1305    OBJECTIVE: Vitals:   03/04/22 1438  BP: (!) 70/50  Pulse: (!) 109  Resp: (!) 24  Temp: 98.7 F (37.1 C)  SpO2: 98%      There is  no height or weight on file to calculate BMI.    ECOG FS:0 - Asymptomatic  General: Well-developed, well-nourished, no acute distress. Eyes: Pink conjunctiva, anicteric sclera. HEENT: Normocephalic, moist mucous membranes.  Trach in place.  Mass appears decreased in size. Lungs: No audible wheezing or coughing. Heart: Regular rate and rhythm. Abdomen: Soft, nontender, no obvious distention. Musculoskeletal: No edema, cyanosis, or clubbing.  1 out of 5 strength in right hand. Neuro: Alert, answering all questions appropriately. Cranial nerves grossly intact. Skin: No rashes or petechiae noted. Psych: Normal affect.   LAB RESULTS:  Lab Results  Component Value Date   NA 138 02/11/2022   K 3.7 02/11/2022   CL 103 02/11/2022   CO2 25 02/11/2022   GLUCOSE 108 (H) 02/11/2022   BUN 14 02/11/2022   CREATININE 1.04 02/11/2022   CALCIUM 9.1 02/11/2022   PROT 8.0 02/11/2022   ALBUMIN 3.9 02/11/2022   AST 27 02/11/2022   ALT 18 02/11/2022   ALKPHOS 40 02/11/2022   BILITOT 0.5 02/11/2022   GFRNONAA >60 02/11/2022   GFRAA 37 (L) 11/13/2019    Lab Results  Component Value Date   WBC 7.7 02/11/2022   NEUTROABS 6.7 02/11/2022   HGB 12.2 (L)  02/11/2022   HCT 38.0 (L) 02/11/2022   MCV 95.2 02/11/2022   PLT 355 02/11/2022     STUDIES: No results found.  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  PLAN:    1.  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  Patient received 5 cycles of carboplatinum, Taxol, and Keytruda treatment completing on November 25, 2021.  Patient reinitiated 10 mg daily lenvatinib on February 04, 2022.  He also reinitiated Keytruda 3 weeks ago.  Given his progressive symptoms and declining performance status, will hold treatment at this time.  Patient will have a stat MRI and return to clinic on Tuesday for further evaluation.  Previously, patient expressed understanding that his treatment options are limited and hospice and comfort care have been discussed, but he does not wish to pursue that at this time.    2.  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  PET scan results from November 12, 2019 reviewed independently with no obvious evidence of recurrent or progressive disease.  Level 2 lymph node is no longer hypermetabolic.  3.  Renal insufficiency: Resolved. 4.  Anemia: Chronic and unchanged. 5.  Hypokalemia: Continue oral supplementation. 6.  Pulmonary embolism: Diagnosed in September 2022.  Continue Eliquis as prescribed.  Patient will require a minimum of 6 months of treatment. 7.  Leukopenia: Resolved. 8.  Peripheral neuropathy: Chronic and unchanged.  Possibly related to Taxol.  Discontinue Taxol as above.   9.  Trach: Patient seen by ENT earlier today.  Has an appointment next Monday to replace his trach. 10.  Right hand weakness/new onset lumbar pain: Will get cervical, thoracic, and lumbar MRI to further evaluate.  Return to clinic in 3  to 4 days as above. 11.  Pain: Patient was given a prescription for oxycodone today.   Patient expressed understanding and was in agreement with this plan. He also understands that He can call clinic at any time with any questions, concerns, or complaints.    Cancer Staging  Malignant neoplasm of supraglottis [facility]) Staging form:  Larynx - Supraglottis, AJCC 8th Edition - Clinical stage from 12/11/2020: Stage IVC (cT3, cN2b, cM1) - Signed by Scott Huger, MD on 01/26/2021  Primary squamous cell carcinoma of tonsil ([place]) Staging form: Pharynx - HPV-Mediated Oropharynx, AJCC 8th Edition - Clinical: No stage assigned - Unsigned   Scott Huger, MD   03/05/2022 6:39 AM

## 2022-03-08 ENCOUNTER — Ambulatory Visit
Admission: RE | Admit: 2022-03-08 | Discharge: 2022-03-08 | Disposition: A | Discharge status: Home / Self Care | Payer: Medicare Other | Source: Ambulatory Visit | Attending: Oncology | Admitting: Oncology

## 2022-03-08 DIAGNOSIS — C321 Malignant neoplasm of supraglottis: Secondary | ICD-10-CM | POA: Diagnosis present

## 2022-03-08 MED ORDER — HEPARIN SOD (PORK) LOCK FLUSH 100 UNIT/ML IV SOLN
500.0000 [IU] | Freq: Once | INTRAVENOUS | Status: DC
  Filled 2022-03-08: qty 5

## 2022-03-08 MED ORDER — GADOBUTROL 1 MMOL/ML IV SOLN
8.0000 mL | Freq: Once | INTRAVENOUS | Status: AC | PRN
  Administered 2022-03-08: 8 mL via INTRAVENOUS

## 2022-03-08 MED ORDER — HEPARIN SOD (PORK) LOCK FLUSH 100 UNIT/ML IV SOLN
INTRAVENOUS | Status: AC
  Administered 2022-03-08: 500 [IU]
  Filled 2022-03-08: qty 5

## 2022-03-09 ENCOUNTER — Inpatient Hospital Stay: Payer: Medicare Other | Admitting: Oncology

## 2022-03-09 ENCOUNTER — Inpatient Hospital Stay: Payer: Medicare Other

## 2022-03-09 ENCOUNTER — Other Ambulatory Visit: Payer: Self-pay

## 2022-03-09 ENCOUNTER — Inpatient Hospital Stay (HOSPITAL_BASED_OUTPATIENT_CLINIC_OR_DEPARTMENT_OTHER): Payer: Medicare Other | Admitting: Oncology

## 2022-03-09 DIAGNOSIS — C321 Malignant neoplasm of supraglottis: Secondary | ICD-10-CM | POA: Diagnosis not present

## 2022-03-09 MED ORDER — FENTANYL 25 MCG/HR TD PT72: 1.0000 | MEDICATED_PATCH | TRANSDERMAL | 0 refills | Status: DC

## 2022-03-09 MED ORDER — FENTANYL 25 MCG/HR TD PT72
1.0000 | MEDICATED_PATCH | TRANSDERMAL | 0 refills | Status: AC
  Filled 2022-03-09: qty 10, 30d supply, fill #0

## 2022-03-09 NOTE — Progress Notes (Signed)
[place]  [phone] [phone]  ID: Scott Harding OB: 02/10/1956  MR#: [id number]  URK#:[id number]  Patient Care Team: Idelle Crouch, MD as PCP - General (Internal Medicine) Beverly Gust, MD (Otolaryngology) Lloyd Huger, MD as Consulting Physician (Oncology) Noreene Filbert, MD as Referring Physician (Radiation Oncology)  I connected with Scott Harding on 03/10/22 at  1:00 PM EST by video enabled telemedicine visit and verified that I am speaking with the correct person using two identifiers.   I discussed the limitations, risks, security and privacy concerns of performing an evaluation and management service by telemedicine and the availability of in-person appointments. I also discussed with the patient that there may be a patient responsible charge related to this service. The patient expressed understanding and agreed to proceed.   Other persons participating in the visit and their role in the encounter: Patient, patient's daughter, MD.  Patient's location: Home. Provider's location: Clinic.  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  INTERVAL HISTORY: Patient agreed to video assisted telemedicine visit for further evaluation and discussion of his MRI results.  Patient has increased lethargy and confusion possibly secondary to increased dose of narcotics and much of the history is given by his daughter.  He continues to be in significant pain, but this has improved mildly.  His right arm strength and numbness has also improved.  His performance status continues to decrease and he has minimal p.o. intake with increased weakness and fatigue.  He denies any fevers.  He has no other neurologic complaints.  He has no chest pain, shortness of breath, cough, or hemoptysis.  He denies any nausea, vomiting, constipation, or diarrhea.  He has no urinary complaints.  Patient feels  generally terrible, but offers no further specific complaints today.  REVIEW OF SYSTEMS:   Review of Systems  Constitutional:  Positive for malaise/fatigue. Negative for fever and weight loss.  Respiratory: Negative.  Negative for cough, hemoptysis and shortness of breath.   Cardiovascular: Negative.  Negative for chest pain and leg swelling.  Gastrointestinal: Negative.  Negative for abdominal pain.  Genitourinary: Negative.  Negative for dysuria.  Musculoskeletal:  Positive for back pain and neck pain.  Skin: Negative.  Negative for itching and rash.  Neurological:  Positive for tingling, focal weakness and weakness. Negative for dizziness and headaches.  Psychiatric/Behavioral: Negative.  The patient is not nervous/anxious.     As per HPI. Otherwise, a complete review of systems is negative.  PAST MEDICAL HISTORY: Past Medical History:  Diagnosis Date   Cancer [facility])    Coronary artery disease    Hypertension     PAST SURGICAL HISTORY: Past Surgical History:  Procedure Laterality Date   CORONARY ARTERY BYPASS GRAFT  2010   Quadruple   DIRECT LARYNGOSCOPY  11/28/2020   Procedure: DIRECT LARYNGOSCOPY WITH BIOPSY;  Surgeon: Beverly Gust, MD;  Location: [facility] ORS;  Service: ENT;;   IR IMAGING GUIDED PORT INSERTION  01/01/2021   PULMONARY THROMBECTOMY Bilateral 01/13/2021   Procedure: PULMONARY THROMBECTOMY;  Surgeon: Katha Cabal, MD;  Location: [facility];  Service: Cardiovascular;  Laterality: Bilateral;   TRACHEOSTOMY TUBE PLACEMENT N/A 11/28/2020   Procedure: AWAKE TRACHEOSTOMY;  Surgeon: Beverly Gust, MD;  Location: [facility] ORS;  Service: ENT;  Laterality: N/A;    FAMILY HISTORY: Family History  Problem Relation Age of Onset   Arthritis Mother    Heart attack Father    Brain cancer Sister  ADVANCED DIRECTIVES (Y/N):  N  HEALTH MAINTENANCE: Social History   Tobacco Use   Smoking status: Former    Types: Cigarettes    Quit date: 10/17/2008     Years since quitting: 13.4   Smokeless tobacco: Never  Vaping Use   Vaping Use: Never used  Substance Use Topics   Alcohol use: Yes    Comment: occasionally   Drug use: Never     Colonoscopy:  PAP:  Bone density:  Lipid panel:  No Known Allergies  Current Outpatient Medications  Medication Sig Dispense Refill   ALPRAZolam (XANAX) 0.5 MG tablet Take 1 tablet (0.5 mg total) by mouth daily as needed for anxiety (Take 1 hour prior to radiation). (Patient not taking: Reported on 04/14/2021) 30 tablet 1   amLODipine (NORVASC) 10 MG tablet Take 10 mg by mouth daily. (Patient not taking: Reported on 03/04/2022)     apixaban (ELIQUIS) 5 MG TABS tablet Take 1 tablet (5 mg total) by mouth 2 (two) times daily. (Patient not taking: Reported on 03/04/2022) 60 tablet 3   ascorbic acid (VITAMIN C) 500 MG tablet Take by mouth. (Patient not taking: Reported on 03/04/2022)     aspirin 81 MG chewable tablet Chew by mouth. (Patient not taking: Reported on 11/25/2021)     betamethasone valerate ointment (VALISONE) 0.1 % Apply 1 application. topically 2 (two) times daily. For up to 2 weeks (Patient not taking: Reported on 09/23/2021) 30 g 0   clindamycin (CLEOCIN) 300 MG capsule Take 300 mg by mouth 3 (three) times daily.     cyanocobalamin 1000 MCG tablet Take by mouth. (Patient not taking: Reported on 03/04/2022)     DENTA 5000 PLUS 1.1 % CREA dental cream  (Patient not taking: Reported on 03/04/2022)     gentamicin ointment (GARAMYCIN) 0.1 % Apply topically.     hydrALAZINE (APRESOLINE) 50 MG tablet Take 50 mg by mouth in the morning and at bedtime. 1.5 tab BID (Patient not taking: Reported on 03/04/2022)     ipratropium-albuterol (DUONEB) 0.5-2.5 (3) MG/3ML SOLN Take 3 mLs by nebulization every 6 (six) hours. (Patient not taking: Reported on 03/04/2022) 360 mL 0   lenvatinib 10 mg daily dose (LENVIMA, 10 MG DAILY DOSE,) capsule Take 1 capsule (10 mg total) by mouth daily. (Patient not taking: Reported on  03/04/2022) 30 capsule 3   metoprolol succinate (TOPROL-XL) 25 MG 24 hr tablet Take 25 mg by mouth daily. (Patient not taking: Reported on 03/04/2022)     olmesartan (BENICAR) 40 MG tablet Take 40 mg by mouth daily. (Patient not taking: Reported on 03/04/2022)     Oxycodone HCl 10 MG TABS Take 1 tablet (10 mg total) by mouth every 4 (four) hours. 90 tablet 0   predniSONE (DELTASONE) 5 MG tablet Take 1 tablet (5 mg total) by mouth daily with breakfast. (Patient not taking: Reported on 03/04/2022) 30 tablet 2   prochlorperazine (COMPAZINE) 10 MG tablet Take 1 tablet (10 mg total) by mouth every 6 (six) hours as needed for nausea or vomiting. (Patient not taking: Reported on 03/04/2022) 30 tablet 2   rosuvastatin (CRESTOR) 40 MG tablet Take 40 mg by mouth daily. (Patient not taking: Reported on 03/04/2022)     No current facility-administered medications for this visit.   Facility-Administered Medications Ordered in Other Visits  Medication Dose Route Frequency Provider Last Rate Last Admin   diphenhydrAMINE (BENADRYL) 50 MG/ML injection            famotidine (PEPCID) 20-0.9 MG/50ML-%  IVPB            heparin lock flush 100 UNIT/ML injection            heparin lock flush 100 UNIT/ML injection            heparin lock flush 100 unit/mL  500 Units Intravenous Once Lloyd Huger, MD       palonosetron (ALOXI) 0.25 MG/5ML injection            sodium chloride flush (NS) 0.9 % injection 10 mL  10 mL Intravenous PRN Lloyd Huger, MD   10 mL at 01/06/21 1305    OBJECTIVE: There were no vitals filed for this visit.     There is no height or weight on file to calculate BMI.    ECOG FS:3 - Symptomatic, >50% confined to bed  General: Ill-appearing, no acute distress. HEENT: Normocephalic.  Trach in place. Neuro: Lethargic, mildly confused.  Cranial nerves grossly intact. Psych: Flat affect.  LAB RESULTS:  Lab Results  Component Value Date   NA 138 02/11/2022   K 3.7 02/11/2022   CL  103 02/11/2022   CO2 25 02/11/2022   GLUCOSE 108 (H) 02/11/2022   BUN 14 02/11/2022   CREATININE 1.04 02/11/2022   CALCIUM 9.1 02/11/2022   PROT 8.0 02/11/2022   ALBUMIN 3.9 02/11/2022   AST 27 02/11/2022   ALT 18 02/11/2022   ALKPHOS 40 02/11/2022   BILITOT 0.5 02/11/2022   GFRNONAA >60 02/11/2022   GFRAA 37 (L) 11/13/2019    Lab Results  Component Value Date   WBC 7.7 02/11/2022   NEUTROABS 6.7 02/11/2022   HGB 12.2 (L) 02/11/2022   HCT 38.0 (L) 02/11/2022   MCV 95.2 02/11/2022   PLT 355 02/11/2022     STUDIES: MR TOTAL SPINE METS SCREENING  Result Date: 03/08/2022 CLINICAL DATA:  metastatic disease Malignant neoplasm of supraglottis, back pain EXAM: MRI TOTAL SPINE WITHOUT AND WITH CONTRAST TECHNIQUE: Multisequence MR imaging of the spine from the cervical spine to the sacrum was performed prior to and following IV contrast administration for evaluation of spinal metastatic disease. CONTRAST:  8 mL of Gadavist IV COMPARISON:  None Available. FINDINGS: Limited screening protocol MRI with suggestion of enhancement in the left sacrum and ill-defined T1 hypointensity (for example see series 55, image 16 and series 50, image 16). Multilevel degenerative change with mild to moderate canal stenosis in the cervical spine. No high-grade stenosis. Posterior disc bulge at C6-C7 effaces ventral CSF. IMPRESSION: Limited screening protocol MRI with suggestion of enhancement in the left sacrum and ill-defined T1 hypointensity (for example see series 55, image 16 and series 50, image 16). This could potentially represent an atypical benign vertebral venous malformation (hemangioma) or metastasis given the clinical history. Follow-up MRI of the pelvis with contrast is recommended in approximately 3 months to further characterize and to evaluate stability. Electronically Signed   By: Margaretha Sheffield M.D.   On: 03/08/2022 12:49    ASSESSMENT: Recurrent stage IVc squamous cell carcinoma of the  supraglottis, p16 positive.  PLAN:    1.  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  Patient received 5 cycles of carboplatinum, Taxol, and Keytruda treatment completing on November 25, 2021.  Patient reinitiated 10 mg daily lenvatinib on February 04, 2022.  He also reinitiated Keytruda, but has not had treatment since February 11, 2022.  Given his progressive symptoms and declining performance status, will continue to hold treatment at this time.  Have adjusted patient's prednisone dose and pain medication in attempt to improve performance status.  Hospice and end-of-life care were discussed again today, but patient is not ready to make this decision.  Return to clinic in 1 week for further evaluation and discussion on whether to initiate hospice or continue treatment.  If additional treatment is pursued, patient will require repeat imaging.      2.  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  PET scan results from November 12, 2019 reviewed independently with no obvious evidence of recurrent or progressive disease.  Level 2 lymph node is no longer hypermetabolic.  3.  Renal insufficiency: Resolved. 4.  Anemia: Chronic and unchanged. 5.  Hypokalemia: Continue oral supplementation. 6.  Pulmonary embolism: Diagnosed in September 2022.  Continue Eliquis as prescribed.  Patient will require a minimum of 6 months of treatment. 7.  Leukopenia: Resolved. 8.  Peripheral neuropathy: Chronic and unchanged.  Possibly related to Taxol.  Discontinue Taxol as above.   9.  Trach: Continue follow-up with ENT for trach replacement. 10.  Right hand weakness/new onset lumbar pain: MRI results as above with no obvious etiology in the vertebral column.  Daughter reports this is mildly improved.  Continue  to symptomatically treat and repeat imaging as above if hospice is not pursued.   11.  Pain: Increase prednisone to 10 mg daily.  Patient was given a prescription for fentanyl patch 25 mcg and recommended to decrease oxycodone dose to minimize confusion and lethargy. 12.  Poor appetite: Increase prednisone dose as above.  I provided 30 minutes of face-to-face video visit time during this encounter which included chart review, counseling, and coordination of care as documented above.   Patient expressed understanding and was in agreement with this plan. He also understands that He can call clinic at any time with any questions, concerns, or complaints.    Cancer Staging  Malignant neoplasm of supraglottis [facility]) Staging form: Larynx - Supraglottis, AJCC 8th Edition - Clinical stage from 12/11/2020: Stage IVC (cT3, cN2b, cM1) - Signed by Lloyd Huger, MD on 01/26/2021  Primary squamous cell carcinoma of tonsil ([place]) Staging form: Pharynx - HPV-Mediated Oropharynx, AJCC 8th Edition - Clinical: No stage assigned - Unsigned   Lloyd Huger, MD   03/09/2022 1:15 PM

## 2022-03-10 ENCOUNTER — Encounter: Payer: Self-pay | Admitting: Oncology

## 2022-03-16 ENCOUNTER — Inpatient Hospital Stay: Payer: Medicare Other | Admitting: Oncology

## 2022-03-16 ENCOUNTER — Inpatient Hospital Stay: Payer: Medicare Other

## 2022-03-16 ENCOUNTER — Inpatient Hospital Stay: Payer: Medicare Other | Admitting: Hospice and Palliative Medicine

## 2022-03-17 ENCOUNTER — Other Ambulatory Visit: Payer: Medicare Other

## 2022-03-17 ENCOUNTER — Ambulatory Visit: Payer: Medicare Other | Admitting: Oncology

## 2022-03-17 ENCOUNTER — Ambulatory Visit: Payer: Medicare Other

## 2022-03-19 DEATH — deceased

## 2022-03-25 ENCOUNTER — Encounter: Payer: Medicare Other | Admitting: Hospice and Palliative Medicine

## 2022-03-25 ENCOUNTER — Other Ambulatory Visit: Payer: Medicare Other

## 2022-03-25 ENCOUNTER — Ambulatory Visit: Payer: Medicare Other | Admitting: Oncology

## 2022-03-25 ENCOUNTER — Ambulatory Visit: Payer: Medicare Other

## 2022-03-30 ENCOUNTER — Ambulatory Visit: Payer: Medicare Other

## 2022-03-30 ENCOUNTER — Other Ambulatory Visit: Payer: Medicare Other

## 2022-03-30 ENCOUNTER — Ambulatory Visit: Payer: Medicare Other | Admitting: Oncology

## 2022-04-15 ENCOUNTER — Ambulatory Visit: Payer: Medicare Other | Admitting: Oncology

## 2022-04-15 ENCOUNTER — Ambulatory Visit: Payer: Medicare Other

## 2022-04-15 ENCOUNTER — Other Ambulatory Visit: Payer: Medicare Other

## 2022-05-26 ENCOUNTER — Ambulatory Visit: Payer: Medicare Other | Admitting: Radiation Oncology

## 2023-04-11 IMAGING — CT NM PET TUM IMG RESTAG (PS) SKULL BASE T - THIGH
9 of 10 series · 17 of 25 positions shown · non-contrast
Comparison: PET-CT 12/16/2020

CLINICAL DATA: Subsequent treatment strategy for tonsillar
carcinoma.

EXAM:
NUCLEAR MEDICINE PET SKULL BASE TO THIGH
TECHNIQUE: 11.4 mCi F-18 FDG was injected intravenously. Full-ring PET imaging
was performed from the skull base to thigh after the radiotracer. CT
data was obtained and used for attenuation correction and anatomic
localization.
Fasting blood glucose: 93 mg/dl

[Series 3: ct wb 5.0 b30f · axial · 5.0mm · 0.98mm/px · z∈[-1250,-266]mm · 2 of 329 slices shown]
[im 1/329]
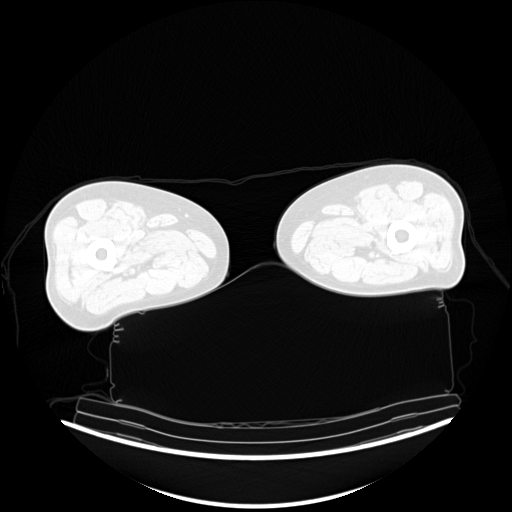
[im 329/329  brain]
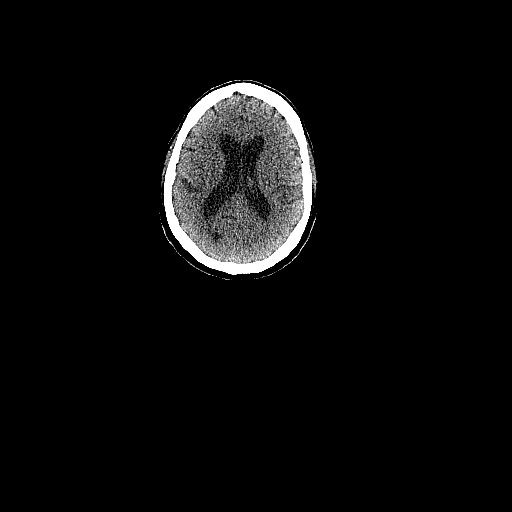

[Series 5: pet wb uncorrected (nac) · axial · 5.0mm · 4.07mm/px · z∈[-1250,-266]mm · 2 of 329 slices shown]
[im 1/329]
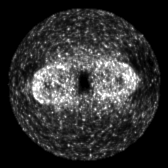
[im 329/329]
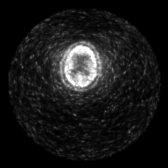

[Series 6: pet wb (ac) · axial · 5.0mm · 3.39mm/px · z∈[-1250,-266]mm · 3 of 329 slices shown]
[im 1/329]
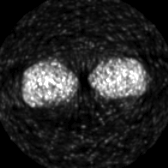
[im 219/329]
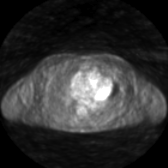
[im 329/329]
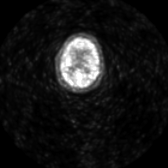

[Series 603: fused axial · 3 of 329 slices shown]
[im 110/329]
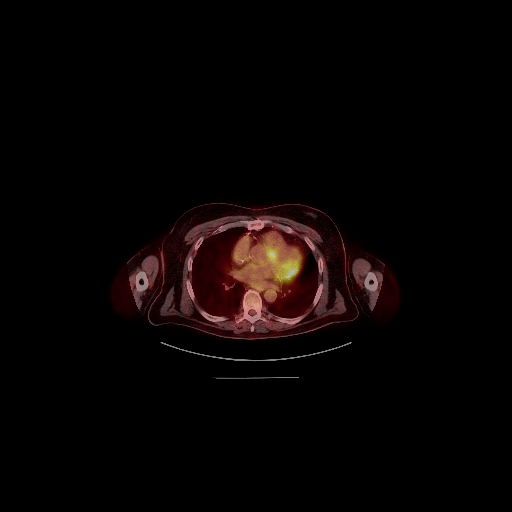
[im 219/329]
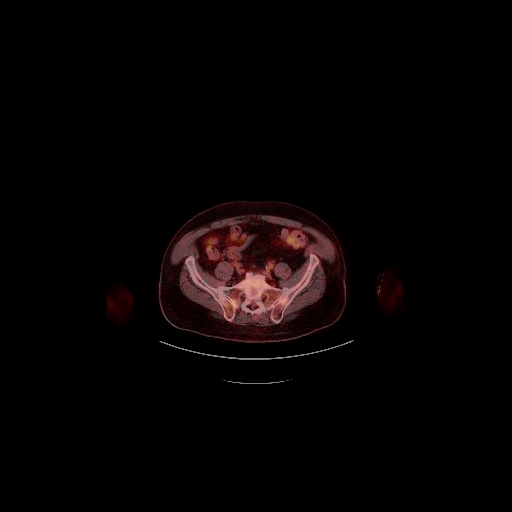
[im 329/329]
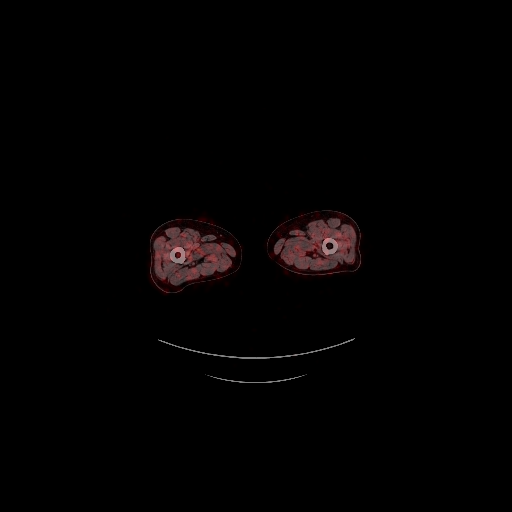

[Series 605: fused sagittal · 2 of 186 slices shown]
[im 1/186]
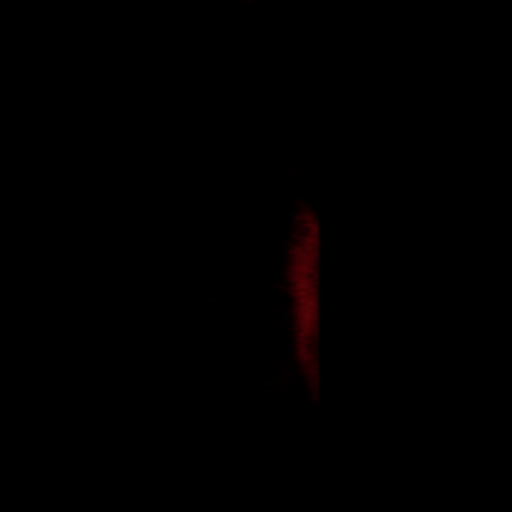
[im 186/186]
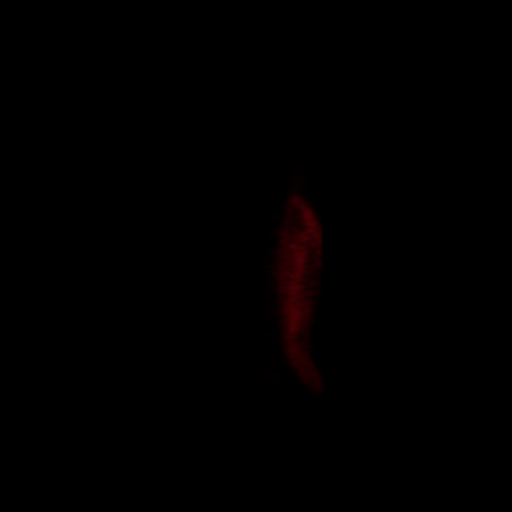

[Series 606: pet axial · 2 of 326 slices shown]
[im 109/326]
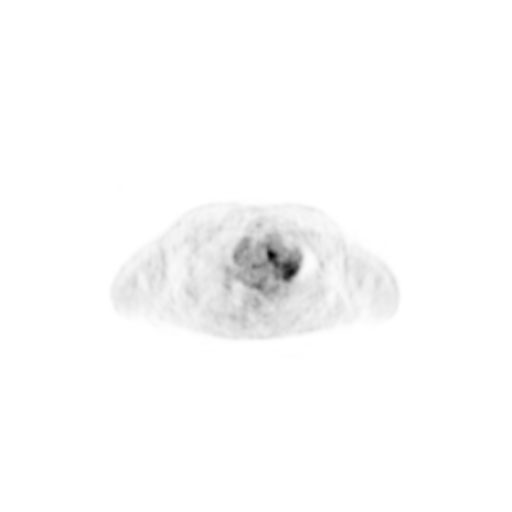
[im 217/326]
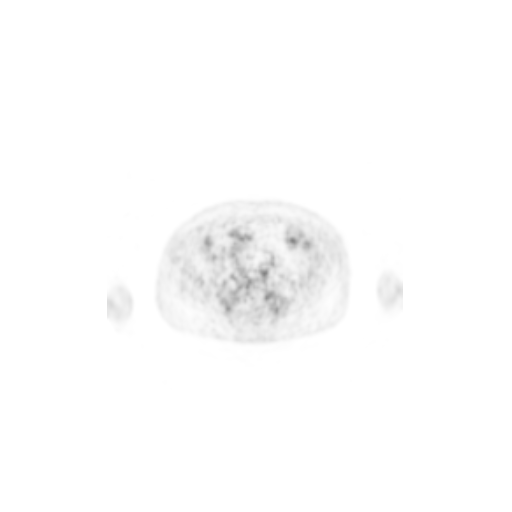

[Series 607: pet coronal · 1 of 125 slices shown]
[im 1/125]
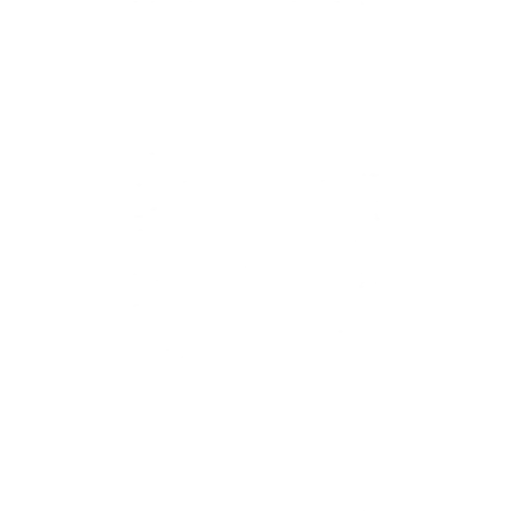

[Series 608: pet sagittal · 1 of 189 slices shown]
[im 1/189]
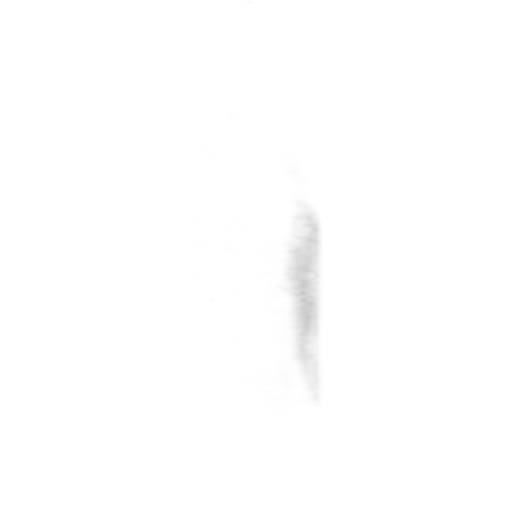

[Series 8465: results mm oncology reading · 5.0mm · 0.45mm/px · 1 of 1 slices shown]
[im 1/1]
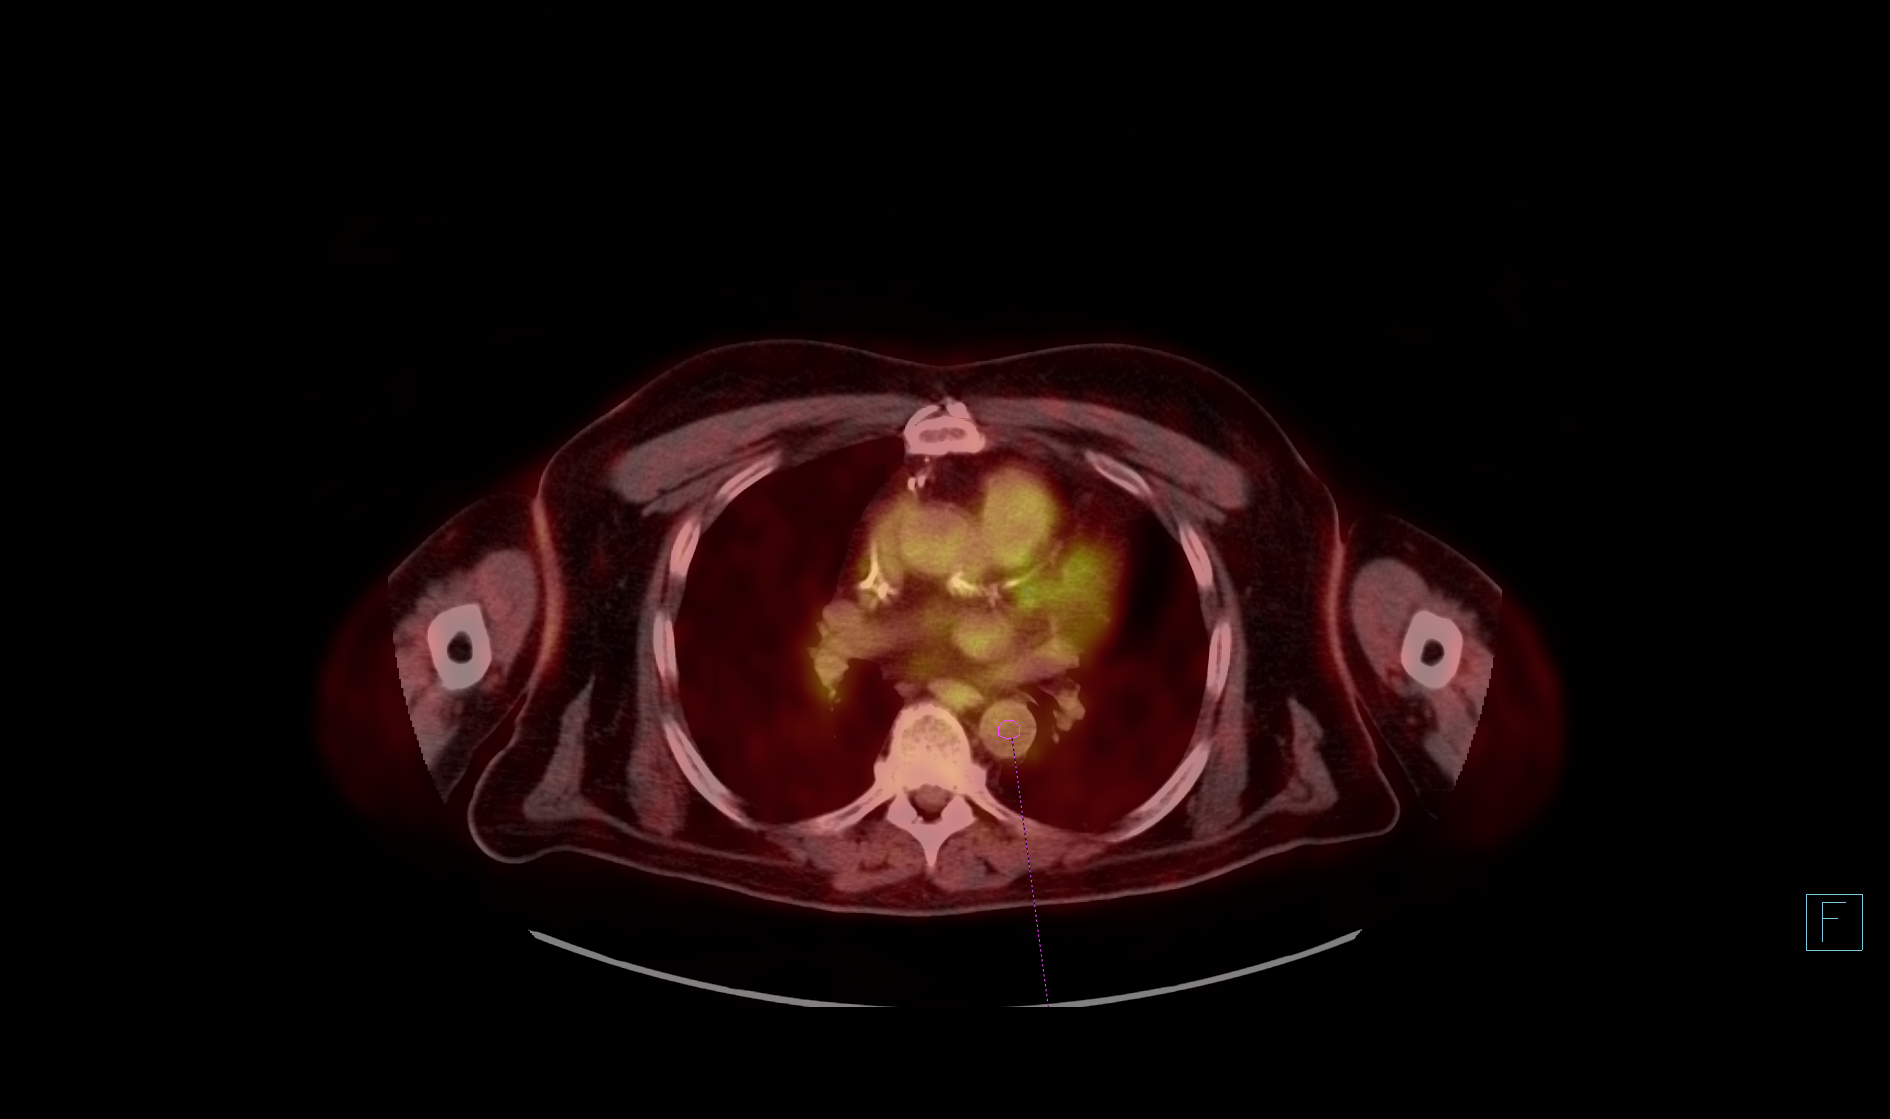

[17 of 25 positions shown; findings below may reference images not displayed]

FINDINGS: Mediastinal blood pool activity: SUV max

Liver activity: SUV max NA

NECK: Complete interval resolution of the large hypermetabolic
hypopharyngeal mass. Bilateral lymphadenopathy has also resolved.

Incidental CT findings: None

CHEST: Interval resolution of the hypermetabolic pulmonary
metastatic disease. No residual measurable pulmonary nodules are
identified.

No enlarged or hypermetabolic mediastinal or hilar lymph nodes. No
supraclavicular or axillary adenopathy.

Incidental CT findings: Stable age advanced vascular disease and
evidence of prior coronary artery bypass surgery.

ABDOMEN/PELVIS: No findings suspicious for abdominal/pelvic
metastatic disease.

Incidental CT findings: Stable small gallstones. Stable age advanced
vascular disease but no aneurysm.

SKELETON: No findings suspicious for osseous metastatic disease.

Incidental CT findings: none
IMPRESSION: 1. Complete metabolic response to treatment. The large
hypopharyngeal/tonsillar mass has completely resolved as has the
neck adenopathy and metastatic pulmonary nodules.
2. Stable age advanced vascular disease.
